# Patient Record
Sex: Female | Born: 1950 | Race: White | Hispanic: No | Smoking: Former smoker
Health system: Southern US, Community
[De-identification: ages and names within clinical notes are randomized; demographics above are authoritative.]

## PROBLEM LIST (undated history)

## (undated) DIAGNOSIS — J45909 Unspecified asthma, uncomplicated: Secondary | ICD-10-CM

## (undated) DIAGNOSIS — R42 Dizziness and giddiness: Secondary | ICD-10-CM

## (undated) DIAGNOSIS — E785 Hyperlipidemia, unspecified: Secondary | ICD-10-CM

## (undated) DIAGNOSIS — Z9289 Personal history of other medical treatment: Secondary | ICD-10-CM

## (undated) DIAGNOSIS — F329 Major depressive disorder, single episode, unspecified: Secondary | ICD-10-CM

## (undated) DIAGNOSIS — J392 Other diseases of pharynx: Secondary | ICD-10-CM

## (undated) DIAGNOSIS — I10 Essential (primary) hypertension: Secondary | ICD-10-CM

## (undated) DIAGNOSIS — M1990 Unspecified osteoarthritis, unspecified site: Secondary | ICD-10-CM

## (undated) DIAGNOSIS — Z8719 Personal history of other diseases of the digestive system: Secondary | ICD-10-CM

## (undated) DIAGNOSIS — F32A Depression, unspecified: Secondary | ICD-10-CM

## (undated) DIAGNOSIS — M25569 Pain in unspecified knee: Secondary | ICD-10-CM

## (undated) DIAGNOSIS — E66813 Obesity, class 3: Secondary | ICD-10-CM

## (undated) DIAGNOSIS — Z8619 Personal history of other infectious and parasitic diseases: Secondary | ICD-10-CM

## (undated) DIAGNOSIS — G4733 Obstructive sleep apnea (adult) (pediatric): Secondary | ICD-10-CM

## (undated) DIAGNOSIS — C541 Malignant neoplasm of endometrium: Secondary | ICD-10-CM

## (undated) DIAGNOSIS — Z8701 Personal history of pneumonia (recurrent): Secondary | ICD-10-CM

## (undated) DIAGNOSIS — M5430 Sciatica, unspecified side: Secondary | ICD-10-CM

## (undated) DIAGNOSIS — M25579 Pain in unspecified ankle and joints of unspecified foot: Secondary | ICD-10-CM

## (undated) DIAGNOSIS — K219 Gastro-esophageal reflux disease without esophagitis: Secondary | ICD-10-CM

## (undated) DIAGNOSIS — C50919 Malignant neoplasm of unspecified site of unspecified female breast: Secondary | ICD-10-CM

## (undated) DIAGNOSIS — J189 Pneumonia, unspecified organism: Secondary | ICD-10-CM

## (undated) DIAGNOSIS — F419 Anxiety disorder, unspecified: Secondary | ICD-10-CM

## (undated) DIAGNOSIS — Z8679 Personal history of other diseases of the circulatory system: Secondary | ICD-10-CM

## (undated) DIAGNOSIS — K759 Inflammatory liver disease, unspecified: Secondary | ICD-10-CM

## (undated) HISTORY — DX: Pain in unspecified knee: M25.569

## (undated) HISTORY — DX: Dizziness and giddiness: R42

## (undated) HISTORY — DX: Personal history of pneumonia (recurrent): Z87.01

## (undated) HISTORY — DX: Malignant neoplasm of endometrium: C54.1

## (undated) HISTORY — DX: Hyperlipidemia, unspecified: E78.5

## (undated) HISTORY — DX: Pain in unspecified ankle and joints of unspecified foot: M25.579

## (undated) HISTORY — DX: Morbid (severe) obesity due to excess calories: E66.01

## (undated) HISTORY — DX: Unspecified osteoarthritis, unspecified site: M19.90

## (undated) HISTORY — DX: Personal history of other infectious and parasitic diseases: Z86.19

## (undated) HISTORY — DX: Obstructive sleep apnea (adult) (pediatric): G47.33

## (undated) HISTORY — PX: COLONOSCOPY: SHX174

## (undated) HISTORY — PX: BREAST LUMPECTOMY: SHX2

## (undated) HISTORY — DX: Essential (primary) hypertension: I10

## (undated) HISTORY — PX: PORT-A-CATH REMOVAL: SHX5289

## (undated) HISTORY — PX: EYE SURGERY: SHX253

## (undated) HISTORY — DX: Malignant neoplasm of unspecified site of unspecified female breast: C50.919

## (undated) HISTORY — PX: PORTA CATH INSERTION: CATH118285

## (undated) HISTORY — DX: Personal history of other diseases of the circulatory system: Z86.79

## (undated) HISTORY — DX: Obesity, class 3: E66.813

---
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
## 1978-07-01 HISTORY — PX: TUBAL LIGATION: SHX77

## 2001-07-01 DIAGNOSIS — Z8719 Personal history of other diseases of the digestive system: Secondary | ICD-10-CM

## 2001-07-01 HISTORY — DX: Personal history of other diseases of the digestive system: Z87.19

## 2001-07-01 HISTORY — PX: HIATAL HERNIA REPAIR: SHX195

## 2004-07-01 HISTORY — PX: TOTAL KNEE ARTHROPLASTY: SHX125

## 2006-07-01 DIAGNOSIS — C50919 Malignant neoplasm of unspecified site of unspecified female breast: Secondary | ICD-10-CM

## 2006-07-01 HISTORY — PX: OTHER SURGICAL HISTORY: SHX169

## 2006-07-01 HISTORY — DX: Malignant neoplasm of unspecified site of unspecified female breast: C50.919

## 2009-07-01 DIAGNOSIS — Z8679 Personal history of other diseases of the circulatory system: Secondary | ICD-10-CM

## 2009-07-01 HISTORY — DX: Personal history of other diseases of the circulatory system: Z86.79

## 2011-08-01 ENCOUNTER — Encounter: Payer: Self-pay | Admitting: Orthopedic Surgery

## 2011-08-01 ENCOUNTER — Encounter (HOSPITAL_COMMUNITY): Payer: Self-pay | Admitting: Pharmacy Technician

## 2011-08-01 ENCOUNTER — Ambulatory Visit (INDEPENDENT_AMBULATORY_CARE_PROVIDER_SITE_OTHER): Admitting: Orthopedic Surgery

## 2011-08-01 ENCOUNTER — Other Ambulatory Visit: Payer: Self-pay | Admitting: *Deleted

## 2011-08-01 VITALS — Ht 66.0 in | Wt 235.0 lb

## 2011-08-01 DIAGNOSIS — M1710 Unilateral primary osteoarthritis, unspecified knee: Secondary | ICD-10-CM

## 2011-08-01 NOTE — Progress Notes (Signed)
Patient ID: Stephanie Jarvis, female   DOB: 24-Sep-1950, 61 y.o.   MRN: [id number] A separate x-ray report  3 views LEFT  Knee pain with arthritis  Alignment shows external rotation of the tibia severe varus deformity  Joint spaces the medial joint space is completely obliterated.  Other findings include subchondral sclerosis, cyst formation.  Osteophytes.  Impression severe osteoarthritis LEFT knee with varus and external rotation deformity and proximal tibia bone loss Subjective:    Stephanie Jarvis is a 61 y.o. female who presents with knee pain involving the left knee. Onset was > 2 years ago. Inciting event: none known. Current symptoms include: crepitus sensation, locking, pain located medial side, popping sensation, stiffness, swelling and catching. Pain is aggravated by any weight bearing, going up and down stairs, kneeling, rising after sitting, squatting, standing and walking. Patient has had prior knee problems. Evaluation to date: none. Treatment to date: Naprosyn, Celebrex and tramadol with decreasing effectiveness.  The following portions of the patient's history were reviewed and updated as appropriate: allergies, current medications, past family history, past medical history, past social history, past surgical history and problem list.   Review of Systems Cardiovascular: positive for palpitations Gastrointestinal: positive for dyspepsia Musculoskeletal:see hpi Behavioral/Psych: positive for anxiety and depression Allergic/Immunologic: seasonal allergies   Objective:    Ht 5\' 6"  (1.676 m)  Wt 235 lb (106.595 kg)  BMI 37.93 kg/m2  Physical Exam(12) GENERAL: normal development   CDV: pulses are normal   Skin: normal  Lymph: nodes were not palpable/normal  Psychiatric: awake, alert and oriented  Neuro: normal sensation  MSK ambulation with a cane Upper extremity exam  Inspection and palpation revealed no abnormalities in the upper extremities.  Range of motion  is full without contracture.  Motor exam is normal with grade 5 strength.  The joints are fully reduced without subluxation.  There is no atrophy or tremor and muscle tone is normal.  All joints are stable.     Right knee: knee flexion 102, alignment normal, no pain tenderness or swelling, strength is normal, knee is stable.  Left knee:  severe deformity, With external rotation and varus malalignment.  There is severe medial joint line tenderness.  Range of motion is 95.  Muscle tone and strength are normal.  Knee is stable.   X-ray LEFT knee shows severe varus deformity with proximal tibial bone loss and external rotation deformity of the tibia.  There is no evidence of any residual medial joint space.  There is osteophytes subchondral sclerosis and cyst formation.  Assessment:    Left knee osteoarthritis with severe medial and external rotation deformity associated with proximal bone loss    Plan:    Natural history and expected course discussed. Questions answered. she would like to proceed with knee replacement surgery.  We have discussed the risks and benefits of the procedure which is somewhat familiar with from her surgery 6 years ago but we reviewed again.  We also discussed the nature of her problem and deformity and difficulty with correction.

## 2011-08-01 NOTE — Patient Instructions (Signed)
You have been scheduled for surgery.  All surgeries carry some risk.  Remember you always have the option of continued nonsurgical treatment. However in this situation the risks vs. the benefits favor surgery as the best treatment option. The risks of the surgery includes the following but is not limited to bleeding, infection, pulmonary embolus, death from anesthesia, nerve injury vascular injury or need for further surgery, continued pain.  Specific to this procedure LEFT TOTAL KNEE REPLACEMENT  the following risks and complications are rare but possible Stiffness Pain  Infection which may require several subsequent surgeries including an amputation of the infection cannot be removed Instability

## 2011-08-02 ENCOUNTER — Encounter (HOSPITAL_COMMUNITY): Admission: RE | Admit: 2011-08-02 | Source: Ambulatory Visit

## 2011-08-06 ENCOUNTER — Other Ambulatory Visit: Payer: Self-pay

## 2011-08-06 ENCOUNTER — Encounter (HOSPITAL_COMMUNITY)
Admission: RE | Admit: 2011-08-06 | Discharge: 2011-08-06 | Disposition: A | Discharge status: Home / Self Care | Source: Ambulatory Visit | Attending: Orthopedic Surgery | Admitting: Orthopedic Surgery

## 2011-08-06 ENCOUNTER — Encounter (HOSPITAL_COMMUNITY): Payer: Self-pay

## 2011-08-06 HISTORY — DX: Gastro-esophageal reflux disease without esophagitis: K21.9

## 2011-08-06 HISTORY — DX: Personal history of other diseases of the digestive system: Z87.19

## 2011-08-06 LAB — CBC
Hemoglobin: 13.2 g/dL (ref 12.0–15.0)
MCHC: 34.2 g/dL (ref 30.0–36.0)
RBC: 4.19 MIL/uL (ref 3.87–5.11)

## 2011-08-06 LAB — BASIC METABOLIC PANEL
GFR calc Af Amer: >90 mL/min (ref >90)
GFR calc non Af Amer: >90 mL/min (ref >90)
Potassium: 4.1 mEq/L (ref 3.5–5.1)
Sodium: 138 mEq/L (ref 135–145)

## 2011-08-06 LAB — DIFFERENTIAL
Basophils Relative: 1 % (ref 0–1)
Eosinophils Absolute: 0.2 10*3/uL (ref 0.0–0.7)
Eosinophils Relative: 3 % (ref 0–5)
Lymphocytes Relative: 27 % (ref 12–46)
Neutrophils Relative %: 61 % (ref 43–77)

## 2011-08-06 LAB — ABO/RH: ABO/RH(D): O POS

## 2011-08-06 LAB — APTT: aPTT: 37 seconds (ref 24–37)

## 2011-08-06 LAB — PREPARE RBC (CROSSMATCH)

## 2011-08-06 LAB — SURGICAL PCR SCREEN: MRSA, PCR: NEGATIVE

## 2011-08-06 MED ORDER — CHLORHEXIDINE GLUCONATE 4 % EX LIQD
60.0000 mL | Freq: Once | CUTANEOUS | Status: DC
  Filled 2011-08-06: qty 60

## 2011-08-06 NOTE — Patient Instructions (Addendum)
20 JAYLEE FREEZE  08/06/2011   Your procedure is scheduled on:   08/12/2011  Report to [facility] at  855  AM.  Call this number if you have problems the morning of surgery: [phone]   Remember:   Do not eat food:After Midnight.  May have clear liquids:until Midnight .  Clear liquids include soda, tea, black coffee, apple or grape juice, broth.  Take these medicines the morning of surgery with A SIP OF WATER: bipropion,fexofenadine, metoprolol,omeprazole,tramdol   Do not wear jewelry, make-up or nail polish.  Do not wear lotions, powders, or perfumes. You may wear deodorant.  Do not shave 48 hours prior to surgery.  Do not bring valuables to the hospital.  Contacts, dentures or bridgework may not be worn into surgery.  Leave suitcase in the car. After surgery it may be brought to your room.  For patients admitted to the hospital, checkout time is 11:00 AM the day of discharge.   Patients discharged the day of surgery will not be allowed to drive home.  Name and phone number of your driver: family  Special Instructions: CHG Shower Use Special Wash: 1/2 bottle night before surgery and 1/2 bottle morning of surgery.   Please read over the following fact sheets that you were given: Pain Booklet, MRSA Information, Surgical Site Infection Prevention, Anesthesia Post-op Instructions and Care and Recovery After Surgery Total Knee Surgery Physical Therapy   Walking - Use a walker or crutches until your doctor says you no longer need them. Your foot should be flat on the floor with no weight/moderate weight/weight tolerated on it.  - Steps: Always go up with your stronger leg first, followed by operated leg and assistive device. To go down: assistive device first, operated leg, then stronger leg.   Positioning  - Do not wear the knee immobilizer splint during the day. To prevent stiffness, it is important to practice your bending exercises frequently (every one to two hours).  - Do not put  a pillow under your knee while in bed or sitting. This would eventually keep your knee from straightening.   Swelling  You can expect swelling in your knee and leg for a few months after surgery. To help reduce or prevent swelling, do the following:  - Elevate your ankle and knee above your heart. Do ankle pumps at least every hour.  - Put ice packs on operated knee. To make an ice pack, fill a plastic bag with ice, wrap a towel around your knee and put ice pack on top of the towel.  - Do not use creams such as Crista Elliot, [address], etc.   Exercise  - Follow exercise instructions given by your physical therapist. Continue working on exercise until you can straighten your knee completely and bend it to at least 90 degrees.  - Swimming may be started as soon as your incision heals. Be sure access into pool is a ramp or steps. Do not go up/down ladder or sit on side of pool.  - A stationary bike may be used 4 weeks after surgery, with no resistance on the pedals.   Activities -- Next Six to Eight Weeks - Do not drive until given permission by your doctor.  - You may shower once incision is healed.  - Avoid high-heeled shoes or slip-on slippers. Instead, wear good walking or tennis shoes.  - Avoid scatter rugs. Put them away while walking on crutches/walker.  - Avoid gaining excessive weight.   Additional  Activities (Six to Eight Weeks After Surgery) - Walking -- excellent exercise to help build strength and general conditioning.  - No tennis, jogging, or other sport that requires a lot of stop-start or jarring. These may loosen the prosthesis.  - Golf -- may begin after three months  - Swimming -- for general conditioning and endurance   PATIENT INSTRUCTIONS POST-ANESTHESIA  IMMEDIATELY FOLLOWING SURGERY:  Do not drive or operate machinery for the first twenty four hours after surgery.  Do not make any important decisions for twenty four hours after surgery or while taking narcotic pain  medications or sedatives.  If you develop intractable nausea and vomiting or a severe headache please notify your doctor immediately.  FOLLOW-UP:  Please make an appointment with your surgeon as instructed. You do not need to follow up with anesthesia unless specifically instructed to do so.  WOUND CARE INSTRUCTIONS (if applicable):  Keep a dry clean dressing on the anesthesia/puncture wound site if there is drainage.  Once the wound has quit draining you may leave it open to air.  Generally you should leave the bandage intact for twenty four hours unless there is drainage.  If the epidural site drains for more than 36-48 hours please call the anesthesia department.  QUESTIONS?:  Please feel free to call your physician or the hospital operator if you have any questions, and they will be happy to assist you.     [facility] Anesthesia Department [address] [place] [phone]

## 2011-08-08 ENCOUNTER — Telehealth: Payer: Self-pay | Admitting: Orthopedic Surgery

## 2011-08-08 NOTE — Telephone Encounter (Addendum)
RE: In-patient surgery/admit date 08/12/11 for CPT 27447 total knee arthroplasty, ICD9 715.96, 715.16 as per [employer]'s request - Faxed in-patient authorization form to [phone]. Also completed on-line authorization, assisted by Darl Pikes at ph# [phone].  Rec'd: Date: 08/08/2011 Tracking Number: [id number]

## 2011-08-09 NOTE — Telephone Encounter (Signed)
Per phone with [employer] case manager, Adela L, NO pre-authorization is required for this in-patient procedure (CPT [id number]), for patient's [employer] Standard plan.  We will receive an approval via fax, due to the entering of the online auth request.

## 2011-08-09 NOTE — Telephone Encounter (Signed)
Rec'd Faxed approval from [employer]  per previous note:  REF# [id number].

## 2011-08-11 NOTE — H&P (Addendum)
  Subjective:   Stephanie Jarvis is a 61 y.o. female who presents with knee pain involving the left knee. Onset was > 2 years ago. Inciting event: none known. Current symptoms include: crepitus sensation, locking, pain located medial side, popping sensation, stiffness, swelling and catching. Pain is aggravated by any weight bearing, going up and down stairs, kneeling, rising after sitting, squatting, standing and walking. Patient has had prior knee problems. Evaluation to date: none. Treatment to date: Naprosyn, Celebrex and tramadol with decreasing effectiveness.  The following portions of the patient's history were reviewed and updated as appropriate: allergies, current medications, past family history, past medical history, past social history, past surgical history and problem list.  Review of Systems  Cardiovascular: positive for palpitations  Gastrointestinal: positive for dyspepsia  Musculoskeletal:see hpi  Behavioral/Psych: positive for anxiety and depression  Allergic/Immunologic: seasonal allergies  Objective:   Ht 5\' 6"  (1.676 m)  Wt 235 lb (106.595 kg)  BMI 37.93 kg/m2  Physical Exam(12)  GENERAL: normal development  CDV: pulses are normal  Skin: normal  Lymph: nodes were not palpable/normal  Psychiatric: awake, alert and oriented  Neuro: normal sensation  MSK ambulation with a cane  Upper extremity exam  Inspection and palpation revealed no abnormalities in the upper extremities. Range of motion is full without contracture.  Motor exam is normal with grade 5 strength.  The joints are fully reduced without subluxation.  There is no atrophy or tremor and muscle tone is normal. All joints are stable.  Right knee:  knee flexion 102, alignment normal, no pain tenderness or swelling, strength is normal, knee is stable.   Left knee:  severe deformity, With external rotation and varus malalignment. There is severe medial joint line tenderness. Range of motion is 95. Muscle tone and  strength are normal. Knee is stable.   X-ray LEFT knee shows severe varus deformity with proximal tibial bone loss and external rotation deformity of the tibia. There is no evidence of any residual medial joint space. There is osteophytes subchondral sclerosis and cyst formation.  Assessment:   Left knee osteoarthritis with severe medial and external rotation deformity associated with proximal bone loss  Plan:   Natural history and expected course discussed. Questions answered.  she would like to proceed with LEFT knee replacement surgery. We have discussed the risks and benefits of the procedure which is somewhat familiar with from her surgery 6 years ago but we reviewed again. We also discussed the nature of her problem and deformity and difficulty with correction.

## 2011-08-12 ENCOUNTER — Encounter (HOSPITAL_COMMUNITY): Payer: Self-pay | Admitting: Anesthesiology

## 2011-08-12 ENCOUNTER — Inpatient Hospital Stay (HOSPITAL_COMMUNITY)
Admission: RE | Admit: 2011-08-12 | Discharge: 2011-08-15 | DRG: 470 | Disposition: A | Discharge status: Home Health | Source: Ambulatory Visit | Attending: Orthopedic Surgery | Admitting: Orthopedic Surgery

## 2011-08-12 ENCOUNTER — Inpatient Hospital Stay (HOSPITAL_COMMUNITY)

## 2011-08-12 ENCOUNTER — Encounter (HOSPITAL_COMMUNITY)
Admission: RE | Disposition: A | Discharge status: Home Health | Payer: Self-pay | Source: Ambulatory Visit | Attending: Orthopedic Surgery

## 2011-08-12 ENCOUNTER — Encounter (HOSPITAL_COMMUNITY): Payer: Self-pay | Admitting: *Deleted

## 2011-08-12 ENCOUNTER — Inpatient Hospital Stay (HOSPITAL_COMMUNITY): Admitting: Anesthesiology

## 2011-08-12 DIAGNOSIS — R1013 Epigastric pain: Secondary | ICD-10-CM | POA: Diagnosis present

## 2011-08-12 DIAGNOSIS — M1710 Unilateral primary osteoarthritis, unspecified knee: Secondary | ICD-10-CM

## 2011-08-12 DIAGNOSIS — K3189 Other diseases of stomach and duodenum: Secondary | ICD-10-CM | POA: Diagnosis present

## 2011-08-12 DIAGNOSIS — Z01812 Encounter for preprocedural laboratory examination: Secondary | ICD-10-CM

## 2011-08-12 DIAGNOSIS — F341 Dysthymic disorder: Secondary | ICD-10-CM | POA: Diagnosis present

## 2011-08-12 HISTORY — PX: TOTAL KNEE ARTHROPLASTY: SHX125

## 2011-08-12 SURGERY — ARTHROPLASTY, KNEE, TOTAL
Anesthesia: Spinal | Site: Knee | Laterality: Left | Wound class: Clean

## 2011-08-12 MED ORDER — METOCLOPRAMIDE HCL 10 MG PO TABS: 5.0000 mg | ORAL_TABLET | Freq: Three times a day (TID) | ORAL | Status: DC | PRN

## 2011-08-12 MED ORDER — MAGNESIUM CITRATE PO SOLN: 1.0000 | Freq: Once | ORAL | Status: AC | PRN

## 2011-08-12 MED ORDER — ONDANSETRON HCL 4 MG/2ML IJ SOLN: 4.0000 mg | Freq: Once | INTRAMUSCULAR | Status: DC | PRN

## 2011-08-12 MED ORDER — PROPOFOL 10 MG/ML IV EMUL
INTRAVENOUS | Status: AC
  Filled 2011-08-12: qty 20

## 2011-08-12 MED ORDER — CELECOXIB 100 MG PO CAPS
400.0000 mg | ORAL_CAPSULE | Freq: Once | ORAL | Status: AC
  Administered 2011-08-12: 400 mg via ORAL

## 2011-08-12 MED ORDER — CEFAZOLIN SODIUM-DEXTROSE 2-3 GM-% IV SOLR
2.0000 g | Freq: Four times a day (QID) | INTRAVENOUS | Status: AC
  Administered 2011-08-12 – 2011-08-13 (×3): 2 g via INTRAVENOUS
  Filled 2011-08-12 (×4): qty 50

## 2011-08-12 MED ORDER — PHENOL 1.4 % MT LIQD: 1.0000 | OROMUCOSAL | Status: DC | PRN

## 2011-08-12 MED ORDER — SODIUM CHLORIDE 0.9 % IV SOLN
INTRAVENOUS | Status: DC
  Administered 2011-08-12 – 2011-08-14 (×3): via INTRAVENOUS

## 2011-08-12 MED ORDER — LORATADINE 10 MG PO TABS
10.0000 mg | ORAL_TABLET | Freq: Every day | ORAL | Status: DC
  Administered 2011-08-13 – 2011-08-15 (×3): 10 mg via ORAL
  Filled 2011-08-12 (×3): qty 1

## 2011-08-12 MED ORDER — METOPROLOL TARTRATE 25 MG PO TABS
12.5000 mg | ORAL_TABLET | Freq: Two times a day (BID) | ORAL | Status: DC
  Administered 2011-08-12 – 2011-08-15 (×6): 12.5 mg via ORAL
  Filled 2011-08-12 (×2): qty 1
  Filled 2011-08-12: qty 2
  Filled 2011-08-12 (×3): qty 1

## 2011-08-12 MED ORDER — METHOCARBAMOL 100 MG/ML IJ SOLN
500.0000 mg | Freq: Once | INTRAVENOUS | Status: AC
  Administered 2011-08-12: 500 mg via INTRAVENOUS
  Filled 2011-08-12: qty 5

## 2011-08-12 MED ORDER — FENTANYL CITRATE 0.05 MG/ML IJ SOLN
INTRAMUSCULAR | Status: DC | PRN
  Administered 2011-08-12: 50 ug via INTRAVENOUS
  Administered 2011-08-12 (×2): 25 ug via INTRAVENOUS
  Administered 2011-08-12: 50 ug via INTRAVENOUS
  Administered 2011-08-12: 30 ug via INTRAVENOUS

## 2011-08-12 MED ORDER — BUPIVACAINE 0.25 % ON-Q PUMP SINGLE CATH 300ML
INJECTION | Status: DC | PRN
  Administered 2011-08-12: 270 mL

## 2011-08-12 MED ORDER — BUPIVACAINE-EPINEPHRINE (PF) 0.5% -1:200000 IJ SOLN
INTRAMUSCULAR | Status: DC | PRN
  Administered 2011-08-12: 60 mL

## 2011-08-12 MED ORDER — ONDANSETRON HCL 4 MG PO TABS
4.0000 mg | ORAL_TABLET | Freq: Four times a day (QID) | ORAL | Status: DC | PRN
  Filled 2011-08-12: qty 1

## 2011-08-12 MED ORDER — ACETAMINOPHEN 10 MG/ML IV SOLN
1000.0000 mg | Freq: Once | INTRAVENOUS | Status: AC
  Administered 2011-08-12: 1000 mg via INTRAVENOUS

## 2011-08-12 MED ORDER — SENNOSIDES-DOCUSATE SODIUM 8.6-50 MG PO TABS: 1.0000 | ORAL_TABLET | Freq: Every evening | ORAL | Status: DC | PRN

## 2011-08-12 MED ORDER — METHOCARBAMOL 500 MG PO TABS
500.0000 mg | ORAL_TABLET | Freq: Four times a day (QID) | ORAL | Status: DC | PRN
  Administered 2011-08-12 – 2011-08-14 (×4): 500 mg via ORAL
  Filled 2011-08-12 (×4): qty 1

## 2011-08-12 MED ORDER — FENTANYL CITRATE 0.05 MG/ML IJ SOLN: 25.0000 ug | INTRAMUSCULAR | Status: DC | PRN

## 2011-08-12 MED ORDER — CEFAZOLIN SODIUM 1-5 GM-% IV SOLN
INTRAVENOUS | Status: DC | PRN
  Administered 2011-08-12: 2 g via INTRAVENOUS

## 2011-08-12 MED ORDER — LACTATED RINGERS IV SOLN
INTRAVENOUS | Status: DC
  Administered 2011-08-12: 1000 mL via INTRAVENOUS

## 2011-08-12 MED ORDER — OXYCODONE HCL 5 MG PO TABS
5.0000 mg | ORAL_TABLET | Freq: Once | ORAL | Status: AC
  Administered 2011-08-12: 5 mg via ORAL

## 2011-08-12 MED ORDER — FENTANYL CITRATE 0.05 MG/ML IJ SOLN
INTRAMUSCULAR | Status: AC
  Filled 2011-08-12: qty 2

## 2011-08-12 MED ORDER — SENNA 8.6 MG PO TABS
1.0000 | ORAL_TABLET | Freq: Two times a day (BID) | ORAL | Status: DC
  Administered 2011-08-12 – 2011-08-15 (×7): 8.6 mg via ORAL
  Filled 2011-08-12 (×7): qty 1

## 2011-08-12 MED ORDER — CELECOXIB 100 MG PO CAPS
200.0000 mg | ORAL_CAPSULE | Freq: Every day | ORAL | Status: DC
  Administered 2011-08-13 – 2011-08-15 (×3): 200 mg via ORAL
  Filled 2011-08-12 (×4): qty 2

## 2011-08-12 MED ORDER — MIDAZOLAM HCL 2 MG/2ML IJ SOLN
INTRAMUSCULAR | Status: AC
  Administered 2011-08-12: 2 mg via INTRAVENOUS
  Filled 2011-08-12: qty 2

## 2011-08-12 MED ORDER — ACETAMINOPHEN 650 MG RE SUPP
650.0000 mg | Freq: Four times a day (QID) | RECTAL | Status: DC | PRN
  Filled 2011-08-12: qty 1

## 2011-08-12 MED ORDER — BUPIVACAINE IN DEXTROSE 0.75-8.25 % IT SOLN
INTRATHECAL | Status: AC
  Filled 2011-08-12: qty 2

## 2011-08-12 MED ORDER — LACTATED RINGERS IV SOLN
INTRAVENOUS | Status: DC | PRN
  Administered 2011-08-12 (×2): via INTRAVENOUS

## 2011-08-12 MED ORDER — PROPOFOL 10 MG/ML IV EMUL
INTRAVENOUS | Status: DC | PRN
  Administered 2011-08-12: 50 ug/kg/min via INTRAVENOUS

## 2011-08-12 MED ORDER — VITAMIN D3 25 MCG (1000 UNIT) PO TABS
1000.0000 [IU] | ORAL_TABLET | Freq: Every day | ORAL | Status: DC
  Administered 2011-08-12 – 2011-08-15 (×4): 1000 [IU] via ORAL
  Filled 2011-08-12 (×7): qty 1

## 2011-08-12 MED ORDER — MIDAZOLAM HCL 5 MG/5ML IJ SOLN
INTRAMUSCULAR | Status: DC | PRN
  Administered 2011-08-12: 1.5 mg via INTRAVENOUS
  Administered 2011-08-12: 0.5 mg via INTRAVENOUS

## 2011-08-12 MED ORDER — LIDOCAINE HCL (PF) 1 % IJ SOLN
INTRAMUSCULAR | Status: AC
  Filled 2011-08-12: qty 5

## 2011-08-12 MED ORDER — ACETAMINOPHEN 10 MG/ML IV SOLN
1000.0000 mg | Freq: Four times a day (QID) | INTRAVENOUS | Status: AC
  Administered 2011-08-12 – 2011-08-13 (×4): 1000 mg via INTRAVENOUS
  Filled 2011-08-12 (×4): qty 100

## 2011-08-12 MED ORDER — DIPHENHYDRAMINE HCL 12.5 MG/5ML PO ELIX: 12.5000 mg | ORAL_SOLUTION | ORAL | Status: DC | PRN

## 2011-08-12 MED ORDER — ACETAMINOPHEN 10 MG/ML IV SOLN
INTRAVENOUS | Status: AC
  Administered 2011-08-12: 1000 mg via INTRAVENOUS
  Filled 2011-08-12: qty 100

## 2011-08-12 MED ORDER — ONDANSETRON HCL 4 MG/2ML IJ SOLN: 4.0000 mg | Freq: Four times a day (QID) | INTRAMUSCULAR | Status: DC | PRN

## 2011-08-12 MED ORDER — SODIUM CHLORIDE 0.9 % IR SOLN
Status: DC | PRN
  Administered 2011-08-12: 1000 mL
  Administered 2011-08-12: 3000 mL

## 2011-08-12 MED ORDER — MIDAZOLAM HCL 2 MG/2ML IJ SOLN
INTRAMUSCULAR | Status: AC
  Filled 2011-08-12: qty 2

## 2011-08-12 MED ORDER — METOCLOPRAMIDE HCL 5 MG/ML IJ SOLN: 5.0000 mg | Freq: Three times a day (TID) | INTRAMUSCULAR | Status: DC | PRN

## 2011-08-12 MED ORDER — ONDANSETRON HCL 4 MG/2ML IJ SOLN
INTRAMUSCULAR | Status: AC
  Administered 2011-08-12: 4 mg via INTRAVENOUS
  Filled 2011-08-12: qty 2

## 2011-08-12 MED ORDER — BISACODYL 5 MG PO TBEC: 5.0000 mg | DELAYED_RELEASE_TABLET | Freq: Every day | ORAL | Status: DC | PRN

## 2011-08-12 MED ORDER — BUPIVACAINE HCL (PF) 0.25 % IJ SOLN
INTRAMUSCULAR | Status: AC
  Filled 2011-08-12: qty 90

## 2011-08-12 MED ORDER — CELECOXIB 100 MG PO CAPS
ORAL_CAPSULE | ORAL | Status: AC
  Administered 2011-08-12: 400 mg via ORAL
  Filled 2011-08-12: qty 4

## 2011-08-12 MED ORDER — MIDAZOLAM HCL 2 MG/2ML IJ SOLN
1.0000 mg | INTRAMUSCULAR | Status: DC | PRN
  Administered 2011-08-12: 2 mg via INTRAVENOUS

## 2011-08-12 MED ORDER — CEFAZOLIN SODIUM-DEXTROSE 2-3 GM-% IV SOLR: 2.0000 g | INTRAVENOUS | Status: DC

## 2011-08-12 MED ORDER — CEFAZOLIN SODIUM 1-5 GM-% IV SOLN
INTRAVENOUS | Status: AC
  Filled 2011-08-12: qty 100

## 2011-08-12 MED ORDER — BUPROPION HCL ER (XL) 300 MG PO TB24
300.0000 mg | ORAL_TABLET | Freq: Every day | ORAL | Status: DC
  Administered 2011-08-13 – 2011-08-15 (×3): 300 mg via ORAL
  Filled 2011-08-12 (×5): qty 1

## 2011-08-12 MED ORDER — FENTANYL CITRATE 0.05 MG/ML IJ SOLN
INTRAMUSCULAR | Status: DC | PRN
  Administered 2011-08-12: 20 ug via INTRATHECAL

## 2011-08-12 MED ORDER — FLUOCINONIDE 0.05 % EX CREA
1.0000 "application " | TOPICAL_CREAM | Freq: Every day | CUTANEOUS | Status: DC | PRN
  Filled 2011-08-12: qty 30

## 2011-08-12 MED ORDER — PANTOPRAZOLE SODIUM 40 MG PO TBEC
40.0000 mg | DELAYED_RELEASE_TABLET | Freq: Every day | ORAL | Status: DC
  Administered 2011-08-13 – 2011-08-15 (×3): 40 mg via ORAL
  Filled 2011-08-12 (×3): qty 1

## 2011-08-12 MED ORDER — BUPIVACAINE HCL (PF) 0.25 % IJ SOLN
INTRAMUSCULAR | Status: AC
  Filled 2011-08-12: qty 180

## 2011-08-12 MED ORDER — ACETAMINOPHEN 325 MG PO TABS: 650.0000 mg | ORAL_TABLET | Freq: Four times a day (QID) | ORAL | Status: DC | PRN

## 2011-08-12 MED ORDER — MENTHOL 3 MG MT LOZG: 1.0000 | LOZENGE | OROMUCOSAL | Status: DC | PRN

## 2011-08-12 MED ORDER — OXYCODONE HCL 5 MG PO TABS
5.0000 mg | ORAL_TABLET | ORAL | Status: DC
  Administered 2011-08-12 – 2011-08-14 (×12): 5 mg via ORAL
  Filled 2011-08-12 (×12): qty 1

## 2011-08-12 MED ORDER — BUPIVACAINE HCL 0.75 % IJ SOLN
INTRAMUSCULAR | Status: DC | PRN
  Administered 2011-08-12: 1.5 mL via INTRATHECAL

## 2011-08-12 MED ORDER — NAPROXEN 250 MG PO TABS
250.0000 mg | ORAL_TABLET | Freq: Two times a day (BID) | ORAL | Status: DC
  Administered 2011-08-12 – 2011-08-13 (×3): 250 mg via ORAL
  Filled 2011-08-12 (×3): qty 1

## 2011-08-12 MED ORDER — CEFAZOLIN SODIUM-DEXTROSE 2-3 GM-% IV SOLR
INTRAVENOUS | Status: AC
  Filled 2011-08-12: qty 50

## 2011-08-12 MED ORDER — DOCUSATE SODIUM 100 MG PO CAPS
100.0000 mg | ORAL_CAPSULE | Freq: Two times a day (BID) | ORAL | Status: DC
  Administered 2011-08-12 – 2011-08-15 (×7): 100 mg via ORAL
  Filled 2011-08-12 (×7): qty 1

## 2011-08-12 MED ORDER — ASPIRIN EC 325 MG PO TBEC
325.0000 mg | DELAYED_RELEASE_TABLET | Freq: Every day | ORAL | Status: DC
  Administered 2011-08-13 – 2011-08-15 (×3): 325 mg via ORAL
  Filled 2011-08-12 (×4): qty 1

## 2011-08-12 MED ORDER — METHOCARBAMOL 100 MG/ML IJ SOLN
500.0000 mg | Freq: Four times a day (QID) | INTRAVENOUS | Status: DC | PRN
  Filled 2011-08-12: qty 5

## 2011-08-12 MED ORDER — LIDOCAINE HCL (CARDIAC) 10 MG/ML IV SOLN
INTRAVENOUS | Status: DC | PRN
  Administered 2011-08-12: 25 mg via INTRAVENOUS

## 2011-08-12 MED ORDER — BUPIVACAINE-EPINEPHRINE PF 0.5-1:200000 % IJ SOLN
INTRAMUSCULAR | Status: AC
  Filled 2011-08-12: qty 20

## 2011-08-12 MED ORDER — EPHEDRINE SULFATE 50 MG/ML IJ SOLN
INTRAMUSCULAR | Status: DC | PRN
  Administered 2011-08-12 (×3): 10 mg via INTRAVENOUS

## 2011-08-12 MED ORDER — ROSUVASTATIN CALCIUM 5 MG PO TABS
10.0000 mg | ORAL_TABLET | Freq: Every day | ORAL | Status: DC
  Administered 2011-08-12 – 2011-08-15 (×4): 10 mg via ORAL
  Filled 2011-08-12: qty 1
  Filled 2011-08-12 (×2): qty 2
  Filled 2011-08-12: qty 1
  Filled 2011-08-12: qty 2

## 2011-08-12 MED ORDER — ONDANSETRON HCL 4 MG/2ML IJ SOLN
4.0000 mg | Freq: Once | INTRAMUSCULAR | Status: AC
  Administered 2011-08-12: 4 mg via INTRAVENOUS

## 2011-08-12 SURGICAL SUPPLY — 75 items
BAG HAMPER (MISCELLANEOUS) ×2 IMPLANT
BANDAGE ELASTIC 4 VELCRO NS (GAUZE/BANDAGES/DRESSINGS) ×2 IMPLANT
BANDAGE ELASTIC 6 VELCRO NS (GAUZE/BANDAGES/DRESSINGS) ×4 IMPLANT
BANDAGE ESMARK 6X9 LF (GAUZE/BANDAGES/DRESSINGS) ×1 IMPLANT
BIT DRILL 3.2X128 (BIT) IMPLANT
BLADE HEX COATED 2.75 (ELECTRODE) ×2 IMPLANT
BLADE SAG 18X100X1.27 (BLADE) ×2 IMPLANT
BLADE SAGITTAL 25.0X1.27X90 (BLADE) ×2 IMPLANT
BLADE SAW SAG 90X13X1.27 (BLADE) ×2 IMPLANT
BNDG ESMARK 6X9 LF (GAUZE/BANDAGES/DRESSINGS) ×2
BOWL SMART MIX CTS (DISPOSABLE) IMPLANT
CATH KIT ON Q 2.5IN SLV (PAIN MANAGEMENT) ×2 IMPLANT
CEMENT HV SMART SET (Cement) ×4 IMPLANT
CLOTH BEACON ORANGE TIMEOUT ST (SAFETY) ×2 IMPLANT
COOLER CRYO CUFF IC AND MOTOR (MISCELLANEOUS) ×2 IMPLANT
COVER LIGHT HANDLE STERIS (MISCELLANEOUS) ×4 IMPLANT
COVER PROBE W GEL 5X96 (DRAPES) ×2 IMPLANT
CUFF CRYO KNEE LG 20X31 COOLER (ORTHOPEDIC SUPPLIES) ×2 IMPLANT
CUFF CRYO KNEE18X23 MED (MISCELLANEOUS) IMPLANT
CUFF TOURNIQUET SINGLE 34IN LL (TOURNIQUET CUFF) IMPLANT
CUFF TOURNIQUET SINGLE 44IN (TOURNIQUET CUFF) ×2 IMPLANT
DECANTER SPIKE VIAL GLASS SM (MISCELLANEOUS) ×4 IMPLANT
DRAPE BACK TABLE (DRAPES) ×2 IMPLANT
DRAPE EXTREMITY T 121X128X90 (DRAPE) ×2 IMPLANT
DRAPE U-SHAPE 47X51 STRL (DRAPES) ×2 IMPLANT
DRSG MEPILEX BORDER 4X12 (GAUZE/BANDAGES/DRESSINGS) ×2 IMPLANT
DURAPREP 26ML APPLICATOR (WOUND CARE) ×2 IMPLANT
ELECT REM PT RETURN 9FT ADLT (ELECTROSURGICAL) ×2
ELECTRODE REM PT RTRN 9FT ADLT (ELECTROSURGICAL) ×1 IMPLANT
FACESHIELD LNG OPTICON STERILE (SAFETY) ×2 IMPLANT
GLOVE BIOGEL PI IND STRL 7.0 (GLOVE) ×2 IMPLANT
GLOVE BIOGEL PI IND STRL 7.5 (GLOVE) ×1 IMPLANT
GLOVE BIOGEL PI IND STRL 8 (GLOVE) ×1 IMPLANT
GLOVE BIOGEL PI INDICATOR 7.0 (GLOVE) ×2
GLOVE BIOGEL PI INDICATOR 7.5 (GLOVE) ×1
GLOVE BIOGEL PI INDICATOR 8 (GLOVE) ×1
GLOVE ECLIPSE 6.5 STRL STRAW (GLOVE) ×2 IMPLANT
GLOVE ECLIPSE 7.0 STRL STRAW (GLOVE) ×2 IMPLANT
GLOVE ECLIPSE 8.0 STRL XLNG CF (GLOVE) ×2 IMPLANT
GLOVE OPTIFIT SS 8.0 STRL (GLOVE) ×2 IMPLANT
GLOVE SKINSENSE NS SZ8.0 LF (GLOVE) ×3
GLOVE SKINSENSE STRL SZ8.0 LF (GLOVE) ×3 IMPLANT
GLOVE SS BIOGEL STRL SZ 8 (GLOVE) ×1 IMPLANT
GLOVE SS N UNI LF 8.5 STRL (GLOVE) ×2 IMPLANT
GLOVE SUPERSENSE BIOGEL SZ 8 (GLOVE) ×1
GOWN STRL REIN XL XLG (GOWN DISPOSABLE) ×8 IMPLANT
HANDPIECE INTERPULSE COAX TIP (DISPOSABLE) ×1
HOOD W/PEELAWAY (MISCELLANEOUS) ×10 IMPLANT
INST SET MAJOR BONE (KITS) ×2 IMPLANT
IV NS IRRIG 3000ML ARTHROMATIC (IV SOLUTION) ×2 IMPLANT
KIT BLADEGUARD II DBL (SET/KITS/TRAYS/PACK) ×2 IMPLANT
KIT ROOM TURNOVER APOR (KITS) ×2 IMPLANT
MANIFOLD NEPTUNE II (INSTRUMENTS) ×2 IMPLANT
MARKER SKIN DUAL TIP RULER LAB (MISCELLANEOUS) ×2 IMPLANT
NEEDLE HYPO 21X1.5 SAFETY (NEEDLE) ×2 IMPLANT
NS IRRIG 1000ML POUR BTL (IV SOLUTION) ×2 IMPLANT
PACK TOTAL JOINT (CUSTOM PROCEDURE TRAY) ×2 IMPLANT
PAD ARMBOARD 7.5X6 YLW CONV (MISCELLANEOUS) ×2 IMPLANT
PAD DANNIFLEX CPM (ORTHOPEDIC SUPPLIES) ×2 IMPLANT
PIN TROCAR 3 INCH (PIN) ×2 IMPLANT
PUMP ON Q 270MLX5ML/HR (PAIN MANAGEMENT) ×2 IMPLANT
SET BASIN LINEN APH (SET/KITS/TRAYS/PACK) ×2 IMPLANT
SET HNDPC FAN SPRY TIP SCT (DISPOSABLE) ×1 IMPLANT
SPONGE GAUZE 4X4 12PLY (GAUZE/BANDAGES/DRESSINGS) IMPLANT
STAPLER VISISTAT 35W (STAPLE) ×2 IMPLANT
SUT BRALON NAB BRD #1 30IN (SUTURE) ×4 IMPLANT
SUT MON AB 0 CT1 (SUTURE) ×2 IMPLANT
SUT MON AB 2-0 CT1 36 (SUTURE) ×2 IMPLANT
SYR 30ML LL (SYRINGE) ×2 IMPLANT
SYR BULB IRRIGATION 50ML (SYRINGE) ×2 IMPLANT
TOWEL OR 17X26 4PK STRL BLUE (TOWEL DISPOSABLE) ×2 IMPLANT
TOWER CARTRIDGE SMART MIX (DISPOSABLE) ×2 IMPLANT
TRAY FOLEY CATH 14FR (SET/KITS/TRAYS/PACK) ×2 IMPLANT
WATER STERILE IRR 1000ML POUR (IV SOLUTION) ×8 IMPLANT
YANKAUER SUCT 12FT TUBE ARGYLE (SUCTIONS) ×2 IMPLANT

## 2011-08-12 NOTE — Brief Op Note (Addendum)
08/12/2011  1:17 PM  PATIENT:  Stephanie Jarvis  61 y.o. female  PRE-OPERATIVE DIAGNOSIS:  Left knee osteoarthritis  POST-OPERATIVE DIAGNOSIS:  Left knee osteoarthritis  PROCEDURE:  Procedure(s): LEFT TOTAL KNEE ARTHROPLASTY  DEPUY SIGMA PS FB  65F  3T  35 P   10 POLY   SURGEON:  Surgeon(s): Fuller Canada, MD  PHYSICIAN ASSISTANT:   ASSISTANTS: W. MCFATTER AND RON HARRIS   ANESTHESIA:   spinal  EBL:  Total I/O In: 1300 [I.V.:1300] Out: 250 [Urine:250]  BLOOD ADMINISTERED:none  DRAINS: PAIN PUMP CATHETER   LOCAL MEDICATIONS USED:  MARCAINE   WITH EPI   SPECIMEN:  No Specimen  DISPOSITION OF SPECIMEN:  N/A  COUNTS:  YES  TOURNIQUET:   Total Tourniquet Time Documented: Thigh (Left) - 96 minutes  DICTATION: .[name] Dictation  PLAN OF CARE: Admit to inpatient   PATIENT DISPOSITION:  PACU - hemodynamically stable.   Delay start of Pharmacological VTE agent (>24hrs) due to surgical blood loss or risk of bleeding: yes

## 2011-08-12 NOTE — Anesthesia Postprocedure Evaluation (Signed)
  Anesthesia Post-op Note  Patient: Stephanie Jarvis  Procedure(s) Performed:  TOTAL KNEE ARTHROPLASTY - DePuy  Patient Location: PACU  Anesthesia Type: Spinal  Level of Consciousness: awake, alert , oriented and patient cooperative  Airway and Oxygen Therapy: Patient Spontanous Breathing  Post-op Pain: none  Post-op Assessment: Post-op Vital signs reviewed, Patient's Cardiovascular Status Stable, Respiratory Function Stable, Patent Airway and No signs of Nausea or vomiting  Post-op Vital Signs: Reviewed and stable  Complications: No apparent anesthesia complications

## 2011-08-12 NOTE — Transfer of Care (Signed)
Immediate Anesthesia Transfer of Care Note  Patient: Stephanie Jarvis  Procedure(s) Performed:  TOTAL KNEE ARTHROPLASTY - DePuy  Patient Location: PACU  Anesthesia Type: Spinal  Level of Consciousness: awake, alert , oriented and patient cooperative  Airway & Oxygen Therapy: Patient Spontanous Breathing  Post-op Assessment: Report given to PACU RN and Post -op Vital signs reviewed and stable  Post vital signs: Reviewed and stable  Complications: No apparent anesthesia complications

## 2011-08-12 NOTE — Preoperative (Signed)
Beta Blockers   Reason not to administer Beta Blockers:Not Applicable 

## 2011-08-12 NOTE — Anesthesia Preprocedure Evaluation (Addendum)
Anesthesia Evaluation  Patient identified by MRN, date of birth, ID band Patient awake    Reviewed: Allergy & Precautions, H&P , NPO status   History of Anesthesia Complications Negative for: history of anesthetic complications  Airway Mallampati: III      Dental  (+) Teeth Intact   Pulmonary neg pulmonary ROS,  clear to auscultation        Cardiovascular + dysrhythmias (cathed 04/2011 = neg) Atrial Fibrillation Regular Normal    Neuro/Psych    GI/Hepatic hiatal hernia (corrected by Nissen Fundoplication), GERD-  Medicated and Controlled,(+) Hepatitis - (1968), A  Endo/Other    Renal/GU      Musculoskeletal  (+) Arthritis - (L knee),   Abdominal   Peds  Hematology   Anesthesia Other Findings   Reproductive/Obstetrics                           Anesthesia Physical Anesthesia Plan  ASA: III  Anesthesia Plan: Spinal   Post-op Pain Management:    Induction:   Airway Management Planned: Nasal Cannula  Additional Equipment:   Intra-op Plan:   Post-operative Plan:   Informed Consent: I have reviewed the patients History and Physical, chart, labs and discussed the procedure including the risks, benefits and alternatives for the proposed anesthesia with the patient or authorized representative who has indicated his/her understanding and acceptance.     Plan Discussed with:   Anesthesia Plan Comments:         Anesthesia Quick Evaluation

## 2011-08-12 NOTE — Anesthesia Procedure Notes (Signed)
Spinal  Patient location during procedure: OR Start time: 08/12/2011 11:09 AM Staffing CRNA/Resident: ANDRAZA, AMY Preanesthetic Checklist Completed: patient identified, site marked, surgical consent, pre-op evaluation, timeout performed, IV checked, risks and benefits discussed and monitors and equipment checked Spinal Block Patient position: left lateral decubitus Prep: Betadine Patient monitoring: heart rate, cardiac monitor and continuous pulse ox Approach: left paramedian Location: L3-4 Injection technique: single-shot Needle Needle type: Spinocan  Needle gauge: 22 G Needle length: 9 cm Assessment Sensory level: T8 Additional Notes Bupivacaine .75% 1.5 cc with epi .1 and Fentanyl 20 mcg intrathecally  Lot [id number] Exp 05-2012

## 2011-08-12 NOTE — Interval H&P Note (Signed)
History and Physical Interval Note:  08/12/2011 10:45 AM  Stephanie Jarvis  has presented today for surgery, with the diagnosis of Left knee osteoarthritis  The various methods of treatment have been discussed with the patient and family. After consideration of risks, benefits and other options for treatment, the patient has consented to  Procedure(s): LEFT  TOTAL KNEE ARTHROPLASTY as a surgical intervention .  The patients' history has been reviewed, patient examined, no change in status, stable for surgery.  I have reviewed the patients' chart and labs.  Questions were answered to the patient's satisfaction.     Fuller Canada

## 2011-08-13 LAB — BASIC METABOLIC PANEL
CO2: 30 mEq/L (ref 19–32)
Chloride: 103 mEq/L (ref 96–112)
GFR calc non Af Amer: >90 mL/min (ref >90)
Glucose, Bld: 115 mg/dL — ABNORMAL HIGH (ref 70–99)
Potassium: 4 mEq/L (ref 3.5–5.1)
Sodium: 139 mEq/L (ref 135–145)

## 2011-08-13 LAB — CBC
Hemoglobin: 10.5 g/dL — ABNORMAL LOW (ref 12.0–15.0)
RBC: 3.33 MIL/uL — ABNORMAL LOW (ref 3.87–5.11)
WBC: 8.5 10*3/uL (ref 4.0–10.5)

## 2011-08-13 MED ORDER — HYDROMORPHONE HCL PF 1 MG/ML IJ SOLN
0.5000 mg | INTRAMUSCULAR | Status: DC | PRN
  Administered 2011-08-13 – 2011-08-14 (×2): 0.5 mg via INTRAVENOUS
  Filled 2011-08-13 (×3): qty 1

## 2011-08-13 NOTE — Progress Notes (Signed)
Occupational Therapy Treatment Patient Details Name: Stephanie Jarvis MRN: [id number] DOB: February 22, 1951 Today's Date: 08/13/2011  OT Assessment/Plan OT Assessment/Plan OT Frequency: Min 3X/week Follow Up Recommendations: Home health OT Equipment Recommended: 3 in 1 bedside comode OT Goals Acute Rehab OT Goals OT Goal Formulation: With patient Time For Goal Achievement: 3 days ADL Goals Pt Will Perform Grooming: with set-up;Sitting, edge of bed ADL Goal: Grooming - Progress: Goal set today Pt Will Perform Lower Body Dressing: with modified independence;Sitting, bed;Sitting, chair;with adaptive equipment ADL Goal: Lower Body Dressing - Progress: Goal set today Pt Will Transfer to Toilet: with modified independence;with DME;Raised toilet seat with arms;Maintaining weight bearing status;with supervision ADL Goal: Toilet Transfer - Progress: Goal set today Pt Will Perform Tub/Shower Transfer: Shower transfer;with modified independence;with supervision;Grab bars;Shower seat without back;with DME ADL Goal: Web designer - Progress: Goal set today  OT Treatment Precautions/Restrictions  Precautions Precautions: Knee Precaution Booklet Issued: No Required Braces or Orthoses: No Restrictions Weight Bearing Restrictions: No LLE Weight Bearing: Weight bearing as tolerated Sensation Light Touch: Appears Intact Stereognosis: Not tested Hot/Cold: Appears Intact Proprioception: Appears Intact Coordination Gross Motor Movements are Fluid and Coordinated: No Fine Motor Movements are Fluid and Coordinated: Yes Coordination and Movement Description: Pain inpeads xcoordination with bed mobility ADL ADL Eating/Feeding: Not assessed Grooming: Not assessed Upper Body Bathing: Not assessed Lower Body Bathing: Not assessed Upper Body Dressing: Not assessed Lower Body Dressing: Performed;Set up;Minimal assistance Lower Body Dressing Details (indicate cue type and reason): trained in use of  Reacher to doff socks and sock aid to donn socks and where to obtain adaptive equipment. Patient peerformed after demo with Min A Where Assessed - Lower Body Dressing: Sitting, chair Toilet Transfer: Not assessed Toileting - Clothing Manipulation: Not assessed Toileting - Hygiene: Not assessed Tub/Shower Transfer: Not assessed Mobility  Bed Mobility Bed Mobility: Yes Rolling Right: 6: Modified independent (Device/Increase time) Right Sidelying to Sit: 4: Min assist Right Sidelying to Sit Details (indicate cue type and reason): needed assist with LLE Supine to Sit: 6: Modified independent (Device/Increase time);5: Supervision Supine to Sit Details (indicate cue type and reason): bed rails Sitting - Scoot to Edge of Bed: 5: Supervision Sitting - Scoot to [place] of Bed Details (indicate cue type and reason): Assisted left foot to EOB Sit to Supine: 5: Supervision;4: Min assist Sit to Supine - Details (indicate cue type and reason): Assisted left foot Scooting to HOB: 6: Modified independent (Device/Increase time);5: Supervision Scooting to [facility] Details (indicate cue type and reason): verbal cues Transfers Transfers: No Sit to Stand: 4: Min assist Stand to Sit: 5: Supervision Exercises Total Joint Exercises Ankle Circles/Pumps: AROM;Both;10 reps;Supine Quad Sets: AROM;Both;10 reps;Supine Short Arc Quad: AAROM;Left;10 reps;Supine Heel Slides: AAROM;Left;10 reps;Supine  End of Session OT - End of Session Activity Tolerance: Patient tolerated treatment well Patient left: in chair;with call bell in reach;with family/visitor present General Behavior During Session: [facility] for tasks performed Cognition: [facility] for tasks performed  Lisa Roca OTR/L 08/13/2011, 11:29 AM

## 2011-08-13 NOTE — Evaluation (Signed)
Physical Therapy Evaluation Patient Details Name: Stephanie Jarvis MRN: [id number] DOB: [birth year] Today's Date: 08/13/2011  Problem List:  Patient Active Problem List  Diagnoses  . OA (osteoarthritis) of knee    Past Medical History:  Past Medical History  Diagnosis Date  . Arthritis   . Knee pain   . Ankle pain   . Irregular heartbeat   . High cholesterol   . Complication of anesthesia     Aspirates when having per patient  . Dysrhythmia 2011    Atrial Fibrillation  . Hepatitis     Hepatitis A when 61 years old  . GERD (gastroesophageal reflux disease)   . H/O hiatal hernia 2003   Past Surgical History:  Past Surgical History  Procedure Date  . Tubal ligation 1980  . Breast lumpectomy 1191,4782    x 2   . Hiatal hernia repair 2003  . Lymph node removal 2008  . Total knee arthroplasty 2006    PT Assessment/Plan/Recommendation PT Assessment Clinical Impression Statement: very cooperative and well motivated pt with good discharge plan to home.Marland KitchenMarland KitchenAA ROM L knee = -8 to 56 deg...min assist for transfers, gait with walker to chair...will follow per TKR protocol PT Recommendation/Assessment: Patient will need skilled PT in the acute care venue PT Problem List: Decreased strength;Decreased range of motion;Decreased activity tolerance;Decreased mobility;Decreased knowledge of use of DME;Decreased safety awareness;Decreased knowledge of precautions;Pain Barriers to Discharge: None PT Therapy Diagnosis : Difficulty walking;Abnormality of gait;Acute pain PT Plan PT Frequency: 7X/week PT Treatment/Interventions: DME instruction;Gait training;Stair training;Therapeutic activities;Functional mobility training;Therapeutic exercise;Patient/family education PT Recommendation Follow Up Recommendations: Home health PT Equipment Recommended: 3 in 1 bedside comode PT Goals  Acute Rehab PT Goals PT Goal Formulation: With patient Time For Goal Achievement: 2 weeks Pt will go  Supine/Side to Sit: with modified independence PT Goal: Supine/Side to Sit - Progress: Goal set today Pt will go Sit to Supine/Side: with modified independence PT Goal: Sit to Supine/Side - Progress: Goal set today Pt will go Sit to Stand: with modified independence PT Goal: Sit to Stand - Progress: Goal set today Pt will go Stand to Sit: with modified independence PT Goal: Stand to Sit - Progress: Goal set today Pt will Ambulate: 16 - 50 feet;with modified independence PT Goal: Ambulate - Progress: Goal set today Pt will Go Up / Down Stairs: 1-2 stairs;with supervision;with rail(s) PT Goal: Up/Down Stairs - Progress: Goal set today  PT Evaluation Precautions/Restrictions  Precautions Precautions: Knee Precaution Booklet Issued: No Required Braces or Orthoses: No Restrictions Weight Bearing Restrictions: No LLE Weight Bearing: Weight bearing as tolerated Prior Functioning  Home Living Lives With: Family Receives Help From: Family Type of Home: House Home Layout: One level Home Access: Stairs to enter Entrance Stairs-Rails: Dealer of Steps: 2 Bathroom Shower/Tub: Health visitor: Handicapped height Bathroom Accessibility: Yes How Accessible: Accessible via walker Home Adaptive Equipment: Crutches;Straight cane;Walker - rolling Prior Function Level of Independence: Independent with basic ADLs;Independent with homemaking with ambulation;Independent with gait;Independent with transfers Driving: Yes Vocation: Retired Comments: has a walker and cane at home and raised toilet seat and shower seat in bathroom. States she needs a 3 in one commode. Cognition Cognition Arousal/Alertness: Awake/alert Overall Cognitive Status: Appears within functional limits for tasks assessed Orientation Level: Oriented X4 Sensation/Coordination Sensation Light Touch: Appears Intact Stereognosis: Not tested Hot/Cold: Appears Intact Proprioception:  Appears Intact Coordination Gross Motor Movements are Fluid and Coordinated: No Fine Motor Movements are Fluid and Coordinated: Yes Coordination and Movement  Description: Pain inpeads xcoordination with bed mobility Extremity Assessment RUE Assessment RUE Assessment: Within Functional Limits LUE Assessment LUE Assessment: Within Functional Limits RLE Assessment RLE Assessment: Within Functional Limits LLE Assessment LLE Assessment: Exceptions to WFL LLE PROM (degrees) Left Knee Extension 0-130: -8 Left Knee Flexion 0-140: 56 Mobility (including Balance) Bed Mobility Bed Mobility: Yes Rolling Right: 6: Modified independent (Device/Increase time) Right Sidelying to Sit: 4: Min assist Right Sidelying to Sit Details (indicate cue type and reason): needed assist with LLE Supine to Sit: 6: Modified independent (Device/Increase time);5: Supervision Supine to Sit Details (indicate cue type and reason): bed rails Sitting - Scoot to Edge of Bed: 5: Supervision Sitting - Scoot to [employer] of Bed Details (indicate cue type and reason): Assisted left foot to EOB Sit to Supine: 5: Supervision;4: Min assist Sit to Supine - Details (indicate cue type and reason): Assisted left foot Scooting to HOB: 6: Modified independent (Device/Increase time);5: Supervision Scooting to [facility] Details (indicate cue type and reason): verbal cues Transfers Transfers: Yes Sit to Stand: 4: Min assist Stand to Sit: 5: Supervision Ambulation/Gait Ambulation/Gait: Yes Ambulation/Gait Assistance: 4: Min assist Ambulation Distance (Feet): 2 Feet Assistive device: Rolling walker Gait Pattern: Step-to pattern;Decreased step length - left;Decreased stance time - left;Antalgic;Left flexed knee in stance Stairs: No Wheelchair Mobility Wheelchair Mobility: No  Posture/Postural Control Posture/Postural Control: No significant limitations Balance Balance Assessed: No Exercise  Total Joint Exercises Ankle Circles/Pumps:  AROM;Both;10 reps;Supine Quad Sets: AROM;Both;10 reps;Supine Short Arc Quad: AAROM;Left;10 reps;Supine Heel Slides: AAROM;Left;10 reps;Supine End of Session PT - End of Session Equipment Utilized During Treatment: Gait belt Activity Tolerance: Patient tolerated treatment well Patient left: in chair;with call bell in reach;with family/visitor present Nurse Communication: Mobility status for transfers;Mobility status for ambulation General Behavior During Session: [facility] for tasks performed Cognition: [facility] for tasks performed  Konrad Penta 08/13/2011, 10:28 AM

## 2011-08-13 NOTE — Progress Notes (Signed)
Referred to this CSW today for ?SNF. Chart reviewed- per PT/OT patient plans to d/c home with [facility] and DME. CSW to sign off- please contact us if SW needs arise. Reece Levy, MSW, Theresia Majors [phone]

## 2011-08-13 NOTE — Progress Notes (Signed)
Subjective: 1 Day Post-Op Procedure(s) (LRB): TOTAL KNEE ARTHROPLASTY (Left) Patient reports pain as moderate.    Objective: Vital signs in last 24 hours: Temp:  [97.1 F (36.2 C)-98.8 F (37.1 C)] 98.8 F (37.1 C) (02/12 2132) Pulse Rate:  [75-95] 95  (02/12 2132) Resp:  [18] 18  (02/12 2132) BP: (131-169)/(79-92) 169/92 mmHg (02/12 2132) SpO2:  [96 %-97 %] 97 % (02/12 2132)  Intake/Output from previous day: 02/11 0701 - 02/12 0700 In: 2618.3 [P.O.:540; I.V.:1928.3; IV Piggyback:150] Out: 2575 [Urine:2550; Blood:25] Intake/Output this shift: Total I/O In: -  Out: 750 [Urine:750]   Basename 08/13/11 0545  HGB 10.5*    Basename 08/13/11 0545  WBC 8.5  RBC 3.33*  HCT 30.9*  PLT 217    Basename 08/13/11 0545  NA 139  K 4.0  CL 103  CO2 30  BUN 9  CREATININE 0.62  GLUCOSE 115*  CALCIUM 9.1   No results found for this basename: LABPT:2,INR:2 in the last 72 hours  Neurologically intact Neurovascular intact Sensation intact distally Intact pulses distally Dorsiflexion/Plantar flexion intact  Assessment/Plan: 1 Day Post-Op Procedure(s) (LRB): TOTAL KNEE ARTHROPLASTY (Left) Advance diet Up with therapy  Fuller Canada 08/13/2011, 9:57 PM

## 2011-08-13 NOTE — Addendum Note (Signed)
Addendum  created 08/13/11 1107 by Corena Pilgrim, CRNA   Modules edited:Notes Section

## 2011-08-13 NOTE — Progress Notes (Signed)
CARE MANAGEMENT NOTE 08/13/2011  Patient:  Stephanie Jarvis, Stephanie Jarvis   Account Number:  [id number]  Date Initiated:  08/13/2011  Documentation initiated by:  Anibal Henderson  Subjective/Objective Assessment:   Admitted  following  Lt total knee. Pt is from home with family, and plans to return home. MD office has set up [facility] with [employer] and pt agrees with using them. She states she has already had a visit from them.     Action/Plan:   States CPM has been set up also, with medical Modalities. She has a walker, and wasnt BSC from [facility]   Anticipated DC Date:  08/15/2011   Anticipated DC Plan:  HOME W HOME HEALTH SERVICES      DC Planning Services  CM consult      PAC Choice  DURABLE MEDICAL EQUIPMENT  HOME HEALTH   Choice offered to / List presented to:  C-1 Patient   DME arranged  CPAP  3-N-1      DME agency  [facility]IT[facility]  [facility]     HH arranged  HH-2 PT      HH agency  [facility]   Status of service:  In process, will continue to follow Medicare Important Message given?   (If response is "NO", the following Medicare IM given date fields will be blank) Date Medicare IM given:   Date Additional Medicare IM given:    Discharge Disposition:    Per UR Regulation:    Comments:  08/13/11 1100 Anibal Henderson RN

## 2011-08-13 NOTE — Anesthesia Postprocedure Evaluation (Signed)
  Anesthesia Post-op Note  Patient: Stephanie Jarvis  Procedure(s) Performed: Procedure(s) (LRB): TOTAL KNEE ARTHROPLASTY (Left)  Patient Location: Room 311  Anesthesia Type: Spinal  Level of Consciousness: awake, alert , oriented and patient cooperative  Airway and Oxygen Therapy: Patient Spontanous Breathing  Post-op Pain: mild  Post-op Assessment: Post-op Vital signs reviewed, Patient's Cardiovascular Status Stable, Respiratory Function Stable, Patent Airway and Pain level controlled  Post-op Vital Signs: Reviewed and stable  Complications: No apparent anesthesia complications

## 2011-08-13 NOTE — Progress Notes (Signed)
Physical Therapy Treatment Patient Details Name: Stephanie Jarvis MRN: [id number] DOB: 1951/05/20 Today's Date: 08/13/2011  PT Assessment/Plan  PT - Assessment/Plan Comments on Treatment Session: progressing well...not quite ready to begin gait with walker, but should be ready by tomorrow.Marland KitchenMarland KitchenROM L knee is 8-56 deg AA PT Plan: Discharge plan remains appropriate;Frequency remains appropriate Follow Up Recommendations: Home health PT PT Goals  Acute Rehab PT Goals PT Goal Formulation: With patient Time For Goal Achievement: 2 weeks Pt will go Supine/Side to Sit: with modified independence PT Goal: Supine/Side to Sit - Progress: Progressing toward goal Pt will go Sit to Supine/Side: with modified independence PT Goal: Sit to Supine/Side - Progress: Progressing toward goal Pt will go Sit to Stand: with modified independence PT Goal: Sit to Stand - Progress: Progressing toward goal Pt will go Stand to Sit: with modified independence PT Goal: Stand to Sit - Progress: Progressing toward goal Pt will Ambulate: 16 - 50 feet;with modified independence PT Goal: Ambulate - Progress: Goal set today Pt will Go Up / Down Stairs: 1-2 stairs;with supervision;with rail(s) PT Goal: Up/Down Stairs - Progress: Goal set today  PT Treatment Precautions/Restrictions  Precautions Precautions: Knee Precaution Booklet Issued: No Required Braces or Orthoses: No Restrictions Weight Bearing Restrictions: No LLE Weight Bearing: Weight bearing as tolerated Mobility (including Balance) Bed Mobility Sit to Supine: 4: Min assist Sit to Supine - Details (indicate cue type and reason): assist for LLE Transfers Sit to Stand: 5: Supervision Stand to Sit: 5: Supervision Ambulation/Gait Ambulation/Gait Assistance: 4: Min assist Ambulation Distance (Feet): 2 Feet Assistive device: Rolling walker Stairs: No Wheelchair Mobility Wheelchair Mobility: No    Exercise  Total Joint Exercises Ankle Circles/Pumps:  AROM;Both;10 reps;Supine Quad Sets: AROM;Both;10 reps;Supine Short Arc Quad: AAROM;Left;10 reps;Supine Heel Slides: AAROM;Left;10 reps;Supine End of Session PT - End of Session Equipment Utilized During Treatment: Gait belt Activity Tolerance: Patient tolerated treatment well;Patient limited by fatigue Patient left: in bed;with call bell in reach;with family/visitor present;in CPM General Behavior During Session: [facility] for tasks performed Cognition: [facility] for tasks performed  Konrad Penta 08/13/2011, 2:26 PM

## 2011-08-13 NOTE — Evaluation (Signed)
Occupational Therapy Evaluation Patient Details Name: Stephanie Jarvis MRN: [id number] DOB: January 29, 1951 Today's Date: 08/13/2011  Problem List:  Patient Active Problem List  Diagnoses  . OA (osteoarthritis) of knee    Past Medical History:  Past Medical History  Diagnosis Date  . Arthritis   . Knee pain   . Ankle pain   . Irregular heartbeat   . High cholesterol   . Complication of anesthesia     Aspirates when having per patient  . Dysrhythmia 2011    Atrial Fibrillation  . Hepatitis     Hepatitis A when 61 years old  . GERD (gastroesophageal reflux disease)   . H/O hiatal hernia 2003   Past Surgical History:  Past Surgical History  Procedure Date  . Tubal ligation 1980  . Breast lumpectomy 7829,5621    x 2   . Hiatal hernia repair 2003  . Lymph node removal 2008  . Total knee arthroplasty 2006    OT Assessment/Plan/Recommendation OT Assessment Clinical Impression Statement: A: Patient demonstrates ability to follow directions and has a little anxiety with movement and pain with movement only. She has normal upper body strength and AROM  but will benefit from OT training on use of AE/ DME for ADL's to calm anxiety and increase safety at home with transfers, dressing and bathing. OT Recommendation/Assessment: Patient will need skilled OT in the acute care venue OT Problem List: Decreased strength;Decreased knowledge of use of DME or AE;Decreased safety awareness;Decreased knowledge of precautions Barriers to Discharge: None OT Therapy Diagnosis : Acute pain;Generalized weakness OT Plan OT Frequency: Min 3X/week OT Treatment/Interventions: Self-care/ADL training;Therapeutic exercise;Therapeutic activities;Manual therapy;Modalities;Patient/family education;Balance training OT Recommendation Follow Up Recommendations: Home health OT Equipment Recommended: Defer to next venue Individuals Consulted Consulted and Agree with Results and Recommendations: Patient;Family  member/caregiver Family Member Consulted: Mother, and sister who live with patient. OT Goals Acute Rehab OT Goals OT Goal Formulation: With patient Time For Goal Achievement: 3 days ADL Goals Pt Will Perform Grooming: with set-up;Sitting, edge of bed ADL Goal: Grooming - Progress: Goal set today Pt Will Perform Lower Body Dressing: with modified independence;Sitting, bed;Sitting, chair;with adaptive equipment ADL Goal: Lower Body Dressing - Progress: Goal set today Pt Will Transfer to Toilet: with modified independence;with DME;Raised toilet seat with arms;Maintaining weight bearing status;with supervision ADL Goal: Toilet Transfer - Progress: Goal set today Pt Will Perform Tub/Shower Transfer: Shower transfer;with modified independence;with supervision;Grab bars;Shower seat without back;with DME ADL Goal: Web designer - Progress: Goal set today  OT Evaluation Precautions/Restrictions  Precautions Precautions: Knee Precaution Booklet Issued: No Required Braces or Orthoses: No Restrictions Weight Bearing Restrictions: Yes LLE Weight Bearing: Non weight bearing Prior Functioning Home Living Lives With: Family Receives Help From: Family Type of Home: House Home Layout: One level;Full bath on main level;Able to live on main level with bedroom/bathroom Home Access: Stairs to enter Entrance Stairs-Rails: Can reach both Entrance Stairs-Number of Steps: 4 at front entrance, 2 from garage and one into kitchen Bathroom Shower/Tub: Health visitor: Handicapped height Bathroom Accessibility: Yes How Accessible: Accessible via walker Home Adaptive Equipment: Bedside commode/3-in-1;Tub transfer bench Prior Function Level of Independence: Independent with basic ADLs;Independent with homemaking with ambulation Driving: Yes Vocation: Retired Comments: has a walker and cane at home and raised toilet seat and shower seat in bathroom. States she needs a 3 in one  commode. ADL ADL Eating/Feeding: Not assessed Grooming: Not assessed Upper Body Bathing: Not assessed Lower Body Bathing: Not assessed Upper Body Dressing: Not assessed  Lower Body Dressing: Not assessed Toilet Transfer: Not assessed Toileting - Clothing Manipulation: Not assessed Toileting - Hygiene: Not assessed Tub/Shower Transfer: Not assessed Vision/Perception  Vision - History Baseline Vision: Wears glasses all the time Patient Visual Report: No change from baseline Vision - Assessment Eye Alignment: Within Functional Limits Vision Assessment: Vision not tested Cognition Cognition Arousal/Alertness: Awake/alert Overall Cognitive Status: Appears within functional limits for tasks assessed Orientation Level: Oriented X4 Sensation/Coordination Sensation Light Touch: Appears Intact Stereognosis: Not tested Hot/Cold: Appears Intact Proprioception: Appears Intact Coordination Gross Motor Movements are Fluid and Coordinated: No Fine Motor Movements are Fluid and Coordinated: Yes Coordination and Movement Description: Pain inpeads xcoordination with bed mobility Extremity Assessment RUE Assessment RUE Assessment: Within Functional Limits LUE Assessment LUE Assessment: Within Functional Limits Mobility  Bed Mobility Bed Mobility: Yes Rolling Right: 6: Modified independent (Device/Increase time) Right Sidelying to Sit: 5: Supervision;6: Modified independent (Device/Increase time) Right Sidelying to Sit Details (indicate cue type and reason): use of bed rails Supine to Sit: 6: Modified independent (Device/Increase time);5: Supervision Supine to Sit Details (indicate cue type and reason): bed rails Sitting - Scoot to Edge of Bed: 4: Min assist;5: Supervision Sitting - Scoot to [employer] of Bed Details (indicate cue type and reason): Assisted left foot to EOB Sit to Supine: 5: Supervision;4: Min assist Sit to Supine - Details (indicate cue type and reason): Assisted left  foot Scooting to HOB: 6: Modified independent (Device/Increase time);5: Supervision Scooting to [facility] Details (indicate cue type and reason): verbal cues Transfers Transfers: No Exercises   End of Session OT - End of Session Activity Tolerance: Patient tolerated treatment well Patient left: in bed;with call bell in reach;with family/visitor present (Hand off to PT) General Behavior During Session: [facility] for tasks performed Cognition: [facility] for tasks performed   Lisa Roca OTR/L 08/13/2011, 9:56 AM

## 2011-08-13 NOTE — Addendum Note (Signed)
Addendum  created 08/13/11 1103 by Corena Pilgrim, CRNA   Modules edited:Notes Section

## 2011-08-13 NOTE — Op Note (Signed)
08/12/2011  1:17 PM  PATIENT:  Stephanie Jarvis  61 y.o. female  PRE-OPERATIVE DIAGNOSIS:  Left knee osteoarthritis  POST-OPERATIVE DIAGNOSIS:  Left knee osteoarthritis  PROCEDURE:  Procedure(s): LEFT TOTAL KNEE ARTHROPLASTY  DEPUY SIGMA PS FB  71F  3T  35 P   10 POLY   SURGEON:  Surgeon(s): Fuller Canada, MD  PHYSICIAN ASSISTANT:   ASSISTANTS: W. MCFATTER AND RON HARRIS   ANESTHESIA:   spinal  EBL:  Total I/O In: 1300 [I.V.:1300] Out: 250 [Urine:250]  BLOOD ADMINISTERED:none  DRAINS: PAIN PUMP CATHETER   LOCAL MEDICATIONS USED:  MARCAINE   WITH EPI   SPECIMEN:  No Specimen  DISPOSITION OF SPECIMEN:  N/A  COUNTS:  YES  TOURNIQUET:   Total Tourniquet Time Documented: Thigh (Left) - 96 minutes  DICTATION: .[name] Dictation  PLAN OF CARE: Admit to inpatient   PATIENT DISPOSITION:  PACU - hemodynamically stable.   Delay start of Pharmacological VTE agent (>24hrs) due to surgical blood loss or risk of bleeding: yes   Indications for procedure disabling knee pain, failure to control pain with nonoperative measures  Details of procedure:  In the preop area the patient's LEFT knee was marked and countersigned by the surgeon, the chart was updated, consent was signed  The patient was taken to the operating room for spinal anesthetic followed by administering 2 g of Ancef based on weight of > 80 kg  A Foley catheter was inserted sterilely, then the operative extremity , LEFT LOWER  was prepped and draped sterilely  The timeout was completed  The limb was then exsanguinated with a six-inch Esmarch with the knee in flexion and the tourniquet was elevated to 300 mm of mercury. A midline incision was made, the subcutaneous tissues were divided down to the extensor mechanism. A medial arthrotomy was performed, the patella was everted,  the fat pad was resected. The medial and  lateral menisci were resected. The medial soft tissue sleeve was elevated to the mid  coronal plane. The anterior cruciate ligament and PCL were resected. Osteophytes were removed. The distal femur anterior surface was skeletonized with sharp dissection.  A three-eighths inch drill bit was used to enter the femoral canal which was suctioned and irrigated until the fluid was clear;  an intramedullary rod was placed in the femur with a 5 LEFT setting, the block was pinned in place; then an 12 mm distal femoral resection was performed. The cut was checked for flatness.  The sizing guide was then placed on the femur, the femur measured a size 3; the block was pinned in external rotation 3 using the epicondyles as reference; a  4-in-1 cutting block was placed and the 4 cuts were made with retractors protecting the collateral ligaments. The posterior osteophytes were removed with a curved osteotome. Residual PCL tissue was resected; residual meniscal tissue was resected.  The external tibial alignment instrument was set for tibial resection. The guide was   placed referencing the medial side which was the worn side and the stylus was set at 2 mm resection. Anterior slope was built in to match the patients anatomy; a neutral varus valgus cut was set using the medial third of the tibial tubercle as reference along with the medial portion of the lateral tibial spine. Theguide was stabilized with pins.  A saw was used to resect the anterior tibia. The tibia was sized to a size 3 base plate.  We then placed spacer blocks using a 10, 12.5 AND 15 spacer  block;  the knee was balanced  in extension and was balanced in flexion with the 15 spacer block   The box cut was then done using the box cutting guide. We then turned our attention to the patella.  The patella measured 22 mm we set the guide to leave 14 mm of patella.  After resection of the patella,  It was remeasured, and measured 14 mm. The size was 35. We then drilled the 3 peg holes.  We then did a trial reduction. The trial reduction was  excellent with full extension, balanced in extension, balance in flexion. Passive flexion equalled 110, patella normal tracking.   We then punched the tibia per technique.   The bone was then irrigated and dried while cement was mixed on the back table. The implants were checked for accuracy and then cemented in place; excess cement was removed; the cement was allowed to cure. The wound was then irrigated with copious amounts of saline, the posterior capsule was injected with 30 cc of Marcaine with epinephrine followed by 30 cc in the soft tissue. The 15 insert was placed, AND CHECKED FOR SECURITY, Range of motion matched trial reduction  The capsule was closed with #1 Bralon in interrupted and running fashion and then the joint was injected with 30 cc of Marcaine with epinephrine.  The subcutaneous pain pump was placed.  The subcutaneous tissues were closed with  0 Monocryl and 2-0 Monocryl in running fashion  Staples were used to reapproximate the skin edges.  Sterile dressings were applied. A radiograph was obtained. Cryo/Cuff was placed and activated.  The patient was then taken to the recovery room in stable condition.  Routine postop plan for knee replacement.

## 2011-08-14 LAB — CBC
Hemoglobin: 10 g/dL — ABNORMAL LOW (ref 12.0–15.0)
MCH: 31.6 pg (ref 26.0–34.0)
MCV: 91.8 fL (ref 78.0–100.0)
RBC: 3.16 MIL/uL — ABNORMAL LOW (ref 3.87–5.11)

## 2011-08-14 MED ORDER — SODIUM CHLORIDE 0.9 % IJ SOLN
INTRAMUSCULAR | Status: AC
  Administered 2011-08-14: 11:00:00
  Filled 2011-08-14: qty 3

## 2011-08-14 MED ORDER — OXYCODONE-ACETAMINOPHEN 5-325 MG PO TABS
1.0000 | ORAL_TABLET | ORAL | Status: DC
  Administered 2011-08-14 – 2011-08-15 (×4): 1 via ORAL
  Filled 2011-08-14 (×5): qty 1

## 2011-08-14 MED ORDER — POLYETHYLENE GLYCOL 3350 17 G PO PACK
17.0000 g | PACK | Freq: Every day | ORAL | Status: DC
  Administered 2011-08-14 – 2011-08-15 (×2): 17 g via ORAL
  Filled 2011-08-14 (×2): qty 1

## 2011-08-14 NOTE — Progress Notes (Signed)
Physical Therapy Treatment Patient Details Name: Stephanie Jarvis MRN: [id number] DOB: [birth year] Today's Date: 08/14/2011 Time: 8:[birth year]-05-49 Charge: Therex 23 min Gait 20 min PT Assessment/Plan  PT - Assessment/Plan Comments on Treatment Session: Pt very cooperative with treatment.  Able to complete all therex with min assistance L LE.  Increased distance ambulating with RW with decreased strtide length with good gait mechanics and  posture..  PT Goals  Acute Rehab PT Goals PT Goal: Supine/Side to Sit - Progress: Progressing toward goal PT Goal: Sit to Supine/Side - Progress: Progressing toward goal PT Goal: Sit to Stand - Progress: Progressing toward goal PT Goal: Stand to Sit - Progress: Met PT Goal: Ambulate - Progress: Progressing toward goal PT Goal: Up/Down Stairs - Progress: Not met  PT Treatment Precautions/Restrictions  Precautions Precautions: Knee Precaution Booklet Issued: No Required Braces or Orthoses: No Restrictions Weight Bearing Restrictions: Yes LLE Weight Bearing: Weight bearing as tolerated Mobility (including Balance) Bed Mobility Left Sidelying to Sit: 6: Modified independent (Device/Increase time);Other (comment) (raised up HOB, cueing to use UE A to scoot) Sitting - Scoot to Edge of Bed: 5: Supervision Sitting - Scoot to Edge of Bed Details (indicate cue type and reason): Assisted left foot to EOB   vc-ing to use UE to assist with scooting Sit to Supine: 4: Min assist Sit to Supine - Details (indicate cue type and reason): assist for L LE  Transfers Transfers: Yes Sit to Stand: 5: Supervision Stand to Sit: 5: Supervision Ambulation/Gait Ambulation/Gait: Yes Ambulation/Gait Assistance: 4: Min assist Ambulation/Gait Assistance Details (indicate cue type and reason): vc-ing for heel to toe gait, equal stride length Ambulation Distance (Feet): 100 Feet Assistive device: Rolling walker Gait Pattern: Step-through pattern;Decreased stride length Stairs:  No    Exercise  Total Joint Exercises Ankle Circles/Pumps: AROM;Both;15 reps Quad Sets: AROM;Left;15 reps Short Arc QuadBarbaraann Boys;Left;10 reps Heel Slides: AAROM;Left;10 reps End of Session PT - End of Session Equipment Utilized During Treatment: Gait belt Activity Tolerance: Patient tolerated treatment well Patient left: in chair;with call bell in reach;with family/visitor present General Behavior During Session: [facility] for tasks performed Cognition: [facility] for tasks performed  Juel Burrow 08/14/2011, 9:25 AM

## 2011-08-14 NOTE — Plan of Care (Signed)
Problem: Phase III Progression Outcomes Goal: Pain controlled on oral analgesia Outcome: Progressing The scheduled po pain medication seems very effective, at this point, pt has only had one IV dose of pain medication, and has rested very well.

## 2011-08-14 NOTE — Progress Notes (Signed)
Physical Therapy Treatment Patient Details Name: Stephanie Jarvis MRN: [id number] DOB: June 30, 1951 Today's Date: 08/14/2011  PT Assessment/Plan  PT - Assessment/Plan Comments on Treatment Session: Progressing extremely well.Marland KitchenROM L knee is -5 to 58 deg AA, pain well controlled, almost independent with transfers and gait...will instruct in steps in AM PT Plan: Discharge plan remains appropriate;Frequency remains appropriate PT Goals  Acute Rehab PT Goals PT Goal: Supine/Side to Sit - Progress: Met PT Goal: Sit to Supine/Side - Progress: Progressing toward goal PT Goal: Sit to Stand - Progress: Met PT Goal: Stand to Sit - Progress: Met PT Goal: Ambulate - Progress: Progressing toward goal PT Goal: Up/Down Stairs - Progress: Not met  PT Treatment Precautions/Restrictions  Precautions Precautions: Knee Precaution Booklet Issued: No Required Braces or Orthoses: No Restrictions Weight Bearing Restrictions: Yes LLE Weight Bearing: Weight bearing as tolerated Mobility (including Balance) Bed Mobility Left Sidelying to Sit: 6: Modified independent (Device/Increase time) Sitting - Scoot to Edge of Bed: 5: Supervision Sit to Supine: 4: Min assist Sit to Supine - Details (indicate cue type and reason): instruction in walking hips into bed as far as possible in order to complete transfer Scooting to [facility]: 6: Modified independent (Device/Increase time) Transfers Sit to Stand: 6: Modified independent (Device/Increase time) Stand to Sit: 6: Modified independent (Device/Increase time) Ambulation/Gait Ambulation/Gait Assistance: 5: Supervision Ambulation Distance (Feet): 20 Feet Assistive device: Rolling walker Gait Pattern: Step-through pattern Stairs: No Wheelchair Mobility Wheelchair Mobility: No    Exercise  Total Joint Exercises Ankle Circles/Pumps: AROM;Both;10 reps;Supine Quad Sets: AROM;Both;10 reps;Supine Short Arc Quad: AAROM;Left;10 reps;Supine Heel Slides: AAROM;Left;10  reps;Supine End of Session PT - End of Session Equipment Utilized During Treatment: Gait belt Activity Tolerance: Patient tolerated treatment well;Patient limited by fatigue Patient left: in bed;with call bell in reach;in CPM Nurse Communication:  (CPM off at 8 PM) General Behavior During Session: [facility] for tasks performed Cognition: [facility] for tasks performed  Konrad Penta 08/14/2011, 1:20 PM

## 2011-08-15 LAB — CBC
HCT: 27.6 % — ABNORMAL LOW (ref 36.0–46.0)
Hemoglobin: 9.4 g/dL — ABNORMAL LOW (ref 12.0–15.0)
MCV: 92 fL (ref 78.0–100.0)
RBC: 3 MIL/uL — ABNORMAL LOW (ref 3.87–5.11)
WBC: 6.7 10*3/uL (ref 4.0–10.5)

## 2011-08-15 MED ORDER — ASPIRIN 325 MG PO TBEC: 325.0000 mg | DELAYED_RELEASE_TABLET | Freq: Two times a day (BID) | ORAL | Status: AC

## 2011-08-15 MED ORDER — BISACODYL 5 MG PO TBEC: 5.0000 mg | DELAYED_RELEASE_TABLET | Freq: Every day | ORAL | Status: AC | PRN

## 2011-08-15 MED ORDER — METHOCARBAMOL 500 MG PO TABS: 500.0000 mg | ORAL_TABLET | Freq: Four times a day (QID) | ORAL | Status: AC | PRN

## 2011-08-15 MED ORDER — OXYCODONE-ACETAMINOPHEN 5-325 MG PO TABS: 1.0000 | ORAL_TABLET | ORAL | Status: AC

## 2011-08-15 NOTE — Discharge Summary (Signed)
   Discharge summary  Admitted on February 11  Discharge February 14th  Diagnosis/arthritis left knee  Discharge diagnosis same  Procedure left total knee arthroplasty  Procedure findings severe osteoarthritis left knee with 30 flexion contracture.  The patient was admitted tolerated the procedure well under spinal anesthesia. Had no postoperative problems or complications. She ambulated well tolerated CPM and physical therapy and was discharged home with home health.  At discharge she was afebrile her vital signs were stable her incision was clean there were no signs of infection  She will followup with [place] 2 weeks from Monday for routine postop care  She is full weightbearing.  CBC    Component Value Date/Time   WBC 6.7 08/15/2011 0604   RBC 3.00* 08/15/2011 0604   HGB 9.4* 08/15/2011 0604   HCT 27.6* 08/15/2011 0604   PLT 192 08/15/2011 0604   MCV 92.0 08/15/2011 0604   MCH 31.3 08/15/2011 0604   MCHC 34.1 08/15/2011 0604   RDW 13.7 08/15/2011 0604   LYMPHSABS 2.1 08/06/2011 1326   MONOABS 0.6 08/06/2011 1326   EOSABS 0.2 08/06/2011 1326   BASOSABS 0.1 08/06/2011 1326    BMET    Component Value Date/Time   NA 139 08/13/2011 0545   K 4.0 08/13/2011 0545   CL 103 08/13/2011 0545   CO2 30 08/13/2011 0545   GLUCOSE 115* 08/13/2011 0545   BUN 9 08/13/2011 0545   CREATININE 0.62 08/13/2011 0545   CALCIUM 9.1 08/13/2011 0545   GFRNONAA >90 08/13/2011 0545   GFRAA >90 08/13/2011 0545

## 2011-08-15 NOTE — Progress Notes (Signed)
Physical Therapy Treatment Patient Details Name: Stephanie Jarvis MRN: [id number] DOB: 16-Jul-1950 Today's Date: 08/15/2011  PT Assessment/Plan  PT - Assessment/Plan Comments on Treatment Session: Knee ROM is -5 to 90 deg...all acute care PT goals met PT Plan: All goals met and education completed, patient dischaged from PT services Equipment Recommended: Rolling walker with 5" wheels;3 in 1 bedside comode PT Goals  Acute Rehab PT Goals PT Goal: Supine/Side to Sit - Progress: Met PT Goal: Sit to Supine/Side - Progress: Met PT Goal: Sit to Stand - Progress: Met PT Goal: Stand to Sit - Progress: Met PT Goal: Ambulate - Progress: Met PT Goal: Up/Down Stairs - Progress: Met  PT Treatment Precautions/Restrictions  Precautions Precautions: Knee Precaution Booklet Issued: Yes (comment) Precaution Comments: booklet and exercise program issued to pt Required Braces or Orthoses: No Restrictions Weight Bearing Restrictions: Yes LLE Weight Bearing: Weight bearing as tolerated Mobility (including Balance) Bed Mobility Rolling Right: 7: Independent Right Sidelying to Sit: 6: Modified independent (Device/Increase time) Left Sidelying to Sit: 6: Modified independent (Device/Increase time) Supine to Sit: 6: Modified independent (Device/Increase time) Sitting - Scoot to Edge of Bed: 6: Modified independent (Device/Increase time) Sit to Supine: 6: Modified independent (Device/Increase time) Scooting to [facility]: 6: Modified independent (Device/Increase time) Transfers Sit to Stand: 6: Modified independent (Device/Increase time) Stand to Sit: 6: Modified independent (Device/Increase time) Ambulation/Gait Ambulation/Gait Assistance: 6: Modified independent (Device/Increase time) Ambulation Distance (Feet): 60 Feet Assistive device: Rolling walker Stairs: Yes Stairs Assistance: 5: Supervision Stairs Assistance Details (indicate cue type and reason): alternate methods of steps instructed Stair  Management Technique: One rail Left;Sideways;Backwards;With walker Number of Stairs: 2  Wheelchair Mobility Wheelchair Mobility: No    Exercise  Total Joint Exercises Ankle Circles/Pumps: AROM;Both;10 reps;Supine Quad Sets: AROM;Both;10 reps;Supine Short Arc Quad: AAROM;Left;10 reps;Supine Heel Slides: AAROM;Left;10 reps;Supine End of Session PT - End of Session Equipment Utilized During Treatment: Gait belt Activity Tolerance: Patient tolerated treatment well Patient left: in bed;with call bell in reach;with family/visitor present General Behavior During Session: [facility] for tasks performed Cognition: [facility] for tasks performed  Konrad Penta 08/15/2011, 9:43 AM

## 2011-08-15 NOTE — Progress Notes (Signed)
Pt discharge instructions reviewed with pt. Pt verbalized understanding of all discharge instructions and follow up appts. Pt accompanied to awaiting vehicle via w/c. Pt stable on discharge.

## 2011-08-15 NOTE — Progress Notes (Deleted)
CARE MANAGEMENT NOTE 08/15/2011  Patient:  Stephanie Jarvis   Account Number:  [id number]  Date Initiated:  08/15/2011  Documentation initiated by:  Anibal Henderson  Subjective/Objective Assessment:   Admitted with Bronchitis, asthma exacerbation. Pt is from home, lives with her daughter, and she plans to return there.     Action/Plan:   Agrees HH would be a benefit to check on her for 1-2 weeks. has used [facility] in the past and would like them again   Anticipated DC Date:  08/16/2011   Anticipated DC Plan:  HOME W HOME HEALTH SERVICES      DC Planning Services  CM consult      Choice offered to / List presented to:  C-1 Patient        HH arranged  HH-1 RN  HH-10 DISEASE MANAGEMENT  HH-2 PT      Status of service:  In process, will continue to follow Medicare Important Message given?   (If response is "NO", the following Medicare IM given date fields will be blank) Date Medicare IM given:   Date Additional Medicare IM given:    Discharge Disposition:    Per UR Regulation:    Comments:  08/15/11 1000 Anibal Henderson RN

## 2011-08-16 ENCOUNTER — Encounter (HOSPITAL_COMMUNITY): Payer: Self-pay | Admitting: Orthopedic Surgery

## 2011-08-19 ENCOUNTER — Ambulatory Visit: Admitting: Orthopedic Surgery

## 2011-08-21 LAB — TYPE AND SCREEN: Unit division: 0

## 2011-08-22 ENCOUNTER — Telehealth: Payer: Self-pay | Admitting: *Deleted

## 2011-08-26 ENCOUNTER — Telehealth: Payer: Self-pay | Admitting: Orthopedic Surgery

## 2011-08-26 ENCOUNTER — Encounter: Payer: Self-pay | Admitting: Orthopedic Surgery

## 2011-08-26 ENCOUNTER — Ambulatory Visit (INDEPENDENT_AMBULATORY_CARE_PROVIDER_SITE_OTHER): Admitting: Orthopedic Surgery

## 2011-08-26 VITALS — Ht 66.0 in | Wt 235.0 lb

## 2011-08-26 DIAGNOSIS — Z96659 Presence of unspecified artificial knee joint: Secondary | ICD-10-CM

## 2011-08-26 MED ORDER — OXYCODONE-ACETAMINOPHEN 5-325 MG PO TABS: 1.0000 | ORAL_TABLET | ORAL | Status: AC | PRN

## 2011-08-26 NOTE — Telephone Encounter (Signed)
Stephanie Jarvis asked how long does she have to wear the [facility]?

## 2011-08-26 NOTE — Progress Notes (Signed)
Patient ID: Stephanie Jarvis, female   DOB: [birth year], 61 y.o.   MRN: [id number] Chief Complaint  Patient presents with  . Follow-up    Post op #1, left TKA.   Date of surgery February 11  This is a 2 week followup status post LEFT total knee arthroplasty  Implants De[place]w posterior stabilized fixed bearing implant  Patient is doing well progressing well in therapy pain is well-controlled with Percocet and I will be refilled  Her knee looks good her incision looks good her staples were removed  She will come back in 4 weeks continue therapy and start outpatient and appropriate

## 2011-08-29 ENCOUNTER — Telehealth: Payer: Self-pay | Admitting: Orthopedic Surgery

## 2011-08-29 NOTE — Telephone Encounter (Signed)
6 weeks

## 2011-08-29 NOTE — Telephone Encounter (Signed)
Patient advised.

## 2011-08-29 NOTE — Telephone Encounter (Signed)
Received call from [facility], occupational therapist for [facility] care; states she went to patient's home today to evaluate her, and patient relayed that she is doing fine with present therapy, and did not wish to have occupational therapy.  An order will follow in fax.  Callie asked for Dr's reply - direct ph# [phone].

## 2011-09-11 ENCOUNTER — Telehealth: Payer: Self-pay | Admitting: Orthopedic Surgery

## 2011-09-11 ENCOUNTER — Other Ambulatory Visit: Payer: Self-pay | Admitting: Orthopedic Surgery

## 2011-09-11 DIAGNOSIS — Z96659 Presence of unspecified artificial knee joint: Secondary | ICD-10-CM

## 2011-09-11 NOTE — Telephone Encounter (Signed)
Asher Muir, sending to you to call in to Pharmacy

## 2011-09-11 NOTE — Telephone Encounter (Signed)
Out patient pt order   norco 7.5 mg 60 2 refill pain med order

## 2011-09-11 NOTE — Telephone Encounter (Signed)
Stephanie Jarvis called with 2 questions: 1.  She will finish in home therapy this week and needs an order to start outpatient  Rehab at [facility] next week starting 09/16/11.  2.  She is almost out of the Percocet, needs another prescription for pain, said if you change to something else, she uses [facility] . Her # [phone]

## 2011-09-12 MED ORDER — HYDROCODONE-ACETAMINOPHEN 7.5-325 MG PO TABS: 1.0000 | ORAL_TABLET | ORAL | Status: DC | PRN

## 2011-09-12 NOTE — Telephone Encounter (Signed)
MEDICINE CALLED IN AND PT ORDERED

## 2011-09-13 NOTE — Telephone Encounter (Signed)
Medication has been sent.  

## 2011-09-18 ENCOUNTER — Ambulatory Visit (HOSPITAL_COMMUNITY)
Admission: RE | Admit: 2011-09-18 | Discharge: 2011-09-18 | Disposition: A | Discharge status: Home / Self Care | Source: Ambulatory Visit | Attending: Orthopedic Surgery | Admitting: Orthopedic Surgery

## 2011-09-18 DIAGNOSIS — R262 Difficulty in walking, not elsewhere classified: Secondary | ICD-10-CM | POA: Insufficient documentation

## 2011-09-18 DIAGNOSIS — IMO0001 Reserved for inherently not codable concepts without codable children: Secondary | ICD-10-CM | POA: Insufficient documentation

## 2011-09-18 DIAGNOSIS — M25469 Effusion, unspecified knee: Secondary | ICD-10-CM | POA: Insufficient documentation

## 2011-09-18 DIAGNOSIS — M25462 Effusion, left knee: Secondary | ICD-10-CM | POA: Insufficient documentation

## 2011-09-18 DIAGNOSIS — M25569 Pain in unspecified knee: Secondary | ICD-10-CM | POA: Insufficient documentation

## 2011-09-18 DIAGNOSIS — M25669 Stiffness of unspecified knee, not elsewhere classified: Secondary | ICD-10-CM | POA: Insufficient documentation

## 2011-09-18 NOTE — Patient Instructions (Addendum)
HEP given 

## 2011-09-18 NOTE — Evaluation (Signed)
Physical Therapy Evaluation  Patient Details  Name: Stephanie Jarvis MRN: [id number] Date of Birth: [birth year]  Today's Date: 09/18/2011 Time: 4098-1191 Time Calculation (min): 45 min  Visit#: 1  of 18   Re-eval: 09/23/11 (MD appointment 3/26) Assessment Diagnosis: L TKR Surgical Date: 08/12/11 Next MD Visit: 08/27/11 Prior Therapy: [facility]  Past Medical History:  Past Medical History  Diagnosis Date  . Arthritis   . Knee pain   . Ankle pain   . Irregular heartbeat   . High cholesterol   . Complication of anesthesia     Aspirates when having per patient  . Dysrhythmia 2011    Atrial Fibrillation  . Hepatitis     Hepatitis A when 61 years old  . GERD (gastroesophageal reflux disease)   . H/O hiatal hernia 2003   Past Surgical History:  Past Surgical History  Procedure Date  . Tubal ligation 1980  . Breast lumpectomy 4782,9562    x 2   . Hiatal hernia repair 2003  . Lymph node removal 2008  . Total knee arthroplasty 2006  . Total knee arthroplasty 08/12/2011    Procedure: TOTAL KNEE ARTHROPLASTY;  Surgeon: Fuller Canada, MD;  Location: [facility];  Service: Orthopedics;  Laterality: Left;    Subjective Symptoms/Limitations Symptoms: Stephanie Jarvis states that she had a total knee replacement on 08/12/11.  The paitients hospital couse was normal and she returned home and had home health therapy.  She has completed her home health theapy and is now being referred to outptient therapy to return the patient to her normal ADL.  The  patient  is currently walking with a cane.   How long can you sit comfortably?: The patient states that she is able to sit for over an hour without difficulty. How long can you stand comfortably?: The patient states that she is only able to stand for fifteen to twenty minutes but this is due to her back rather than her knee. How long can you walk comfortably?: The patient states she has only walked for twenty minutes at a time without discomfort. Special  Tests: The patient states that she is having some problems sleeping.  She feels that she is not doing enough to make her tired.  She takes meds at night about 75% of the time to get to sleep. Pain Assessment Currently in Pain?: Yes Pain Score:   1 (highest in the past week was has been an 8/10.) Pain Location: Knee Pain Orientation: Left;Lower Pain Type: Acute pain;Surgical pain Pain Onset: 1 to 4 weeks ago Pain Frequency: Intermittent Pain Relieving Factors: meds, ice and motion.    Prior Functioning  Home Living Lives With: Family Prior Function Able to Take Stairs?: Reciprically Vocation: Retired Leisure: Hobbies-yes (Comment) Comments: walking a mile; would like to volunteer.  Cognition/Observation Cognition Overall Cognitive Status: Appears within functional limits for tasks assessed  Sensation/Coordination/Flexibility/Functional Tests Functional Tests Functional Tests: LEFS 35/80  Assessment LLE AROM (degrees) Left Knee Extension 0-130: 5  Left Knee Flexion 0-140: 102  LLE PROM (degrees) Left Knee Extension 0-130: 3 Left Knee Flexion 0-140: 105 LLE Strength Left Hip Flexion: 5/5 Left Hip Extension: 2+/5 Left Hip ABduction: 3/5 Left Knee Flexion: 3/5 Left Knee Extension: 3+/5 Left Ankle Dorsiflexion: 3+/5  Exercise/Treatments   Supine Quad Sets: Strengthening;Left;10 reps (pull heel back, point toe and push back of knee down.) Heel Slides: Left;5 reps (do first effort hold 3 breaths then take a deep breath and pull back further.) Other Supine Knee Exercises:  PROM for ext Sidelying Hip ABduction: Strengthening;Left;5 reps;Limitations Hip ABduction Limitations: hold 10 sec. Hip ADduction: Strengthening;Left;5 reps;Limitations Hip ADduction Limitations: hold ten seconds. Prone  Hamstring Curl: 5 reps Hip Extension: Left;5 reps (hold ten seconds.)   Manual Therapy Manual Therapy: Massage Massage: retromassage to decrease swelling  Physical Therapy  Assessment and Plan PT Assessment and Plan Clinical Impression Statement: Pt with pain, swelling, decreased strength and difficulty walking due to a total knee replacement.  PT will benefit from skilled PT to return pt to previous level of care. PT Frequency: Min 3X/week PT Duration: 6 weeks PT Treatment/Interventions: Gait training;Stair training;Therapeutic activities;Therapeutic exercise;Patient/family education;Other (comment) (modalities to decrease swelling and pain) PT Plan: see three times a week; goal is to decrease swelling which will improve ROM then work on strength.  Next treatment add bike; Terminal standing and supine ex, PROM for flex while prone.    Goals Home Exercise Program Pt will Perform Home Exercise Program: Independently PT Short Term Goals Time to Complete Short Term Goals: 2 weeks PT Short Term Goal 1: Pt pain to be no greater than a 5 PT Short Term Goal 2: Pt ROM to be 0 to 110 to allow pt to have proper heel toe gt and squaat to pick up items off floor PT Long Term Goals Time to Complete Long Term Goals:  (6 weeks) PT Long Term Goal 1: Pain to be no greater than a 2 to allow pt to sleep without pain meds PT Long Term Goal 2: ROM to be to 120 degrees to allow pt to ride in a car for two hours straight to visit children Long Term Goal 3: Pt to be able to go down garage step reciprocally. Long Term Goal 4: strength to be at least 4/5 to allow pt to feel comfortable ambulating without tne cane outdoors. PT Long Term Goal 5: improve LEFS by 10 points  Problem List Patient Active Problem List  Diagnoses  . OA (osteoarthritis) of knee  . Pain in joint, lower leg  . Stiffness of joint, lower leg  . Difficulty in walking  . Swelling of joint of left knee    PT - End of Session Activity Tolerance: Patient tolerated treatment well General Behavior During Session: [facility] for tasks performed PT Plan of Care Consulted and Agree with Plan of Care:  Patient    Stephanie Jarvis,CINDY 09/18/2011, 2:55 PM  Physician Documentation Your signature is required to indicate approval of the treatment plan as stated above.  Please sign and either send electronically or make a copy of this report for your files and return this physician signed original.   Please mark one 1.__approve of plan  2. ___approve of plan with the following conditions.   ______________________________                                                          _____________________ Physician Signature  Date  

## 2011-09-20 ENCOUNTER — Ambulatory Visit (HOSPITAL_COMMUNITY)
Admission: RE | Admit: 2011-09-20 | Discharge: 2011-09-20 | Disposition: A | Discharge status: Home / Self Care | Source: Ambulatory Visit | Attending: Physical Therapy | Admitting: Physical Therapy

## 2011-09-20 ENCOUNTER — Telehealth (HOSPITAL_COMMUNITY): Payer: Self-pay | Admitting: Physical Therapy

## 2011-09-20 DIAGNOSIS — M25669 Stiffness of unspecified knee, not elsewhere classified: Secondary | ICD-10-CM

## 2011-09-20 DIAGNOSIS — R262 Difficulty in walking, not elsewhere classified: Secondary | ICD-10-CM

## 2011-09-20 DIAGNOSIS — M25462 Effusion, left knee: Secondary | ICD-10-CM

## 2011-09-20 DIAGNOSIS — M25569 Pain in unspecified knee: Secondary | ICD-10-CM

## 2011-09-20 NOTE — Patient Instructions (Signed)
Hep

## 2011-09-20 NOTE — Progress Notes (Signed)
Physical Therapy Treatment Patient Details  Name: ARACELY RICKETT MRN: [id number] Date of Birth: 1951/04/25  Today's Date: 09/20/2011 Time: 5284-1324 Time Calculation (min): 55 min Visit#: 2  of 18   Re-eval: 09/23/11  there ex 1, massage 2; IP  Subjective: Symptoms/Limitations Symptoms: Pt states she was trying to do the exercises that her home health therapist gave her twice a day and the new ones three times a day.  She had increased pain down the lateral aspect of her thigh Thursday night that continued this Morning though she is better than this morning.  Therapist counseled that she was over exercising too much and to decrease in her execise . Pain Assessment Currently in Pain?: No/denies Pain Score:   1 Pain Location: Knee Pain Orientation: Left Pain Type: Surgical pain Multiple Pain Sites: Yes; lateral aspect of thigh 5/10   Exercise/Treatments    Supine Quad Sets: Strengthening;Left;10 reps (pull heel back, point toe and push back of knee down.) Heel Slides: Left;10 reps (do first effort hold 3 breaths then take a deep breath and p) Hip Adduction Isometric: Left;10 reps;Limitations Hip Adduction Isometric Limitations: Ab and adduction Bridges: Strengthening;Left;10 reps Other Supine Knee Exercises: PROM for ext     Modalities Modalities: Cryotherapy Manual Therapy Manual Therapy: Massage Massage: retro massage to decrease swelling from ankle to hip Cryotherapy Cryotherapy Location: Knee Type of Cryotherapy: Ice pack Pt states she felt much better after therapy.  Physical Therapy Assessment and Plan PT Assessment and Plan Clinical Impression Statement: Pt with overuse syndrome.  Pt advised to exercise only twice a day over the weekend and ice 4-6 times a day. Rehab Potential: Good PT Frequency: Min 3X/week PT Duration: 6 weeks PT Treatment/Interventions: Gait training;Therapeutic exercise;Patient/family education PT Plan: begin bike, standing terminal ext,  rockerboard; supine terminal ext and PROM for both flex and ext.    Goals    Problem List Patient Active Problem List  Diagnoses  . OA (osteoarthritis) of knee  . Pain in joint, lower leg  . Stiffness of joint, lower leg  . Difficulty in walking  . Swelling of joint of left knee    PT - End of Session Activity Tolerance: Patient tolerated treatment well General Behavior During Session: [facility] for tasks performed Cognition: [facility] for tasks performed  GP No functional reporting required  Tavia Stave,CINDY 09/20/2011, 4:35 PM

## 2011-09-23 ENCOUNTER — Ambulatory Visit (HOSPITAL_COMMUNITY)
Admission: RE | Admit: 2011-09-23 | Discharge: 2011-09-23 | Disposition: A | Discharge status: Home / Self Care | Source: Ambulatory Visit | Attending: Internal Medicine | Admitting: Internal Medicine

## 2011-09-23 NOTE — Progress Notes (Signed)
Physical Therapy Treatment Patient Details  Name: Stephanie Jarvis MRN: [id number] Date of Birth: [birth year]  Today's Date: 09/23/2011 Time: 4098-1191 Time Calculation (min): 58 min Visit#: 3  of 18   Re-eval: 10/16/11 Charges:  therex 32', Manual 10', icepack 10'   Subjective: Symptoms/Limitations Symptoms: Pt. reports massage helped last visit; states she slept better the 2 nights following therapy.  Pt. reports pain 2/10 on arrival but increased to 5/10 while on bike working on ROM (unable to make full revolution)  Objective: AROM:  4 to 105 (was 5 to 102 degrees) PROM:  2 to 110 (was 3 to 105 degrees)  Exercise/Treatments Aerobic Stationary Bike: 5' rocking Standing Terminal Knee Extension: 10 reps;Theraband Theraband Level (Terminal Knee Extension): Level 4 (Blue) Rocker Board: 2 minutes (R/L) Supine Quad Sets: Strengthening;Left;10 reps Heel Slides: Left;10 reps Hip Adduction Isometric: Left;10 reps;Limitations Hip Adduction Isometric Limitations: Ab and adduction Bridges: Strengthening;Left;10 reps Knee Extension: PROM Knee Flexion: PROM  Modalities Modalities: Cryotherapy Manual Therapy Manual Therapy: Massage Massage: Retro massage with elevation to decrease edema. Cryotherapy Number Minutes Cryotherapy: 10 Minutes Cryotherapy Location: Knee Type of Cryotherapy: Ice pack  Physical Therapy Assessment and Plan PT Assessment and Plan Clinical Impression Statement: Added bike, however pt. unable to make a full revolution.  Also added rockerboard and TKE with blue theraband to increase strength/stability.  Pt. able to complete without discomfort.; using her [facility] for community ambulation/no AD indoors.   Plan:  Continue to progress towards goals.   Problem List Patient Active Problem List  Diagnoses  . OA (osteoarthritis) of knee  . Pain in joint, lower leg  . Stiffness of joint, lower leg  . Difficulty in walking  . Swelling of joint of left knee    PT  - End of Session Activity Tolerance: Patient tolerated treatment well General Behavior During Session: [facility] for tasks performed Cognition: [facility] for tasks performed PT Plan of Care PT Home Exercise Plan: Continue to progress per POC.    Elyssa Pendelton B. Bascom Levels, PTA 09/23/2011, 6:14 PM

## 2011-09-24 ENCOUNTER — Encounter: Payer: Self-pay | Admitting: Orthopedic Surgery

## 2011-09-24 ENCOUNTER — Ambulatory Visit (INDEPENDENT_AMBULATORY_CARE_PROVIDER_SITE_OTHER): Admitting: Orthopedic Surgery

## 2011-09-24 VITALS — BP 112/60 | Ht 66.0 in | Wt 235.0 lb

## 2011-09-24 DIAGNOSIS — M1710 Unilateral primary osteoarthritis, unspecified knee: Secondary | ICD-10-CM

## 2011-09-24 MED ORDER — HYDROCODONE-ACETAMINOPHEN 7.5-325 MG PO TABS: 1.0000 | ORAL_TABLET | ORAL | Status: AC | PRN

## 2011-09-24 NOTE — Patient Instructions (Signed)
Continue therapy

## 2011-09-24 NOTE — Progress Notes (Signed)
Patient ID: Stephanie Jarvis, female   DOB: 06/23/1951, 61 y.o.   MRN: [id number] Chief Complaint  Patient presents with  . Follow-up    6 post op visit week recheck left knee, TKA 08/12/11    Physical therapy notes are reviewed. Patien progressing well with 110 of passive range of motion and 105 of active range of motion.  The knee looks good.  Refill prescription continued therapy. Followup in 6 weeks

## 2011-09-25 ENCOUNTER — Ambulatory Visit (HOSPITAL_COMMUNITY)
Admission: RE | Admit: 2011-09-25 | Discharge: 2011-09-25 | Disposition: A | Discharge status: Home / Self Care | Source: Ambulatory Visit | Attending: Internal Medicine | Admitting: Internal Medicine

## 2011-09-25 DIAGNOSIS — M25669 Stiffness of unspecified knee, not elsewhere classified: Secondary | ICD-10-CM

## 2011-09-25 DIAGNOSIS — M25569 Pain in unspecified knee: Secondary | ICD-10-CM

## 2011-09-25 DIAGNOSIS — M25462 Effusion, left knee: Secondary | ICD-10-CM

## 2011-09-25 DIAGNOSIS — R262 Difficulty in walking, not elsewhere classified: Secondary | ICD-10-CM

## 2011-09-25 NOTE — Progress Notes (Addendum)
Physical Therapy Treatment Patient Details  Name: Stephanie Jarvis MRN: [id number] Date of Birth: 15-Jan-1951  Today's Date: 09/25/2011 Time: 4098-1191 (Tx started by Annett Fabian, PT [phone]) Time Calculation (min): 73 min Visit#: 4  of 18   Re-eval: 10/16/11 Assessment Next MD Visit: 11/05/11 Charges: Therex x 42' Manual x 8' Ice x 10'  Subjective: Symptoms/Limitations Symptoms: Pt reports she is stiff today.  She states that the massage helps for a few hours and helps her to sleep better at night.  Pain Assessment Currently in Pain?: Yes Pain Score:   2 Pain Location: Knee (Stiffnes)   Exercise/Treatments Aerobic Stationary Bike: 8' total.  4 minutes rocking (went backwards for 1 minute and then able to go forward for remaining time) Seat 11 Supine Short Arc Quad Sets: Left;5 reps (10 sec holds) Heel Slides: Left;10 reps (w/manual resistance for flexion and extension) Hip Adduction Isometric: Left;10 reps (10 sec holds for each) Hip Adduction Isometric Limitations: Ab and adduction Bridges: 10 reps Straight Leg Raises: 10 reps Patellar Mobs: Sup/Inf and medial/lateral 2 min each, grade I-II proximal tibfib joint mobs to decreae pian Knee Extension: PROM Knee Flexion: PROM Sidelying Hip ABduction: 10 reps Hip ABduction Limitations: hold 10 sec. Hip ADduction: 5 reps Hip ADduction Limitations: hold ten seconds. Prone  Hamstring Curl: 10 reps Hip Extension: 5 reps   Modalities Modalities: Cryotherapy Manual Therapy Manual Therapy: Massage Massage: Retro massage to L LE with elevation to decrease edema x 8' Cryotherapy Number Minutes Cryotherapy: 10 Minutes Cryotherapy Location: Knee (Left) Type of Cryotherapy: Ice pack  Physical Therapy Assessment and Plan PT Assessment and Plan Clinical Impression Statement: Pt completes therex well. Pt requires VC's with prone hip ext to avoid lumbar mm contraction. No standing exercises completed secondary to time.  Retromassage continued to decrease swelling. Ice applied at end of session to limt pain and swelling. Pt reprots pain decrease to 0/10 at end fo session. PT Plan: Continue to progress per PT POC. Return to standing exercsies next session.     Problem List Patient Active Problem List  Diagnoses  . OA (osteoarthritis) of knee  . Pain in joint, lower leg  . Stiffness of joint, lower leg  . Difficulty in walking  . Swelling of joint of left knee    PT - End of Session Activity Tolerance: Patient tolerated treatment well General Behavior During Session: [facility] for tasks performed Cognition: [facility] for tasks performed  Seth Bake, PTA 09/25/2011, 2:40 PM

## 2011-09-26 ENCOUNTER — Ambulatory Visit (HOSPITAL_COMMUNITY)
Admission: RE | Admit: 2011-09-26 | Discharge: 2011-09-26 | Disposition: A | Discharge status: Home / Self Care | Source: Ambulatory Visit | Attending: Internal Medicine | Admitting: Internal Medicine

## 2011-09-26 DIAGNOSIS — M25569 Pain in unspecified knee: Secondary | ICD-10-CM

## 2011-09-26 DIAGNOSIS — M25669 Stiffness of unspecified knee, not elsewhere classified: Secondary | ICD-10-CM

## 2011-09-26 DIAGNOSIS — M25462 Effusion, left knee: Secondary | ICD-10-CM

## 2011-09-26 DIAGNOSIS — R262 Difficulty in walking, not elsewhere classified: Secondary | ICD-10-CM

## 2011-09-26 NOTE — Progress Notes (Signed)
Physical Therapy Treatment Patient Details  Name: Stephanie Jarvis MRN: [id number] Date of Birth: Dec 31, 1950  Today's Date: 09/26/2011 Time: 1308-6578 Time Calculation (min): 68 min Visit#: 5  of 18   Re-eval: 10/16/11  Charge: therex 43 min Manual 15 min Ice 10 min  Subjective: Symptoms/Limitations Symptoms: Pt reports she is feeling better than yesterday, not as stiff today. Pain Assessment Currently in Pain?: No/denies  Objective:   Exercise/Treatments Aerobic Stationary Bike: 8' total starting with rocking, then backwards then able to make full revolution Standing Terminal Knee Extension: 10 reps;Theraband Theraband Level (Terminal Knee Extension): Level 4 (Blue) Functional Squat: Limitations Functional Squat Limitations: begin next session Rocker Board: 2 minutes Seated Long Arc Quad: 10 reps Supine Quad Sets: Left;10 reps;Limitations Quad Sets Limitations: 5" holds each direction Short Arc Quad Sets: Left;5 reps Heel Slides: Left;10 reps;Limitations Heel Slides Limitations: 5" holds Hip Adduction Isometric: 10 reps;Limitations Hip Adduction Isometric Limitations: squeeze ball 5" holds 10 reps Bridges: 10 reps Straight Leg Raises: 10 reps Patellar Mobs: superior/inferior patellar mobs, grade I-III tib/fib joint mobs to increase ROM and decrease pain Knee Extension: PROM;3 sets;Limitations Knee Extension Limitations: 3x 30" Knee Flexion: PROM;3 sets;Limitations Knee Flexion Limitations: 3x 30" Sidelying Hip ABduction: 10 reps Hip ABduction Limitations: hold 10 sec. Hip ADduction: Limitations Hip ADduction Limitations: time Prone  Hamstring Curl: 10 reps Hip Extension: 10 reps Contract/Relax to Increase Flexion: 3 reps 6" push, 10" holds   Modalities Modalities: Cryotherapy Manual Therapy Manual Therapy: Joint mobilization Edema Management: Retro massage to L LE with elevation to decrease edema x 10'. Joint Mobilization: Grade I-IV patellar mobs  superior/inferior, tib/fib A/P joint mobs to decrease adhesions to reduce pain and increase ROM. Cryotherapy Number Minutes Cryotherapy: 10 Minutes Cryotherapy Location: Knee (Left elevated) Type of Cryotherapy: Ice pack  Physical Therapy Assessment and Plan PT Assessment and Plan Clinical Impression Statement: Resumed standing exercises, pt tolerated well towards total treatment.  Added contract/relax in prone to increase flexion, pt tolerated well with new activity.  Able to increase ROM with manual joint mobs and decrease edema with retro massage at end of session.  Pt stated most difficulty at home is bending down to get low in cabinets, need to instruct functional squats next session to assist with impairment. PT Plan: Continue progressing per POC, begin functional squats next session.    Goals    Problem List Patient Active Problem List  Diagnoses  . OA (osteoarthritis) of knee  . Pain in joint, lower leg  . Stiffness of joint, lower leg  . Difficulty in walking  . Swelling of joint of left knee    PT - End of Session Activity Tolerance: Patient tolerated treatment well General Behavior During Session: [facility] for tasks performed Cognition: [facility] for tasks performed  GP No functional reporting required  Juel Burrow, PTA 09/26/2011, 3:55 PM

## 2011-09-30 ENCOUNTER — Ambulatory Visit (HOSPITAL_COMMUNITY)
Admission: RE | Admit: 2011-09-30 | Discharge: 2011-09-30 | Disposition: A | Discharge status: Home / Self Care | Source: Ambulatory Visit | Attending: Internal Medicine | Admitting: Internal Medicine

## 2011-09-30 DIAGNOSIS — M25569 Pain in unspecified knee: Secondary | ICD-10-CM | POA: Insufficient documentation

## 2011-09-30 DIAGNOSIS — R262 Difficulty in walking, not elsewhere classified: Secondary | ICD-10-CM | POA: Insufficient documentation

## 2011-09-30 DIAGNOSIS — IMO0001 Reserved for inherently not codable concepts without codable children: Secondary | ICD-10-CM | POA: Insufficient documentation

## 2011-09-30 DIAGNOSIS — M25469 Effusion, unspecified knee: Secondary | ICD-10-CM | POA: Insufficient documentation

## 2011-09-30 DIAGNOSIS — M25669 Stiffness of unspecified knee, not elsewhere classified: Secondary | ICD-10-CM | POA: Insufficient documentation

## 2011-09-30 NOTE — Progress Notes (Signed)
Physical Therapy Treatment Patient Details  Name: Stephanie Jarvis MRN: [id number] Date of Birth: 01/30/1951  Today's Date: 09/30/2011 Time: 1431-1530 Time Calculation (min): 59 min Visit#: 6  of 18   Re-eval: 10/16/11 Charges: Therex x 28' Manual x 12' Ice x 10'  Subjective: Symptoms/Limitations Symptoms: I drove to [facility] yesterday. I was a little stiff when I got out but it wasn't too bad. Pain Assessment Currently in Pain?: No/denies Pain Score: 0-No pain   Exercise/Treatments Aerobic Stationary Bike: 6' full revolution Standing Terminal Knee Extension: 10 reps;Theraband Theraband Level (Terminal Knee Extension): Level 4 (Blue) Lateral Step Up: 10 reps;Step Height: 4";Hand Hold: 2;Left Forward Step Up: 10 reps;Step Height: 4";Hand Hold: 1;Left Functional Squat: 15 reps Rocker Board: 2 minutes Seated Long Arc Quad: 15 reps;Weights Long Arc Quad Weight: 3 lbs.  Modalities Modalities: Cryotherapy Manual Therapy Manual Therapy: Joint mobilization Edema Management: Retro massage to L LE with elevation to decrease edema x 10' Joint Mobilization: Grade I-IV patellar mobs superior/inferior, tib/fib A/P joint mobs to decrease adhesions to reduce pain and increase ROM. (2') Cryotherapy Number Minutes Cryotherapy: 10 Minutes Cryotherapy Location: Knee (BLE elevated) Type of Cryotherapy: Ice pack  Physical Therapy Assessment and Plan PT Assessment and Plan Clinical Impression Statement: Unable to complete all mat exercises secondary to time. Pt presents with increased pitting edema. Edema greater around ankle and posterior leg than knee. Pt completes all standing exercises with minimal difficulty. Began forward and lateral step downs and functional squats to improve functional LE strength. Pt reports 0/10 pain at end of session. PT Plan: Continue to progress per PT POC.     Problem List Patient Active Problem List  Diagnoses  . OA (osteoarthritis) of knee  . Pain in  joint, lower leg  . Stiffness of joint, lower leg  . Difficulty in walking  . Swelling of joint of left knee    PT - End of Session Activity Tolerance: Patient tolerated treatment well General Behavior During Session: [facility] for tasks performed Cognition: [facility] for tasks performed    Seth Bake, PTA 09/30/2011, 3:40 PM

## 2011-10-02 ENCOUNTER — Ambulatory Visit (HOSPITAL_COMMUNITY)
Admission: RE | Admit: 2011-10-02 | Discharge: 2011-10-02 | Disposition: A | Discharge status: Home / Self Care | Source: Ambulatory Visit | Attending: Internal Medicine | Admitting: Internal Medicine

## 2011-10-02 NOTE — Progress Notes (Signed)
Physical Therapy Treatment Patient Details  Name: Stephanie Jarvis MRN: [id number] Date of Birth: September 12, 1950  Today's Date: 10/02/2011 Time: 1100-1145 Time Calculation (min): 45 min Visit#: 7  of 18   Re-eval: 10/16/11 Charges:  therex 25', manual 8', ice 10'    Subjective: Symptoms/Limitations Symptoms: Pt. states she is really hurting today, states she had to do alot of seated stuff yesterday and wasn't able to ice/elevate and got her stiff and swollen.  Currently 6/10 pain in L pain. Pain Assessment Currently in Pain?: Yes Pain Score:   6 Pain Location: Knee Pain Orientation: Left  Exercises Instructed by Trilby Leaver, SPTA under the direction of Brodrick Curran Bascom Levels, PTA/CI. Exercise/Treatments Aerobic Stationary Bike: 6' full revolution Standing Terminal Knee Extension: 10 reps;Theraband Theraband Level (Terminal Knee Extension): Level 4 (Blue) Lateral Step Up: 10 reps;Step Height: 4";Hand Hold: 2;Left Forward Step Up: 10 reps;Step Height: 4";Hand Hold: 1;Left Functional Squat: 15 reps Rocker Board: 2 minutes Seated Long Arc Quad: 15 reps;Weights Long Arc Quad Weight: 3 lbs. Supine Knee Extension: PROM;2 sets Knee Extension Limitations: 2X15" Knee Flexion: PROM;2 sets Knee Flexion Limitations: 2X15"    Modalities Modalities: Cryotherapy Manual Therapy Manual Therapy: Massage Edema Management: Retro massage to L LE with elevation; pt. only able to tolerate 6' due to pain today. Joint Mobilization: gentle PROM today for flexion/extension due to increased pain Massage: held mobs today due to too painful Cryotherapy Number Minutes Cryotherapy: 10 Minutes Cryotherapy Location: Knee Type of Cryotherapy: Ice pack  Physical Therapy Assessment and Plan PT Assessment and Plan Clinical Impression Statement: Pt. with increased pain today, tender to touch/manual techniques.  Unable to tolerate greater than 6' of massage.   Pt. was able to complete all standing exercises  without difficulty but no new activities were added.  Pt. Reported being painfree at end of session. PT Plan: Resume all mat activities next visit and progress as able.     PT - End of Session Activity Tolerance: Patient limited by pain General Behavior During Session: [facility] for tasks performed Cognition: [facility] for tasks performed   Trilby Leaver, SPTA/ Johnnie Goynes B. Bascom Levels, PTA 10/02/2011, 11:43 AM

## 2011-10-03 ENCOUNTER — Ambulatory Visit (HOSPITAL_COMMUNITY)
Admission: RE | Admit: 2011-10-03 | Discharge: 2011-10-03 | Disposition: A | Discharge status: Home / Self Care | Source: Ambulatory Visit | Attending: Internal Medicine | Admitting: Internal Medicine

## 2011-10-03 DIAGNOSIS — M25462 Effusion, left knee: Secondary | ICD-10-CM

## 2011-10-03 DIAGNOSIS — R262 Difficulty in walking, not elsewhere classified: Secondary | ICD-10-CM

## 2011-10-03 DIAGNOSIS — M25669 Stiffness of unspecified knee, not elsewhere classified: Secondary | ICD-10-CM

## 2011-10-03 DIAGNOSIS — M25569 Pain in unspecified knee: Secondary | ICD-10-CM

## 2011-10-03 NOTE — Progress Notes (Signed)
Physical Therapy Treatment Patient Details  Name: Stephanie Jarvis MRN: [id number] Date of Birth: 10/28/1950  Today's Date: 10/03/2011 Time:  -    Visit#:   of    Re-eval:      Subjective: Symptoms/Limitations Symptoms: Pt is still sore from the sitting on Tuesday.  She has been icing and elevating today. Pain Assessment Pain Score:   2 Pain Location: Knee Pain Orientation: Left Pain Type: Chronic pain   Exercise/Treatments   Aerobic Stationary Bike: 6' full revolution Standing Heel Raises: 10 reps Terminal Knee Extension: 10 reps;Theraband Theraband Level (Terminal Knee Extension): Level 4 (Blue) Lateral Step Up: 10 reps;Step Height: 4";Hand Hold: 2;Left Functional Squat: 10 reps Rocker Board: 2 minutes Seated Long Arc Quad: 10 reps Other Seated Knee Exercises: Chair scoot x 1 Supine Quad Sets: 10 reps Terminal Knee Extension: 10 reps Knee Extension: PROM;2 sets Knee Extension Limitations: 2X15" Sidelying Hip ABduction: 10 reps Prone  Hamstring Curl: 10 reps Hip Extension: 10 reps Other Prone Exercises: PROM x 3    Manual Therapy Manual Therapy: Massage Edema Management: retromassage Cryotherapy Number Minutes Cryotherapy: 10 Minutes Cryotherapy Location: Knee Type of Cryotherapy: Ice pack  Physical Therapy Assessment and Plan    Pt improving functionally.  Improved gait.  Goals    Problem List Patient Active Problem List  Diagnoses  . OA (osteoarthritis) of knee  . Pain in joint, lower leg  . Stiffness of joint, lower leg  . Difficulty in walking  . Swelling of joint of left knee     GP No functional reporting required  Ecko Beasley,CINDY 10/03/2011, 4:16 PM

## 2011-10-07 ENCOUNTER — Ambulatory Visit (HOSPITAL_COMMUNITY)
Admission: RE | Admit: 2011-10-07 | Discharge: 2011-10-07 | Disposition: A | Discharge status: Home / Self Care | Source: Ambulatory Visit | Attending: Internal Medicine | Admitting: Internal Medicine

## 2011-10-07 NOTE — Progress Notes (Signed)
Physical Therapy Treatment Patient Details  Name: IJANAE MACAPAGAL MRN: [id number] Date of Birth: 18-Nov-1950  Today's Date: 10/07/2011 Time: 4098-1191 Time Calculation (min): 44 min Visit#: 9  of 18   Re-eval: 10/16/11 Charges:  therex 28', ice 10'  Subjective: Symptoms/Limitations Symptoms: Pt. reports it's getting better; much better than last week. Pain Assessment Currently in Pain?: No/denies   Exercise/Treatments Aerobic Stationary Bike: 6' full revolution Standing Heel Raises: 15 reps Terminal Knee Extension: 15 reps Theraband Level (Terminal Knee Extension): Level 4 (Blue) Lateral Step Up: 15 reps Forward Step Up: 15 reps;Step Height: 4" Functional Squat: 15 reps Rocker Board: 2 minutes Seated Long Arc Quad: 15 reps Long Arc Quad Weight: 3 lbs. Other Seated Knee Exercises: Chair scoot x 1 Supine Quad Sets: 10 reps Terminal Knee Extension: 10 reps Straight Leg Raises: 15 reps Knee Extension: PROM;2 sets Knee Extension Limitations: 3X30" Knee Flexion: PROM;2 sets Knee Flexion Limitations: 3X30" Sidelying Hip ABduction: 15 reps Prone  Hamstring Curl: 15 reps Hip Extension: 15 reps  Modalities Modalities: Cryotherapy Cryotherapy Number Minutes Cryotherapy: 10 Minutes Cryotherapy Location: Knee Type of Cryotherapy: Ice pack  Physical Therapy Assessment and Plan PT Assessment and Plan Clinical Impression Statement: Pt. with overall increased ROM, decreased swelling and tenderness.  Able to increase reps of therex without difficulty. Held massage due to edema reduction. PT Plan: Continue per POC.      Trilby Leaver, SPTA/ Fifi Schindler B. Bascom Levels, PTA 10/07/2011, 3:13 PM

## 2011-10-09 ENCOUNTER — Ambulatory Visit (HOSPITAL_COMMUNITY): Admitting: *Deleted

## 2011-10-11 ENCOUNTER — Ambulatory Visit (HOSPITAL_COMMUNITY)
Admission: RE | Admit: 2011-10-11 | Discharge: 2011-10-11 | Disposition: A | Discharge status: Home / Self Care | Source: Ambulatory Visit | Attending: Physical Therapy | Admitting: Physical Therapy

## 2011-10-11 DIAGNOSIS — R262 Difficulty in walking, not elsewhere classified: Secondary | ICD-10-CM

## 2011-10-11 DIAGNOSIS — M25669 Stiffness of unspecified knee, not elsewhere classified: Secondary | ICD-10-CM

## 2011-10-11 DIAGNOSIS — M25569 Pain in unspecified knee: Secondary | ICD-10-CM

## 2011-10-11 DIAGNOSIS — M25462 Effusion, left knee: Secondary | ICD-10-CM

## 2011-10-11 NOTE — Progress Notes (Signed)
Physical Therapy Treatment Patient Details  Name: Stephanie Jarvis MRN: [id number] Date of Birth: December 28, 1950  Today's Date: 10/11/2011 Time: 8295-6213 Time Calculation (min): 51 min Visit#: 10  of 18   Re-eval: 10/16/11    Subjective: Symptoms/Limitations Symptoms: My main problem is bending my knee to ger into lower cabinets.   Exercise/Treatments      Stretches Lobbyist: 3 reps;30 seconds Aerobic Stationary Bike: 6' full revolution    Standing Knee Flexion: Right;10 reps Forward Lunges:  (on chair) Functional Squat Limitations:  (Picking up soccer ball off 6" step) Rocker Board: 2 minutes Seated Other Seated Knee Exercises: Chair scoot x 1 Supine Quad Sets: 10 reps Terminal Knee Extension: 10 reps Knee Extension: PROM;2 sets Knee Extension Limitations: 3X30" Knee Flexion: PROM;2 sets Knee Flexion Limitations: 3X30" Other Supine Knee Exercises: LE on therapist shoulder push out putll in x 10   Prone  Hamstring Curl: 15 reps Other Prone Exercises: contract relax x 10 Other Prone Exercises: PROM  Modalities Modalities: Cryotherapy Manual Therapy Manual Therapy: Edema management Edema Management: retromassage x 6 min while PROM for hamstretch Cryotherapy Number Minutes Cryotherapy: 10 Minutes Cryotherapy Location: Knee Type of Cryotherapy: Ice pack  Physical Therapy Assessment and Plan PT Assessment and Plan Clinical Impression Statement: Pt improving with functional mobility; increased edema so retro massage and ice were given today. PT Plan: Continue with functional activites to improve ROM and gaitl    Goals    Problem List Patient Active Problem List  Diagnoses  . OA (osteoarthritis) of knee  . Pain in joint, lower leg  . Stiffness of joint, lower leg  . Difficulty in walking  . Swelling of joint of left knee    PT - End of Session Activity Tolerance: Patient tolerated treatment well General Behavior During Session: [facility] for tasks  performed Cognition: [facility] for tasks performed  GP No functional reporting required  Sena Hoopingarner,CINDY 10/11/2011, 4:23 PM

## 2011-10-14 ENCOUNTER — Ambulatory Visit (HOSPITAL_COMMUNITY)
Admission: RE | Admit: 2011-10-14 | Discharge: 2011-10-14 | Disposition: A | Discharge status: Home / Self Care | Source: Ambulatory Visit | Attending: *Deleted | Admitting: *Deleted

## 2011-10-14 DIAGNOSIS — M25462 Effusion, left knee: Secondary | ICD-10-CM

## 2011-10-14 DIAGNOSIS — M25569 Pain in unspecified knee: Secondary | ICD-10-CM

## 2011-10-14 DIAGNOSIS — R262 Difficulty in walking, not elsewhere classified: Secondary | ICD-10-CM

## 2011-10-14 DIAGNOSIS — M25669 Stiffness of unspecified knee, not elsewhere classified: Secondary | ICD-10-CM

## 2011-10-14 NOTE — Progress Notes (Signed)
Physical Therapy Treatment Patient Details  Name: Stephanie Jarvis MRN: [id number] Date of Birth: Feb 15, 1951  Today's Date: 10/14/2011 Time: 7829-5621 Time Calculation (min): 66 min Visit#: 11  of 18   Re-eval: 10/16/11  Charge: therex 50 min Ice 10 min  Subjective: Pt ambulated into dept with no AD, pt stated no LOB episodes. No pain at entrance, pt c/o most pain at night     Objective:   Exercise/Treatments Stretches Quad Stretch: 3 reps;30 seconds Knee: Self-Stretch to increase Flexion: 3 reps;30 seconds Aerobic Stationary Bike: 8' full revolution @ 2.0 Standing Knee Flexion: Right;10 reps Lateral Step Up: Left;15 reps;Hand Hold: 2;Step Height: 4" Forward Step Up: 15 reps;Hand Hold: 1;Step Height: 4" Step Down: 10 reps;Hand Hold: 1;Step Height: 4" Functional Squat: Limitations Functional Squat Limitations: pick up soccer ball off 6 in step 12 reps Stairs: steps in dept reciprocally 2 RT Rocker Board: 2 minutes Supine Quad Sets: 10 reps Heel Slides: 10 reps Knee Extension: PROM;3 sets Knee Extension Limitations: 3X30" Knee Flexion: PROM;3 sets Knee Flexion Limitations: 3X30" Prone  Hamstring Curl: 15 reps;Limitations Hamstring Curl Limitations: 3# Hip Extension: 15 reps Other Prone Exercises: contract relax x 10 Other Prone Exercises: PROM   Modalities Modalities: Cryotherapy Cryotherapy Number Minutes Cryotherapy: 10 Minutes Cryotherapy Location: Knee Type of Cryotherapy: Ice pack  Physical Therapy Assessment and Plan PT Assessment and Plan Clinical Impression Statement: Pt overall functional strength and ROM improving with better gait mechanics noted. Pt able to complete the 4in step training with less HHA and vc-ing for form required. Began stairwell training, pt able to complete reciprocally without difficulty with noted weak eccentric control descending PT Plan: Continue with current POC to improve functional activities with strength, ROM and gait.   Progress to stairwell in hospital next session.    Goals    Problem List Patient Active Problem List  Diagnoses  . OA (osteoarthritis) of knee  . Pain in joint, lower leg  . Stiffness of joint, lower leg  . Difficulty in walking  . Swelling of joint of left knee    PT - End of Session Activity Tolerance: Patient tolerated treatment well General Behavior During Session: [facility] for tasks performed Cognition: [facility] for tasks performed  GP No functional reporting required  Juel Burrow, PTA 10/14/2011, 6:53 PM

## 2011-10-16 ENCOUNTER — Ambulatory Visit (HOSPITAL_COMMUNITY)
Admission: RE | Admit: 2011-10-16 | Discharge: 2011-10-16 | Disposition: A | Discharge status: Home / Self Care | Source: Ambulatory Visit | Attending: Internal Medicine | Admitting: Internal Medicine

## 2011-10-16 NOTE — Progress Notes (Signed)
Physical Therapy Treatment Patient Details  Name: Stephanie Jarvis MRN: [id number] Date of Birth: Nov 21, 1950  Today's Date: 10/16/2011 Time: 6433-2951 Time Calculation (min): 45 min Visit#: 12  of 18   Re-eval: 10/28/11 Diagnosis: L TKR Surgical Date: 08/12/11 Next MD Visit: 11/05/11 Charges:  therex 34', ice 10'  Subjective: Symptoms/Limitations Symptoms: Pt. reports her knee feels achey but no real pain today. Pain Assessment Currently in Pain?: No/denies  Exercises Instructed by Trilby Leaver, SPTA under the direction of Anise Harbin Bascom Levels, PTA/CI. Exercise/Treatments Standing Lateral Step Up: Left;15 reps;Hand Hold: 2;Step Height: 6" Forward Step Up: 15 reps;Hand Hold: 1;Step Height: 6" Step Down: 10 reps;Hand Hold: 1;Step Height: 4" Functional Squat: 10 reps;Limitations Functional Squat Limitations: pick up soccer ball off 6 in step 12 reps Stairs: 1 flight in hospital stairwell, 1 HHA, reciprocally up/down Rocker Board: 2 minutes;Limitations Rocker Board Limitations: finger tip hold Supine Quad Sets: 10 reps Heel Slides: 10 reps Knee Extension: PROM;3 sets Knee Extension Limitations: 3X30" Knee Flexion: PROM;3 sets Knee Flexion Limitations: 3X30" Prone  Hamstring Curl: 15 reps Hamstring Curl Limitations: 4# Hip Extension: 15 reps Other Prone Exercises: contract relax x 10 Other Prone Exercises: PROM     Physical Therapy Assessment and Plan PT Assessment and Plan Clinical Impression Statement: Able to increase to 6" steps today, however forward step downs remain difficult for patient.   Plan:  Continue X 4 more visits then re-eval.   PT - End of Session Activity Tolerance: Patient tolerated treatment well General Behavior During Session: [facility] for tasks performed Cognition: [facility] for tasks performed PT Plan of Care PT Home Exercise Plan: Continue X 4 more visit then re-eval for MD appt.    Trilby Leaver, SPTA/ Julis Haubner B. Bascom Levels, PTA 10/16/2011, 3:36  PM

## 2011-10-18 ENCOUNTER — Ambulatory Visit (HOSPITAL_COMMUNITY): Admitting: Physical Therapy

## 2011-10-21 ENCOUNTER — Ambulatory Visit (HOSPITAL_COMMUNITY)
Admission: RE | Admit: 2011-10-21 | Discharge: 2011-10-21 | Disposition: A | Discharge status: Home / Self Care | Source: Ambulatory Visit | Attending: Internal Medicine | Admitting: Internal Medicine

## 2011-10-21 DIAGNOSIS — M25669 Stiffness of unspecified knee, not elsewhere classified: Secondary | ICD-10-CM

## 2011-10-21 DIAGNOSIS — M25462 Effusion, left knee: Secondary | ICD-10-CM

## 2011-10-21 DIAGNOSIS — R262 Difficulty in walking, not elsewhere classified: Secondary | ICD-10-CM

## 2011-10-21 DIAGNOSIS — M25569 Pain in unspecified knee: Secondary | ICD-10-CM

## 2011-10-21 NOTE — Progress Notes (Signed)
Physical Therapy Treatment Patient Details  Name: Stephanie Jarvis MRN: [id number] Date of Birth: Feb 23, 1951  Today's Date: 10/21/2011 Time:  - Pt not seen due to having norovirus sx within 24 hours of treatment time.   Visit#: 12  of 18   Re-eval: 10/28/11    Subjective: Symptoms/Limitations Symptoms: Pt states that she was sick over the weekend.  States last sx were yesterday.  Therapist explained that pt needed to wait 72 hours after sx have stopped to return to therapy.   Exercise/Treatments-  No treatment given today.   Physical Therapy Assessment and Plan PT Assessment and Plan Clinical Impression Statement: no treatment given today secondary to pt having norovirus symptoms.    Goals    Problem List Patient Active Problem List  Diagnoses  . OA (osteoarthritis) of knee  . Pain in joint, lower leg  . Stiffness of joint, lower leg  . Difficulty in walking  . Swelling of joint of left knee     GP No functional reporting required  Emylee Decelle,CINDY 10/21/2011, 1:13 PM

## 2011-10-23 ENCOUNTER — Ambulatory Visit (HOSPITAL_COMMUNITY)
Admission: RE | Admit: 2011-10-23 | Discharge: 2011-10-23 | Disposition: A | Discharge status: Home / Self Care | Source: Ambulatory Visit | Attending: Internal Medicine | Admitting: Internal Medicine

## 2011-10-23 ENCOUNTER — Other Ambulatory Visit (HOSPITAL_COMMUNITY): Payer: Self-pay | Admitting: Internal Medicine

## 2011-10-23 DIAGNOSIS — Z139 Encounter for screening, unspecified: Secondary | ICD-10-CM

## 2011-10-23 DIAGNOSIS — C50919 Malignant neoplasm of unspecified site of unspecified female breast: Secondary | ICD-10-CM

## 2011-10-23 DIAGNOSIS — Z0000 Encounter for general adult medical examination without abnormal findings: Secondary | ICD-10-CM

## 2011-10-23 NOTE — Progress Notes (Addendum)
Physical Therapy Treatment Patient Details  Name: Stephanie Jarvis MRN: [id number] Date of Birth: Nov 01, 1950  Today's Date: 10/23/2011 Time: 4098-1191 Time Calculation (min): 61 min Visit#: 13  of 18   Re-eval: 10/28/11 Charges: Therex x 42' Ice x 10'   Subjective: Symptoms/Limitations Symptoms: Pt is ppain free. Pt states that she has not had any stomach sx since Sunday. Pain Assessment Currently in Pain?: No/denies Pain Score: 0-No pain   Exercise/Treatments Aerobic Stationary Bike: 6' full revolution @ 2.0 Standing Lateral Step Up: Left;15 reps;Hand Hold: 2;Step Height: 6" Forward Step Up: 15 reps;Hand Hold: 1;Step Height: 6" Step Down: Hand Hold: 1;Step Height: 4";15 reps Functional Squat Limitations: pick up soccer ball off 6 in step 15 reps Stairs: 1 flight in hospital stairwell, 1 HHA, reciprocally up/down Rocker Board: 2 minutes;Limitations Rocker Board Limitations: finger tip hold Supine Quad Sets: 15 reps Short Arc Quad Sets: 15 reps;Limitations Short Arc Quad Sets Limitations: 5" each working onend range of ext Heel Slides: 10 reps Knee Extension: PROM;3 sets Knee Extension Limitations: 3X30" Knee Flexion: PROM;3 sets Knee Flexion Limitations: 3X30" Prone  Other Prone Exercises: PROM w/ distraction   Physical Therapy Assessment and Plan PT Assessment and Plan Clinical Impression Statement: Pt completes all exercises well with minimal need for cueing. Large spasm noted on L medial hamstring. MFR completed for 1-2 minutes to spasm to decrease pain/tightness. Spasm resolved after manual and pain with ext decreased. Ice applied at end of session to limit pain and swelling.  PT Plan: Reassess next session.     Problem List Patient Active Problem List  Diagnoses  . OA (osteoarthritis) of knee  . Pain in joint, lower leg  . Stiffness of joint, lower leg  . Difficulty in walking  . Swelling of joint of left knee    PT - End of Session Activity  Tolerance: Patient tolerated treatment well General Behavior During Session: [facility] for tasks performed Cognition: [facility] for tasks performed   Seth Bake, PTA 10/23/2011, 2:20 PM

## 2011-10-25 ENCOUNTER — Ambulatory Visit (HOSPITAL_COMMUNITY)
Admission: RE | Admit: 2011-10-25 | Discharge: 2011-10-25 | Disposition: A | Discharge status: Home / Self Care | Source: Ambulatory Visit | Attending: Physical Therapy | Admitting: Physical Therapy

## 2011-10-25 DIAGNOSIS — R262 Difficulty in walking, not elsewhere classified: Secondary | ICD-10-CM

## 2011-10-25 DIAGNOSIS — M25669 Stiffness of unspecified knee, not elsewhere classified: Secondary | ICD-10-CM

## 2011-10-25 DIAGNOSIS — M25462 Effusion, left knee: Secondary | ICD-10-CM

## 2011-10-25 DIAGNOSIS — M25569 Pain in unspecified knee: Secondary | ICD-10-CM

## 2011-10-25 NOTE — Evaluation (Signed)
Physical Therapy Evaluation  Patient Details  Name: Stephanie Jarvis MRN: [id number] Date of Birth: 13-Jul-1950  Today's Date: 10/25/2011 Time: 4098-1191 Time Calculation (min): 39 min  Visit#: 14  of 14   Re-eval:   Assessment Diagnosis: L TKR Surgical Date: 08/12/11 Next MD Visit: 11/05/11  Past Medical History:  Past Medical History  Diagnosis Date  . Arthritis   . Knee pain   . Ankle pain   . Irregular heartbeat   . High cholesterol   . Complication of anesthesia     Aspirates when having per patient  . Dysrhythmia 2011    Atrial Fibrillation  . Hepatitis     Hepatitis A when 61 years old  . GERD (gastroesophageal reflux disease)   . H/O hiatal hernia 2003   Past Surgical History:  Past Surgical History  Procedure Date  . Tubal ligation 1980  . Breast lumpectomy 4782,9562    x 2   . Hiatal hernia repair 2003  . Lymph node removal 2008  . Total knee arthroplasty 2006  . Total knee arthroplasty 08/12/2011    Procedure: TOTAL KNEE ARTHROPLASTY;  Surgeon: Fuller Canada, MD;  Location: [facility];  Service: Orthopedics;  Laterality: Left;    Subjective Symptoms/Limitations Symptoms: The patient states she is not having difficulty doing anything except vaccum. How long can you sit comfortably?: The patient is able to sit for over two hours without difficulty she was able to sit for an hour. How long can you stand comfortably?: Pt is limited in standing due to her back problems.  She is able to stand for thirty minutes where she was only able to stand for fifteen minutes. How long can you walk comfortably?: The patient is able to walk for over an hour now where she was only able to walk for 20 minutes. Special Tests: The patient states that she is sleeping at night now.  She has decreased her pain meds by half. Pain Assessment Currently in Pain?: No/denies Pain Orientation: Left  Sensation/Coordination/Flexibility/Functional Tests Functional Tests Functional Tests:  LEFS 60/66 was 35  Assessment LLE PROM (degrees) Left Knee Extension 0-130: 2 Left Knee Flexion 0-140: 110 LLE Strength Left Hip Flexion: 5/5 Left Hip Extension:  (3+/5 was 2+/5; R is 3+/5) Left Hip ABduction: 5/5 (was 3/5) Left Knee Flexion:  (4+5 was 3/5) Left Knee Extension: 5/5 (was 3+5) Left Ankle Dorsiflexion: 5/5 (was 3+/5)  Exercise/Treatments   B hip extension x 15  Physical Therapy Assessment and Plan PT Assessment and Plan Clinical Impression Statement: Goals have been met.  Pt no longer is in need of skilled PT PT Plan: D/C    Goals Home Exercise Program Pt will Perform Home Exercise Program: Independently PT Short Term Goals PT Short Term Goal 1 - Progress: Met PT Short Term Goal 2: Knee more swollen today due to pt being up on it ROM 2-110; Pt is able to pick items off the floor. PT Short Term Goal 2 - Progress: Progressing toward goal PT Long Term Goals PT Long Term Goal 1 - Progress: Met PT Long Term Goal 2: Able to ride in a car for two hours but ROM is not at 120 yet. PT Long Term Goal 2 - Progress: Progressing toward goal Long Term Goal 3 Progress: Met Long Term Goal 4 Progress: Met Long Term Goal 5 Progress: Met  Problem List Patient Active Problem List  Diagnoses  . OA (osteoarthritis) of knee  . Pain in joint, lower leg  . Stiffness  of joint, lower leg  . Difficulty in walking  . Swelling of joint of left knee    PT - End of Session Activity Tolerance: Patient tolerated treatment well General Behavior During Session: [facility] for tasks performed Cognition: [facility] for tasks performed    Duilio Heritage,CINDY 10/25/2011, 4:51 PM  Physician Documentation Your signature is required to indicate approval of the treatment plan as stated above.  Please sign and either send electronically or make a copy of this report for your files and return this physician signed original.   Please mark one 1.__approve of plan  2. ___approve of plan with the following  conditions.   ______________________________                                                          _____________________ Physician Signature                                                                                                             Date

## 2011-10-28 ENCOUNTER — Ambulatory Visit (HOSPITAL_COMMUNITY)
Admission: RE | Admit: 2011-10-28 | Discharge: 2011-10-28 | Disposition: A | Discharge status: Home / Self Care | Source: Ambulatory Visit | Attending: Internal Medicine | Admitting: Internal Medicine

## 2011-10-28 DIAGNOSIS — Z0000 Encounter for general adult medical examination without abnormal findings: Secondary | ICD-10-CM

## 2011-10-28 DIAGNOSIS — Z139 Encounter for screening, unspecified: Secondary | ICD-10-CM

## 2011-10-31 ENCOUNTER — Ambulatory Visit (HOSPITAL_COMMUNITY): Admitting: *Deleted

## 2011-11-05 ENCOUNTER — Ambulatory Visit (INDEPENDENT_AMBULATORY_CARE_PROVIDER_SITE_OTHER): Admitting: Orthopedic Surgery

## 2011-11-05 ENCOUNTER — Encounter: Payer: Self-pay | Admitting: Orthopedic Surgery

## 2011-11-05 VITALS — BP 124/76 | Ht 66.0 in | Wt 232.0 lb

## 2011-11-05 DIAGNOSIS — Z96659 Presence of unspecified artificial knee joint: Secondary | ICD-10-CM

## 2011-11-05 DIAGNOSIS — M1710 Unilateral primary osteoarthritis, unspecified knee: Secondary | ICD-10-CM

## 2011-11-05 DIAGNOSIS — M179 Osteoarthritis of knee, unspecified: Secondary | ICD-10-CM

## 2011-11-05 NOTE — Patient Instructions (Signed)
Activity as tolerated

## 2011-11-05 NOTE — Progress Notes (Signed)
Subjective:     Patient ID: Stephanie Jarvis, female   DOB: [birth year], 61 y.o.   MRN: [id number] Chief Complaint  Patient presents with  . Follow-up    6 week follow up left TKA, DOS 08/12/11    HPI Patient had knee replacement surgery. She is doing well. She has no complaints regarding the knee.   Review of Systems Negative    Objective:   Physical Exam Ambulatory without assistive device at this point. There is no limp noted. Her knee looks good. Her range of motion is 115 her knee is stable. The strength is normal. The skin incision is intact had good pulse and good perfusion.       Assessment:     Status post LEFT total knee doing well    Plan:     X-ray in February of 2014

## 2011-11-13 ENCOUNTER — Ambulatory Visit (HOSPITAL_COMMUNITY)
Admission: RE | Admit: 2011-11-13 | Discharge: 2011-11-13 | Disposition: A | Discharge status: Home / Self Care | Source: Ambulatory Visit | Attending: Internal Medicine | Admitting: Internal Medicine

## 2011-11-13 DIAGNOSIS — Z853 Personal history of malignant neoplasm of breast: Secondary | ICD-10-CM | POA: Insufficient documentation

## 2011-11-13 DIAGNOSIS — C50919 Malignant neoplasm of unspecified site of unspecified female breast: Secondary | ICD-10-CM

## 2012-04-01 ENCOUNTER — Ambulatory Visit (INDEPENDENT_AMBULATORY_CARE_PROVIDER_SITE_OTHER)

## 2012-04-01 ENCOUNTER — Ambulatory Visit (INDEPENDENT_AMBULATORY_CARE_PROVIDER_SITE_OTHER): Admitting: Orthopedic Surgery

## 2012-04-01 ENCOUNTER — Encounter: Payer: Self-pay | Admitting: Orthopedic Surgery

## 2012-04-01 VITALS — BP 90/62 | Ht 66.0 in | Wt 238.0 lb

## 2012-04-01 DIAGNOSIS — M6789 Other specified disorders of synovium and tendon, multiple sites: Secondary | ICD-10-CM

## 2012-04-01 DIAGNOSIS — M25571 Pain in right ankle and joints of right foot: Secondary | ICD-10-CM

## 2012-04-01 DIAGNOSIS — M25579 Pain in unspecified ankle and joints of unspecified foot: Secondary | ICD-10-CM

## 2012-04-01 DIAGNOSIS — M76829 Posterior tibial tendinitis, unspecified leg: Secondary | ICD-10-CM

## 2012-04-01 NOTE — Progress Notes (Signed)
Patient ID: Stephanie Jarvis, female   DOB: 1950-11-18, 61 y.o.   MRN: [id number] Chief Complaint  Patient presents with  . Ankle Pain    right ankle pain, gradual onset x 2 years, no known injury      61 years old, status post knee replacement, presents with a RIGHT ankle and foot pain. The patient was treated for posterior tibial tendon dysfunction, approximately 2 years ago, wearing a custom-made AFO. After 6 weeks. She returned to her physician, who recommended surgical treatment, but at that time as things seem to be getting better.  She now presents with continued dull, throbbing, medial ankle pain, which is 7/10. It is constant and worse with ambulation. She has a significant amount of foot deformity with medial swelling and some lateral ankle pain. When she has stood for a long period of time.  Review of systems positive findings include occasional palpitations, heartburn, anxiety, seasonal allergies. She denies unexpected weight loss, blurred vision, shortness of breath, frequency, skin changes, numbness or tingling in the foot.  Past Medical History  Diagnosis Date  . Arthritis   . Knee pain   . Ankle pain   . Irregular heartbeat   . High cholesterol   . Complication of anesthesia     Aspirates when having per patient  . Dysrhythmia 2011    Atrial Fibrillation  . Hepatitis     Hepatitis A when 61 years old  . GERD (gastroesophageal reflux disease)   . H/O hiatal hernia 2003    Past Surgical History  Procedure Date  . Tubal ligation 1980  . Breast lumpectomy 4098,1191    x 2   . Hiatal hernia repair 2003  . Lymph node removal 2008  . Total knee arthroplasty 2006  . Total knee arthroplasty 08/12/2011    Procedure: TOTAL KNEE ARTHROPLASTY;  Surgeon: Fuller Canada, MD;  Location: [facility];  Service: Orthopedics;  Laterality: Left;    BP 90/62  Ht 5\' 6"  (1.676 m)  Wt 238 lb (107.956 kg)  BMI 38.41 kg/m2 Vital signs are stable as recorded  General appearance is  normal  The patient is alert and oriented x3  The patient's mood and affect are normal  Gait assessment: She does not have a significant amount of limping, but there is a [employer]. Gait disturbance, favoring the RIGHT lower extremity and there is significant foot deformity on the RIGHT and mild changes on the LEFT The cardiovascular exam reveals normal pulses and temperature without edema swelling.  The lymphatic system is negative for palpable lymph nodes  The sensory exam is normal.  There are no pathologic reflexes.  Balance is normal.   Exam of the RIGHT foot and ankle Significant pes planus with valgus foot deformity, tenderness in the inferior to the lateral malleolus, as well as on the medial side of the ankle joint, which is swollen at the posterior tibial tendon, which is tender and weak. Ankle joint motion remains intact. Subtalar joint motion remains painful with valgus. Skin is intact and normal.  The patient has a positive too many toes sign and a positive single-leg heel raise sign with inability to perform even one repetition  The LEFT ankle and foot deformity is much less though there is still flexible pes planus, normal range of motion, ankle joint. Stability is normal. Strength is normal. Skin is normal.  Ankle x-rays: The ankle joint seems to be intact without significant degenerative changes. There are some mild degenerative changes in the midfoot  subtalar joint may have some subtle degenerative changes, but the joint is by no means obliterated.  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  Recommend bracing, and referral to a foot and ankle specialist.

## 2012-04-01 NOTE — Patient Instructions (Addendum)
Wear cam walker   Referral to Dr Victorino Dike for PTTD [posterior tibial tendon dysfunction]  Posterior Tibial Tendon Tendinitis with Rehab Tendonitis is a condition that is characterized by inflammation of a tendon or the lining (sheath) that surrounds it. The inflammation is usually caused by damage to the tendon, such as a tendon tear (strain). Sprains are classified into three categories. Grade 1 sprains cause pain, but the tendon is not lengthened. Grade 2 sprains include a lengthened ligament due to the ligament being stretched or partially ruptured. With grade 2 sprains there is still function, although the function may be diminished. Grade 3 sprains are characterized by a complete tear of the tendon or muscle, and function is usually impaired. Posterior tibialis tendonitis is tendonitis of the posterior tibial tendon, which attaches muscles of the lower leg to the foot. The posterior tibial tendon is located in the back of the ankle and helps the body straighten (plantarflex) and rotate inward (medially rotate) the ankle. SYMPTOMS    Pain, tenderness, swelling, warmth, and/or redness over the back of the inner ankle at the posterior tibial tendon or the inner part of the mid-foot.   Pain that worsens with plantarflexion or medial rotation of the ankle.   A crackling sound (crepitation) when the tendon is moved or touched.  CAUSES   Posterior tibial tendonitis occurs when damage to the posterior tibial tendon starts an inflammatory response. Common mechanisms of injury include:  Degenerative (occurs with aging) processes that weaken the tendon and make it more susceptible to injury.   Stress placed on the tendon from an increase in the intensity, frequency, or duration of training.   Direct trauma to the ankle.   Returning to activity before a previous ankle injury is allowed to heal.  RISK INCREASES WITH:  Activities that involve repetitive and/or stressful plantarflexion (jumping,  kicking, or running up/down hills).   Poor strength and flexibility.   Flat feet.   Previous injury to the foot, ankle, or leg.  PREVENTION    Warm up and stretch properly before activity.   Allow for adequate recovery between workouts.   Maintain physical fitness:   Strength, flexibility, and endurance.   Cardiovascular fitness.   Learn and use proper technique. When possible, have a coach correct improper technique.   Complete rehabilitation from a previous foot, ankle, or leg injury.   If you have flat feet, wear arch supports (orthotics).  PROGNOSIS   If treated properly, then the symptoms of tendonitis usually resolve within 6 weeks. This period may be shorter for injuries caused by direct trauma. RELATED COMPLICATIONS    Prolonged healing time, if improperly treated or re-injured.   Recurrent symptoms that result in a chronic problem.   Partial or complete tendon tear (rupture) requiring surgery.  TREATMENT   Treatment initially involves the use of ice and medication to help reduce pain and inflammation. The use of strengthening and stretching exercises may help reduce pain with activity. These exercises may be performed at home or with referral to a therapist. Often times, your caregiver will recommend immobilizing the ankle to allow the tendon to heal. If you have flat feet, the you may be advised to wear orthotic arch supports. If symptoms persist for greater than 6 months despite non-surgical (conservative) treatment, then surgery may be recommended. MEDICATION    If pain medication is necessary, then nonsteroidal anti-inflammatory medications, such as aspirin and ibuprofen, or other minor pain relievers, such as acetaminophen, are often recommended.   Do  not take pain medication for 7 days before surgery.   Prescription pain relievers may be given if deemed necessary by your caregiver. Use only as directed and only as much as you need.   Corticosteroid injections  may be given by your caregiver. These injections should be reserved for the most serious cases, because they may only be given a certain number of times.  HEAT AND COLD  Cold treatment (icing) relieves pain and reduces inflammation. Cold treatment should be applied for 10 to 15 minutes every 2 to 3 hours for inflammation and pain and immediately after any activity that aggravates your symptoms. Use ice packs or massage the area with a piece of ice (ice massage).   Heat treatment may be used prior to performing the stretching and strengthening activities prescribed by your caregiver, physical therapist, or athletic trainer. Use a heat pack or soak the injury in warm water.  SEEK MEDICAL CARE IF:  Treatment seems to offer no benefit, or the condition worsens.   Any medications produce adverse side effects.     Posterior Tibial Tendon Rupture with Rehab Tendons are soft tissues that connect muscle to bone. Tendons allow muscles to move the skeletal system. A complete tear of the posterior tibial tendon is known as a posterior tendon rupture. The posterior tibial tendon attaches the posterior muscles on the inner portion of the back of the lower leg (tibialis muscles) to foot. The posterior tibialis muscles help straighten (plantarflex) and rotate the foot inward (medially rotate) the foot. A posterior tibial rupture will result in a decreased ability to perform these tasks. SYMPTOMS    A "pop" or tear felt and/ or heard in the area at the time of injury.   Pain, tenderness, inflammation, and/or bruising (contusion) around the medial inner side of the ankle.   Pain that worsens with dorsiflexion (opposite of plantarflexion) of the foot.   Decreased ankle strength.   Decrease in the prominence of the arch in the sole of the foot.  CAUSES   Tendon ruptures occur when a force is placed on the tendon that is greater than it can withstand. Common mechanisms of injury include:  An event that  places great stress on the tendon (jumping or starting a sprint)   Direct trauma to the ankle.  RISK INCREASES WITH:  Sports that involve forceful and explosive plantar flexion (jumping or quick starts).   Poor strength and flexibility.   Previous injury to the posterior tibial tendon.   Corticosteroid injection into the posterior tibial tendon.   Obesity.   Poor vascular circulation.  PREVENTION    Warm up and stretch properly before activity.   Allow for adequate recovery between workouts.   Maintain physical fitness:   Strength, flexibility, and endurance.   Cardiovascular fitness.   Maintain a healthy body weight.   Arch supports (orthotics), if you have flat feet.   Limit ankle movement by taping, wearing a brace, or using compression bandages.  PROGNOSIS   In order to have the highest likelihood of returning to your pre-injury activity level, surgery is usually recommended with 4 to 9 months of rehabilitation afterwards.   RELATED COMPLICATIONS    Decreased ability to plantarflex.   Re-rupture of the posterior tibial tendon.   Prolonged disability.   Flat feet.   Arthritis of the foot.   Risks of surgery: infection, bleeding, nerve damage, or damage to surrounding tissues.  TREATMENT   Treatment initially involves resting from any activities that aggravate one's  symptoms. Ice, medication, and elevation can be used to help reduce pain and inflammation. In order to have the best results, surgery is recommended. Surgery involves sewing (suturing) the ends of the torn tendon back together. If your surgeon cannot repair the tendon, then a replacement (reconstruction) surgery may be performed to use another tendon to replace the function of the ruptured tendon After surgery the ankle is immobilized, in order to allow the tendon to heal. After immobilization it is important to perform strengthening and stretching exercises to help regain strength and a full range of  motion. These exercises may be completed at home or with a therapist. MEDICATION    Nonsteroidal anti-inflammatory medications, such as aspirin and ibuprofen (do not take within 7 days before surgery), or other minor pain relievers, such as acetaminophen, are often recommended. Take these as directed by your caregiver. Contact your caregiver immediately if any bleeding, stomach upset, or signs of an allergic reaction occur.   Pain relievers may be prescribed as necessary by your caregiver. Use only as directed and only as much as you need.  SEEK MEDICAL CARE IF:  Treatment seems to offer no benefit, or the condition worsens.   Any medications produce adverse side effects.   Any complications from surgery occur:   Pain, numbness, or coldness in the extremity operated upon.   Discoloration of the nail beds (they become blue or gray) of the extremity operated upon.   Signs of infections (fever, pain, inflammation, redness, or persistent bleeding).

## 2012-04-03 ENCOUNTER — Telehealth: Payer: Self-pay | Admitting: *Deleted

## 2012-04-03 NOTE — Telephone Encounter (Signed)
Referral sent to [facility] 

## 2012-08-11 ENCOUNTER — Ambulatory Visit: Admitting: Orthopedic Surgery

## 2012-08-19 ENCOUNTER — Ambulatory Visit: Admitting: Orthopedic Surgery

## 2012-09-02 ENCOUNTER — Ambulatory Visit (INDEPENDENT_AMBULATORY_CARE_PROVIDER_SITE_OTHER)

## 2012-09-02 ENCOUNTER — Ambulatory Visit (INDEPENDENT_AMBULATORY_CARE_PROVIDER_SITE_OTHER): Admitting: Orthopedic Surgery

## 2012-09-02 VITALS — BP 126/90 | Ht 66.0 in | Wt 238.0 lb

## 2012-09-02 DIAGNOSIS — Z96659 Presence of unspecified artificial knee joint: Secondary | ICD-10-CM

## 2012-09-02 DIAGNOSIS — Z96652 Presence of left artificial knee joint: Secondary | ICD-10-CM

## 2012-09-02 NOTE — Patient Instructions (Signed)
activities as tolerated 

## 2012-09-02 NOTE — Progress Notes (Signed)
Patient ID: Stephanie Jarvis, female   DOB: 1950-10-25, 62 y.o.   MRN: [id number] Chief complaint total knee follow-up.  History this is a follow-up visit. Status post left total knee replacement.  DOS: 08-12-11  Implant DePuy  Review of systems patient has no complaints.  Exam Physical Exam(6) GENERAL: normal development   CDV: pulses are normal   Skin: normal  Psychiatric: awake, alert and oriented  Neuro: normal sensation  1 ambulation normal no devices 2 ROM = 110 3 Motor normal  4 Stability normal   Separate x-ray report.  Reason for x-ray, and we'll x-ray follow-up knee replacement.  3 views left knee.  The implant is aligned normally. There is no loosening.  Impression normal appearing knee replacement.    Assessment: Knee replacement functioning well    Plan: One year follow

## 2012-09-03 ENCOUNTER — Other Ambulatory Visit: Payer: Self-pay | Admitting: *Deleted

## 2012-09-03 ENCOUNTER — Telehealth: Payer: Self-pay | Admitting: *Deleted

## 2012-09-03 DIAGNOSIS — M76829 Posterior tibial tendinitis, unspecified leg: Secondary | ICD-10-CM

## 2012-09-03 NOTE — Telephone Encounter (Signed)
Faxed referral and office notes to Dr. Mckinney. Awaiting appointment. 

## 2012-09-07 NOTE — Telephone Encounter (Signed)
Appointment with Dr. Nolen Mu at [facility] 09/29/2012 @10 :45am. Patient aware.

## 2012-10-19 ENCOUNTER — Other Ambulatory Visit (HOSPITAL_COMMUNITY): Payer: Self-pay | Admitting: Internal Medicine

## 2012-10-19 DIAGNOSIS — Z139 Encounter for screening, unspecified: Secondary | ICD-10-CM

## 2012-10-19 DIAGNOSIS — C50912 Malignant neoplasm of unspecified site of left female breast: Secondary | ICD-10-CM

## 2012-11-18 ENCOUNTER — Ambulatory Visit (HOSPITAL_COMMUNITY)
Admission: RE | Admit: 2012-11-18 | Discharge: 2012-11-18 | Disposition: A | Discharge status: Home / Self Care | Source: Ambulatory Visit | Attending: Internal Medicine | Admitting: Internal Medicine

## 2012-11-18 DIAGNOSIS — Z853 Personal history of malignant neoplasm of breast: Secondary | ICD-10-CM | POA: Insufficient documentation

## 2012-11-18 DIAGNOSIS — C50912 Malignant neoplasm of unspecified site of left female breast: Secondary | ICD-10-CM

## 2013-09-02 ENCOUNTER — Ambulatory Visit: Admitting: Orthopedic Surgery

## 2013-10-14 ENCOUNTER — Ambulatory Visit: Admitting: Orthopedic Surgery

## 2013-10-18 ENCOUNTER — Ambulatory Visit (HOSPITAL_COMMUNITY)
Admission: RE | Admit: 2013-10-18 | Discharge: 2013-10-18 | Disposition: A | Discharge status: Home / Self Care | Source: Ambulatory Visit | Attending: Family Medicine | Admitting: Family Medicine

## 2013-10-18 ENCOUNTER — Other Ambulatory Visit (HOSPITAL_COMMUNITY): Payer: Self-pay | Admitting: Family Medicine

## 2013-10-18 DIAGNOSIS — M79673 Pain in unspecified foot: Secondary | ICD-10-CM

## 2013-10-18 DIAGNOSIS — M25579 Pain in unspecified ankle and joints of unspecified foot: Secondary | ICD-10-CM

## 2013-10-18 DIAGNOSIS — M79609 Pain in unspecified limb: Secondary | ICD-10-CM | POA: Insufficient documentation

## 2013-10-18 DIAGNOSIS — S92309A Fracture of unspecified metatarsal bone(s), unspecified foot, initial encounter for closed fracture: Secondary | ICD-10-CM | POA: Insufficient documentation

## 2013-10-18 DIAGNOSIS — X500XXA Overexertion from strenuous movement or load, initial encounter: Secondary | ICD-10-CM | POA: Insufficient documentation

## 2013-10-19 ENCOUNTER — Ambulatory Visit (INDEPENDENT_AMBULATORY_CARE_PROVIDER_SITE_OTHER): Admitting: Orthopedic Surgery

## 2013-10-19 VITALS — BP 118/67 | Ht 66.0 in | Wt 243.0 lb

## 2013-10-19 DIAGNOSIS — S92309A Fracture of unspecified metatarsal bone(s), unspecified foot, initial encounter for closed fracture: Secondary | ICD-10-CM

## 2013-10-19 NOTE — Patient Instructions (Signed)
Wear boot 

## 2013-10-19 NOTE — Progress Notes (Signed)
Patient ID: Stephanie Jarvis, female   DOB: 11-14-1950, 63 y.o.   MRN: [id number]  Chief Complaint  Patient presents with  . Foot Pain    Left foot fracture d/t injury 10/18/13. Referred by Dr. Ethlyn Gallery with [facility]    HISTORY: 62 years old fell yesterday injuring her left foot x-ray show oblique closed fifth metatarsal fracture left foot  Established patient new problem  She does complain of 9/10 pain which is worse when she's walking on that and improved when she is able to rest. She does use a cane for this. She has swelling and dull pain over the distal portion of the foot.  Exam shows a well-developed well-nourished female grooming and hygiene are intact vital signs are stable BP 118/67  Ht 5\' 6"  (1.676 m)  Wt 243 lb (110.224 kg)  BMI 39.24 kg/m2 Mood is normal. She is ambulatory with the cane and she's wearing flip-flops Tenderness minimal swelling minimal skin ecchymosis good pulse skin intact tenderness over the fracture ankle stable muscle tone in the foot normal. Balance normal  X-rays were done at an outside facility available for review by computer  Closed nondisplaced slightly rotated oblique fracture left fifth metatarsal distal portion  Recommend closed treatment in Cam Walker weightbearing as tolerated x-ray in 8 weeks

## 2013-10-25 ENCOUNTER — Other Ambulatory Visit (HOSPITAL_COMMUNITY): Payer: Self-pay | Admitting: Family Medicine

## 2013-10-25 DIAGNOSIS — C50919 Malignant neoplasm of unspecified site of unspecified female breast: Secondary | ICD-10-CM

## 2013-10-27 ENCOUNTER — Telehealth: Payer: Self-pay | Admitting: Orthopedic Surgery

## 2013-10-27 NOTE — Telephone Encounter (Signed)
Patient called to request medication for pain - states that Dr Aline Brochure had asked her if she had, or if she needed, prescription for pain at her initial office visit 10/19/13 for problem, left foot fracture.   She states she has finished the pain medicine (Hydrocodone) from her primary care physician, [facility], Dr. Ethlyn Gallery.  Please advise.  Patient ph# is [phone].

## 2013-10-28 ENCOUNTER — Other Ambulatory Visit: Payer: Self-pay | Admitting: Orthopedic Surgery

## 2013-10-28 MED ORDER — HYDROCODONE-ACETAMINOPHEN 5-325 MG PO TABS: 1.0000 | ORAL_TABLET | Freq: Four times a day (QID) | ORAL | Status: DC | PRN

## 2013-10-28 NOTE — Telephone Encounter (Signed)
Refilled and patient notified prescription was ready for pick up. 

## 2013-10-28 NOTE — Telephone Encounter (Signed)
Routing to Dr Harrison 

## 2013-10-29 NOTE — Telephone Encounter (Signed)
Patient picked up prescription 10/29/13.

## 2013-11-08 ENCOUNTER — Telehealth: Payer: Self-pay | Admitting: Orthopedic Surgery

## 2013-11-08 ENCOUNTER — Other Ambulatory Visit: Payer: Self-pay | Admitting: Orthopedic Surgery

## 2013-11-08 MED ORDER — HYDROCODONE-ACETAMINOPHEN 5-325 MG PO TABS: 1.0000 | ORAL_TABLET | Freq: Four times a day (QID) | ORAL | Status: DC | PRN

## 2013-11-08 NOTE — Telephone Encounter (Signed)
Routing to Dr Harrison 

## 2013-11-08 NOTE — Telephone Encounter (Signed)
Patient aware prescription is ready for pick up

## 2013-11-08 NOTE — Telephone Encounter (Signed)
Patient called to request medication refill, Hydrocodone 5-325.  Her next scheduled fracture care appointment is 12/14/13.  Her phone# is [phone].

## 2013-11-10 ENCOUNTER — Other Ambulatory Visit: Payer: Self-pay | Admitting: Orthopedic Surgery

## 2013-11-10 MED ORDER — HYDROCODONE-ACETAMINOPHEN 5-325 MG PO TABS: 1.0000 | ORAL_TABLET | Freq: Four times a day (QID) | ORAL | Status: DC | PRN

## 2013-11-10 NOTE — Telephone Encounter (Signed)
A prescription was generated on 11/10/13 for hydrocodone 5/325 mg,  however patient already received a prescription for this and picked it up on 11/09/13. So the prescription that was generated on 11/10/13 was shredded by me and witnessed by Laverle Patter, Rad Tech.

## 2013-11-24 ENCOUNTER — Ambulatory Visit (HOSPITAL_COMMUNITY)
Admission: RE | Admit: 2013-11-24 | Discharge: 2013-11-24 | Disposition: A | Discharge status: Home / Self Care | Source: Ambulatory Visit | Attending: Family Medicine | Admitting: Family Medicine

## 2013-11-24 ENCOUNTER — Other Ambulatory Visit (HOSPITAL_COMMUNITY): Payer: Self-pay | Admitting: Diagnostic Radiology

## 2013-11-24 ENCOUNTER — Encounter (HOSPITAL_COMMUNITY)

## 2013-11-24 DIAGNOSIS — C50919 Malignant neoplasm of unspecified site of unspecified female breast: Secondary | ICD-10-CM

## 2013-11-24 DIAGNOSIS — R921 Mammographic calcification found on diagnostic imaging of breast: Secondary | ICD-10-CM

## 2013-11-24 DIAGNOSIS — Z1231 Encounter for screening mammogram for malignant neoplasm of breast: Secondary | ICD-10-CM | POA: Insufficient documentation

## 2013-11-24 DIAGNOSIS — R928 Other abnormal and inconclusive findings on diagnostic imaging of breast: Secondary | ICD-10-CM | POA: Insufficient documentation

## 2013-11-25 ENCOUNTER — Other Ambulatory Visit: Payer: Self-pay | Admitting: Family Medicine

## 2013-11-25 DIAGNOSIS — R921 Mammographic calcification found on diagnostic imaging of breast: Secondary | ICD-10-CM

## 2013-12-03 ENCOUNTER — Inpatient Hospital Stay: Admission: RE | Admit: 2013-12-03 | Source: Ambulatory Visit

## 2013-12-08 ENCOUNTER — Ambulatory Visit
Admission: RE | Admit: 2013-12-08 | Discharge: 2013-12-08 | Disposition: A | Discharge status: Home / Self Care | Source: Ambulatory Visit | Attending: Family Medicine | Admitting: Family Medicine

## 2013-12-08 ENCOUNTER — Encounter (INDEPENDENT_AMBULATORY_CARE_PROVIDER_SITE_OTHER): Payer: Self-pay

## 2013-12-08 DIAGNOSIS — R921 Mammographic calcification found on diagnostic imaging of breast: Secondary | ICD-10-CM

## 2013-12-09 ENCOUNTER — Other Ambulatory Visit: Payer: Self-pay | Admitting: Family Medicine

## 2013-12-09 DIAGNOSIS — C50919 Malignant neoplasm of unspecified site of unspecified female breast: Secondary | ICD-10-CM

## 2013-12-14 ENCOUNTER — Encounter: Payer: Self-pay | Admitting: Orthopedic Surgery

## 2013-12-14 ENCOUNTER — Ambulatory Visit (INDEPENDENT_AMBULATORY_CARE_PROVIDER_SITE_OTHER): Payer: Self-pay | Admitting: Orthopedic Surgery

## 2013-12-14 ENCOUNTER — Ambulatory Visit (INDEPENDENT_AMBULATORY_CARE_PROVIDER_SITE_OTHER)

## 2013-12-14 VITALS — BP 119/69 | Ht 66.0 in | Wt 243.0 lb

## 2013-12-14 DIAGNOSIS — S92909A Unspecified fracture of unspecified foot, initial encounter for closed fracture: Secondary | ICD-10-CM

## 2013-12-14 DIAGNOSIS — S92309A Fracture of unspecified metatarsal bone(s), unspecified foot, initial encounter for closed fracture: Secondary | ICD-10-CM

## 2013-12-14 NOTE — Progress Notes (Signed)
Patient ID: Stephanie Jarvis, female   DOB: 09-14-1950, 63 y.o.   MRN: [id number]  Chief Complaint  Patient presents with  . Follow-up    8 week recheck left foot fracture DOI 10/18/13    BP 119/69  Ht 5\' 6"  (1.676 m)  Wt 243 lb (110.224 kg)  BMI 39.24 kg/m2   Fracture care followup left foot metatarsal fracture treated with short Cam Walker. Patient complains of no pain  X-rays today show fracture healed  Clinical exam shows no tenderness  Patient released

## 2013-12-14 NOTE — Patient Instructions (Signed)
Foot fracture: released

## 2013-12-21 ENCOUNTER — Ambulatory Visit
Admission: RE | Admit: 2013-12-21 | Discharge: 2013-12-21 | Disposition: A | Discharge status: Home / Self Care | Source: Ambulatory Visit | Attending: Family Medicine | Admitting: Family Medicine

## 2013-12-21 DIAGNOSIS — C50919 Malignant neoplasm of unspecified site of unspecified female breast: Secondary | ICD-10-CM

## 2013-12-28 ENCOUNTER — Ambulatory Visit
Admission: RE | Admit: 2013-12-28 | Discharge: 2013-12-28 | Disposition: A | Discharge status: Home / Self Care | Source: Ambulatory Visit | Attending: Family Medicine | Admitting: Family Medicine

## 2013-12-28 MED ORDER — GADOBENATE DIMEGLUMINE 529 MG/ML IV SOLN
20.0000 mL | Freq: Once | INTRAVENOUS | Status: AC | PRN
  Administered 2013-12-28: 20 mL via INTRAVENOUS

## 2014-01-05 ENCOUNTER — Other Ambulatory Visit

## 2014-01-14 ENCOUNTER — Encounter (HOSPITAL_COMMUNITY): Payer: Self-pay | Admitting: Pharmacy Technician

## 2014-01-19 NOTE — H&P (Signed)
  NTS SOAP Note  Vital Signs:  Vitals as of: 01/31/5052: Systolic 976: Diastolic 81: Heart Rate 78: Temp 97.66F: Height 38ft 6in: Weight 242Lbs 0 Ounces: BMI 39.06  BMI : 39.06 kg/m2  Subjective: This 63 Years 26 Months old Female presents for of left breast cancer.  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  Is s/p left partial mastectomy, sentinel lymph node biopsy, XRT in [place] 8 years ago.  No lump noted by patient.  Review of Symptoms:  Constitutional:unremarkable   Head:unremarkable    Eyes:unremarkable   Nose/Mouth/Throat:unremarkable Cardiovascular:  unremarkable   Respiratory:unremarkable   Gastrointestin    heartburn Genitourinary:unremarkable     Musculoskeletal:unremarkable   dry   as above Hematolgic/Lymphatic:unremarkable       seasonal   Past Medical History:    Reviewed  Past Medical History  Surgical History: BTL, bilateral knees, hiatal hernia repiar, left partial mastectomy with sentinel lymph node biopsy Medical Problems: depression, high chlesterol, HTN Allergies: nkda Medications: zolft, wellburtin, prilosec, lasix, crestor, metoprolol, allegra   Social History:Reviewed  Social History  Preferred Language: English Race:  White Ethnicity: Not Hispanic / Latino Age: 63 Years 8 Months Marital Status:  M Alcohol: yes   Smoking Status: Never smoker reviewed on 12/30/2013 Functional Status reviewed on 12/30/2013 ------------------------------------------------ Bathing: Normal Cooking: Normal Dressing: Normal Driving: Normal Eating: Normal Managing Meds: Normal Oral Care: Normal Shopping: Normal Toileting: Normal Transferring: Normal Walking: Normal Cognitive Status reviewed on 12/30/2013 ------------------------------------------------ Attention: Normal Decision Making: Normal Language: Normal Memory: Normal Motor: Normal Perception:  Normal Problem Solving: Normal Visual and Spatial: Normal   Family History:  Reviewed  Family Health History Mother, Deceased; Healthy;  Father, Deceased; Healthy;     Objective Information: General:  Well appearing, well nourished in no distress. Neck:  Supple without lymphadenopathy.  Heart:  RRR, no murmur or gallop.  Normal S1, S2.  No S3, S4.  Lungs:    CTA bilaterally, no wheezes, rhonchi, rales.  Breathing unlabored.     No dominant mass, nipple discharge, dimpling in either breast.  Superior surgical scar noted in left breast.  Assessment:Left breast cancer  Diagnoses: 174.9 Primary malignant neoplasm of female breast (Malignant neoplasm of unspecified site of left female breast)  Procedures: 73419 - OFFICE OUTPATIENT NEW 30 MINUTES    Plan:  As patient has already had left breast cancer treated with tamoxifen and XRT, she has a recurrence.  I recommend left modified radical mastectomy.  She understands and agrees.  All questions answered.  Will call to schedule surgery.   Patient Education:Alternative treatments to surgery were discussed with patient (and family).  Risks and benefits  of procedure including bleeding, infection, arm swelling, and the possibility of a blood transfusionwere fully explained to the patient (and family) who gave informed consent. Patient/family questions were addressed.  Follow-up:Pending Surgery

## 2014-01-19 NOTE — Patient Instructions (Addendum)
GLEE LASHOMB  01/19/2014   Your procedure is scheduled on:   01/26/2014  Report to [facility] at  51  AM.  Call this number if you have problems the morning of surgery: [phone]   Remember:   Do not eat food or drink liquids after midnight.   Take these medicines the morning of surgery with A SIP OF WATER: metoprolol,wellbutrin, robaxcin, prilosec, zoloft   Do not wear jewelry, make-up or nail polish.  Do not wear lotions, powders, or perfumes.   Do not shave 48 hours prior to surgery. Men may shave face and neck.  Do not bring valuables to the hospital.  [facility] is not responsible for any belongings or valuables.               Contacts, dentures or bridgework may not be worn into surgery.  Leave suitcase in the car. After surgery it may be brought to your room.  For patients admitted to the hospital, discharge time is determined by your treatment team.               Patients discharged the day of surgery will not be allowed to drive home.  Name and phone number of your driver: family  Special Instructions: Shower using CHG 2 nights before surgery and the night before surgery.  If you shower the day of surgery use CHG.  Use special wash - you have one bottle of CHG for all showers.  You should use approximately 1/3 of the bottle for each shower.   Please read over the following fact sheets that you were given: Pain Booklet, Coughing and Deep Breathing, Blood Transfusion Information, Surgical Site Infection Prevention, Anesthesia Post-op Instructions and Care and Recovery After Surgery Mastectomy, With or Without Reconstruction Mastectomy (removal of the breast) is a procedure most commonly used to treat cancer (tumor) of the breast. Different procedures are available for treatment. This depends on the stage of the tumor (abnormal growths). Discuss this with your caregiver, surgeon (a specialist for performing operations such as this), or oncologist (someone  specialized in the treatment of cancer). With proper information, you can decide which treatment is best for you. Although the sound of the word cancer is frightening to all of [place], the new treatments and medications can be a source of reassurance and comfort. If there are things you are worried about, discuss them with your caregiver. He or she can help comfort you and your family. Some of the different procedures for treating breast cancer are:  Radical (extensive) mastectomy. This is an operation used to remove the entire breast, the muscles under the breast, and all of the glands (lymph nodes) under the arm. With all of the new treatments available for cancer of the breast, this procedure has become less common.  Modified radical mastectomy. This is a similar operation to the radical mastectomy described above. In the modified radical mastectomy, the muscles of the chest wall are not removed unless one of the lessor muscles is removed. One of the lessor muscles may be removed to allow better removal of the lymph nodes. The axillary lymph nodes are also removed. Rarely, during an axillary node dissection nerves to this area are damaged. Radiation therapy is then often used to the area following this surgery.  A total mastectomy also known as a complete or simple mastectomy. It involves removal of only the breast. The lymph nodes and the muscles are left in place.  In a lumpectomy, the lump is removed from the breast. This is the simplest form of surgical treatment. A sentinel lymph node biopsy may also be done. Additional treatment may be required. RISKS AND COMPLICATIONS The main problems that follow removal of the breast include:  Infection (germs start growing in the wound). This can usually be treated with antibiotics (medications that kill germs).  Lymphedema. This means the arm on the side of the breast that was operated on swells because the lymph (tissue fluid) cannot follow the main channels  back into the body. This only occurs when the lymph nodes have had to be removed under the arm.  There may be some areas of numbness to the upper arm and around the incision (cut by the surgeon) in the breast. This happens because of the cutting of or damage to some of the nerves in the area. This is most often unavoidable.  There may be difficulty moving the arm in a full range of motion (moving in all directions) following surgery. This usually improves with time following use and exercise.  Recurrence of breast cancer may happen with the very best of surgery and follow up treatment. Sometimes small cancer cells that cannot be seen with the naked eye have already spread at the time of surgery. When this happens other treatment is available. This treatment may be radiation, medications or a combination of both. RECONSTRUCTION Reconstruction of the breast may be done immediately if there is not going to be post-operative radiation. This surgery is done for cosmetic (improve appearance) purposes to improve the physical appearance after the operation. This may be done in two ways:  It can be done using a saline filled prosthetic (an artificial breast which is filled with salt water). Silicone breast implants are now re-approved by the FDA and are being commonly used.  Reconstruction can be done using the body's own muscle/fat/skin. Your caregiver will discuss your options with you. Depending upon your needs or choice, together you will be able to determine which procedure is best for you. Document Released: 03/12/2001 Document Revised: 03/11/2012 Document Reviewed: 11/03/2007 [facility] Patient Information 2015 [place], [place]. This information is not intended to replace advice given to you by your health care provider. Make sure you discuss any questions you have with your health care provider. PATIENT INSTRUCTIONS POST-ANESTHESIA  IMMEDIATELY FOLLOWING SURGERY:  Do not drive or operate machinery for  the first twenty four hours after surgery.  Do not make any important decisions for twenty four hours after surgery or while taking narcotic pain medications or sedatives.  If you develop intractable nausea and vomiting or a severe headache please notify your doctor immediately.  FOLLOW-UP:  Please make an appointment with your surgeon as instructed. You do not need to follow up with anesthesia unless specifically instructed to do so.  WOUND CARE INSTRUCTIONS (if applicable):  Keep a dry clean dressing on the anesthesia/puncture wound site if there is drainage.  Once the wound has quit draining you may leave it open to air.  Generally you should leave the bandage intact for twenty four hours unless there is drainage.  If the epidural site drains for more than 36-48 hours please call the anesthesia department.  QUESTIONS?:  Please feel free to call your physician or the hospital operator if you have any questions, and they will be happy to assist you.

## 2014-01-20 ENCOUNTER — Ambulatory Visit (HOSPITAL_COMMUNITY)
Admission: RE | Admit: 2014-01-20 | Discharge: 2014-01-20 | Disposition: A | Discharge status: Home / Self Care | Source: Ambulatory Visit | Attending: General Surgery | Admitting: General Surgery

## 2014-01-20 ENCOUNTER — Encounter (HOSPITAL_COMMUNITY)
Admission: RE | Admit: 2014-01-20 | Discharge: 2014-01-20 | Disposition: A | Discharge status: Home / Self Care | Source: Ambulatory Visit | Attending: General Surgery | Admitting: General Surgery

## 2014-01-20 ENCOUNTER — Encounter (HOSPITAL_COMMUNITY): Payer: Self-pay

## 2014-01-20 ENCOUNTER — Other Ambulatory Visit: Payer: Self-pay

## 2014-01-20 DIAGNOSIS — I517 Cardiomegaly: Secondary | ICD-10-CM | POA: Insufficient documentation

## 2014-01-20 DIAGNOSIS — C50919 Malignant neoplasm of unspecified site of unspecified female breast: Secondary | ICD-10-CM | POA: Insufficient documentation

## 2014-01-20 HISTORY — DX: Sciatica, unspecified side: M54.30

## 2014-01-20 HISTORY — DX: Anxiety disorder, unspecified: F41.9

## 2014-01-20 LAB — COMPREHENSIVE METABOLIC PANEL
ALK PHOS: 64 U/L (ref 39–117)
ALT: 20 U/L (ref 0–35)
AST: 17 U/L (ref 0–37)
Albumin: 3.6 g/dL (ref 3.5–5.2)
Anion gap: 11 (ref 5–15)
BUN: 12 mg/dL (ref 6–23)
CALCIUM: 9.2 mg/dL (ref 8.4–10.5)
CO2: 27 mEq/L (ref 19–32)
Chloride: 101 mEq/L (ref 96–112)
Creatinine, Ser: 0.72 mg/dL (ref 0.50–1.10)
GFR calc Af Amer: >90 mL/min (ref >90)
GFR, EST NON AFRICAN AMERICAN: 89 mL/min — AB (ref >90)
Glucose, Bld: 102 mg/dL — ABNORMAL HIGH (ref 70–99)
Potassium: 4.2 mEq/L (ref 3.7–5.3)
SODIUM: 139 meq/L (ref 137–147)
Total Bilirubin: 0.4 mg/dL (ref 0.3–1.2)
Total Protein: 6.5 g/dL (ref 6.0–8.3)

## 2014-01-20 LAB — CBC WITH DIFFERENTIAL/PLATELET
BASOS ABS: 0.1 10*3/uL (ref 0.0–0.1)
Basophils Relative: 1 % (ref 0–1)
EOS PCT: 2 % (ref 0–5)
Eosinophils Absolute: 0.2 10*3/uL (ref 0.0–0.7)
HCT: 36.1 % (ref 36.0–46.0)
Hemoglobin: 12.7 g/dL (ref 12.0–15.0)
Lymphocytes Relative: 27 % (ref 12–46)
Lymphs Abs: 1.7 10*3/uL (ref 0.7–4.0)
MCH: 32.6 pg (ref 26.0–34.0)
MCHC: 35.2 g/dL (ref 30.0–36.0)
MCV: 92.8 fL (ref 78.0–100.0)
Monocytes Absolute: 0.7 10*3/uL (ref 0.1–1.0)
Monocytes Relative: 10 % (ref 3–12)
NEUTROS ABS: 3.9 10*3/uL (ref 1.7–7.7)
Neutrophils Relative %: 60 % (ref 43–77)
Platelets: 269 10*3/uL (ref 150–400)
RBC: 3.89 MIL/uL (ref 3.87–5.11)
RDW: 13.8 % (ref 11.5–15.5)
WBC: 6.5 10*3/uL (ref 4.0–10.5)

## 2014-01-20 LAB — TYPE AND SCREEN
ABO/RH(D): O POS
ANTIBODY SCREEN: NEGATIVE

## 2014-01-20 NOTE — Pre-Procedure Instructions (Signed)
Patient given information to sign up for my chart at home. 

## 2014-01-26 ENCOUNTER — Encounter (HOSPITAL_COMMUNITY)
Admission: RE | Disposition: A | Discharge status: Home / Self Care | Payer: Self-pay | Source: Ambulatory Visit | Attending: General Surgery

## 2014-01-26 ENCOUNTER — Encounter (HOSPITAL_COMMUNITY): Payer: Self-pay | Admitting: *Deleted

## 2014-01-26 ENCOUNTER — Observation Stay (HOSPITAL_COMMUNITY)
Admission: RE | Admit: 2014-01-26 | Discharge: 2014-01-27 | Disposition: A | Discharge status: Home / Self Care | Source: Ambulatory Visit | Attending: General Surgery | Admitting: General Surgery

## 2014-01-26 ENCOUNTER — Ambulatory Visit (HOSPITAL_COMMUNITY): Admitting: Anesthesiology

## 2014-01-26 ENCOUNTER — Encounter (HOSPITAL_COMMUNITY): Admitting: Anesthesiology

## 2014-01-26 DIAGNOSIS — F329 Major depressive disorder, single episode, unspecified: Secondary | ICD-10-CM | POA: Diagnosis not present

## 2014-01-26 DIAGNOSIS — Z79899 Other long term (current) drug therapy: Secondary | ICD-10-CM | POA: Diagnosis not present

## 2014-01-26 DIAGNOSIS — I10 Essential (primary) hypertension: Secondary | ICD-10-CM | POA: Insufficient documentation

## 2014-01-26 DIAGNOSIS — C50919 Malignant neoplasm of unspecified site of unspecified female breast: Principal | ICD-10-CM | POA: Diagnosis present

## 2014-01-26 DIAGNOSIS — F3289 Other specified depressive episodes: Secondary | ICD-10-CM | POA: Diagnosis not present

## 2014-01-26 DIAGNOSIS — Z9010 Acquired absence of unspecified breast and nipple: Secondary | ICD-10-CM | POA: Diagnosis not present

## 2014-01-26 HISTORY — PX: MASTECTOMY MODIFIED RADICAL: SHX5962

## 2014-01-26 SURGERY — MASTECTOMY, MODIFIED RADICAL
Anesthesia: General | Laterality: Left

## 2014-01-26 MED ORDER — LACTATED RINGERS IV SOLN
INTRAVENOUS | Status: DC
  Administered 2014-01-26 (×2): via INTRAVENOUS

## 2014-01-26 MED ORDER — DEXAMETHASONE SODIUM PHOSPHATE 4 MG/ML IJ SOLN
4.0000 mg | Freq: Once | INTRAMUSCULAR | Status: AC
  Administered 2014-01-26: 4 mg via INTRAVENOUS

## 2014-01-26 MED ORDER — CEFAZOLIN SODIUM-DEXTROSE 2-3 GM-% IV SOLR
INTRAVENOUS | Status: AC
  Filled 2014-01-26: qty 50

## 2014-01-26 MED ORDER — ONDANSETRON HCL 4 MG/2ML IJ SOLN
4.0000 mg | Freq: Once | INTRAMUSCULAR | Status: AC
  Administered 2014-01-26: 4 mg via INTRAVENOUS

## 2014-01-26 MED ORDER — ROCURONIUM BROMIDE 100 MG/10ML IV SOLN
INTRAVENOUS | Status: DC | PRN
  Administered 2014-01-26: 5 mg via INTRAVENOUS
  Administered 2014-01-26: 10 mg via INTRAVENOUS

## 2014-01-26 MED ORDER — SUCCINYLCHOLINE CHLORIDE 20 MG/ML IJ SOLN
INTRAMUSCULAR | Status: AC
  Filled 2014-01-26: qty 1

## 2014-01-26 MED ORDER — LIDOCAINE HCL (CARDIAC) 10 MG/ML IV SOLN
INTRAVENOUS | Status: DC | PRN
  Administered 2014-01-26: 10 mg via INTRAVENOUS

## 2014-01-26 MED ORDER — ONDANSETRON HCL 4 MG/2ML IJ SOLN
INTRAMUSCULAR | Status: AC
  Filled 2014-01-26: qty 2

## 2014-01-26 MED ORDER — SODIUM CHLORIDE 0.9 % IJ SOLN
INTRAMUSCULAR | Status: AC
  Filled 2014-01-26: qty 10

## 2014-01-26 MED ORDER — ONDANSETRON HCL 4 MG/2ML IJ SOLN: 4.0000 mg | Freq: Four times a day (QID) | INTRAMUSCULAR | Status: DC | PRN

## 2014-01-26 MED ORDER — EPHEDRINE SULFATE 50 MG/ML IJ SOLN
INTRAMUSCULAR | Status: DC | PRN
  Administered 2014-01-26 (×2): 5 mg via INTRAVENOUS

## 2014-01-26 MED ORDER — CEFAZOLIN SODIUM-DEXTROSE 2-3 GM-% IV SOLR
2.0000 g | INTRAVENOUS | Status: AC
  Administered 2014-01-26: 2 g via INTRAVENOUS

## 2014-01-26 MED ORDER — BUPROPION HCL ER (XL) 300 MG PO TB24
300.0000 mg | ORAL_TABLET | Freq: Every day | ORAL | Status: DC
  Filled 2014-01-26 (×2): qty 1

## 2014-01-26 MED ORDER — MIDAZOLAM HCL 2 MG/2ML IJ SOLN
INTRAMUSCULAR | Status: AC
  Filled 2014-01-26: qty 2

## 2014-01-26 MED ORDER — MIDAZOLAM HCL 2 MG/2ML IJ SOLN
INTRAMUSCULAR | Status: DC | PRN
  Administered 2014-01-26: 2 mg via INTRAVENOUS

## 2014-01-26 MED ORDER — HYDROMORPHONE HCL PF 1 MG/ML IJ SOLN
1.0000 mg | INTRAMUSCULAR | Status: DC | PRN
  Administered 2014-01-26 (×2): 1 mg via INTRAVENOUS
  Filled 2014-01-26 (×2): qty 1

## 2014-01-26 MED ORDER — METOPROLOL TARTRATE 25 MG PO TABS
12.5000 mg | ORAL_TABLET | Freq: Two times a day (BID) | ORAL | Status: DC
  Administered 2014-01-26 – 2014-01-27 (×2): 12.5 mg via ORAL
  Filled 2014-01-26 (×2): qty 1

## 2014-01-26 MED ORDER — CHLORHEXIDINE GLUCONATE 4 % EX LIQD: 1.0000 "application " | Freq: Once | CUTANEOUS | Status: DC

## 2014-01-26 MED ORDER — PROPOFOL 10 MG/ML IV BOLUS
INTRAVENOUS | Status: DC | PRN
  Administered 2014-01-26: 30 mg via INTRAVENOUS
  Administered 2014-01-26: 20 mg via INTRAVENOUS
  Administered 2014-01-26: 150 mg via INTRAVENOUS

## 2014-01-26 MED ORDER — MIDAZOLAM HCL 2 MG/2ML IJ SOLN
1.0000 mg | INTRAMUSCULAR | Status: DC | PRN
Start: 2014-01-26 — End: 2014-01-26
  Administered 2014-01-26: 2 mg via INTRAVENOUS

## 2014-01-26 MED ORDER — ONDANSETRON HCL 4 MG/2ML IJ SOLN
4.0000 mg | Freq: Once | INTRAMUSCULAR | Status: AC | PRN
  Administered 2014-01-26: 4 mg via INTRAVENOUS

## 2014-01-26 MED ORDER — LIDOCAINE HCL (PF) 1 % IJ SOLN
INTRAMUSCULAR | Status: AC
  Filled 2014-01-26: qty 5

## 2014-01-26 MED ORDER — ACETAMINOPHEN 500 MG PO TABS
1000.0000 mg | ORAL_TABLET | Freq: Four times a day (QID) | ORAL | Status: AC
  Administered 2014-01-26 – 2014-01-27 (×4): 1000 mg via ORAL
  Filled 2014-01-26 (×4): qty 2

## 2014-01-26 MED ORDER — ENOXAPARIN SODIUM 40 MG/0.4ML ~~LOC~~ SOLN: 40.0000 mg | SUBCUTANEOUS | Status: DC

## 2014-01-26 MED ORDER — FENTANYL CITRATE 0.05 MG/ML IJ SOLN
INTRAMUSCULAR | Status: DC | PRN
  Administered 2014-01-26: 50 ug via INTRAVENOUS
  Administered 2014-01-26 (×3): 25 ug via INTRAVENOUS
  Administered 2014-01-26: 50 ug via INTRAVENOUS
  Administered 2014-01-26: 25 ug via INTRAVENOUS
  Administered 2014-01-26: 50 ug via INTRAVENOUS

## 2014-01-26 MED ORDER — FENTANYL CITRATE 0.05 MG/ML IJ SOLN
INTRAMUSCULAR | Status: AC
  Filled 2014-01-26: qty 2

## 2014-01-26 MED ORDER — EPHEDRINE SULFATE 50 MG/ML IJ SOLN
INTRAMUSCULAR | Status: AC
  Filled 2014-01-26: qty 1

## 2014-01-26 MED ORDER — NEOSTIGMINE METHYLSULFATE 10 MG/10ML IV SOLN
INTRAVENOUS | Status: DC | PRN
  Administered 2014-01-26: 2 mg via INTRAVENOUS

## 2014-01-26 MED ORDER — SUCCINYLCHOLINE CHLORIDE 20 MG/ML IJ SOLN
INTRAMUSCULAR | Status: DC | PRN
  Administered 2014-01-26: 140 mg via INTRAVENOUS

## 2014-01-26 MED ORDER — KETOROLAC TROMETHAMINE 30 MG/ML IJ SOLN
30.0000 mg | Freq: Once | INTRAMUSCULAR | Status: AC
  Administered 2014-01-26: 30 mg via INTRAVENOUS

## 2014-01-26 MED ORDER — POVIDONE-IODINE 10 % EX OINT
TOPICAL_OINTMENT | CUTANEOUS | Status: AC
  Filled 2014-01-26: qty 2

## 2014-01-26 MED ORDER — DEXAMETHASONE SODIUM PHOSPHATE 4 MG/ML IJ SOLN
INTRAMUSCULAR | Status: AC
  Filled 2014-01-26: qty 1

## 2014-01-26 MED ORDER — FENTANYL CITRATE 0.05 MG/ML IJ SOLN
INTRAMUSCULAR | Status: AC
  Filled 2014-01-26: qty 5

## 2014-01-26 MED ORDER — SERTRALINE HCL 50 MG PO TABS
100.0000 mg | ORAL_TABLET | Freq: Every day | ORAL | Status: DC
Start: 2014-01-26 — End: 2014-01-27
  Administered 2014-01-26: 100 mg via ORAL
  Filled 2014-01-26 (×2): qty 1
  Filled 2014-01-26: qty 2

## 2014-01-26 MED ORDER — GLYCOPYRROLATE 0.2 MG/ML IJ SOLN
INTRAMUSCULAR | Status: DC | PRN
  Administered 2014-01-26: 0.4 mg via INTRAVENOUS

## 2014-01-26 MED ORDER — ONDANSETRON HCL 4 MG PO TABS: 4.0000 mg | ORAL_TABLET | Freq: Four times a day (QID) | ORAL | Status: DC | PRN

## 2014-01-26 MED ORDER — FUROSEMIDE 40 MG PO TABS
40.0000 mg | ORAL_TABLET | Freq: Every day | ORAL | Status: DC
  Administered 2014-01-27: 40 mg via ORAL
  Filled 2014-01-26: qty 1

## 2014-01-26 MED ORDER — PROPOFOL 10 MG/ML IV EMUL
INTRAVENOUS | Status: AC
  Filled 2014-01-26: qty 20

## 2014-01-26 MED ORDER — GLYCOPYRROLATE 0.2 MG/ML IJ SOLN
INTRAMUSCULAR | Status: AC
  Filled 2014-01-26: qty 2

## 2014-01-26 MED ORDER — KETOROLAC TROMETHAMINE 30 MG/ML IJ SOLN
INTRAMUSCULAR | Status: AC
  Filled 2014-01-26: qty 1

## 2014-01-26 MED ORDER — FENTANYL CITRATE 0.05 MG/ML IJ SOLN
25.0000 ug | INTRAMUSCULAR | Status: DC | PRN
  Administered 2014-01-26 (×2): 50 ug via INTRAVENOUS

## 2014-01-26 MED ORDER — LACTATED RINGERS IV SOLN
INTRAVENOUS | Status: DC
Start: 2014-01-26 — End: 2014-01-27
  Administered 2014-01-26: 20:00:00 via INTRAVENOUS

## 2014-01-26 MED ORDER — ROCURONIUM BROMIDE 50 MG/5ML IV SOLN
INTRAVENOUS | Status: AC
  Filled 2014-01-26: qty 1

## 2014-01-26 MED ORDER — 0.9 % SODIUM CHLORIDE (POUR BTL) OPTIME
TOPICAL | Status: DC | PRN
  Administered 2014-01-26: 1000 mL

## 2014-01-26 MED ORDER — NEOSTIGMINE METHYLSULFATE 10 MG/10ML IV SOLN
INTRAVENOUS | Status: AC
  Filled 2014-01-26: qty 1

## 2014-01-26 MED ORDER — POVIDONE-IODINE 10 % OINT PACKET
TOPICAL_OINTMENT | CUTANEOUS | Status: DC | PRN
  Administered 2014-01-26: 1 via TOPICAL

## 2014-01-26 SURGICAL SUPPLY — 40 items
APPLIER CLIP 9.375 SM OPEN (CLIP) ×3
BAG HAMPER (MISCELLANEOUS) ×3 IMPLANT
CLIP APPLIE 9.375 SM OPEN (CLIP) ×1 IMPLANT
CLOTH BEACON ORANGE TIMEOUT ST (SAFETY) ×3 IMPLANT
COVER LIGHT HANDLE STERIS (MISCELLANEOUS) ×6 IMPLANT
DRAPE PROXIMA HALF (DRAPES) ×3 IMPLANT
DRAPE UTILITY W/TAPE 26X15 (DRAPES) ×3 IMPLANT
DURAPREP 26ML APPLICATOR (WOUND CARE) ×3 IMPLANT
ELECT REM PT RETURN 9FT ADLT (ELECTROSURGICAL) ×3
ELECTRODE REM PT RTRN 9FT ADLT (ELECTROSURGICAL) ×1 IMPLANT
EVACUATOR DRAINAGE 10X20 100CC (DRAIN) ×1 IMPLANT
EVACUATOR SILICONE 100CC (DRAIN) ×2
GAUZE SPONGE 4X4 12PLY STRL (GAUZE/BANDAGES/DRESSINGS) ×3 IMPLANT
GLOVE BIOGEL PI IND STRL 7.0 (GLOVE) ×3 IMPLANT
GLOVE BIOGEL PI INDICATOR 7.0 (GLOVE) ×6
GLOVE ECLIPSE 6.5 STRL STRAW (GLOVE) ×3 IMPLANT
GLOVE SS BIOGEL STRL SZ 6.5 (GLOVE) ×2 IMPLANT
GLOVE SUPERSENSE BIOGEL SZ 6.5 (GLOVE) ×4
GLOVE SURG SS PI 7.5 STRL IVOR (GLOVE) ×3 IMPLANT
GOWN STRL REUS W/TWL LRG LVL3 (GOWN DISPOSABLE) ×9 IMPLANT
INST SET MINOR GENERAL (KITS) ×3 IMPLANT
KIT ROOM TURNOVER APOR (KITS) ×3 IMPLANT
MANIFOLD NEPTUNE II (INSTRUMENTS) ×3 IMPLANT
NS IRRIG 1000ML POUR BTL (IV SOLUTION) ×3 IMPLANT
PACK MINOR (CUSTOM PROCEDURE TRAY) ×3 IMPLANT
PAD ARMBOARD 7.5X6 YLW CONV (MISCELLANEOUS) ×3 IMPLANT
SET BASIN LINEN APH (SET/KITS/TRAYS/PACK) ×3 IMPLANT
SPONGE DRAIN TRACH 4X4 STRL 2S (GAUZE/BANDAGES/DRESSINGS) ×3 IMPLANT
SPONGE GAUZE 4X4 12PLY (GAUZE/BANDAGES/DRESSINGS) ×2 IMPLANT
SPONGE LAP 18X18 X RAY DECT (DISPOSABLE) ×9 IMPLANT
STAPLER VISISTAT (STAPLE) ×6 IMPLANT
SUT ETHILON 3 0 FSL (SUTURE) ×3 IMPLANT
SUT SILK 2 0 (SUTURE) ×2
SUT SILK 2 0 SH (SUTURE) ×3 IMPLANT
SUT SILK 2-0 18XBRD TIE 12 (SUTURE) ×1 IMPLANT
SUT VIC AB 2-0 CT1 27 (SUTURE) ×12
SUT VIC AB 2-0 CT1 TAPERPNT 27 (SUTURE) ×6 IMPLANT
SUT VIC AB 3-0 SH 27 (SUTURE) ×2
SUT VIC AB 3-0 SH 27X BRD (SUTURE) ×1 IMPLANT
TAPE CLOTH SURG 4X10 WHT LF (GAUZE/BANDAGES/DRESSINGS) ×3 IMPLANT

## 2014-01-26 NOTE — Op Note (Signed)
Patient:  Stephanie Jarvis  DOB:  March 09, 1951  MRN:  [id number]   Preop Diagnosis:  Recurrent left breast carcinoma  Postop Diagnosis:  Same  Procedure:  Left modified radical mastectomy  Surgeon:  Aviva Signs, M.D.  Anes:  General endotracheal  Indications:  Patient is a 63 year old white female status post left partial mastectomy and sentinel lymph node biopsy 8 years ago in [place] who now presents with a recurrence in the same breast. The risks and benefits of the procedure including bleeding, infection, nerve injury, the possibility of a blood transfusion were fully explained to the patient, who gave informed consent.  Procedure note:  The patient is placed the supine position. After induction of general endotracheal anesthesia, the left breast and axilla were prepped and draped using usual sterile technique with DuraPrep. Surgical site confirmation was performed.  An elliptical incision was made medial to lateral around the left nipple. These did include the previous surgical scar. A superior flap was then formed to the clavicle and an inferior flap formed to the chest wall. The breast was then removed from the pectoralis major muscle medial to lateral using Bovie electrocautery. A short suture was placed superiorly and a long suture placed laterally for orientation purposes. Specimen sent to pathology further examination. A level II axillary dissection was then performed. Mostly fatty tissue was found. Care was taken to avoid the long thoracic nerve and thoracodorsal artery vein and nerves. The axillary contents were then sent to pathology further examination. The wound is crusted gave normal saline. Any bleeding in the left axilla was controlled using small clips. A #10 flat Jackson-Pratt drain was placed under the flap and brought through separate stab wound inferior to the incision line. It was secured at the skin the using 3-0 nylon interrupted suture. The subcutaneous layer was  reapproximated using 2-0 Vicryl interrupted sutures. The skin was closed using staples. Betadine ointment and dressed a dressings were applied.  All tape and needle counts were correct at the end of the procedure. Patient was extubated in the operating room and transferred to PACU in stable condition.  Complications:  None  EBL:  50 cc  Specimen:  Left breast, short suture superior and long suture lateral, left axillary contents  Drains: JP drain to left mastectomy flap

## 2014-01-26 NOTE — Anesthesia Preprocedure Evaluation (Signed)
Anesthesia Evaluation  Patient identified by MRN, date of birth, ID band Patient awake    Reviewed: Allergy & Precautions, H&P , NPO status , reviewed documented beta blocker date and time   History of Anesthesia Complications Negative for: history of anesthetic complications (aspiration after  emergence from total kness surgery)  Airway Mallampati: III TM Distance: <3 FB   Mouth opening: Limited Mouth Opening  Dental  (+) Teeth Intact, Dental Advisory Given   Pulmonary neg pulmonary ROS, former smoker,  breath sounds clear to auscultation        Cardiovascular + dysrhythmias (cathed 04/2011 = negcontrolled with beta blocker) Atrial Fibrillation Rhythm:Regular Rate:Normal     Neuro/Psych PSYCHIATRIC DISORDERS Anxiety  Neuromuscular disease    GI/Hepatic hiatal hernia (corrected by Nissen Fundoplication), GERD-  Medicated and Controlled,(+) Hepatitis - (1968), A  Endo/Other    Renal/GU      Musculoskeletal  (+) Arthritis - (L knee),   Abdominal   Peds  Hematology   Anesthesia Other Findings   Reproductive/Obstetrics                           Anesthesia Physical Anesthesia Plan  ASA: III  Anesthesia Plan: General   Post-op Pain Management:    Induction: Intravenous, Rapid sequence and Cricoid pressure planned  Airway Management Planned: Oral ETT and Video Laryngoscope Planned  Additional Equipment:   Intra-op Plan:   Post-operative Plan: Extubation in OR  Informed Consent: I have reviewed the patients History and Physical, chart, labs and discussed the procedure including the risks, benefits and alternatives for the proposed anesthesia with the patient or authorized representative who has indicated his/her understanding and acceptance.     Plan Discussed with:   Anesthesia Plan Comments:         Anesthesia Quick Evaluation

## 2014-01-26 NOTE — Anesthesia Procedure Notes (Signed)
Procedure Name: Intubation Date/Time: 01/26/2014 7:55 AM Performed by: Vista Deck Pre-anesthesia Checklist: Patient identified, Patient being monitored, Timeout performed, Emergency Drugs available and Suction available Patient Re-evaluated:Patient Re-evaluated prior to inductionOxygen Delivery Method: Circle System Utilized Preoxygenation: Pre-oxygenation with 100% oxygen Intubation Type: IV induction, Rapid sequence and Cricoid Pressure applied Ventilation: Mask ventilation without difficulty and Oral airway inserted - appropriate to patient size Grade View: Grade I Tube type: Oral Tube size: 7.0 mm Number of attempts: 1 Airway Equipment and Method: stylet,  Video-laryngoscopy and Stylet Placement Confirmation: ETT inserted through vocal cords under direct vision,  positive ETCO2 and breath sounds checked- equal and bilateral Secured at: 22 cm Tube secured with: Tape Dental Injury: Teeth and Oropharynx as per pre-operative assessment  Comments: 3 Glidescope cover

## 2014-01-26 NOTE — Anesthesia Postprocedure Evaluation (Signed)
  Anesthesia Post-op Note  Patient: Stephanie Jarvis  Procedure(s) Performed: Procedure(s): LEFT MODIFIED RADICAL MASTECTOMY (Left)  Patient Location: PACU  Anesthesia Type:General  Level of Consciousness: awake and patient cooperative  Airway and Oxygen Therapy: Patient Spontanous Breathing  Post-op Pain: mild  Post-op Assessment: Post-op Vital signs reviewed, Patient's Cardiovascular Status Stable, Respiratory Function Stable and Patent Airway  Post-op Vital Signs: Reviewed and stable  Last Vitals:  Filed Vitals:   01/26/14 1503  BP: 113/55  Pulse: 85  Temp: 36.6 C  Resp: 17    Complications: No apparent anesthesia complications. Late entry T.Kristol Almanzar CRNA 01/26/2014 1610

## 2014-01-26 NOTE — Progress Notes (Signed)
Talked with family per pt request. Pt update given. Voiced understanding. Sprite given to drink. Tolerated well.

## 2014-01-26 NOTE — Interval H&P Note (Signed)
History and Physical Interval Note:  01/26/2014 7:24 AM  Stephanie Jarvis  has presented today for surgery, with the diagnosis of left breast cancer  The various methods of treatment have been discussed with the patient and family. After consideration of risks, benefits and other options for treatment, the patient has consented to  Procedure(s): MASTECTOMY MODIFIED RADICAL (Left) as a surgical intervention .  The patient's history has been reviewed, patient examined, no change in status, stable for surgery.  I have reviewed the patient's chart and labs.  Questions were answered to the patient's satisfaction.     Aviva Signs A

## 2014-01-26 NOTE — Transfer of Care (Signed)
Immediate Anesthesia Transfer of Care Note  Patient: Stephanie Jarvis  Procedure(s) Performed: Procedure(s): LEFT MODIFIED RADICAL MASTECTOMY (Left)  Patient Location: PACU  Anesthesia Type:General  Level of Consciousness: sedated and patient cooperative  Airway & Oxygen Therapy: Patient Spontanous Breathing and Patient connected to nasal cannula oxygen  Post-op Assessment: Report given to PACU RN, Post -op Vital signs reviewed and stable and Patient moving all extremities  Post vital signs: Reviewed and stable  Complications: No apparent anesthesia complications

## 2014-01-27 DIAGNOSIS — C50919 Malignant neoplasm of unspecified site of unspecified female breast: Secondary | ICD-10-CM | POA: Diagnosis not present

## 2014-01-27 LAB — CBC
HEMATOCRIT: 34 % — AB (ref 36.0–46.0)
Hemoglobin: 11.3 g/dL — ABNORMAL LOW (ref 12.0–15.0)
MCH: 31.7 pg (ref 26.0–34.0)
MCHC: 33.2 g/dL (ref 30.0–36.0)
MCV: 95.2 fL (ref 78.0–100.0)
Platelets: 249 10*3/uL (ref 150–400)
RBC: 3.57 MIL/uL — ABNORMAL LOW (ref 3.87–5.11)
RDW: 14 % (ref 11.5–15.5)
WBC: 9 10*3/uL (ref 4.0–10.5)

## 2014-01-27 LAB — BASIC METABOLIC PANEL
Anion gap: 9 (ref 5–15)
BUN: 13 mg/dL (ref 6–23)
CALCIUM: 9.1 mg/dL (ref 8.4–10.5)
CHLORIDE: 99 meq/L (ref 96–112)
CO2: 29 mEq/L (ref 19–32)
CREATININE: 0.73 mg/dL (ref 0.50–1.10)
GFR calc non Af Amer: 88 mL/min — ABNORMAL LOW (ref >90)
Glucose, Bld: 101 mg/dL — ABNORMAL HIGH (ref 70–99)
Potassium: 4.1 mEq/L (ref 3.7–5.3)
Sodium: 137 mEq/L (ref 137–147)

## 2014-01-27 MED ORDER — PANTOPRAZOLE SODIUM 40 MG PO TBEC
40.0000 mg | DELAYED_RELEASE_TABLET | Freq: Every day | ORAL | Status: DC
  Administered 2014-01-27: 40 mg via ORAL
  Filled 2014-01-27 (×2): qty 1

## 2014-01-27 MED ORDER — HYDROCODONE-ACETAMINOPHEN 5-325 MG PO TABS: 1.0000 | ORAL_TABLET | Freq: Four times a day (QID) | ORAL | Status: DC | PRN

## 2014-01-27 NOTE — Progress Notes (Signed)
Upon assessment, patient stated she had not voided since 6:30am yesterday (01/26/2014). Patient was assisted to bathroom to try to void using toilet, but was unable to go. Bladder scan was done and revealed over 515mL residual in bladder. Dr. Arnoldo Morale made aware. He ordered in-and-out catheterization, every six hours as needed. I&O cath was performed and 759mL of urine removed from bladder. Patient tolerated procedure well. Will continue to monitor for retention.

## 2014-01-27 NOTE — Care Management Note (Signed)
    Page 1 of 1   01/27/2014     10:32:23 AM CARE MANAGEMENT NOTE 01/27/2014  Patient:  Stephanie Jarvis, Stephanie Jarvis   Account Number:  [id number]  Date Initiated:  01/27/2014  Documentation initiated by:  Jolene Provost  Subjective/Objective Assessment:   Patient admitted OBS post radical mastectomy. Patient from Home and will return home at discharge. Patient to recieve [facility] RN through [facility] (per patients request) at D/C. Romualdo Bolk of [facility] is aware and will collect pt info from chart.     Action/Plan:   HH services to start within 48 hrs of D/C. No DME needs noted. Patient and Pt's nurse aware of arrangments.   Anticipated DC Date:  01/27/2014   Anticipated DC Plan:  [place]  CM consult      [facility] Choice  HOME HEALTH   Choice offered to / List presented to:  C-1 Patient        [place] arranged  HH-1 RN      [place].   Status of service:  Completed, signed off Medicare Important Message given?   (If response is "NO", the following Medicare IM given date fields will be blank) Date Medicare IM given:   Medicare IM given by:   Date Additional Medicare IM given:   Additional Medicare IM given by:    Discharge Disposition:  [place]  Per UR Regulation:    If discussed at Long Length of Stay Meetings, dates discussed:    Comments:  01/27/2014 [place], RN, BSN, [facility]

## 2014-01-27 NOTE — Discharge Summary (Signed)
Physician Discharge Summary  Patient ID: Stephanie Jarvis MRN: [id number] DOB/AGE: 1950-08-14 63 y.o.  Admit date: 01/26/2014 Discharge date: 01/27/2014  Admission Diagnoses: Recurrent left breast cancer  Discharge Diagnoses: Same Active Problems:   Breast cancer   Discharged Condition: good  Hospital Course: Patient is a 63 year old white female with a history of left breast cancer who presents with recurrent left breast cancer. General a left modified radical mastectomy on 01/26/2014 patient tolerated procedure well. Her postoperative course has been unremarkable for mild urinary retention. Her hemoglobin is within normal limits.  Final pathology is pending. She is being discharged home with home health consultation in good improving condition. She will need to drain her bulb suction 2- 3 times a day.  Treatments: surgery: Left modified radical mastectomy on 01/26/2014  Discharge Exam: Blood pressure 148/58, pulse 71, temperature 97.8 F (36.6 C), temperature source Oral, resp. rate 18, height 5\' 6"  (1.676 m), weight 108.863 kg (240 lb), SpO2 99.00%. General appearance: alert, cooperative and no distress Resp: clear to auscultation bilaterally Breasts: Left mastectomy dressing dry and intact. JP drainage serosanguineous in nature. Cardio: regular rate and rhythm, S1, S2 normal, no murmur, click, rub or gallop  Disposition: 06-Home-Health Care Svc     Medication List         buPROPion 300 MG 24 hr tablet  Commonly known as:  WELLBUTRIN XL  Take 300 mg by mouth daily.     fexofenadine 180 MG tablet  Commonly known as:  ALLEGRA  Take 180 mg by mouth daily.     furosemide 40 MG tablet  Commonly known as:  LASIX  Take 40 mg by mouth daily.     HYDROcodone-acetaminophen 5-325 MG per tablet  Commonly known as:  NORCO/VICODIN  Take 1-2 tablets by mouth every 6 (six) hours as needed for moderate pain.     LORazepam 0.5 MG tablet  Commonly known as:  ATIVAN  Take 0.5 mg by  mouth at bedtime.     methocarbamol 500 MG tablet  Commonly known as:  ROBAXIN  Take 500 mg by mouth at bedtime as needed for muscle spasms.     metoprolol tartrate 25 MG tablet  Commonly known as:  LOPRESSOR  Take 12.5 mg by mouth 2 (two) times daily. Half a tablet twice daily     MULTIVITAMIN PO  Take 1 tablet by mouth daily.     omeprazole 20 MG capsule  Commonly known as:  PRILOSEC  Take 20 mg by mouth 2 (two) times daily.     rosuvastatin 10 MG tablet  Commonly known as:  CRESTOR  Take 10 mg by mouth daily.     sertraline 100 MG tablet  Commonly known as:  ZOLOFT  Take 100 mg by mouth at bedtime.           Follow-up Information   Follow up with Jamesetta So, MD. Schedule an appointment as soon as possible for a visit on 02/08/2014.   Specialty:  General Surgery   Contact information:   [address] [address] [phone]       Signed: Aviva Signs A 01/27/2014, 7:58 AM

## 2014-01-27 NOTE — Discharge Instructions (Signed)
Total or Modified Radical Mastectomy, Care After °Refer to this sheet in the next few weeks. These instructions provide you with information on caring for yourself after your procedure. Your caregiver may also give you more specific instructions. Your treatment has been planned according to current medical practices, but problems sometimes occur. Call your caregiver if you have any problems or questions after your procedure. °ACTIVITY °· Your caregiver will advise you when you may resume strenuous activities, driving, and sports. °· After the drain(s) are removed, you may do light housework. Avoid heavy lifting, carrying, or pushing. You should not be lifting anything heavier than 5 lbs. °· Take frequent rest periods. You may tire more easily than usual. °· Always rest and elevate the arm affected by your surgery for a period of time equal to your activity time. °· Continue doing the exercises given to you by the physical therapist/occupational therapist even after full range of motion has returned. The amount of time this takes will vary from person to person. °· After normal range of motion has returned, some stiffness and soreness may persist for 2-3 months. This is normal and will subside. °· Begin sports or strenuous activities in moderation. This will give you a chance to rebuild your endurance. Continue to be cautious of heavy lifting or carrying (no more than 10 lbs.) with your affected arm. °· You may return to work as recommended by your caregiver. °NUTRITION °· You may resume your normal diet. °· Make sure you drink plenty of fluids (6-8 glasses a day). °· Eat a well-balanced diet. Including daily portions of food from government recommended food groups: °¨ Grains. °¨ Vegetables. °¨ Fruits. °¨ Milk. °¨ Meat & beans. °¨ Oils. °Visit http://mypyramid.gov/ for more information °HYGIENE °· You may wash your hair. °· If your incision (cut from surgery) is closed, you may shower or tub bathe, unless instructed  otherwise by your doctor. °FEVER °· If you feel feverish or have shaking chills, take your temperature. If your temperature is 102° F (38.9° C) or above, call your caregiver. The fever may mean there is an infection. °· If you call early, infection can be treated with antibiotics and hospitalization may be avoided. °PAIN CONTROL °· Mild discomfort may occur. °· You may need to take an over-the-counter pain medication or a medication prescribed by your caregiver. °· Call your caregiver if you experience increased pain. °INCISION CARE °· Check your incision daily for increased redness, drainage, swelling, or separation of skin. °· Call your caregiver if any of the above are noted. °ARM AND HAND CARE °· If the lymph nodes under your arm were removed with a modified radical mastectomy, there may be a greater tendency for the arm to swell. °· Try to avoid having blood pressures taken, blood drawn, or injections given in the affected arm. This is the arm on the same side as the surgery. °· Use hand lotion to soften cuticles instead of cutting them to avoid cutting yourself. °· Be careful when shaving your under arms. Use an electric shaver if possible. You may use a deodorant after the incision has completely healed. Until then, clean under your arms with hydrogen peroxide. °· Use reasonable precaution when cooking, sewing, and gardening to avoid burning or needle or thorn pricks. °· Do not weigh your arm straight down with a package or your purse. °· Follow the exercises and instructions given to you by the physical therapist/occupational therapist and your caregiver. °FOLLOW-UP APPOINTMENT °Call your caregiver for a follow-up   appointment as directed. °PROSTHESIS INFORMATION °Wear your temporary prosthesis (artificial breast) until your caregiver gives you permission to purchase a permanent one. This will depend upon your rate of healing. We suggest you also wait until you are physically and emotionally ready to shop for  one. The suitability depends on several individual factors. We do not endorse any particular prosthesis, but suggest you try several until you are satisfied with appearance and fit. A list of stores may be obtained from your local [employer] at www.cancer.org or 1-800-ACS-2345 (1-800-227-2345). °A permanent prosthesis is medically necessary to restore balance. It is also income tax deductible. Be sure all receipts are marked "surgical". It is not essential to purchase a bra. You may sew a pocket into your regular bra. °Note: Remember to take all of your medical insurance information with you when shopping for your prosthesis. °SELECTING A PROSTHESIS FITTER °You may want to ask the following questions when selecting a fitter: °· What styles and brands of forms are carried in stock? °· How long have the forms been on the market and have there been any problems with them? °· Why would one form be better than another? °· How long should a particular form last? °· May I wear the form for a trial period without obligation? °· Do the forms require a prosthetic bra? If so, what is the price range? Must I always wear that style? °· If alterations to the bra are necessary, can they be done at this location or be sent out? °· Will I be charged for alterations? °· Will I receive suggestions on how to alter my own wardrobe, if necessary? °· Will you special order forms or bras if necessary? °· Are fitters always available to meet my needs? °· What kinds of garments should be worn for the fitting? °· Are lounge wear, swim wear, and accessories available? °· If I have insurance coverage or Medicare, will you suggest ways for processing the paper work? °· Do you keep complete records so that mail reordering is possible? °· How are warranty claims handled if I have a problem with the form? °Document Released: 02/08/2004 Document Revised: 06/22/2013 Document Reviewed: 10/13/2007 °ExitCare® Patient Information ©2015  [employer]. This information is not intended to replace advice given to you by your health care provider. Make sure you discuss any questions you have with your health care provider. °Bulb Drain Home Care °A bulb drain consists of a thin rubber tube and a soft, round bulb that creates a gentle suction. The rubber tube is placed in the area where you had surgery. A bulb is attached to the end of the tube that is outside the body. The bulb drain removes excess fluid that normally builds up in a surgical wound after surgery. The color and amount of fluid will vary. Immediately after surgery, the fluid is bright red and is a little thicker than water. It may gradually change to a yellow or pink color and become more thin and water-like. When the amount decreases to about 1 or 2 tbsp in 24 hours, your health care provider will usually remove it. °DAILY CARE °· Keep the bulb flat (compressed) at all times, except while emptying it. The flatness creates suction. You can flatten the bulb by squeezing it firmly in the middle and then closing the cap. °· Keep sites where the tube enters the skin dry and covered with a bandage (dressing). °· Secure the tube 1-2 in (2.5-5.1 cm) below the insertion sites   to keep it from pulling on your stitches. The tube is stitched in place and will not slip out. °· Secure the bulb as directed by your health care provider. °· For the first 3 days after surgery, there usually is more fluid in the bulb. Empty the bulb whenever it becomes half full because the bulb does not create enough suction if it is too full. The bulb could also overflow. Write down how much fluid you remove each time you empty your drain. Add up the amount removed in 24 hours. °· Empty the bulb at the same time every day once the amount of fluid decreases and you only need to empty it once a day. Write down the amounts and the 24-hour totals to give to your health care provider. This helps your health care provider know  when the tubes can be removed. °EMPTYING THE BULB DRAIN °Before emptying the bulb, get a measuring cup, a piece of paper and a pen, and wash your hands. °· Gently run your fingers down the tube (stripping) to empty any drainage from the tubing into the bulb. This may need to be done several times a day to clear the tubing of clots and tissue. °· Open the bulb cap to release suction, which causes it to inflate. Do not touch the inside of the cap. °· Gently run your fingers down the tube (stripping) to empty any drainage from the tubing into the bulb. °· Hold the cap out of the way, and pour fluid into the measuring cup.   °· Squeeze the bulb to provide suction.  °· Replace the cap.   °· Check the tape that holds the tube to your skin. If it is becoming loose, you can remove the loose piece of tape and apply a new one. Then, pin the bulb to your shirt.   °· Write down the amount of fluid you emptied out. Write down the date and each time you emptied your bulb drain. (If there are 2 bulbs, note the amount of drainage from each bulb and keep the totals separate. Your health care provider will want to know the total amounts for each drain and which tube is draining more.)   °· Flush the fluid down the toilet and wash your hands.   °· Call your health care provider once you have less than 2 tbsp of fluid collecting in the bulb drain every 24 hours. °If there is drainage around the tube site, change dressings and keep the area dry. Cleanse around tube with sterile saline and place dry gauze around site. This gauze should be changed when it is soiled. If it stays clean and unsoiled, it should still be changed daily.  °SEEK MEDICAL CARE IF: °· Your drainage has a bad smell or is cloudy.   °· You have a fever.   °· Your drainage is increasing instead of decreasing.   °· Your tube fell out.   °· You have redness or swelling around the tube site.   °· You have drainage from a surgical wound.   °· Your bulb drain will not stay  flat after you empty it.   °MAKE SURE YOU:  °· Understand these instructions. °· Will watch your condition. °· Will get help right away if you are not doing well or get worse. °Document Released: 06/14/2000 Document Revised: 11/01/2013 Document Reviewed: 11/20/2011 °ExitCare® Patient Information ©2015 [employer]. This information is not intended to replace advice given to you by your health care provider. Make sure you discuss any questions you have with your health care provider. ° °

## 2014-01-28 ENCOUNTER — Encounter (HOSPITAL_COMMUNITY): Payer: Self-pay | Admitting: General Surgery

## 2014-02-23 ENCOUNTER — Encounter (HOSPITAL_COMMUNITY): Payer: Self-pay

## 2014-02-23 ENCOUNTER — Encounter (HOSPITAL_COMMUNITY): Attending: Hematology and Oncology

## 2014-02-23 VITALS — BP 127/72 | HR 79 | Temp 98.0°F | Resp 18 | Ht 66.0 in | Wt 240.0 lb

## 2014-02-23 DIAGNOSIS — D0590 Unspecified type of carcinoma in situ of unspecified breast: Secondary | ICD-10-CM | POA: Diagnosis not present

## 2014-02-23 DIAGNOSIS — Z853 Personal history of malignant neoplasm of breast: Secondary | ICD-10-CM

## 2014-02-23 DIAGNOSIS — E7800 Pure hypercholesterolemia, unspecified: Secondary | ICD-10-CM | POA: Insufficient documentation

## 2014-02-23 DIAGNOSIS — E559 Vitamin D deficiency, unspecified: Secondary | ICD-10-CM | POA: Diagnosis not present

## 2014-02-23 DIAGNOSIS — Z9010 Acquired absence of unspecified breast and nipple: Secondary | ICD-10-CM | POA: Diagnosis not present

## 2014-02-23 DIAGNOSIS — D0512 Intraductal carcinoma in situ of left breast: Secondary | ICD-10-CM

## 2014-02-23 DIAGNOSIS — C50912 Malignant neoplasm of unspecified site of left female breast: Secondary | ICD-10-CM

## 2014-02-23 DIAGNOSIS — C50919 Malignant neoplasm of unspecified site of unspecified female breast: Secondary | ICD-10-CM

## 2014-02-23 LAB — CBC WITH DIFFERENTIAL/PLATELET
BASOS PCT: 1 % (ref 0–1)
Basophils Absolute: 0.1 10*3/uL (ref 0.0–0.1)
EOS ABS: 0.3 10*3/uL (ref 0.0–0.7)
Eosinophils Relative: 5 % (ref 0–5)
HCT: 34.4 % — ABNORMAL LOW (ref 36.0–46.0)
HEMOGLOBIN: 11.5 g/dL — AB (ref 12.0–15.0)
Lymphocytes Relative: 28 % (ref 12–46)
Lymphs Abs: 1.7 10*3/uL (ref 0.7–4.0)
MCH: 31.2 pg (ref 26.0–34.0)
MCHC: 33.4 g/dL (ref 30.0–36.0)
MCV: 93.2 fL (ref 78.0–100.0)
Monocytes Absolute: 0.7 10*3/uL (ref 0.1–1.0)
Monocytes Relative: 12 % (ref 3–12)
NEUTROS PCT: 56 % (ref 43–77)
Neutro Abs: 3.4 10*3/uL (ref 1.7–7.7)
Platelets: 363 10*3/uL (ref 150–400)
RBC: 3.69 MIL/uL — ABNORMAL LOW (ref 3.87–5.11)
RDW: 13.3 % (ref 11.5–15.5)
WBC: 6.1 10*3/uL (ref 4.0–10.5)

## 2014-02-23 LAB — COMPREHENSIVE METABOLIC PANEL
ALBUMIN: 3.6 g/dL (ref 3.5–5.2)
ALK PHOS: 86 U/L (ref 39–117)
ALT: 25 U/L (ref 0–35)
AST: 30 U/L (ref 0–37)
Anion gap: 12 (ref 5–15)
BUN: 9 mg/dL (ref 6–23)
CO2: 26 mEq/L (ref 19–32)
CREATININE: 0.75 mg/dL (ref 0.50–1.10)
Calcium: 9.3 mg/dL (ref 8.4–10.5)
Chloride: 102 mEq/L (ref 96–112)
GFR calc Af Amer: >90 mL/min (ref >90)
GFR calc non Af Amer: 88 mL/min — ABNORMAL LOW (ref >90)
Glucose, Bld: 99 mg/dL (ref 70–99)
POTASSIUM: 3.9 meq/L (ref 3.7–5.3)
Sodium: 140 mEq/L (ref 137–147)
TOTAL PROTEIN: 7.3 g/dL (ref 6.0–8.3)
Total Bilirubin: 0.3 mg/dL (ref 0.3–1.2)

## 2014-02-23 MED ORDER — ANASTROZOLE 1 MG PO TABS: 1.0000 mg | ORAL_TABLET | Freq: Every day | ORAL | Status: DC

## 2014-02-23 NOTE — Progress Notes (Signed)
1420:  Stephanie Jarvis presented for labwork. Labs per MD order drawn via Peripheral Line 23 gauge needle inserted in left hand. Good blood return present. Procedure without incident. Needle removed intact. Patient tolerated procedure well.

## 2014-02-23 NOTE — Patient Instructions (Signed)
[facility] Discharge Instructions  RECOMMENDATIONS MADE BY THE CONSULTANT AND ANY TEST RESULTS WILL BE SENT TO YOUR REFERRING PHYSICIAN.  Return in one month for office visit.  Thank you for choosing [facility] to provide your oncology and hematology care.  To afford each patient quality time with our providers, please arrive at least 15 minutes before your scheduled appointment time.  With your help, our goal is to use those 15 minutes to complete the necessary work-up to ensure our physicians have the information they need to help with your evaluation and healthcare recommendations.    Effective January 1st, 2014, we ask that you re-schedule your appointment with our physicians should you arrive 10 or more minutes late for your appointment.  We strive to give you quality time with our providers, and arriving late affects you and other patients whose appointments are after yours.    Again, thank you for choosing [facility].  Our hope is that these requests will decrease the amount of time that you wait before being seen by our physicians.       _____________________________________________________________  Should you have questions after your visit to [facility], please contact our office at [phone] between the hours of 8:30 a.m. and 4:30 p.m.  Voicemails left after 4:30 p.m. will not be returned until the following business day.  For prescription refill requests, have your pharmacy contact our office with your prescription refill request.    _______________________________________________________________  We hope that we have given you very good care.  You may receive a patient satisfaction survey in the mail, please complete it and return it as soon as possible.  We value your feedback!  _______________________________________________________________  Have you asked about our STAR program?  STAR stands for Survivorship  Training and Rehabilitation, and this is a nationally recognized cancer care program that focuses on survivorship and rehabilitation.  Cancer and cancer treatments may cause problems, such as, pain, making you feel tired and keeping you from doing the things that you need or want to do. Cancer rehabilitation can help. Our goal is to reduce these troubling effects and help you have the best quality of life possible.  You may receive a survey from a nurse that asks questions about your current state of health.  Based on the survey results, all eligible patients will be referred to the [facility] program for an evaluation so we can better serve you!  A frequently asked questions sheet is available upon request.

## 2014-02-23 NOTE — Progress Notes (Signed)
[place] A. Barnet Glasgow, M.D.  NEW PATIENT EVALUATION   Name: Stephanie Jarvis Date: 02/23/2014 MRN: [id number] DOB: 11/21/1950  PCP: [name], MD   REFERRING PHYSICIAN: Caren Macadam, MD  REASON FOR REFERRAL: Newly diagnosed left breast cancer.     HISTORY OF PRESENT ILLNESS:Stephanie Jarvis is a 63 y.o. female who is referred by her family physician and surgeon for evaluation of newly diagnosed left breast cancer in the setting of previous left breast cancer diagnosed in 2008. She denies any chest pain, left upper extremity lymphedema, cough, wheezing, bone pain, abdominal pain, nausea, vomiting, diarrhea, constipation, dysuria, hematuria, lower extremity swelling or redness, vaginal discharge or itching, bone pain, skin rash, headache, or seizures. She moved here from [place] 3 years ago to live with her mom. She is originally from [place].    PAST MEDICAL HISTORY:  has a past medical history of Arthritis; Knee pain; Ankle pain; Irregular heartbeat; High cholesterol; Complication of anesthesia; Dysrhythmia (2011); Hepatitis; GERD (gastroesophageal reflux disease); H/O hiatal hernia (2003); Family history of anesthesia complication; Anxiety; and Sciatica.   Past oncologic history: 11/03/2006: Left lumpectomy and sentinel node biopsy, pT1c,N0,M0 ductal carcinoma, ER/PR positive, HER-2/neu negative, Oncotype DX score 35 treated postoperatively with Taxotere/Cytoxan for 4 cycles ending on 02/12/2007 followed by radiotherapy to the left breast in conjunction with anastrozole which was started in August of 2008 and which was completed in August of 2013.  PAST SURGICAL HISTORY: Past Surgical History  Procedure Laterality Date  . Tubal ligation  1980  . Hiatal hernia repair  2003  . Lymph node removal  2008  . Total knee arthroplasty  2006  . Total knee arthroplasty  08/12/2011    Procedure: TOTAL KNEE ARTHROPLASTY;   Surgeon: Arther Abbott, MD;  Location: [facility];  Service: Orthopedics;  Laterality: Left;  . Breast lumpectomy Left 1982,2008    x 2   . Mastectomy modified radical Left 01/26/2014    Procedure: LEFT MODIFIED RADICAL MASTECTOMY;  Surgeon: Jamesetta So, MD;  Location: [facility];  Service: General;  Laterality: Left;     CURRENT MEDICATIONS: has a current medication list which includes the following prescription(s): bupropion, fexofenadine, furosemide, hydrocodone-acetaminophen, lorazepam, metoprolol tartrate, multiple vitamins-minerals, omeprazole, rosuvastatin, sertraline, anastrozole, and methocarbamol.   ALLERGIES: Review of patient's allergies indicates no known allergies.   SOCIAL HISTORY:  reports that she quit smoking about 15 years ago. She does not have any smokeless tobacco history on file. She reports that she does not drink alcohol or use illicit drugs.   FAMILY HISTORY: family history includes Arthritis in an other family member; Cancer in an other family member; Heart disease in an other family member; Kidney disease in an other family member; Lung disease in an other family member; Stroke in her father.    REVIEW OF SYSTEMS:  Other than that discussed above is noncontributory.    PHYSICAL EXAM:  height is 5' 6"  (1.676 m) and weight is 240 lb (108.863 kg). Her oral temperature is 98 F (36.7 C). Her blood pressure is 127/72 and her pulse is 79. Her respiration is 18.    GENERAL:alert, no distress and comfortable. Moderately obese. SKIN: skin color, texture, turgor are normal, no rashes or significant lesions EYES: normal, Conjunctiva are pink and non-injected, sclera clear OROPHARYNX:no exudate, no erythema and lips, buccal mucosa, and tongue normal  NECK: supple, thyroid normal size, non-tender, without nodularity CHEST: Status post left  mastectomy with no subcutaneous nodules. Right breast without mass. LYMPH:  no palpable lymphadenopathy in the cervical, axillary or  inguinal LUNGS: clear to auscultation and percussion with normal breathing effort HEART: regular rate & rhythm and no murmurs ABDOMEN:abdomen soft, non-tender and normal bowel sounds MUSCULOSKELETALl:no cyanosis of digits, no clubbing or edema  NEURO: alert & oriented x 3 with fluent speech, no focal motor/sensory deficits    LABORATORY DATA:  Office Visit on 02/23/2014  Component Date Value Ref Range Status  . WBC 02/23/2014 6.1  4.0 - 10.5 K/uL Final  . RBC 02/23/2014 3.69* 3.87 - 5.11 MIL/uL Final  . Hemoglobin 02/23/2014 11.5* 12.0 - 15.0 g/dL Final  . HCT 02/23/2014 34.4* 36.0 - 46.0 % Final  . MCV 02/23/2014 93.2  78.0 - 100.0 fL Final  . MCH 02/23/2014 31.2  26.0 - 34.0 pg Final  . MCHC 02/23/2014 33.4  30.0 - 36.0 g/dL Final  . RDW 02/23/2014 13.3  11.5 - 15.5 % Final  . Platelets 02/23/2014 363  150 - 400 K/uL Final  . Neutrophils Relative % 02/23/2014 56  43 - 77 % Final  . Neutro Abs 02/23/2014 3.4  1.7 - 7.7 K/uL Final  . Lymphocytes Relative 02/23/2014 28  12 - 46 % Final  . Lymphs Abs 02/23/2014 1.7  0.7 - 4.0 K/uL Final  . Monocytes Relative 02/23/2014 12  3 - 12 % Final  . Monocytes Absolute 02/23/2014 0.7  0.1 - 1.0 K/uL Final  . Eosinophils Relative 02/23/2014 5  0 - 5 % Final  . Eosinophils Absolute 02/23/2014 0.3  0.0 - 0.7 K/uL Final  . Basophils Relative 02/23/2014 1  0 - 1 % Final  . Basophils Absolute 02/23/2014 0.1  0.0 - 0.1 K/uL Final  . Sodium 02/23/2014 140  137 - 147 mEq/L Final  . Potassium 02/23/2014 3.9  3.7 - 5.3 mEq/L Final  . Chloride 02/23/2014 102  96 - 112 mEq/L Final  . CO2 02/23/2014 26  19 - 32 mEq/L Final  . Glucose, Bld 02/23/2014 99  70 - 99 mg/dL Final  . BUN 02/23/2014 9  6 - 23 mg/dL Final  . Creatinine, Ser 02/23/2014 0.75  0.50 - 1.10 mg/dL Final  . Calcium 02/23/2014 9.3  8.4 - 10.5 mg/dL Final  . Total Protein 02/23/2014 7.3  6.0 - 8.3 g/dL Final  . Albumin 02/23/2014 3.6  3.5 - 5.2 g/dL Final  . AST 02/23/2014 30  0 - 37 U/L  Final  . ALT 02/23/2014 25  0 - 35 U/L Final  . Alkaline Phosphatase 02/23/2014 86  39 - 117 U/L Final  . Total Bilirubin 02/23/2014 0.3  0.3 - 1.2 mg/dL Final  . GFR calc non Af Amer 02/23/2014 88* >90 mL/min Final  . GFR calc Af Amer 02/23/2014 >90  >90 mL/min Final   Comment: (NOTE)                          The eGFR has been calculated using the CKD EPI equation.                          This calculation has not been validated in all clinical situations.                          eGFR's persistently <90 mL/min signify possible Chronic Kidney  Disease.  . Anion gap 02/23/2014 12  5 - 15 Final  Admission on 01/26/2014, Discharged on 01/27/2014  Component Date Value Ref Range Status  . WBC 01/27/2014 9.0  4.0 - 10.5 K/uL Final  . RBC 01/27/2014 3.57* 3.87 - 5.11 MIL/uL Final  . Hemoglobin 01/27/2014 11.3* 12.0 - 15.0 g/dL Final  . HCT 01/27/2014 34.0* 36.0 - 46.0 % Final  . MCV 01/27/2014 95.2  78.0 - 100.0 fL Final  . MCH 01/27/2014 31.7  26.0 - 34.0 pg Final  . MCHC 01/27/2014 33.2  30.0 - 36.0 g/dL Final  . RDW 01/27/2014 14.0  11.5 - 15.5 % Final  . Platelets 01/27/2014 249  150 - 400 K/uL Final  . Sodium 01/27/2014 137  137 - 147 mEq/L Final  . Potassium 01/27/2014 4.1  3.7 - 5.3 mEq/L Final  . Chloride 01/27/2014 99  96 - 112 mEq/L Final  . CO2 01/27/2014 29  19 - 32 mEq/L Final  . Glucose, Bld 01/27/2014 101* 70 - 99 mg/dL Final  . BUN 01/27/2014 13  6 - 23 mg/dL Final  . Creatinine, Ser 01/27/2014 0.73  0.50 - 1.10 mg/dL Final  . Calcium 01/27/2014 9.1  8.4 - 10.5 mg/dL Final  . GFR calc non Af Amer 01/27/2014 88* >90 mL/min Final  . GFR calc Af Amer 01/27/2014 >90  >90 mL/min Final   Comment: (NOTE)                          The eGFR has been calculated using the CKD EPI equation.                          This calculation has not been validated in all clinical situations.                          eGFR's persistently <90 mL/min signify possible  Chronic Kidney                          Disease.  . Anion gap 01/27/2014 9  5 - 15 Final    Urinalysis No results found for this basename: colorurine,  appearanceur,  labspec,  phurine,  glucoseu,  hgbur,  bilirubinur,  ketonesur,  proteinur,  urobilinogen,  nitrite,  leukocytesur      @RADIOGRAPHY :  MM Digital Diagnostic Bilat Status: Final result            Study Result    CLINICAL DATA: Annual examination. History of left breast cancer,  status post lumpectomy in 2008. Patient is asymptomatic.  EXAM:  DIGITAL DIAGNOSTIC BILATERAL MAMMOGRAM WITH CAD  COMPARISON: With priors  ACR Breast Density Category c: The breast tissue is heterogeneously  dense, which may obscure small masses.  FINDINGS:  There are stable lumpectomy changes in the deep central left breast.  In the deep upper outer left breast is a new 44m group of  calcifications that were evaluated with magnification views. Some of  these calcifications are linear, and the calcifications are linearly  oriented. These calcifications vary in size, shape, and density.  There is no associated mass.  No mass, distortion, or suspicious microcalcification is identified  in the right breast to suggest malignancy.  Mammographic images were processed with CAD.  IMPRESSION:  New group of pleomorphic calcifications in the upper outer left  breast are suspicious for ductal carcinoma  in situ.  RECOMMENDATION:  Stereotactic biopsy is recommended and has been scheduled at the  [facility] on Friday December 03, 2013 at 9 o'clock a.m. The procedure  of stereotactic biopsy was discussed with the patient today.  I have discussed the findings and recommendations with the patient.  Results were also provided in writing at the conclusion of the  visit. If applicable, a reminder letter will be sent to the patient  regarding the next appointment.  BI-RADS CATEGORY 4: Suspicious abnormality - biopsy should be  considered.    Electronically Signed  By: Curlene Dolphin M.D.  On: 11/24/2013 14    MR Breast Bilateral W Wo Contrast Status: Final result         PACS Images    Show images for MR Breast Bilateral W Wo Contrast         Study Result    CLINICAL DATA: Patient has a history of malignant lumpectomy of the  left breast 2008. 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 Pre-surgical  evaluation.  LABS: BUN and creatinine were obtained on site at [facility] at  [address]  Results: BUN 10 mg/dL, Creatinine 0.7 mg/dL.  EXAM:  BILATERAL BREAST MRI WITH AND WITHOUT CONTRAST  TECHNIQUE:  Multiplanar, multisequence MR images of both breasts were obtained  prior to and following the intravenous administration of 20 ml of  MultiHance  THREE-DIMENSIONAL MR IMAGE RENDERING ON INDEPENDENT WORKSTATION:  Three-dimensional MR images were rendered by post-processing of the  original MR data on an independent workstation. The  three-dimensional MR images were interpreted, and findings are  reported in the following complete MRI report for this study. Three  dimensional images were evaluated at the independent DynaCad  workstation  COMPARISON: Mammograms dated 12/08/2013, 11/24/2013, 11/18/2012,  11/13/2011.  FINDINGS:  Breast composition: c: Heterogeneous fibroglandular tissue  Background parenchymal enhancement: Moderate  Right breast: There is no abnormal enhancement within the right  breast. There is a stable normal appearing intramammary lymph node  noted laterally within the right breast.  Left breast: Post biopsy changes are seen within the upper outer  quadrant of the left breast (posterior 1/3) in the area of the  patient's previous stereotactic core biopsy. There are no areas of  worrisome enhancement within the left breast. There are scarring   changes present related to patient's previous lumpectomy.  Lymph nodes: No abnormal appearing lymph nodes.  Ancillary findings: None.  IMPRESSION:  Post biopsy changes located within the left breast laterally  (upper-outer quadrant) related to the patient's recent stereotactic  biopsy. Also, postsurgical scarring changes within the left breast.  No areas of worrisome enhancement within the right breast and no  evidence for adenopathy.  RECOMMENDATION:  Treatment plan  BI-RADS CATEGORY 6: Known biopsy-proven malignancy.  Electronically Signed  By: Luberta Robertson M.D.  On: 12/28/2013 15:24       PATHOLOGY:  FINAL for KEYLY, BALDONADO ([id number]) Patient: VIENNE, CORCORAN Collected: 12/08/2013 Client: [facility] Accession: [id number] Received: 12/08/2013 Trudee Kuster, MD DOB: 1951/05/02 Age: 68 Gender: F Reported: 12/09/2013 [address] [place] [address] Patient Ph: [phone] MRN #: [id number] [place], [place] [address] Client Acc#: Chart #: [id number] Phone: Fax: CC: [facility] INTERNAL CC cc: Micheline Rough, MD REPORT OF SURGICAL PATHOLOGY ADDITIONAL INFORMATION: PROGNOSTIC INDICATORS - ACIS Results: IMMUNOHISTOCHEMICAL AND MORPHOMETRIC ANALYSIS BY THE AUTOMATED  CELLULAR IMAGING SYSTEM (ACIS) Estrogen Receptor: 100%, POSITIVE, STRONG STAINING INTENSITY Progesterone Receptor: 31%, POSITIVE, MODERATE STAINING INTENSITY Proliferation Marker Ki67: 4% REFERENCE RANGE ESTROGEN RECEPTOR NEGATIVE <1% POSITIVE =>1% PROGESTERONE RECEPTOR NEGATIVE <1% POSITIVE =>1% All controls stained appropriately Enid Cutter MD Pathologist, Electronic Signature ( Signed 12/15/2013) CHROMOGENIC IN-SITU HYBRIDIZATION Results: THERE IS INSUFFICIENT INVASIVE TUMOR PRESENT FOR HER2NEU ANALYSIS. Enid Cutter MD Pathologist, Electronic Signature ( Signed 12/15/2013) 1 of 2 FINAL for CAROLYNNE, SCHUCHARD ([id number]) FINAL DIAGNOSIS Diagnosis Breast, left, needle core  biopsy, upper outer - INVASIVE DUCTAL CARCINOMA. - DUCTAL CARCINOMA IN SITU. - SEE COMMENT. Microscopic Comment The carcinoma is grade I. A breast prognostic profile will be performed and the results reported separately. The results were called to the [facility] on 12/09/2013. (JBK:kh 12/09/13) Enid Cutter MD Pathologist, Electronic Signature (Case signed 12/09/2013) Specimen Gross and Clinical Information Specimen Comment Suspicious left breast calcifications near lumpectomy site Prior left breast CA / lumpectomy In formalin 1:40 Specimen(s) Obtained: Breast, left, needle core biopsy, upper outer Specimen Clinical Information R/O DCIS Gross Received in formalin (or a dish) are 11 cores of tan yellow tissue measuring 0.7 x 0.5 x 0.3 to 5.5 x 0.5 x 0.2 cm Time in Formalin 1:40 pm Block Summary: A = 4 cores circled on dish. B = 3 cores C = 4 cores Three blocks submitted. Disclaimer Her2Neu by CISH (chromogenic in-situ hybridization) is performed at [facility], using the [employer]on pharmDx Kit (code number [id number]). This test is used to detect the amplification of the Her2Neu gene in interphase nuclei from formalin fixed, paraffin embedded tissue and is reported using ASCO/CAP scoring criteria published in 2013. PR progesterone receptor (PgR 636), immunohistochemical stains are performed on formalin fixed, paraffin embedded tissue using a 3,3"-diaminobenzidine (DAB) chromogen and DAKO Autostainer System. The staining intensity of the nucleus is scored morphometrically using the Automated Cellular Imaging System (ACIS) and is reported as the percentage of tumor cell nuclei demonstrating specific nuclear staining. Estrogen receptor (SP1), immunohistochemical stains are performed on formalin fixed, paraffin embedded tissue using a 3,3"-diaminobenzidine (DAB) chromogen and DAKO Autostainer System. The staining intensity of the nucleus is scored morphometrically using the  Automated Cellular Imaging System (ACIS) and is reported as the percentage of tumor cell nuclei demonstrating specific nuclear staining. Ki-67 (Mib-1), immunohistochemical stains are performed on formalin fixed, paraffin embedded tissue using a 3,3"-diaminobenzidine (DAB) chromogen and [place]. The staining intensity of the nucleus is scored morphometrically using the Automated Cellular Imaging System (ACIS) and is reported as the percentage of tumor cell nuclei demonstrating specific nuclear staining. Report signed out from the following location(s) Technical Component performed at [facility]. [address],[place] [address].FMBW:[id number]., Interpretation performed at [facility] [place], [place], [place] [address].   FINAL for ALESSA, MAZUR ([id number]) Patient: EVALIE, HARGRAVES Collected: 01/26/2014 Client: [facility] Accession: [id number] Received: 01/26/2014 [facility] DOB: [birth year] Age: 37 Gender: F Reported: 02/01/2014 [address] Patient Ph: [phone] MRN #: [id number] Linna Hoff [place] [address] Visit #: [id number] Chart #: Phone: [phone] Fax: CC: REPORT OF SURGICAL PATHOLOGY ADDITIONAL INFORMATION: Section of the skin papule shows a melanocytic nevus with no evidence of atypia or malignancy. Aldona Bar MD Pathologist, Electronic Signature ( Signed 02/01/2014) FINAL DIAGNOSIS Diagnosis Breast, modified radical mastectomy , left INTERMEDIATE GRADE DUCTAL CARCINOMA IN SITU, 0.5 CM. PREVIOUS BIOPSY SITE CHANGES. SIX LYMPH NODES, NEGATIVE FOR MALIGNANCY (0/6) RESECTION MARGINS, NEGATIVE FOR ATYPIA OR MALIGNANCY. Microscopic Comment  BREAST, IN SITU CARCINOMA Specimen, including laterality: Left breast Procedure (include lymph node sampling): Left modified radical mastectomy Grade of carcinoma: Intermediate grade Necrosis: No Estimated tumor size: ( glass slide  measurement): 0.5 cm Treatment effect: No Distance to closest margin: Deep margin: >1.6 cm Breast prognostic profile: Please correlate with previous case. Estrogen receptor:Positive Progesterone receptor: Positive Lymph nodes: Examined: 0 Sentinel 6 Non-sentinel 6Total Lymph nodes with metastasis:0 1 of 3 FINAL for KIMBLE, DELAURENTIS ([id number]) Microscopic Comment(continued) Isolated tumor cells (< 0.2 mm): 0 Micrometastasis ( > 0.2 mm and < 2.0 mm): 0 Macrometastasis (> 2.0 mm): 0 Extranodal extension:0 TNM: pTis, pN0 Comments: The previous biopsy site with clips was extensively sampled and multiple additional blocks were submitted for microscopic examination. Confirmatory immostains for myoepithelial markers SMA,calponin and myosin, CK 5/6 and CK were performed and sections show no evidence of invasive tumor. Dr. Gari Crown agrees. Aldona Bar MD Pathologist, Electronic Signature (Case signed 02/01/2014) Specimen Gross and Clinical Information Specimen(s) Obtained: Breast, modified radical mastectomy , left Specimen Clinical Information left breast cancer Gross Specimen: Left mastectomy, short suture at superior and long suture lateral, including left axillary contents. Specimen integrity (intact/disrupted): Axillary contents are received separate in the same container Weight: 682 grams Size: The breast is 21 x 15 cm and is up to 6 cm thick. Skin: There is an 18 x 5 cm tan white skin ellipse with a 4.5 cm in diameter flattened nipple and surrounding areola. 4 cm lateral to the nipple is a 1 cm in diameter slightly raised tan skin papule. Tumor/cavity: Within the superior to mid lateral specimen is a metallic clip embedded within an area of gray white indurated and vaguely nodular fibrous tissue, 2.1 x 1.8 x 1.5 cm. This area of indurated nodular fibrous tissue is 1.6 cm from the deep margin. No other discrete lesions or masses are identified. Uninvolved parenchyma: The  remaining specimen consists predominantly of soft fatty tissue with a moderate amount of tan white soft to rubbery fibrous tissue, which is located predominantly within the central to inferior lateral specimen. Found within areas of this fibrous tissue are embedded metallic staples. Prognostic indicators: Not taken at time of gross. Lymph nodes: The portion of axillary contents separate in the container is 6.2 x 4.6 x 2 cm in aggregate. Within this portion of tissue are found nine possible soft to rubbery fatty lymph nodes ranging from 0.7 to 2.8 cm. Block summary: A-D = area of indurated nodular fibrous tissue containing clip E, F = deep margin nearest tissue with clip G = inferior lateral H = central I = superior medial J = inferior medial K = section of skin papule L = three possible nodes, whole M = two possible nodes, whole N = two possible nodes, whole O = one node, bisected P-R = largest node S-U= additional sections near clip Total = twenty-one blocks. (SSW:kh 01-26-14) 2 of 3 FINAL for ANGELYSE, HESLIN ([id number]) Stain(s) used in Diagnosis: The following stain(s) were used in diagnosing the case: Smooth Muscle Myosin - 1 Heavy Chain, Calponin, CK 5/6, CK AE1AE3, P63. The control(s) stained appropriately. Disclaimer Some of these immunohistochemical stains may have been developed and the performance characteristics determined by [facility]. Some may not have been cleared or approved by the U.S. Food and Drug Administration. The FDA has determined that such clearance or approval is not necessary. This test is used for clinical purposes. It should not be regarded as investigational or for research. This laboratory  is certified under the [place] (CLIA-88) as qualified to perform high complexity clinical laboratory testing. Report signed out from the following location(s) Technical Component performed at [facility] [address], [place], [place] [address] [place] [id number]??, Technical Component performed at [facility], [place], [place] [address]. CLIA #: [id number], Interpretation performed at [facility] [address], [place], [place] [address] CLIA [id number]??,  IMPRESSION:  #1. Stage I (Ta N0M0) ER/PR positive, HER-2/neu unknown, Ki-67 of 4%, status post left simple mastectomy, no evidence of disease. #2. 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 #3. Hypertension, controlled. #4. Gastroesophageal reflux disease, on treatment. #5. Chronic back pain, taking appropriate analgesics and muscle relaxants. #6. Allergic rhinitis, taking Allegra. #7. Status post left ankle fracture in April 2015, resolved.   PLAN:  #1. Adjuvant endocrine treatment with anastrozole 1 mg daily for 5 years is recommended. #2. Baseline DEXA scan. Osteopenia is found, appropriate intervention will be instituted, probably PROLIA every 6 months. #3. Baseline tumor markers and vitamin D level. #4. Followup in one month for tolerability. Patient did tolerate 5 years of the drug in the past and is no reason to suspect that she will not tolerate it now.   Doroteo Bradford, MD 02/23/2014 4:47 PM   DISCLAIMER:  This note was dictated with voice recognition softwre.  Similar sounding words can inadvertently be transcribed inaccurately and may not be corrected upon review.

## 2014-02-24 ENCOUNTER — Other Ambulatory Visit (HOSPITAL_COMMUNITY): Payer: Self-pay | Admitting: Oncology

## 2014-02-24 DIAGNOSIS — C50919 Malignant neoplasm of unspecified site of unspecified female breast: Secondary | ICD-10-CM

## 2014-02-24 LAB — VITAMIN D 25 HYDROXY (VIT D DEFICIENCY, FRACTURES): VIT D 25 HYDROXY: 42 ng/mL (ref 30–89)

## 2014-02-24 LAB — CEA: CEA: 0.7 ng/mL (ref 0.0–5.0)

## 2014-02-24 LAB — CANCER ANTIGEN 27.29: CA 27.29: 10 U/mL (ref 0–39)

## 2014-03-09 ENCOUNTER — Ambulatory Visit (HOSPITAL_COMMUNITY)
Admission: RE | Admit: 2014-03-09 | Discharge: 2014-03-09 | Disposition: A | Discharge status: Home / Self Care | Source: Ambulatory Visit | Attending: Oncology | Admitting: Oncology

## 2014-03-09 DIAGNOSIS — Z780 Asymptomatic menopausal state: Secondary | ICD-10-CM | POA: Diagnosis present

## 2014-03-09 DIAGNOSIS — E559 Vitamin D deficiency, unspecified: Secondary | ICD-10-CM | POA: Diagnosis not present

## 2014-03-09 DIAGNOSIS — C50919 Malignant neoplasm of unspecified site of unspecified female breast: Secondary | ICD-10-CM | POA: Diagnosis not present

## 2014-03-16 ENCOUNTER — Other Ambulatory Visit (HOSPITAL_COMMUNITY): Payer: Self-pay | Admitting: Oncology

## 2014-03-16 DIAGNOSIS — C50919 Malignant neoplasm of unspecified site of unspecified female breast: Secondary | ICD-10-CM

## 2014-03-16 MED ORDER — ANASTROZOLE 1 MG PO TABS: 1.0000 mg | ORAL_TABLET | Freq: Every day | ORAL | Status: DC

## 2014-03-17 ENCOUNTER — Other Ambulatory Visit (HOSPITAL_COMMUNITY): Payer: Self-pay

## 2014-03-24 ENCOUNTER — Encounter (HOSPITAL_COMMUNITY): Payer: Self-pay

## 2014-03-24 ENCOUNTER — Other Ambulatory Visit (HOSPITAL_COMMUNITY)

## 2014-03-24 ENCOUNTER — Encounter (HOSPITAL_COMMUNITY): Attending: Hematology and Oncology

## 2014-03-24 VITALS — BP 130/69 | HR 80 | Temp 97.9°F | Resp 18 | Wt 241.3 lb

## 2014-03-24 DIAGNOSIS — D0512 Intraductal carcinoma in situ of left breast: Secondary | ICD-10-CM

## 2014-03-24 DIAGNOSIS — M25569 Pain in unspecified knee: Secondary | ICD-10-CM | POA: Insufficient documentation

## 2014-03-24 DIAGNOSIS — I499 Cardiac arrhythmia, unspecified: Secondary | ICD-10-CM | POA: Insufficient documentation

## 2014-03-24 DIAGNOSIS — Z809 Family history of malignant neoplasm, unspecified: Secondary | ICD-10-CM | POA: Insufficient documentation

## 2014-03-24 DIAGNOSIS — Z9010 Acquired absence of unspecified breast and nipple: Secondary | ICD-10-CM | POA: Insufficient documentation

## 2014-03-24 DIAGNOSIS — C50919 Malignant neoplasm of unspecified site of unspecified female breast: Secondary | ICD-10-CM

## 2014-03-24 DIAGNOSIS — Z170 Estrogen receptor positive status [ER+]: Secondary | ICD-10-CM | POA: Insufficient documentation

## 2014-03-24 DIAGNOSIS — C50912 Malignant neoplasm of unspecified site of left female breast: Secondary | ICD-10-CM

## 2014-03-24 DIAGNOSIS — J309 Allergic rhinitis, unspecified: Secondary | ICD-10-CM

## 2014-03-24 DIAGNOSIS — Z96659 Presence of unspecified artificial knee joint: Secondary | ICD-10-CM | POA: Insufficient documentation

## 2014-03-24 DIAGNOSIS — E559 Vitamin D deficiency, unspecified: Secondary | ICD-10-CM | POA: Insufficient documentation

## 2014-03-24 DIAGNOSIS — Z9889 Other specified postprocedural states: Secondary | ICD-10-CM | POA: Insufficient documentation

## 2014-03-24 DIAGNOSIS — IMO0001 Reserved for inherently not codable concepts without codable children: Secondary | ICD-10-CM | POA: Insufficient documentation

## 2014-03-24 DIAGNOSIS — Z87891 Personal history of nicotine dependence: Secondary | ICD-10-CM | POA: Insufficient documentation

## 2014-03-24 DIAGNOSIS — M129 Arthropathy, unspecified: Secondary | ICD-10-CM | POA: Insufficient documentation

## 2014-03-24 DIAGNOSIS — B159 Hepatitis A without hepatic coma: Secondary | ICD-10-CM | POA: Insufficient documentation

## 2014-03-24 DIAGNOSIS — Z79899 Other long term (current) drug therapy: Secondary | ICD-10-CM | POA: Insufficient documentation

## 2014-03-24 DIAGNOSIS — M25579 Pain in unspecified ankle and joints of unspecified foot: Secondary | ICD-10-CM | POA: Insufficient documentation

## 2014-03-24 DIAGNOSIS — D0590 Unspecified type of carcinoma in situ of unspecified breast: Secondary | ICD-10-CM | POA: Insufficient documentation

## 2014-03-24 DIAGNOSIS — Z853 Personal history of malignant neoplasm of breast: Secondary | ICD-10-CM

## 2014-03-24 DIAGNOSIS — E7800 Pure hypercholesterolemia, unspecified: Secondary | ICD-10-CM | POA: Insufficient documentation

## 2014-03-24 DIAGNOSIS — Z823 Family history of stroke: Secondary | ICD-10-CM | POA: Insufficient documentation

## 2014-03-24 DIAGNOSIS — I10 Essential (primary) hypertension: Secondary | ICD-10-CM

## 2014-03-24 DIAGNOSIS — G8929 Other chronic pain: Secondary | ICD-10-CM

## 2014-03-24 DIAGNOSIS — K219 Gastro-esophageal reflux disease without esophagitis: Secondary | ICD-10-CM

## 2014-03-24 DIAGNOSIS — M549 Dorsalgia, unspecified: Secondary | ICD-10-CM

## 2014-03-24 DIAGNOSIS — Z8249 Family history of ischemic heart disease and other diseases of the circulatory system: Secondary | ICD-10-CM | POA: Insufficient documentation

## 2014-03-24 DIAGNOSIS — F411 Generalized anxiety disorder: Secondary | ICD-10-CM | POA: Insufficient documentation

## 2014-03-24 MED ORDER — METHOCARBAMOL 500 MG PO TABS: 500.0000 mg | ORAL_TABLET | Freq: Every evening | ORAL | Status: DC | PRN

## 2014-03-24 MED ORDER — HYDROCODONE-ACETAMINOPHEN 5-325 MG PO TABS: 1.0000 | ORAL_TABLET | Freq: Four times a day (QID) | ORAL | Status: DC | PRN

## 2014-03-24 NOTE — Progress Notes (Signed)
[place]  OFFICE PROGRESS NOTE  [name], MD [address] [place] [address]  DIAGNOSIS: Invasive ductal carcinoma of left breast, stage 1 - Plan: CBC with Differential, CEA, Cancer antigen 27.29, Comprehensive metabolic panel  DCIS (ductal carcinoma in situ) of breast, left  History of left breast cancer  Chief Complaint  Patient presents with  . Left breast cancer    CURRENT THERAPY: Anastrozole 1 mg daily  INTERVAL HISTORY: Stephanie Jarvis 63 y.o. female returns for followup of newly diagnosed breast cancer in the left breast previously affected with breast cancer in 2008. She has develop more hot flashes since starting the anastrozole. No increase in joint discomfort or muscle aches. Appetite is good with no nausea, vomiting, but with looser bowel movements without melena, hematochezia, or incontinence. Skin is dry her. She denies any worsening vaginal dryness. No cough, wheezing, or worsening swelling of the left upper extremity.  MEDICAL HISTORY: Past Medical History  Diagnosis Date  . Arthritis   . Knee pain   . Ankle pain   . Irregular heartbeat   . High cholesterol   . Complication of anesthesia     Aspirates when having per patient  . Dysrhythmia 2011    Atrial Fibrillation  . Hepatitis     Hepatitis A when 63 years old  . GERD (gastroesophageal reflux disease)   . H/O hiatal hernia 2003  . Family history of anesthesia complication   . Anxiety   . Sciatica     INTERIM HISTORY: has OA (osteoarthritis) of knee; Pain in joint, lower leg; Stiffness of joint, lower leg; Difficulty in walking; Swelling of joint of left knee; PTTD (posterior tibial tendon dysfunction); Metatarsal fracture; and Breast cancer on her problem list.   Past oncologic history:  11/03/2006: Left lumpectomy and sentinel node biopsy, pT1c,N0,M0 ductal carcinoma, ER/PR positive, HER-2/neu negative, Oncotype DX score 35  treated postoperatively with Taxotere/Cytoxan for 4 cycles ending on 02/12/2007 followed by radiotherapy to the left breast in conjunction with anastrozole which was started in August of 2008 and which was completed in August of 2013. 12/08/2013: Left needle core biopsy upper-outer quadrant breast, ER positive, PR positive, HER-2/neu insufficient tissue 01/26/2014: Left modified radical mastectomy with no residual invasive cancer but with evidence of noninvasive neoplasia (0.5cm) with 6 negative lymph nodes. 02/23/2014: anastrozole was restarted at 1 mg daily.  ALLERGIES:  has No Known Allergies.  MEDICATIONS: has a current medication list which includes the following prescription(s): anastrozole, bupropion, fexofenadine, furosemide, hydrocodone-acetaminophen, lorazepam, methocarbamol, metoprolol tartrate, multiple vitamins-minerals, omeprazole, rosuvastatin, and sertraline.  SURGICAL HISTORY:  Past Surgical History  Procedure Laterality Date  . Tubal ligation  1980  . Hiatal hernia repair  2003  . Lymph node removal  2008  . Total knee arthroplasty  2006  . Total knee arthroplasty  08/12/2011    Procedure: TOTAL KNEE ARTHROPLASTY;  Surgeon: Arther Abbott, MD;  Location: [facility];  Service: Orthopedics;  Laterality: Left;  . Breast lumpectomy Left 1982,2008    x 2   . Mastectomy modified radical Left 01/26/2014    Procedure: LEFT MODIFIED RADICAL MASTECTOMY;  Surgeon: Jamesetta So, MD;  Location: [facility];  Service: General;  Laterality: Left;    FAMILY HISTORY: family history includes Arthritis in an other family member; Cancer in an other family member; Heart disease in an other family member; Kidney disease in an other family member; Lung disease in an other family  member; Stroke in her father.  SOCIAL HISTORY:  reports that she quit smoking about 15 years ago. She does not have any smokeless tobacco history on file. She reports that she does not drink alcohol or use illicit drugs.  REVIEW  OF SYSTEMS:  Other than that discussed above is noncontributory.  PHYSICAL EXAMINATION: ECOG PERFORMANCE STATUS: 1 - Symptomatic but completely ambulatory  Blood pressure 130/69, pulse 80, temperature 97.9 F (36.6 C), temperature source Oral, resp. rate 18, weight 241 lb 4.8 oz (109.453 kg), SpO2 95.00%.  GENERAL:alert, no distress and comfortable. Moderately obese. SKIN: skin color, texture, turgor are normal, no rashes or significant lesions EYES: PERLA; Conjunctiva are pink and non-injected, sclera clear SINUSES: No redness or tenderness over maxillary or ethmoid sinuses OROPHARYNX:no exudate, no erythema on lips, buccal mucosa, or tongue. NECK: supple, thyroid normal size, non-tender, without nodularity. No masses CHEST: Status post left mastectomy with prominent scar formation in the axilla as well as the chest wall. No subcutaneous nodules. LYMPH:  no palpable lymphadenopathy in the cervical, axillary or inguinal LUNGS: clear to auscultation and percussion with normal breathing effort HEART: regular rate & rhythm and no murmurs. ABDOMEN:abdomen soft, non-tender and normal bowel sounds. Obese with no organomegaly, ascites, or CVA tenderness. MUSCULOSKELETAL:no cyanosis of digits and no clubbing. Range of motion normal.  NEURO: alert & oriented x 3 with fluent speech, no focal motor/sensory deficits   LABORATORY DATA: Office Visit on 02/23/2014  Component Date Value Ref Range Status  . WBC 02/23/2014 6.1  4.0 - 10.5 K/uL Final  . RBC 02/23/2014 3.69* 3.87 - 5.11 MIL/uL Final  . Hemoglobin 02/23/2014 11.5* 12.0 - 15.0 g/dL Final  . HCT 02/23/2014 34.4* 36.0 - 46.0 % Final  . MCV 02/23/2014 93.2  78.0 - 100.0 fL Final  . MCH 02/23/2014 31.2  26.0 - 34.0 pg Final  . MCHC 02/23/2014 33.4  30.0 - 36.0 g/dL Final  . RDW 02/23/2014 13.3  11.5 - 15.5 % Final  . Platelets 02/23/2014 363  150 - 400 K/uL Final  . Neutrophils Relative % 02/23/2014 56  43 - 77 % Final  . Neutro Abs  02/23/2014 3.4  1.7 - 7.7 K/uL Final  . Lymphocytes Relative 02/23/2014 28  12 - 46 % Final  . Lymphs Abs 02/23/2014 1.7  0.7 - 4.0 K/uL Final  . Monocytes Relative 02/23/2014 12  3 - 12 % Final  . Monocytes Absolute 02/23/2014 0.7  0.1 - 1.0 K/uL Final  . Eosinophils Relative 02/23/2014 5  0 - 5 % Final  . Eosinophils Absolute 02/23/2014 0.3  0.0 - 0.7 K/uL Final  . Basophils Relative 02/23/2014 1  0 - 1 % Final  . Basophils Absolute 02/23/2014 0.1  0.0 - 0.1 K/uL Final  . Sodium 02/23/2014 140  137 - 147 mEq/L Final  . Potassium 02/23/2014 3.9  3.7 - 5.3 mEq/L Final  . Chloride 02/23/2014 102  96 - 112 mEq/L Final  . CO2 02/23/2014 26  19 - 32 mEq/L Final  . Glucose, Bld 02/23/2014 99  70 - 99 mg/dL Final  . BUN 02/23/2014 9  6 - 23 mg/dL Final  . Creatinine, Ser 02/23/2014 0.75  0.50 - 1.10 mg/dL Final  . Calcium 02/23/2014 9.3  8.4 - 10.5 mg/dL Final  . Total Protein 02/23/2014 7.3  6.0 - 8.3 g/dL Final  . Albumin 02/23/2014 3.6  3.5 - 5.2 g/dL Final  . AST 02/23/2014 30  0 - 37 U/L Final  . ALT  02/23/2014 25  0 - 35 U/L Final  . Alkaline Phosphatase 02/23/2014 86  39 - 117 U/L Final  . Total Bilirubin 02/23/2014 0.3  0.3 - 1.2 mg/dL Final  . GFR calc non Af Amer 02/23/2014 88* >90 mL/min Final  . GFR calc Af Amer 02/23/2014 >90  >90 mL/min Final   Comment: (NOTE)                          The eGFR has been calculated using the CKD EPI equation.                          This calculation has not been validated in all clinical situations.                          eGFR's persistently <90 mL/min signify possible Chronic Kidney                          Disease.  . Anion gap 02/23/2014 12  5 - 15 Final  . CA 27.29 02/23/2014 10  0 - 39 U/mL Final   Performed at [facility]  . CEA 02/23/2014 0.7  0.0 - 5.0 ng/mL Final   Performed at [facility]  . Vit D, 25-Hydroxy 02/23/2014 42  30 - 89 ng/mL Final   Comment: (NOTE)                          This assay accurately  quantifies Vitamin D, which is the sum of the                          25-Hydroxy forms of Vitamin D2 and D3.  Studies have shown that the                          optimum concentration of 25-Hydroxy Vitamin D is 30 ng/mL or higher.                           Concentrations of Vitamin D between 20 and 29 ng/mL are considered to                          be insufficient and concentrations less than 20 ng/mL are considered                          to be deficient for Vitamin D.                          Performed at [facility]: ER/PR positive, HER-2/neu not done because of inadequate tissue.   Urinalysis No results found for this basename: colorurine,  appearanceur,  labspec,  phurine,  glucoseu,  hgbur,  bilirubinur,  ketonesur,  proteinur,  urobilinogen,  nitrite,  leukocytesur    RADIOGRAPHIC STUDIES: Dg Bone Density  Normal 03/16/14  16-Mar-2014   EXAM: DG DXA BONE DENSITY STUDY  The Bone Mineral Densitometry hard-copy report (which includes all data, graphical display, and FRAX results when applicable) has been sent directly to the ordering physician.  This report can also be obtained electronically  by viewing images for this exam through the performing facility's EMR, or by logging directly into [employer].   Electronically Signed   By: Skipper Cliche M.D.   On: 03/09/2014 11:15    ASSESSMENT:  #1. Stage I (Ta N0M0) ER/PR positive, HER-2/neu unknown, Ki-67 of 4%, status post left simple mastectomy, no evidence of disease.  #2. 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  #3. Hypertension, controlled.  #4. Gastroesophageal reflux disease, on treatment.  #5. Chronic back  pain, taking appropriate analgesics and muscle relaxants.  #6. Allergic rhinitis, taking Allegra.  #7. Status post left ankle fracture in April 2015, resolved   PLAN:  #1. Continue anastrozole 1 mg daily. #2. Followup in 6 months with CBC, chem profile, CEA, CA 27-29.   All questions were answered. The patient knows to call the clinic with any problems, questions or concerns. We can certainly see the patient much sooner if necessary.   I spent 25 minutes counseling the patient face to face. The total time spent in the appointment was 30 minutes.    Doroteo Bradford, MD 03/24/2014 1:33 PM  DISCLAIMER:  This note was dictated with voice recognition software.  Similar sounding words can inadvertently be transcribed inaccurately and may not be corrected upon review.

## 2014-03-24 NOTE — Patient Instructions (Signed)
[place] Discharge Instructions  RECOMMENDATIONS MADE BY THE CONSULTANT AND ANY TEST RESULTS WILL BE SENT TO YOUR REFERRING PHYSICIAN.  EXAM FINDINGS BY THE PHYSICIAN TODAY AND SIGNS OR SYMPTOMS TO REPORT TO CLINIC OR PRIMARY PHYSICIAN: You saw Dr Barnet Glasgow today  Follow up in 6 months with the doctor and for lab work.  Please call the clinic if you have any questions or concerns   Thank you for choosing [facility] to provide your oncology and hematology care.  To afford each patient quality time with our providers, please arrive at least 15 minutes before your scheduled appointment time.  With your help, our goal is to use those 15 minutes to complete the necessary work-up to ensure our physicians have the information they need to help with your evaluation and healthcare recommendations.    Effective January 1st, 2014, we ask that you re-schedule your appointment with our physicians should you arrive 10 or more minutes late for your appointment.  We strive to give you quality time with our providers, and arriving late affects you and other patients whose appointments are after yours.    Again, thank you for choosing [facility].  Our hope is that these requests will decrease the amount of time that you wait before being seen by our physicians.       _____________________________________________________________  Should you have questions after your visit to [facility], please contact our office at [phone] between the hours of 8:30 a.m. and 5:00 p.m.  Voicemails left after 4:30 p.m. will not be returned until the following business day.  For prescription refill requests, have your pharmacy contact our office with your prescription refill request.

## 2014-04-21 DIAGNOSIS — Z23 Encounter for immunization: Secondary | ICD-10-CM | POA: Diagnosis not present

## 2014-05-30 ENCOUNTER — Other Ambulatory Visit (HOSPITAL_COMMUNITY): Payer: Self-pay | Admitting: Family Medicine

## 2014-05-30 ENCOUNTER — Ambulatory Visit (HOSPITAL_COMMUNITY)
Admission: RE | Admit: 2014-05-30 | Discharge: 2014-05-30 | Disposition: A | Discharge status: Home / Self Care | Payer: Medicare Other | Source: Ambulatory Visit | Attending: Family Medicine | Admitting: Family Medicine

## 2014-05-30 DIAGNOSIS — M542 Cervicalgia: Secondary | ICD-10-CM | POA: Insufficient documentation

## 2014-05-30 DIAGNOSIS — Z6841 Body Mass Index (BMI) 40.0 and over, adult: Secondary | ICD-10-CM | POA: Diagnosis not present

## 2014-05-30 DIAGNOSIS — M5030 Other cervical disc degeneration, unspecified cervical region: Secondary | ICD-10-CM | POA: Diagnosis not present

## 2014-06-21 ENCOUNTER — Other Ambulatory Visit (HOSPITAL_COMMUNITY): Payer: Self-pay | Admitting: Hematology and Oncology

## 2014-07-06 DIAGNOSIS — H2513 Age-related nuclear cataract, bilateral: Secondary | ICD-10-CM | POA: Diagnosis not present

## 2014-09-16 ENCOUNTER — Other Ambulatory Visit (HOSPITAL_COMMUNITY): Payer: Self-pay

## 2014-09-16 DIAGNOSIS — C50919 Malignant neoplasm of unspecified site of unspecified female breast: Secondary | ICD-10-CM

## 2014-09-21 ENCOUNTER — Encounter (HOSPITAL_COMMUNITY): Payer: Medicare Other | Attending: Hematology & Oncology

## 2014-09-21 DIAGNOSIS — C50912 Malignant neoplasm of unspecified site of left female breast: Secondary | ICD-10-CM | POA: Insufficient documentation

## 2014-09-21 DIAGNOSIS — Z9012 Acquired absence of left breast and nipple: Secondary | ICD-10-CM | POA: Insufficient documentation

## 2014-09-21 DIAGNOSIS — C50919 Malignant neoplasm of unspecified site of unspecified female breast: Secondary | ICD-10-CM

## 2014-09-21 LAB — COMPREHENSIVE METABOLIC PANEL
ALT: 21 U/L (ref 0–35)
ANION GAP: 8 (ref 5–15)
AST: 20 U/L (ref 0–37)
Albumin: 3.7 g/dL (ref 3.5–5.2)
Alkaline Phosphatase: 59 U/L (ref 39–117)
BILIRUBIN TOTAL: 0.7 mg/dL (ref 0.3–1.2)
BUN: 11 mg/dL (ref 6–23)
CO2: 29 mmol/L (ref 19–32)
CREATININE: 0.63 mg/dL (ref 0.50–1.10)
Calcium: 9 mg/dL (ref 8.4–10.5)
Chloride: 100 mmol/L (ref 96–112)
Glucose, Bld: 111 mg/dL — ABNORMAL HIGH (ref 70–99)
POTASSIUM: 3.2 mmol/L — AB (ref 3.5–5.1)
SODIUM: 137 mmol/L (ref 135–145)
Total Protein: 6.8 g/dL (ref 6.0–8.3)

## 2014-09-21 LAB — CBC WITH DIFFERENTIAL/PLATELET
Basophils Absolute: 0.1 10*3/uL (ref 0.0–0.1)
Basophils Relative: 1 % (ref 0–1)
EOS ABS: 0.2 10*3/uL (ref 0.0–0.7)
EOS PCT: 3 % (ref 0–5)
HCT: 34.6 % — ABNORMAL LOW (ref 36.0–46.0)
HEMOGLOBIN: 11.7 g/dL — AB (ref 12.0–15.0)
Lymphocytes Relative: 26 % (ref 12–46)
Lymphs Abs: 1.5 10*3/uL (ref 0.7–4.0)
MCH: 31.2 pg (ref 26.0–34.0)
MCHC: 33.8 g/dL (ref 30.0–36.0)
MCV: 92.3 fL (ref 78.0–100.0)
MONO ABS: 0.6 10*3/uL (ref 0.1–1.0)
MONOS PCT: 10 % (ref 3–12)
NEUTROS ABS: 3.4 10*3/uL (ref 1.7–7.7)
Neutrophils Relative %: 60 % (ref 43–77)
Platelets: 271 10*3/uL (ref 150–400)
RBC: 3.75 MIL/uL — ABNORMAL LOW (ref 3.87–5.11)
RDW: 13.2 % (ref 11.5–15.5)
WBC: 5.6 10*3/uL (ref 4.0–10.5)

## 2014-09-21 NOTE — Progress Notes (Signed)
LABS DRAWN

## 2014-09-22 ENCOUNTER — Other Ambulatory Visit: Payer: Self-pay

## 2014-09-22 ENCOUNTER — Ambulatory Visit (HOSPITAL_COMMUNITY)
Admission: RE | Admit: 2014-09-22 | Discharge: 2014-09-22 | Disposition: A | Discharge status: Home / Self Care | Payer: Medicare Other | Source: Ambulatory Visit | Attending: Hematology & Oncology | Admitting: Hematology & Oncology

## 2014-09-22 ENCOUNTER — Encounter (HOSPITAL_COMMUNITY): Payer: Self-pay | Admitting: Hematology & Oncology

## 2014-09-22 ENCOUNTER — Other Ambulatory Visit (HOSPITAL_COMMUNITY): Payer: Self-pay | Admitting: Hematology & Oncology

## 2014-09-22 ENCOUNTER — Encounter (HOSPITAL_BASED_OUTPATIENT_CLINIC_OR_DEPARTMENT_OTHER): Payer: Medicare Other | Admitting: Hematology & Oncology

## 2014-09-22 ENCOUNTER — Other Ambulatory Visit (HOSPITAL_COMMUNITY)

## 2014-09-22 VITALS — BP 132/91 | HR 94 | Temp 98.1°F | Resp 20 | Wt 244.6 lb

## 2014-09-22 DIAGNOSIS — W1830XA Fall on same level, unspecified, initial encounter: Secondary | ICD-10-CM | POA: Insufficient documentation

## 2014-09-22 DIAGNOSIS — M79641 Pain in right hand: Secondary | ICD-10-CM

## 2014-09-22 DIAGNOSIS — Z1231 Encounter for screening mammogram for malignant neoplasm of breast: Secondary | ICD-10-CM

## 2014-09-22 DIAGNOSIS — Z170 Estrogen receptor positive status [ER+]: Secondary | ICD-10-CM | POA: Diagnosis not present

## 2014-09-22 DIAGNOSIS — C50912 Malignant neoplasm of unspecified site of left female breast: Secondary | ICD-10-CM

## 2014-09-22 DIAGNOSIS — R0602 Shortness of breath: Secondary | ICD-10-CM | POA: Diagnosis not present

## 2014-09-22 DIAGNOSIS — C50412 Malignant neoplasm of upper-outer quadrant of left female breast: Secondary | ICD-10-CM | POA: Diagnosis not present

## 2014-09-22 DIAGNOSIS — S6991XA Unspecified injury of right wrist, hand and finger(s), initial encounter: Secondary | ICD-10-CM | POA: Diagnosis not present

## 2014-09-22 DIAGNOSIS — E876 Hypokalemia: Secondary | ICD-10-CM

## 2014-09-22 DIAGNOSIS — Z139 Encounter for screening, unspecified: Secondary | ICD-10-CM

## 2014-09-22 MED ORDER — POTASSIUM CHLORIDE CRYS ER 20 MEQ PO TBCR: 20.0000 meq | EXTENDED_RELEASE_TABLET | Freq: Two times a day (BID) | ORAL | Status: DC

## 2014-09-22 NOTE — Progress Notes (Signed)
Stephanie Morel, MD [address] [place] [address]    DIAGNOSIS: Stage I (Ta N0M0) ER/PR positive, HER-2/neu unknown, (insufficiency tumor for Her-2 status) Ki-67 of 4%, status post left simple mastectomy, no evidence of disease. Intermediate Grade DCIS   Remote history of stage I (T1c N0 M0) invasive lobular carcinoma of the left breast, status post lumpectomy, sentinel node biopsy, Oncotype DX score 35 with ER PR positive, HER-2/neu negative, treated with lumpectomy, 4 cycles of Taxotere/Cytoxan ending on 02/12/2007, radiotherapy to the left breast with anastrozole 1 mg daily from 01/2007 until 01/2012. (Treated in [place])  [place] 03/09/2014 with normal bone density  CURRENT THERAPY: Arimidex 1 mg daily  INTERVAL HISTORY: Stephanie Jarvis 64 y.o. female returns for follow-up of a stage I ER positive, PR positive, HER-2 unknown breast cancer of the left breast. She was originally diagnosed with breast cancer in the left breast in 2008. She was treated with surgery, chemotherapy and radiation at that time. She states she took Arimidex for approximately 5 years.   She was then diagnosed with a second breast cancer in the left breast in 2015. He has undergone a left mastectomy. She was started back on Arimidex last year.  Her major concerns today revolve around the recent death of her mother. 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 She reports she also fell this morning and took the brunt of the fall on her right hand. Her hand is quite bruised and very sore.  She is due for a mammogram of the right breast in May. She has had a colonoscopy, the last one 5 years ago in [place]. She states there were no problems reported. She does not take calcium but does take a multivitamin daily. She is up-to-date on DEXA screening. She notes shortness of breath over the last month and is not sure why she feels so winded at times.  MEDICAL HISTORY: Past Medical History  Diagnosis Date  .  Arthritis   . Knee pain   . Ankle pain   . Irregular heartbeat   . High cholesterol   . Complication of anesthesia     Aspirates when having per patient  . Dysrhythmia 2011    Atrial Fibrillation  . Hepatitis     Hepatitis A when 64 years old  . GERD (gastroesophageal reflux disease)   . H/O hiatal hernia 2003  . Family history of anesthesia complication   . Anxiety   . Sciatica     has OA (osteoarthritis) of knee; Pain in joint, lower leg; Stiffness of joint, lower leg; Difficulty in walking; Swelling of joint of left knee; PTTD (posterior tibial tendon dysfunction); Metatarsal fracture; and Breast cancer on her problem list.     has No Known Allergies.  Ms. Mangal does not currently have medications on file.  SURGICAL HISTORY: Past Surgical History  Procedure Laterality Date  . Tubal ligation  1980  . Hiatal hernia repair  2003  . Lymph node removal  2008  . Total knee arthroplasty  2006  . Total knee arthroplasty  08/12/2011    Procedure: TOTAL KNEE ARTHROPLASTY;  Surgeon: Arther Abbott, MD;  Location: [facility];  Service: Orthopedics;  Laterality: Left;  . Breast lumpectomy Left 1982,2008    x 2   . Mastectomy modified radical Left 01/26/2014    Procedure: LEFT MODIFIED RADICAL MASTECTOMY;  Surgeon: Jamesetta So, MD;  Location: [facility];  Service: General;  Laterality: Left;    SOCIAL HISTORY: History  Social History  . Marital Status: Widowed    Spouse Name: N/A  . Number of Children: N/A  . Years of Education: 12   Occupational History  . retired    Social History Main Topics  . Smoking status: Former Smoker -- 25 years    Quit date: 06/30/1998  . Smokeless tobacco: Not on file  . Alcohol Use: No  . Drug Use: No  . Sexual Activity: Yes    Birth Control/ Protection: Surgical   Other Topics Concern  . Not on file   Social History Narrative  . No narrative on file    FAMILY HISTORY: Family History  Problem Relation Age of Onset  . Heart disease     . Arthritis    . Lung disease    . Cancer    . Stroke Father   . Kidney disease      Review of Systems  Constitutional: Positive for malaise/fatigue. Negative for fever, chills and weight loss.  HENT: Negative for congestion, hearing loss, nosebleeds, sore throat and tinnitus.   Eyes: Negative for blurred vision, double vision, pain and discharge.  Respiratory: Positive for shortness of breath. Negative for cough, hemoptysis, sputum production and wheezing.   Cardiovascular: Negative for chest pain, palpitations, claudication, leg swelling and PND.  Gastrointestinal: Negative for heartburn, nausea, vomiting, abdominal pain, diarrhea, constipation, blood in stool and melena.  Genitourinary: Negative for dysuria, urgency, frequency and hematuria.  Musculoskeletal: Positive for joint pain. Negative for myalgias and falls.  Skin: Negative for itching and rash.  Neurological: Negative for dizziness, tingling, tremors, sensory change, speech change, focal weakness, seizures, loss of consciousness, weakness and headaches.  Endo/Heme/Allergies: Does not bruise/bleed easily.  Psychiatric/Behavioral: Negative for depression, suicidal ideas, memory loss and substance abuse. The patient is not nervous/anxious and does not have insomnia.     PHYSICAL EXAMINATION  ECOG PERFORMANCE STATUS: 0 - Asymptomatic  There were no vitals filed for this visit.  Physical Exam  Constitutional: She is oriented to person, place, and time and well-developed, well-nourished, and in no distress.  HENT:  Head: Normocephalic and atraumatic.  Nose: Nose normal.  Mouth/Throat: Oropharynx is clear and moist. No oropharyngeal exudate.  Eyes: Conjunctivae and EOM are normal. Pupils are equal, round, and reactive to light. Right eye exhibits no discharge. Left eye exhibits no discharge. No scleral icterus.  Neck: Normal range of motion. Neck supple. No tracheal deviation present. No thyromegaly present.    Cardiovascular: Normal rate, regular rhythm and normal heart sounds.  Exam reveals no gallop and no friction rub.   No murmur heard. Pulmonary/Chest: Effort normal and breath sounds normal. She has no wheezes. She has no rales.    Abdominal: Soft. Bowel sounds are normal. She exhibits no distension and no mass. There is no tenderness. There is no rebound and no guarding.  Musculoskeletal: Normal range of motion. She exhibits no edema.  R hand with significant bruising along the 3rd finger extending onto the surface of the hand. Point tenderness at the base of the thumb  Lymphadenopathy:    She has no cervical adenopathy.  Neurological: She is alert and oriented to person, place, and time. She has normal reflexes. No cranial nerve deficit. Gait normal. Coordination normal.  Skin: Skin is warm and dry. No rash noted.  Psychiatric: Mood, memory, affect and judgment normal.  Nursing note and vitals reviewed.   LABORATORY DATA:  CBC    Component Value Date/Time   WBC 5.6 09/21/2014 0855   RBC  3.75* 09/21/2014 0855   HGB 11.7* 09/21/2014 0855   HCT 34.6* 09/21/2014 0855   PLT 271 09/21/2014 0855   MCV 92.3 09/21/2014 0855   MCH 31.2 09/21/2014 0855   MCHC 33.8 09/21/2014 0855   RDW 13.2 09/21/2014 0855   LYMPHSABS 1.5 09/21/2014 0855   MONOABS 0.6 09/21/2014 0855   EOSABS 0.2 09/21/2014 0855   BASOSABS 0.1 09/21/2014 0855   CMP     Component Value Date/Time   NA 137 09/21/2014 0855   K 3.2* 09/21/2014 0855   CL 100 09/21/2014 0855   CO2 29 09/21/2014 0855   GLUCOSE 111* 09/21/2014 0855   BUN 11 09/21/2014 0855   CREATININE 0.63 09/21/2014 0855   CALCIUM 9.0 09/21/2014 0855   PROT 6.8 09/21/2014 0855   ALBUMIN 3.7 09/21/2014 0855   AST 20 09/21/2014 0855   ALT 21 09/21/2014 0855   ALKPHOS 59 09/21/2014 0855   BILITOT 0.7 09/21/2014 0855   GFRNONAA >90 09/21/2014 0855   GFRAA >90 09/21/2014 0855     ASSESSMENT and THERAPY PLAN:   Stage I ER+, PR+ , Her-2 neu L  breast cancer History of lobular carcinoma of the L breast in 2008, ER+  Pleasant 64 year old female with a history of stage I ER positive, PR positive carcinoma left breast. There was not enough tumor to determine HER-2 status. She is currently on Arimidex. Last DEXA showed normal bone density. Last vitamin D level was normal. She does not take calcium and vitamin D daily. She only takes a multi-vitamin. I have encouraged her to at least supplement with 600 mg of calcium plus D daily. She will continue on Arimidex and we will see her back again in 6 months.  She is due for another mammogram in May and we will arrange this for her. In regards to her shortness of breath she is going to follow-up with her primary care physician next week. She is up-to-date on screening colonoscopy. We will try to obtain records. I am going to get plain films of her hand today given her point tenderness; we will advise her of the results of those x-rays when they become available.  All questions were answered. The patient knows to call the clinic with any problems, questions or concerns. We can certainly see the patient much sooner if necessary. This note was electronically signed. Molli Hazard 09/22/2014

## 2014-09-22 NOTE — Patient Instructions (Signed)
[place] at [facility] Discharge Instructions  RECOMMENDATIONS MADE BY THE CONSULTANT AND ANY TEST RESULTS WILL BE SENT TO YOUR REFERRING PHYSICIAN.  Exam and discussion by Dr. Whitney Muse. Will get an xray of your hand today. Potassium e-scribed  - take as directed Report any new lumps, bone pain, shortness of breath or other symptoms.  Follow-up: Lab work next week to recheck your potassium Mammogram in May Labs and office visit in 4 months.  Thank you for choosing [place] at [facility] to provide your oncology and hematology care.  To afford each patient quality time with our provider, please arrive at least 15 minutes before your scheduled appointment time.    You need to re-schedule your appointment should you arrive 10 or more minutes late.  We strive to give you quality time with our providers, and arriving late affects you and other patients whose appointments are after yours.  Also, if you no show three or more times for appointments you may be dismissed from the clinic at the providers discretion.     Again, thank you for choosing [facility].  Our hope is that these requests will decrease the amount of time that you wait before being seen by our physicians.       _____________________________________________________________  Should you have questions after your visit to [facility], please contact our office at [phone] between the hours of 8:30 a.m. and 4:30 p.m.  Voicemails left after 4:30 p.m. will not be returned until the following business day.  For prescription refill requests, have your pharmacy contact our office.

## 2014-09-23 ENCOUNTER — Encounter (HOSPITAL_COMMUNITY): Payer: Self-pay | Admitting: Emergency Medicine

## 2014-09-23 ENCOUNTER — Other Ambulatory Visit: Payer: Self-pay

## 2014-09-23 ENCOUNTER — Emergency Department (HOSPITAL_COMMUNITY): Payer: Medicare Other

## 2014-09-23 ENCOUNTER — Emergency Department (HOSPITAL_COMMUNITY)
Admission: EM | Admit: 2014-09-23 | Discharge: 2014-09-23 | Disposition: A | Discharge status: Home / Self Care | Payer: Medicare Other | Attending: Emergency Medicine | Admitting: Emergency Medicine

## 2014-09-23 DIAGNOSIS — Z87891 Personal history of nicotine dependence: Secondary | ICD-10-CM | POA: Insufficient documentation

## 2014-09-23 DIAGNOSIS — M1990 Unspecified osteoarthritis, unspecified site: Secondary | ICD-10-CM | POA: Diagnosis not present

## 2014-09-23 DIAGNOSIS — Z8719 Personal history of other diseases of the digestive system: Secondary | ICD-10-CM | POA: Insufficient documentation

## 2014-09-23 DIAGNOSIS — J301 Allergic rhinitis due to pollen: Secondary | ICD-10-CM | POA: Insufficient documentation

## 2014-09-23 DIAGNOSIS — R531 Weakness: Secondary | ICD-10-CM | POA: Diagnosis not present

## 2014-09-23 DIAGNOSIS — Z8619 Personal history of other infectious and parasitic diseases: Secondary | ICD-10-CM | POA: Diagnosis not present

## 2014-09-23 DIAGNOSIS — Z79899 Other long term (current) drug therapy: Secondary | ICD-10-CM | POA: Insufficient documentation

## 2014-09-23 DIAGNOSIS — Z8659 Personal history of other mental and behavioral disorders: Secondary | ICD-10-CM | POA: Insufficient documentation

## 2014-09-23 DIAGNOSIS — J302 Other seasonal allergic rhinitis: Secondary | ICD-10-CM | POA: Diagnosis not present

## 2014-09-23 DIAGNOSIS — E780 Pure hypercholesterolemia: Secondary | ICD-10-CM | POA: Insufficient documentation

## 2014-09-23 DIAGNOSIS — Z8679 Personal history of other diseases of the circulatory system: Secondary | ICD-10-CM | POA: Diagnosis not present

## 2014-09-23 DIAGNOSIS — Z6841 Body Mass Index (BMI) 40.0 and over, adult: Secondary | ICD-10-CM | POA: Diagnosis not present

## 2014-09-23 DIAGNOSIS — R0602 Shortness of breath: Secondary | ICD-10-CM | POA: Diagnosis present

## 2014-09-23 LAB — CBC
HCT: 34.7 % — ABNORMAL LOW (ref 36.0–46.0)
HEMOGLOBIN: 11.7 g/dL — AB (ref 12.0–15.0)
MCH: 31.3 pg (ref 26.0–34.0)
MCHC: 33.7 g/dL (ref 30.0–36.0)
MCV: 92.8 fL (ref 78.0–100.0)
Platelets: 301 10*3/uL (ref 150–400)
RBC: 3.74 MIL/uL — ABNORMAL LOW (ref 3.87–5.11)
RDW: 13.3 % (ref 11.5–15.5)
WBC: 8.1 10*3/uL (ref 4.0–10.5)

## 2014-09-23 LAB — COMPREHENSIVE METABOLIC PANEL
ALBUMIN: 3.6 g/dL (ref 3.5–5.2)
ALT: 19 U/L (ref 0–35)
AST: 20 U/L (ref 0–37)
Alkaline Phosphatase: 64 U/L (ref 39–117)
Anion gap: 8 (ref 5–15)
BUN: 13 mg/dL (ref 6–23)
CHLORIDE: 102 mmol/L (ref 96–112)
CO2: 28 mmol/L (ref 19–32)
CREATININE: 0.73 mg/dL (ref 0.50–1.10)
Calcium: 8.9 mg/dL (ref 8.4–10.5)
GFR calc Af Amer: >90 mL/min (ref >90)
GFR, EST NON AFRICAN AMERICAN: 88 mL/min — AB (ref >90)
Glucose, Bld: 142 mg/dL — ABNORMAL HIGH (ref 70–99)
Potassium: 3.3 mmol/L — ABNORMAL LOW (ref 3.5–5.1)
SODIUM: 138 mmol/L (ref 135–145)
Total Bilirubin: 0.6 mg/dL (ref 0.3–1.2)
Total Protein: 6.9 g/dL (ref 6.0–8.3)

## 2014-09-23 LAB — D-DIMER, QUANTITATIVE: D-Dimer, Quant: 0.57 ug/mL-FEU — ABNORMAL HIGH (ref 0.00–0.48)

## 2014-09-23 LAB — TROPONIN I: Troponin I: <0.03 ng/mL (ref <0.031)

## 2014-09-23 MED ORDER — FLUTICASONE PROPIONATE 50 MCG/ACT NA SUSP: 2.0000 | Freq: Every day | NASAL | Status: DC

## 2014-09-23 NOTE — ED Notes (Signed)
PT ambulated in hall and maintained O2 sats >94% and denies any dizziness or SOB.

## 2014-09-23 NOTE — ED Notes (Signed)
MD at bedside. 

## 2014-09-23 NOTE — Discharge Instructions (Signed)
Use Afrin nasal spray twice a day for 3 or 4 days to help, as a decongestant. Get plenty of rest and drink a lot of fluids.   Allergic Rhinitis Allergic rhinitis is when the mucous membranes in the nose respond to allergens. Allergens are particles in the air that cause your body to have an allergic reaction. This causes you to release allergic antibodies. Through a chain of events, these eventually cause you to release histamine into the blood stream. Although meant to protect the body, it is this release of histamine that causes your discomfort, such as frequent sneezing, congestion, and an itchy, runny nose.  CAUSES  Seasonal allergic rhinitis (hay fever) is caused by pollen allergens that may come from grasses, trees, and weeds. Year-round allergic rhinitis (perennial allergic rhinitis) is caused by allergens such as house dust mites, pet dander, and mold spores.  SYMPTOMS   Nasal stuffiness (congestion).  Itchy, runny nose with sneezing and tearing of the eyes. DIAGNOSIS  Your health care provider can help you determine the allergen or allergens that trigger your symptoms. If you and your health care provider are unable to determine the allergen, skin or blood testing may be used. TREATMENT  Allergic rhinitis does not have a cure, but it can be controlled by:  Medicines and allergy shots (immunotherapy).  Avoiding the allergen. Hay fever may often be treated with antihistamines in pill or nasal spray forms. Antihistamines block the effects of histamine. There are over-the-counter medicines that may help with nasal congestion and swelling around the eyes. Check with your health care provider before taking or giving this medicine.  If avoiding the allergen or the medicine prescribed do not work, there are many new medicines your health care provider can prescribe. Stronger medicine may be used if initial measures are ineffective. Desensitizing injections can be used if medicine and avoidance  does not work. Desensitization is when a patient is given ongoing shots until the body becomes less sensitive to the allergen. Make sure you follow up with your health care provider if problems continue. HOME CARE INSTRUCTIONS It is not possible to completely avoid allergens, but you can reduce your symptoms by taking steps to limit your exposure to them. It helps to know exactly what you are allergic to so that you can avoid your specific triggers. SEEK MEDICAL CARE IF:   You have a fever.  You develop a cough that does not stop easily (persistent).  You have shortness of breath.  You start wheezing.  Symptoms interfere with normal daily activities. Document Released: 03/12/2001 Document Revised: 06/22/2013 Document Reviewed: 02/22/2013 [facility] Patient Information 2015 [place], [place]. This information is not intended to replace advice given to you by your health care provider. Make sure you discuss any questions you have with your health care provider.

## 2014-09-23 NOTE — ED Provider Notes (Signed)
CSN: [id number]     Arrival date & time 09/23/14  1023 History  This chart was scribed for Daleen Bo, MD by Stephania Fragmin, ED Scribe. This patient was seen in room APA05/APA05 and the patient's care was started at 11:17 AM.    Chief Complaint  Patient presents with  . Shortness of Breath   The history is provided by the patient. No language interpreter was used.     HPI Comments: Stephanie Jarvis is a 64 y.o. female with a history of cancer, Afib, and allergies who presents to the Emergency Department complaining of lightheadedness and weakness this morning. She states she noticed it when sitting and stood up, feeling acute unsteadiness. She endorses a little wheezing. She had seen her PCP Dr. Hilma Favors this morning, who sent her here, per nursing note. She denies any lightheadedness at this time, as it is alleviated by lying down. Patient has a history of Atrial Aibrillation, for which she takes Lopressor. She also has a history of breast cancer and had a mastectomy 01/26/2014, with oncological care provided by Dr. Whitney Muse. Patient last saw Dr. Whitney Muse yesterday, and was also evaluated her for right hand pain S/P a fall yesterday. Patient states she has allergies, for which she takes Allegra daily. She denies a history of asthma or bronchitis. She denies cough, fever, diarrhea, nausea, abdominal pain, chest pain, back pain, leg pain, leg swelling, rhinorrhea, sore throat, ear congestion, ear pain. She denies blood thinner use.    Past Medical History  Diagnosis Date  . Arthritis   . Knee pain   . Ankle pain   . Irregular heartbeat   . High cholesterol   . Complication of anesthesia     Aspirates when having per patient  . Dysrhythmia 2011    Atrial Fibrillation  . Hepatitis     Hepatitis A when 64 years old  . GERD (gastroesophageal reflux disease)   . H/O hiatal hernia 2003  . Family history of anesthesia complication   . Anxiety   . Sciatica    Past Surgical History  Procedure  Laterality Date  . Tubal ligation  1980  . Hiatal hernia repair  2003  . Lymph node removal  2008  . Total knee arthroplasty  2006  . Total knee arthroplasty  08/12/2011    Procedure: TOTAL KNEE ARTHROPLASTY;  Surgeon: Arther Abbott, MD;  Location: [facility];  Service: Orthopedics;  Laterality: Left;  . Breast lumpectomy Left 1982,2008    x 2   . Mastectomy modified radical Left 01/26/2014    Procedure: LEFT MODIFIED RADICAL MASTECTOMY;  Surgeon: Jamesetta So, MD;  Location: [facility];  Service: General;  Laterality: Left;   Family History  Problem Relation Age of Onset  . Heart disease    . Arthritis    . Lung disease    . Cancer    . Stroke Father   . Kidney disease     History  Substance Use Topics  . Smoking status: Former Smoker -- 25 years    Quit date: 06/30/1998  . Smokeless tobacco: Never Used  . Alcohol Use: No   OB History    No data available     Review of Systems  All other systems reviewed and are negative.     Allergies  Review of patient's allergies indicates no known allergies.  Home Medications   Prior to Admission medications   Medication Sig Start Date End Date Taking? Authorizing Provider  anastrozole (ARIMIDEX) 1 MG  tablet Take 1 tablet (1 mg total) by mouth daily. 03/16/14  Yes Manon Hilding Kefalas, PA-C  buPROPion (WELLBUTRIN XL) 300 MG 24 hr tablet Take 300 mg by mouth daily.   Yes Historical Provider, MD  calcium-vitamin D (OSCAL WITH D) 500-200 MG-UNIT per tablet Take 1 tablet by mouth daily with breakfast.   Yes Historical Provider, MD  cholecalciferol (VITAMIN D) 1000 UNITS tablet Take 1,000 Units by mouth daily.   Yes Historical Provider, MD  fexofenadine (ALLEGRA) 180 MG tablet Take 180 mg by mouth daily.   Yes Historical Provider, MD  furosemide (LASIX) 40 MG tablet Take 40 mg by mouth daily.   Yes Historical Provider, MD  ibuprofen (ADVIL,MOTRIN) 200 MG tablet Take 400 mg by mouth every 6 (six) hours as needed for moderate pain.   Yes  Historical Provider, MD  metoprolol tartrate (LOPRESSOR) 25 MG tablet Take 12.5 mg by mouth 2 (two) times daily. Half a tablet twice daily   Yes Historical Provider, MD  Multiple Vitamins-Minerals (MULTIVITAMIN PO) Take 1 tablet by mouth daily.    Yes Historical Provider, MD  omeprazole (PRILOSEC) 20 MG capsule Take 20 mg by mouth 2 (two) times daily.    Yes Historical Provider, MD  potassium chloride SA (K-DUR,KLOR-CON) 20 MEQ tablet Take 1 tablet (20 mEq total) by mouth 2 (two) times daily. 09/22/14  Yes Patrici Ranks, MD  rosuvastatin (CRESTOR) 10 MG tablet Take 10 mg by mouth daily.   Yes Historical Provider, MD  sertraline (ZOLOFT) 100 MG tablet Take 100 mg by mouth at bedtime.   Yes Historical Provider, MD  HYDROcodone-acetaminophen (NORCO/VICODIN) 5-325 MG per tablet Take 1-2 tablets by mouth every 6 (six) hours as needed for moderate pain. Patient not taking: Reported on 09/22/2014 03/24/14 03/24/15  Farrel Gobble, MD  methocarbamol (ROBAXIN) 500 MG tablet Take 1 tablet (500 mg total) by mouth at bedtime as needed for muscle spasms. Patient not taking: Reported on 09/22/2014 03/24/14   Farrel Gobble, MD   BP 132/75 mmHg  Pulse 75  Temp(Src) 97.7 F (36.5 C) (Oral)  Resp 18  Ht 5\' 6"  (1.676 m)  Wt 242 lb (109.77 kg)  BMI 39.08 kg/m2  SpO2 95% Physical Exam  Constitutional: She is oriented to person, place, and time. She appears well-developed and well-nourished.  HENT:  Head: Normocephalic and atraumatic.  Eyes: Conjunctivae and EOM are normal. Pupils are equal, round, and reactive to light.  Neck: Normal range of motion and phonation normal. Neck supple.  Cardiovascular: Normal rate, regular rhythm and normal heart sounds.   Pulmonary/Chest: Effort normal and breath sounds normal. She exhibits no tenderness.  Lungs are clear to auscultation.   Abdominal: Soft. She exhibits no distension. There is no tenderness. There is no guarding.  Musculoskeletal: Normal range of motion.   Neurological: She is alert and oriented to person, place, and time. She exhibits normal muscle tone.  Skin: Skin is warm and dry.  Psychiatric: She has a normal mood and affect. Her behavior is normal. Judgment and thought content normal.  Nursing note and vitals reviewed.   ED Course  Procedures (including critical care time)  Medications - No data to display   Patient Vitals for the past 24 hrs:  BP Temp Temp src Pulse Resp SpO2 Height Weight  09/23/14 1130 119/74 mmHg - - 72 22 95 % - -  09/23/14 1115 - - - 70 16 94 % - -  09/23/14 1100 - - - 95 26 95 % - -  09/23/14 1035 132/75 mmHg 97.7 F (36.5 C) Oral 75 18 95 % 5\' 6"  (1.676 m) 242 lb (109.77 kg)  09/23/14 1030 132/75 mmHg - - 80 - 96 % - -    DIAGNOSTIC STUDIES: Oxygen Saturation is 95% on room air, normal by my interpretation.    COORDINATION OF CARE: 11:23 PM - Discussed treatment plan with pt at bedside, and pt agreed to plan.    1:45 PM Reevaluation with update and discussion. After initial assessment and treatment, an updated evaluation reveals she is able to ambulate with a normal oxygen saturation and no dyspnea. Findings discussed with patient and family members, all questions answered.Daleen Bo L     Labs Review Labs Reviewed  CBC - Abnormal; Notable for the following:    RBC 3.74 (*)    Hemoglobin 11.7 (*)    HCT 34.7 (*)    All other components within normal limits  COMPREHENSIVE METABOLIC PANEL - Abnormal; Notable for the following:    Potassium 3.3 (*)    Glucose, Bld 142 (*)    GFR calc non Af Amer 88 (*)    All other components within normal limits  TROPONIN I  D-DIMER, QUANTITATIVE    Imaging Review Dg Chest 2 View  09/23/2014   CLINICAL DATA:  Shortness of breath.  Weakness.  EXAM: CHEST  2 VIEW  COMPARISON:  01/20/2014  FINDINGS: There is chronic cardiomegaly. Pulmonary vascularity is normal. Lungs are clear. No acute osseous abnormality. No effusions.  IMPRESSION: No acute  abnormality.  Chronic cardiomegaly.   Electronically Signed   By: Lorriane Shire M.D.   On: 09/23/2014 11:08   Dg Hand Complete Right  09/22/2014   CLINICAL DATA:  Fall in bathroom.  Pain.  Initial evaluation .  EXAM: RIGHT HAND - COMPLETE 3+ VIEW  COMPARISON:  None.  FINDINGS: Diffuse degenerative change. No acute bony or joint abnormality. No evidence of fracture or dislocation. No radiopaque foreign body.  IMPRESSION: Diffuse degenerative change.  No acute abnormality.   Electronically Signed   By: Marcello Moores  Register   On: 09/22/2014 16:21     EKG Interpretation None        Date: 09/23/14  Rate: 74  Rhythm: normal sinus rhythm  QRS Axis: normal  PR and QT Intervals: normal  ST/T Wave abnormalities: normal  PR and QRS Conduction Disutrbances:none  Narrative Interpretation:   Old EKG Reviewed: unchanged   MDM   Final diagnoses:  Seasonal allergies   Nonspecific dyspnea with history of seasonal allergies. She also has some dizziness and likely has allergic rhinitis/sinusitis as cause of her symptoms. She has a normal age adjusted d-dimer. I doubt PE, pneumonia, ACS, metabolic instability or serious bacterial infection.  Nursing Notes Reviewed/ Care Coordinated Applicable Imaging Reviewed Interpretation of Laboratory Data incorporated into ED treatment  The patient appears reasonably screened and/or stabilized for discharge and I doubt any other medical condition or other [facility] requiring further screening, evaluation, or treatment in the ED at this time prior to discharge.  Plan: Home Medications- Flonase, ASA; Home Treatments- rest, fluids; return here if the recommended treatment, does not improve the symptoms; Recommended follow up- PCP 1 week   I personally performed the services described in this documentation, which was scribed in my presence. The recorded information has been reviewed and is accurate.       Daleen Bo, MD 09/23/14 [phone]

## 2014-09-23 NOTE — ED Notes (Signed)
Pt reports sob,weakness x1 month. Pt denies any cp. Pt reports intermittent wheezing and chest tightness. Pt sent by Dr. Hilma Favors. nad noted.

## 2014-09-29 ENCOUNTER — Encounter (HOSPITAL_BASED_OUTPATIENT_CLINIC_OR_DEPARTMENT_OTHER): Payer: Medicare Other

## 2014-09-29 DIAGNOSIS — C50412 Malignant neoplasm of upper-outer quadrant of left female breast: Secondary | ICD-10-CM

## 2014-09-29 DIAGNOSIS — Z9012 Acquired absence of left breast and nipple: Secondary | ICD-10-CM | POA: Diagnosis not present

## 2014-09-29 DIAGNOSIS — C50912 Malignant neoplasm of unspecified site of left female breast: Secondary | ICD-10-CM | POA: Diagnosis not present

## 2014-09-29 DIAGNOSIS — E876 Hypokalemia: Secondary | ICD-10-CM

## 2014-09-29 LAB — BASIC METABOLIC PANEL
Anion gap: 6 (ref 5–15)
BUN: 12 mg/dL (ref 6–23)
CO2: 28 mmol/L (ref 19–32)
Calcium: 9.4 mg/dL (ref 8.4–10.5)
Chloride: 102 mmol/L (ref 96–112)
Creatinine, Ser: 0.7 mg/dL (ref 0.50–1.10)
GFR calc non Af Amer: 89 mL/min — ABNORMAL LOW (ref >90)
GLUCOSE: 97 mg/dL (ref 70–99)
POTASSIUM: 4.3 mmol/L (ref 3.5–5.1)
SODIUM: 136 mmol/L (ref 135–145)

## 2014-09-29 NOTE — Progress Notes (Signed)
LABS DRAWN

## 2014-11-29 ENCOUNTER — Inpatient Hospital Stay (HOSPITAL_COMMUNITY): Admission: RE | Admit: 2014-11-29 | Payer: Medicare Other | Source: Ambulatory Visit

## 2014-12-01 ENCOUNTER — Ambulatory Visit (HOSPITAL_COMMUNITY)
Admission: RE | Admit: 2014-12-01 | Discharge: 2014-12-01 | Disposition: A | Discharge status: Home / Self Care | Payer: Medicare Other | Source: Ambulatory Visit | Attending: Hematology & Oncology | Admitting: Hematology & Oncology

## 2014-12-01 DIAGNOSIS — Z1231 Encounter for screening mammogram for malignant neoplasm of breast: Secondary | ICD-10-CM | POA: Diagnosis not present

## 2014-12-21 DIAGNOSIS — R5383 Other fatigue: Secondary | ICD-10-CM | POA: Diagnosis not present

## 2014-12-21 DIAGNOSIS — E782 Mixed hyperlipidemia: Secondary | ICD-10-CM | POA: Diagnosis not present

## 2014-12-21 DIAGNOSIS — E538 Deficiency of other specified B group vitamins: Secondary | ICD-10-CM | POA: Diagnosis not present

## 2014-12-21 DIAGNOSIS — Z6841 Body Mass Index (BMI) 40.0 and over, adult: Secondary | ICD-10-CM | POA: Diagnosis not present

## 2014-12-21 DIAGNOSIS — R42 Dizziness and giddiness: Secondary | ICD-10-CM | POA: Diagnosis not present

## 2014-12-21 DIAGNOSIS — Z1389 Encounter for screening for other disorder: Secondary | ICD-10-CM | POA: Diagnosis not present

## 2014-12-21 DIAGNOSIS — R0683 Snoring: Secondary | ICD-10-CM | POA: Diagnosis not present

## 2014-12-21 DIAGNOSIS — M79671 Pain in right foot: Secondary | ICD-10-CM | POA: Diagnosis not present

## 2015-01-24 ENCOUNTER — Ambulatory Visit (HOSPITAL_COMMUNITY): Payer: Medicare Other | Admitting: Hematology & Oncology

## 2015-01-24 ENCOUNTER — Encounter (HOSPITAL_COMMUNITY): Payer: Self-pay | Admitting: Hematology & Oncology

## 2015-01-24 ENCOUNTER — Encounter (HOSPITAL_COMMUNITY): Payer: Medicare Other | Attending: Hematology & Oncology | Admitting: Hematology & Oncology

## 2015-01-24 ENCOUNTER — Encounter (HOSPITAL_BASED_OUTPATIENT_CLINIC_OR_DEPARTMENT_OTHER): Payer: Medicare Other

## 2015-01-24 ENCOUNTER — Other Ambulatory Visit (HOSPITAL_COMMUNITY): Payer: Medicare Other

## 2015-01-24 VITALS — BP 139/65 | HR 91 | Temp 98.2°F | Resp 20 | Wt 240.9 lb

## 2015-01-24 DIAGNOSIS — N899 Noninflammatory disorder of vagina, unspecified: Secondary | ICD-10-CM | POA: Diagnosis not present

## 2015-01-24 DIAGNOSIS — C50912 Malignant neoplasm of unspecified site of left female breast: Secondary | ICD-10-CM | POA: Diagnosis not present

## 2015-01-24 DIAGNOSIS — C50919 Malignant neoplasm of unspecified site of unspecified female breast: Secondary | ICD-10-CM

## 2015-01-24 DIAGNOSIS — E876 Hypokalemia: Secondary | ICD-10-CM | POA: Diagnosis not present

## 2015-01-24 DIAGNOSIS — Z853 Personal history of malignant neoplasm of breast: Secondary | ICD-10-CM | POA: Diagnosis not present

## 2015-01-24 DIAGNOSIS — D0512 Intraductal carcinoma in situ of left breast: Secondary | ICD-10-CM

## 2015-01-24 DIAGNOSIS — M216X9 Other acquired deformities of unspecified foot: Secondary | ICD-10-CM | POA: Diagnosis not present

## 2015-01-24 DIAGNOSIS — M79671 Pain in right foot: Secondary | ICD-10-CM | POA: Diagnosis not present

## 2015-01-24 LAB — COMPREHENSIVE METABOLIC PANEL
ALK PHOS: 57 U/L (ref 38–126)
ALT: 28 U/L (ref 14–54)
ANION GAP: 8 (ref 5–15)
AST: 29 U/L (ref 15–41)
Albumin: 4.2 g/dL (ref 3.5–5.0)
BILIRUBIN TOTAL: 0.7 mg/dL (ref 0.3–1.2)
BUN: 13 mg/dL (ref 6–20)
CO2: 31 mmol/L (ref 22–32)
Calcium: 9.7 mg/dL (ref 8.9–10.3)
Chloride: 100 mmol/L — ABNORMAL LOW (ref 101–111)
Creatinine, Ser: 0.93 mg/dL (ref 0.44–1.00)
GFR calc Af Amer: >60 mL/min (ref >60)
GFR calc non Af Amer: >60 mL/min (ref >60)
Glucose, Bld: 138 mg/dL — ABNORMAL HIGH (ref 65–99)
POTASSIUM: 4 mmol/L (ref 3.5–5.1)
SODIUM: 139 mmol/L (ref 135–145)
TOTAL PROTEIN: 7.3 g/dL (ref 6.5–8.1)

## 2015-01-24 LAB — CBC WITH DIFFERENTIAL/PLATELET
BASOS ABS: 0 10*3/uL (ref 0.0–0.1)
BASOS PCT: 1 % (ref 0–1)
Eosinophils Absolute: 0.2 10*3/uL (ref 0.0–0.7)
Eosinophils Relative: 2 % (ref 0–5)
HCT: 37.4 % (ref 36.0–46.0)
Hemoglobin: 12.3 g/dL (ref 12.0–15.0)
LYMPHS ABS: 1.9 10*3/uL (ref 0.7–4.0)
Lymphocytes Relative: 27 % (ref 12–46)
MCH: 31.1 pg (ref 26.0–34.0)
MCHC: 32.9 g/dL (ref 30.0–36.0)
MCV: 94.7 fL (ref 78.0–100.0)
Monocytes Absolute: 0.5 10*3/uL (ref 0.1–1.0)
Monocytes Relative: 7 % (ref 3–12)
Neutro Abs: 4.7 10*3/uL (ref 1.7–7.7)
Neutrophils Relative %: 63 % (ref 43–77)
Platelets: 318 10*3/uL (ref 150–400)
RBC: 3.95 MIL/uL (ref 3.87–5.11)
RDW: 13.6 % (ref 11.5–15.5)
WBC: 7.3 10*3/uL (ref 4.0–10.5)

## 2015-01-24 MED ORDER — POTASSIUM CHLORIDE CRYS ER 20 MEQ PO TBCR: 20.0000 meq | EXTENDED_RELEASE_TABLET | Freq: Two times a day (BID) | ORAL | Status: DC

## 2015-01-24 MED ORDER — ESTROGENS, CONJUGATED 0.625 MG/GM VA CREA: TOPICAL_CREAM | VAGINAL | Status: DC

## 2015-01-24 NOTE — Patient Instructions (Signed)
[place] [facility] Discharge Instructions  RECOMMENDATIONS MADE BY THE CONSULTANT AND ANY TEST RESULTS WILL BE SENT TO YOUR REFERRING PHYSICIAN.  Exam and discussion by Dr. Whitney Muse Will not make any medication changes at this time.  Will check your hormone levels when you come back in 3 months. Prescription for vaginal cream - use as directed Use Silicone based lubricant Information regarding MonaLisa given. Report any new lumps, bone pain, shortness of breath or other symptoms.  Follow-up in 3 months with labs and office visit.  Thank you for choosing [place] at [facility] to provide your oncology and hematology care.  To afford each patient quality time with our provider, please arrive at least 15 minutes before your scheduled appointment time.    You need to re-schedule your appointment should you arrive 10 or more minutes late.  We strive to give you quality time with our providers, and arriving late affects you and other patients whose appointments are after yours.  Also, if you no show three or more times for appointments you may be dismissed from the clinic at the providers discretion.     Again, thank you for choosing [facility].  Our hope is that these requests will decrease the amount of time that you wait before being seen by our physicians.       _____________________________________________________________  Should you have questions after your visit to [facility], please contact our office at [phone] between the hours of 8:30 a.m. and 4:30 p.m.  Voicemails left after 4:30 p.m. will not be returned until the following business day.  For prescription refill requests, have your pharmacy contact our office.

## 2015-01-24 NOTE — Progress Notes (Signed)
Stephanie Morel, MD [address] [place] [address]   DIAGNOSIS: Stage I (Ta N0M0) ER/PR positive, HER-2/neu unknown, (insufficiency tumor for Her-2 status) Ki-67 of 4%, status post left simple mastectomy, no evidence of disease. Intermediate Grade DCIS  Remote history of stage I (T1c N0 M0) invasive lobular carcinoma of the left breast, status post lumpectomy, sentinel node biopsy, Oncotype DX score 35 with ER PR positive, HER-2/neu negative, treated with lumpectomy, 4 cycles of Taxotere/Cytoxan ending on 02/12/2007, radiotherapy to the left breast with anastrozole 1 mg daily from 01/2007 until 01/2012. (Treated in [place])  [place] 03/09/2014 with normal bone density  CURRENT THERAPY: Arimidex 1 mg daily  INTERVAL HISTORY: Stephanie Jarvis 64 y.o. female returns for follow-up of a stage I ER positive, PR positive, HER-2 unknown breast cancer of the left breast. She was originally diagnosed with breast cancer in the left breast in 2008. She was treated with surgery, chemotherapy and radiation at that time. She states she took Arimidex for approximately 5 years.   She was then diagnosed with a second breast cancer in the left breast in 2015. She has undergone a left mastectomy. She was started back on Arimidex last year.  The patient is present alone today and says that she is overall doing well.  During her last appointment, her mother had passed.  She says that she is emotionally doing better with this. She has not had genetic testing done and says that she has no family history of breast cancer.  She notes that her sexual relationship with her spouse has become very difficult secondary to dryness and pain with intercourse.  She has tried lubricants including replens and states that nothing seems to help.  She says that her husband has been very understanding of this problem but she would like to restore the intimacy part of her life.   She is up to date on all of her  yearly testing. She denies any problems with her appetite, bowels, or chest.  The patient has no other complaints at this time.    MEDICAL HISTORY: Past Medical History  Diagnosis Date  . Arthritis   . Knee pain   . Ankle pain   . Irregular heartbeat   . High cholesterol   . Complication of anesthesia     Aspirates when having per patient  . Dysrhythmia 2011    Atrial Fibrillation  . Hepatitis     Hepatitis A when 64 years old  . GERD (gastroesophageal reflux disease)   . H/O hiatal hernia 2003  . Family history of anesthesia complication   . Anxiety   . Sciatica     has OA (osteoarthritis) of knee; Pain in joint, lower leg; Stiffness of joint, lower leg; Difficulty in walking(719.7); Swelling of joint of left knee; PTTD (posterior tibial tendon dysfunction); Metatarsal fracture; and Breast cancer on her problem list.     has No Known Allergies.  Stephanie Jarvis had no medications administered during this visit.  SURGICAL HISTORY: Past Surgical History  Procedure Laterality Date  . Tubal ligation  1980  . Hiatal hernia repair  2003  . Lymph node removal  2008  . Total knee arthroplasty  2006  . Total knee arthroplasty  08/12/2011    Procedure: TOTAL KNEE ARTHROPLASTY;  Surgeon: Arther Abbott, MD;  Location: [facility];  Service: Orthopedics;  Laterality: Left;  . Breast lumpectomy Left 1982,2008    x 2   . Mastectomy modified radical Left 01/26/2014  Procedure: LEFT MODIFIED RADICAL MASTECTOMY;  Surgeon: Jamesetta So, MD;  Location: [facility];  Service: General;  Laterality: Left;    SOCIAL HISTORY: History   Social History  . Marital Status: Widowed    Spouse Name: N/A  . Number of Children: N/A  . Years of Education: 12   Occupational History  . retired    Social History Main Topics  . Smoking status: Former Smoker -- 25 years    Quit date: 06/30/1998  . Smokeless tobacco: Never Used  . Alcohol Use: No  . Drug Use: No  . Sexual Activity: Yes    Birth  Control/ Protection: Surgical   Other Topics Concern  . Not on file   Social History Narrative    FAMILY HISTORY: Family History  Problem Relation Age of Onset  . Heart disease    . Arthritis    . Lung disease    . Cancer    . Stroke Father   . Kidney disease      Review of Systems  Constitutional: Positive for malaise/fatigue. Negative for fever, chills and weight loss.  HENT: Negative for congestion, hearing loss, nosebleeds, sore throat and tinnitus.   Eyes: Negative for blurred vision, double vision, pain and discharge.  Respiratory: Positive for shortness of breath. Negative for cough, hemoptysis, sputum production and wheezing.   Cardiovascular: Negative for chest pain, palpitations, claudication, leg swelling and PND.  Gastrointestinal: Negative for heartburn, nausea, vomiting, abdominal pain, diarrhea, constipation, blood in stool and melena.  Genitourinary: Negative for dysuria, urgency, frequency and hematuria. Positive for dyspareunia, vaginal dryness  Musculoskeletal: Positive for joint pain. Negative for myalgias and falls.  Skin: Negative for itching and rash.  Neurological: Negative for dizziness, tingling, tremors, sensory change, speech change, focal weakness, seizures, loss of consciousness, weakness and headaches.  Endo/Heme/Allergies: Does not bruise/bleed easily.  Psychiatric/Behavioral: Negative for depression, suicidal ideas, memory loss and substance abuse. The patient is not nervous/anxious and does not have insomnia.     PHYSICAL EXAMINATION  ECOG PERFORMANCE STATUS: 0 - Asymptomatic  Filed Vitals:   01/24/15 1320  BP: 139/65  Pulse: 91  Temp: 98.2 F (36.8 C)  Resp: 20    Physical Exam  Constitutional: She is oriented to person, place, and time and well-developed, well-nourished, and in no distress.  HENT:  Head: Normocephalic and atraumatic.  Nose: Nose normal.  Mouth/Throat: Oropharynx is clear and moist. No oropharyngeal exudate.    Eyes: Conjunctivae and EOM are normal. Pupils are equal, round, and reactive to light. Right eye exhibits no discharge. Left eye exhibits no discharge. No scleral icterus.  Neck: Normal range of motion. Neck supple. No tracheal deviation present. No thyromegaly present.  Cardiovascular: Normal rate, regular rhythm and normal heart sounds.  Exam reveals no gallop and no friction rub.  No murmur heard. Pulmonary/Chest: Effort normal and breath sounds normal. She has no wheezes. She has no rales.    Abdominal: Soft. Bowel sounds are normal. She exhibits no distension and no mass. There is no tenderness. There is no rebound and no guarding.  Musculoskeletal: Normal range of motion. She exhibits no edema.  Lymphadenopathy:    She has no cervical adenopathy.  Neurological: She is alert and oriented to person, place, and time. She has normal reflexes. No cranial nerve deficit. Gait normal. Coordination normal.  Skin: Skin is warm and dry. No rash noted.  Psychiatric: Mood, memory, affect and judgment normal.  Nursing note and vitals reviewed.   LABORATORY DATA:  CBC    Component Value Date/Time   WBC 7.3 01/24/2015 1311   RBC 3.95 01/24/2015 1311   HGB 12.3 01/24/2015 1311   HCT 37.4 01/24/2015 1311   PLT 318 01/24/2015 1311   MCV 94.7 01/24/2015 1311   MCH 31.1 01/24/2015 1311   MCHC 32.9 01/24/2015 1311   RDW 13.6 01/24/2015 1311   LYMPHSABS 1.9 01/24/2015 1311   MONOABS 0.5 01/24/2015 1311   EOSABS 0.2 01/24/2015 1311   BASOSABS 0.0 01/24/2015 1311   CMP     Component Value Date/Time   NA 139 01/24/2015 1311   K 4.0 01/24/2015 1311   CL 100* 01/24/2015 1311   CO2 31 01/24/2015 1311   GLUCOSE 138* 01/24/2015 1311   BUN 13 01/24/2015 1311   CREATININE 0.93 01/24/2015 1311   CALCIUM 9.7 01/24/2015 1311   PROT 7.3 01/24/2015 1311   ALBUMIN 4.2 01/24/2015 1311   AST 29 01/24/2015 1311   ALT 28 01/24/2015 1311   ALKPHOS 57 01/24/2015 1311   BILITOT 0.7 01/24/2015 1311    GFRNONAA >60 01/24/2015 1311   GFRAA >60 01/24/2015 1311     ASSESSMENT and THERAPY PLAN:   Stage I ER+, PR+ , Her-2 neu L breast cancer History of lobular carcinoma of the L breast in 2008, ER+  Pleasant 64 year old female with a history of stage I ER positive, PR positive below her carcinoma left breast. She then developed ER positive, PR positive DCIS in the left breast and underwent mastectomy. She was started back on Arimidex.  Last DEXA showed normal bone density. Last vitamin D level was normal. She is on calcium plus D. She wishes to stay on Arimidex for now.  We discussed options for her vaginal complaints. I advised her that we can use low-dose vaginal Stephanie Jarvis and discussed some of the controversy surrounding them. We could switch her to tamoxifen. She could certainly continue with Arimidex as well however. I also briefly addressed a study presented at [facility] in 2014 using DHEA. My recommendation would be to try vaginal Stephanie Jarvis daily for 2 weeks then 2-3 days weekly. We will check an estradiol level at follow-up. I gave her information on the [facility] laser, unfortunately this can be a significant cost  She is up-to-date with her mammogram.  Given her personal history of malignancy I have recommended she consider genetic testing and she is agreeable. [facility].  All questions were answered. The patient knows to call the clinic with any problems, questions or concerns. We can certainly see the patient much sooner if necessary.   This document serves as a record of services personally performed by Ancil Linsey, MD. It was created on her behalf by Janace Hoard, a trained medical scribe. The creation of this record is based on the scribe's personal observations and the provider's statements to them. This document has been checked and approved by the attending provider.  I have reviewed the above documentation for accuracy and completeness,  and I agree with the above.  This note was electronically signed.  Kelby Fam. Whitney Muse, MD

## 2015-01-24 NOTE — Progress Notes (Signed)
LABS DRAWN

## 2015-01-25 ENCOUNTER — Encounter (HOSPITAL_COMMUNITY): Payer: Self-pay | Admitting: Hematology & Oncology

## 2015-01-30 ENCOUNTER — Encounter (HOSPITAL_COMMUNITY): Payer: Medicare Other | Attending: Genetic Counselor | Admitting: Genetic Counselor

## 2015-01-30 ENCOUNTER — Encounter (HOSPITAL_COMMUNITY): Payer: Self-pay | Admitting: Genetic Counselor

## 2015-01-30 ENCOUNTER — Encounter (HOSPITAL_COMMUNITY): Payer: Medicare Other | Admitting: Genetic Counselor

## 2015-01-30 DIAGNOSIS — Z315 Encounter for genetic counseling: Secondary | ICD-10-CM | POA: Diagnosis not present

## 2015-01-30 DIAGNOSIS — Z809 Family history of malignant neoplasm, unspecified: Secondary | ICD-10-CM

## 2015-01-30 DIAGNOSIS — C50912 Malignant neoplasm of unspecified site of left female breast: Secondary | ICD-10-CM

## 2015-01-30 DIAGNOSIS — Z801 Family history of malignant neoplasm of trachea, bronchus and lung: Secondary | ICD-10-CM | POA: Diagnosis not present

## 2015-01-30 DIAGNOSIS — D0512 Intraductal carcinoma in situ of left breast: Secondary | ICD-10-CM | POA: Diagnosis not present

## 2015-01-30 DIAGNOSIS — Z808 Family history of malignant neoplasm of other organs or systems: Secondary | ICD-10-CM

## 2015-01-30 DIAGNOSIS — Z853 Personal history of malignant neoplasm of breast: Secondary | ICD-10-CM

## 2015-01-30 NOTE — Progress Notes (Signed)
REFERRING PROVIDER: Ancil Linsey, MD  PRIMARY PROVIDER:  [name], MD  PRIMARY REASON FOR VISIT:  1. Breast cancer, left   2. History of breast cancer in female   3. Family history of cancer      HISTORY OF PRESENT ILLNESS:   Stephanie Jarvis, a 64 y.o. female, was seen for a  cancer genetics consultation at the request of Dr. Whitney Muse due to a personal history of multiple primary cancers, the first of which was diagnosed at the age of 9.  Stephanie Jarvis presents to clinic today to discuss the possibility of a hereditary predisposition to cancer, genetic testing, and to further clarify her future cancer risks, as well as potential cancer risks for family members.   In 2008, at the age of 67, Stephanie Jarvis was diagnosed with invasive lobular carcinoma of the left breast.  This was reportedly ER+. This was treated with left lumpectomy.  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  Hormone receptor status was ER/PR+, Her2-, with a Ki67 of 4%.  This was treated with left mastectomy, chemotherapy, and radiation.     CANCER HISTORY:   No history exists.  1982 - lumpectomy of left breast; benign 2008 - ILC of Left breast; ER+; s/p left lumpectomy 2015 - IDC+DCIS of Left breast; ER/PR+, Her2-, Ki67 = 4%; s/p left mastectomy, chemotherapy, radiation  HORMONAL RISK FACTORS:  Menarche was at age 46.  First live birth at age - not asked.    OCP use for approximately 5 years.  Ovaries intact: yes.  Hysterectomy: no.  Menopausal status: postmenopausal.  HRT use: currently on very low dose of premarin Colonoscopy: yes; normal. Mammogram within the last year: yes. Number of breast biopsies: 3. Up to date with pelvic exams:  n/a. Any excessive radiation exposure in the past:  no  Past Medical History  Diagnosis Date  . Arthritis   . Knee pain   . Ankle pain   . Irregular heartbeat   . High cholesterol   . Complication  of anesthesia     Aspirates when having per patient  . Dysrhythmia 2011    Atrial Fibrillation  . Hepatitis     Hepatitis A when 64 years old  . GERD (gastroesophageal reflux disease)   . H/O hiatal hernia 2003  . Family history of anesthesia complication   . Anxiety   . Sciatica   . Breast cancer 2008    ILC of Left breast; ER+  . Breast cancer 2015    IDC+DCIS of Left breast; ER/PR+, Her2-, Ki67 = 4%    Past Surgical History  Procedure Laterality Date  . Tubal ligation  1980  . Hiatal hernia repair  2003  . Lymph node removal  2008  . Total knee arthroplasty  2006  . Total knee arthroplasty  08/12/2011    Procedure: TOTAL KNEE ARTHROPLASTY;  Surgeon: Arther Abbott, MD;  Location: [facility];  Service: Orthopedics;  Laterality: Left;  . Breast lumpectomy Left 1982,2008    x 2   . Mastectomy modified radical Left 01/26/2014    Procedure: LEFT MODIFIED RADICAL MASTECTOMY;  Surgeon: Jamesetta So, MD;  Location: [facility];  Service: General;  Laterality: Left;    History   Social History  . Marital Status: Widowed    Spouse Name: N/A  . Number of Children: N/A  . Years of Education: 12   Occupational History  . retired  Social History Main Topics  . Smoking status: Former Smoker -- 3.00 packs/day for 25 years    Types: Cigarettes    Quit date: 06/30/1998  . Smokeless tobacco: Never Used  . Alcohol Use: Yes     Comment: 2 glasses wine per month  . Drug Use: No  . Sexual Activity: Yes    Birth Control/ Protection: Surgical   Other Topics Concern  . None   Social History Narrative     FAMILY HISTORY:  We obtained a detailed, 4-generation family history.  Significant diagnoses are listed below: Family History  Problem Relation Age of Onset  . Heart disease    . Arthritis    . Lung disease    . Cancer    . Stroke Father   . Heart disease Father   . Kidney disease    . Skin cancer Sister     dx. 90-58; unknown type  . Throat cancer Maternal Uncle 75     smoker; voicebox removed  . Alzheimer's disease Paternal Grandmother   . Heart attack Paternal Grandfather   . Alzheimer's disease Maternal Aunt   . Heart disease Maternal Uncle   . Lung cancer Cousin     smoker  . Lung cancer Paternal Aunt     dx. mid-70s; worked at [employer]  . Heart attack Daughter     caused by thyroid issues    Stephanie Jarvis has two daughters.  One daughter passed away at the age of 38 from a heart attack induced by thyroid complications.  Her other daughter is 19 and cancer-free.  Stephanie Jarvis has five grandchildren.  She has one full sister, age 32 who has a history of skin cancer of unknown type.  This sister has two sons, ages 44 and 19, who are cancer-free.  Stephanie Jarvis's mother died at 24 from suicide and her father died at 62 from heart disease.    There is a paternal family history of lung cancer in a paternal aunt.  Stephanie Jarvis's father had two full sisters, one died at 37 (Stephanie Jarvis was unsure of the cause of death).  The second sister was diagnosed with lung cancer in her mid-25s and died at the age of 47.  This aunt was not a smoker, but did work for a tobacco company.  Stephanie Jarvis's paternal grandmother died at 72 from Alzheimer's, and her grandfather died at 35 from a heart attack.    There is a maternal family history of throat and lung cancer in an uncle and first cousin, respectively.  Stephanie Jarvis's mother had three full sisters, all of whom passed away in their mid-70s-late 53s but whom never had cancer.  She also had five full brothers.  One brother was diagnosed with throat cancer at 50, but is still living at 71.  This brother was a smoker.  One brother died at 56 of tuberculosis.  The remaining brothers died in their 23s-70s, and did not have cancer.  One maternal first cousin was a smoker and was diagnosed with lung cancer at 31.  Stephanie Jarvis's maternal grandmother died at 81.  Her maternal grandfather died at 18, and Stephanie Jarvis is unsure of the  cause of his death.  She is unaware of cancer in any additional family members.    Patient's maternal and paternal ancestors are of [place] descent. There is no reported Ashkenazi Jewish ancestry. Ms. Zambrana reports that her mother and father are related because her grandfathers on both sides  were first cousins.  Thus, her parents would be second cousins to each other.  GENETIC COUNSELING ASSESSMENT: Stephanie Jarvis is a 64 y.o. female with a personal and family history of cancer.  We, therefore, discussed and recommended the following at today's visit.   DISCUSSION: We reviewed the characteristics, features and inheritance patterns of hereditary cancer syndromes, particularly those caused by changes within the BRCA1/2 genes.  We discussed that there are certain "red flags" we may see in a family history that can raise our suspicions that the members of that family may be at an increased risk for cancer due to a genetic predisposition.  Some of these red flags included an early age of diagnosis, multiple cases of breast and other related cancers, and cancer in multiple generations of a family.  We discussed that the only red flag we see in Stephanie Jarvis's family is her multiple primary diagnoses.  Breast cancer still tends to be a common cancer in women.  Without any further family history of cancer, we can likely be reassured that Stephanie Jarvis's history of cancer is less likely due to a genetic cause.    Thus, we discussed with Stephanie Jarvis that the family history is not highly consistent with a familial hereditary cancer syndrome, and we feel she is at low risk to harbor a gene mutation associated with such a condition. We discussed that she does not meet Medicare/Tricare criteria for genetic testing based on this history, however, if she continues to be concerned with her history of breast cancer, there are other affordable testing options available which do not rely upon insurance criteria for testing, as  they are self-pay options.  Stephanie Jarvis stated that reviewing the family history made her feel better about her low chance of having a genetic predisposition for cancer.  At this time, she would not be interested in self-pay options for genetic testing.  Thus, we did not recommend any genetic testing, at this time, and recommended Ms. Bojanowski continue to follow the cancer screening guidelines given by her primary healthcare provider.  PLAN: We encouraged Ms. Frisinger to remain in contact with cancer genetics annually so that we can continuously update the family history and inform her of any changes in cancer genetics and testing that may be of benefit for her family. Ms. Gelinas's questions were answered to her satisfaction today. Our contact information was provided should additional questions or concerns arise.   Thank you for the referral and allowing [place] to share in the care of your patient.   Jeanine Luz, MS Genetic Counselor [email or web address] Phone: [phone]  The patient was seen for a total of 40 minutes in face-to-face genetic counseling.  This patient was discussed with Drs. Magrinat, Lindi Adie and/or Burr Medico who agrees with the above.    _______________________________________________________________________ For Office Staff:  Number of people involved in session: 1 Was an Intern/ student involved with case: no

## 2015-02-07 ENCOUNTER — Encounter (HOSPITAL_COMMUNITY): Payer: Self-pay

## 2015-02-10 ENCOUNTER — Encounter: Payer: Self-pay | Admitting: Neurology

## 2015-02-10 ENCOUNTER — Ambulatory Visit (INDEPENDENT_AMBULATORY_CARE_PROVIDER_SITE_OTHER): Payer: Medicare Other | Admitting: Neurology

## 2015-02-10 VITALS — BP 132/82 | HR 72 | Resp 16 | Ht 64.5 in | Wt 238.0 lb

## 2015-02-10 DIAGNOSIS — R351 Nocturia: Secondary | ICD-10-CM | POA: Diagnosis not present

## 2015-02-10 DIAGNOSIS — G4733 Obstructive sleep apnea (adult) (pediatric): Secondary | ICD-10-CM

## 2015-02-10 DIAGNOSIS — E669 Obesity, unspecified: Secondary | ICD-10-CM

## 2015-02-10 DIAGNOSIS — G2581 Restless legs syndrome: Secondary | ICD-10-CM | POA: Diagnosis not present

## 2015-02-10 DIAGNOSIS — G4719 Other hypersomnia: Secondary | ICD-10-CM | POA: Diagnosis not present

## 2015-02-10 NOTE — Patient Instructions (Signed)

## 2015-02-10 NOTE — Progress Notes (Signed)
Subjective:    Patient ID: Stephanie Jarvis is a 64 y.o. female.  HPI     Star Age, MD, PhD [facility] [address] [address] [place], [place] [address]  Dear Dr. Ethlyn Gallery,   I saw your patient, Stephanie Jarvis, upon your kind request in my neurologic clinic today for initial consultation of her sleep disorder, in particular, concern for underlying obstructive sleep apnea. The patient is unaccompanied today. As you know, Stephanie Jarvis is a 64 year old right-handed woman with an underlying medical history of hyperlipidemia, vertigo, right foot pain, severe obesity, allergies, hypertension, depression, hyperlipidemia, and reflux disease, who reports snoring and excessive daytime somnolence. Symptoms have been ongoing for 2+ plus years. She moved in with Stephanie Jarvis in 4/16. She quit smoking in 1999 and drinks alcohol occasionally and drinks caffeine in the form of coffee, 2 cups in the morning and 2 in the afternoon. She has a TV in her bedroom but does not typically watch it. She is a restless sleeper. She has woken herself up with a sense of gasping and Stephanie Jarvis has noted pauses in her breathing. She denies morning headaches but endorses nocturia, typically 2-3 times each night. She goes to bed around 10 PM and falling asleep is not a problem. She has several nighttime awakenings but is able to go back to sleep. Her rise time is usually between 7 and 8 AM. She does not set an alarm. She is retired. 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 Her mother had obstructive sleep apnea. She endorses some restless leg symptoms and is known to move a lot in her sleep. Snoring can be allowed per Stephanie Jarvis. She does not wake up well rested. Her Epworth sleepiness score is 14 out of 24 today, fatigue score is 51 out of 63 today. She has right ankle pain. She takes oxycodone as needed, not daily.  Her Past Medical History Is Significant For: Past Medical History   Diagnosis Date  . Arthritis   . Knee pain   . Ankle pain   . Irregular heartbeat   . High cholesterol   . Complication of anesthesia     Aspirates when having per patient  . Dysrhythmia 2011    Atrial Fibrillation  . Hepatitis     Hepatitis A when 64 years old  . GERD (gastroesophageal reflux disease)   . H/O hiatal hernia 2003  . Family history of anesthesia complication   . Anxiety   . Sciatica   . Breast cancer 2008    ILC of Left breast; ER+  . 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 Snoring   . Vertigo     Her Past Surgical History Is Significant For: Past Surgical History  Procedure Laterality Date  . Tubal ligation  1980  . Hiatal hernia repair  2003  . Lymph node removal  2008  . Total knee arthroplasty  2006  . Total knee arthroplasty  08/12/2011    Procedure: TOTAL KNEE ARTHROPLASTY;  Surgeon: Arther Abbott, MD;  Location: [facility];  Service: Orthopedics;  Laterality: Left;  . Breast lumpectomy Left 1982,2008    x 2   . Mastectomy modified radical Left 01/26/2014    Procedure: LEFT MODIFIED RADICAL MASTECTOMY;  Surgeon: Jamesetta So, MD;  Location: [facility];  Service: General;  Laterality: Left;    Her Family History Is Significant For: Family History  Problem Relation Age  of Onset  . Heart disease    . Arthritis    . Lung disease    . Cancer    . Stroke Father   . Heart disease Father   . Kidney disease    . Skin cancer Sister     dx. 86-58; unknown type  . Throat cancer Maternal Uncle 75    smoker; voicebox removed  . Alzheimer's disease Paternal Grandmother   . Heart attack Paternal Grandfather   . Alzheimer's disease Maternal Aunt   . Heart disease Maternal Uncle   . Lung cancer Cousin     smoker  . Lung cancer Paternal Aunt     dx. mid-70s; worked at [employer]  . Heart attack Daughter     caused by thyroid issues    Her Social History Is Significant For: Social History   Social History  .  Marital Status: Widowed    Spouse Name: N/A  . Number of Children: 2  . Years of Education: 12   Occupational History  . retired    Social History Main Topics  . Smoking status: Former Smoker -- 3.00 packs/day for 25 years    Types: Cigarettes    Quit date: 06/30/1998  . Smokeless tobacco: Never Used  . Alcohol Use: 0.0 oz/week    0 Standard drinks or equivalent per week     Comment: 2 glasses wine per month  . Drug Use: No  . Sexual Activity: Yes    Birth Control/ Protection: Surgical   Other Topics Concern  . None   Social History Narrative   Drinks 2-3 cups of coffee a day     Her Allergies Are:  No Known Allergies:   Her Current Medications Are:  Outpatient Encounter Prescriptions as of 02/10/2015  Medication Sig  . anastrozole (ARIMIDEX) 1 MG tablet Take 1 tablet (1 mg total) by mouth daily.  Marland Kitchen buPROPion (WELLBUTRIN XL) 300 MG 24 hr tablet Take 300 mg by mouth daily.  . calcium-vitamin D (OSCAL WITH D) 500-200 MG-UNIT per tablet Take 1 tablet by mouth daily with breakfast.  . conjugated estrogens (PREMARIN) vaginal cream Apply vaginally once daily for 2 weeks, then change to 3 times weekly as needed  . fexofenadine (ALLEGRA) 180 MG tablet Take 180 mg by mouth daily.  . fluticasone (FLONASE) 50 MCG/ACT nasal spray Place 2 sprays into both nostrils daily.  . furosemide (LASIX) 40 MG tablet Take 40 mg by mouth daily.  Marland Kitchen ibuprofen (ADVIL,MOTRIN) 200 MG tablet Take 400 mg by mouth every 6 (six) hours as needed for moderate pain.  . meloxicam (MOBIC) 7.5 MG tablet Take 7.5 mg by mouth daily.  . metoprolol tartrate (LOPRESSOR) 25 MG tablet Take 12.5 mg by mouth 2 (two) times daily. Half a tablet twice daily  . Multiple Vitamins-Minerals (MULTIVITAMIN PO) Take 1 tablet by mouth daily.   Marland Kitchen omeprazole (PRILOSEC) 20 MG capsule Take 20 mg by mouth 2 (two) times daily.   Marland Kitchen oxyCODONE-acetaminophen (PERCOCET/ROXICET) 5-325 MG per tablet Take 1 tablet by mouth 2 (two) times daily as  needed.  . potassium chloride SA (K-DUR,KLOR-CON) 20 MEQ tablet Take 1 tablet (20 mEq total) by mouth 2 (two) times daily.  . rosuvastatin (CRESTOR) 10 MG tablet Take 10 mg by mouth daily.  . sertraline (ZOLOFT) 100 MG tablet Take 100 mg by mouth at bedtime.   No facility-administered encounter medications on file as of 02/10/2015.  :  Review of Systems:  Out of a complete 14 point  review of systems, all are reviewed and negative with the exception of these symptoms as listed below:   Review of Systems  Constitutional: Positive for fatigue.  Neurological:       Sometimes has trouble falling asleep, no trouble falling asleep, snoring, witnessed apnea, talks in sleep, toss and turns during the night, wakes up tired in the morning, daytime tiredness, takes naps during the day.   Psychiatric/Behavioral:       Anxiety, Decreased energy    Objective:  Neurologic Exam  Physical Exam Physical Examination:   Filed Vitals:   02/10/15 0817  BP: 132/82  Pulse: 72  Resp: 16    General Examination: The patient is a very pleasant 64 y.o. female in no acute distress. She appears well-developed and well-nourished and well groomed. She is morbidly obese.   HEENT: Normocephalic, atraumatic, pupils are equal, round and reactive to light and accommodation. Funduscopic exam is normal with sharp disc margins noted. Extraocular tracking is good without limitation to gaze excursion or nystagmus noted. Normal smooth pursuit is noted. Hearing is grossly intact. Tympanic membranes are clear bilaterally. Face is symmetric with normal facial animation and normal facial sensation. Speech is clear with no dysarthria noted. There is no hypophonia. There is no lip, neck/head, jaw or voice tremor. Neck is supple with full range of passive and active motion. There are no carotid bruits on auscultation. Oropharynx exam reveals: mild mouth dryness, adequate dental hygiene and moderate airway crowding, due to narrow  airway entry and redundant soft palate, tonsils are small bilaterally. Mallampati is class II. Tongue protrudes centrally and palate elevates symmetrically. Neck size is 16.5 inches. Nasal inspection reveals no significant nasal mucosal bogginess or redness and no septal deviation.   Chest: Clear to auscultation without wheezing, rhonchi or crackles noted.  Heart: S1+S2+0, regular and normal without murmurs, rubs or gallops noted.   Abdomen: Soft, non-tender and non-distended with normal bowel sounds appreciated on auscultation.  Extremities: There is trace pitting edema in the distal lower extremities bilaterally. Pedal pulses are intact.  Skin: Warm and dry without trophic changes noted. There are no varicose veins.  Musculoskeletal: exam reveals no obvious joint deformities, tenderness or joint swelling or erythema. she reports right ankle pain. She has a elastic sleeve over the right ankle.   Neurologically:  Mental status: The patient is awake, alert and oriented in all 4 spheres. Her immediate and remote memory, attention, language skills and fund of knowledge are appropriate. There is no evidence of aphasia, agnosia, apraxia or anomia. Speech is clear with normal prosody and enunciation. Thought process is linear. Mood is normal and affect is normal.  Cranial nerves II - XII are as described above under HEENT exam. In addition: shoulder shrug is normal with equal shoulder height noted. Motor exam: Normal bulk, strength and tone is noted. There is no drift, tremor or rebound. Romberg is negative. Reflexes are 2+ throughout. Babinski: Toes are flexor bilaterally. Fine motor skills and coordination: intact with normal finger taps, normal hand movements, normal rapid alternating patting, normal foot taps and normal foot agility.  Cerebellar testing: No dysmetria or intention tremor on finger to nose testing. Heel to shin is unremarkable bilaterally. There is no truncal or gait ataxia.  Sensory  exam: intact to light touch, pinprick, vibration, temperature sense in the upper and lower extremities.  Gait, station and balance: She stands easily. No veering to one side is noted. No leaning to one side is noted. Posture is age-appropriate and stance  is narrow based. Gait shows normal stride length and normal pace. No problems turning are noted. She turns en bloc. Tandem walk is difficult for her because she has trouble putting weight on just her right foot.                Assessment and Plan:  In summary, Stephanie Jarvis is a very pleasant 19 y.o.-year old female with an underlying medical history of hyperlipidemia, vertigo, right foot pain, severe obesity, allergies, hypertension, depression, hyperlipidemia, and reflux disease, who reports snoring and excessive daytime somnolence, as well as nocturia. In addition, she has been witnessed to have pauses in her breathing and reports gasping sensations while asleep, and reports a family history of obstructive sleep apnea in her mother. Her history and physical exam are in keeping with obstructive sleep apnea (OSA).In addition, she endorses restless leg symptoms. I had a long chat with the patient and her BF about my findings and the diagnosis of OSA, its prognosis and treatment options. We talked about medical treatments, surgical interventions and non-pharmacological approaches. I explained in particular the risks and ramifications of untreated moderate to severe OSA, especially with respect to developing cardiovascular disease down the Road, including congestive heart failure, difficult to treat hypertension, cardiac arrhythmias, or stroke. Even type 2 diabetes has, in part, been linked to untreated OSA. Symptoms of untreated OSA include daytime sleepiness, memory problems, mood irritability and mood disorder such as depression and anxiety, lack of energy, as well as recurrent headaches, especially morning headaches. We talked about trying to maintain a  healthy lifestyle in general, as well as the importance of weight control. I encouraged the patient to eat healthy, exercise daily and keep well hydrated, to keep a scheduled bedtime and wake time routine, to not skip any meals and eat healthy snacks in between meals. I advised the patient not to drive when feeling sleepy. I recommended the following at this time: sleep study with potential positive airway pressure titration. (We will score hypopneas at 4% and split the sleep study into diagnostic and treatment portion, if the estimated. 2 hour AHI is >15/h). We will also be on the look out for PLMD.   I explained the sleep test procedure to the patient and also outlined possible surgical and non-surgical treatment options of OSA, including the use of a custom-made dental device (which would require a referral to a specialist dentist or oral surgeon), upper airway surgical options, such as pillar implants, radiofrequency surgery, tongue base surgery, and UPPP (which would involve a referral to an ENT surgeon). Rarely, jaw surgery such as mandibular advancement may be considered.  I also explained the CPAP treatment option to the patient, who indicated that she would be willing to try CPAP if the need arises. I explained the importance of being compliant with PAP treatment, not only for insurance purposes but primarily to improve Her symptoms, and for the patient's long term health benefit, including to reduce Her cardiovascular risks. I answered all their questions today and the patient and her BF were in agreement. I would like to see her back after the sleep study is completed and encouraged her to call with any interim questions, concerns, problems or updates.   Thank you very much for allowing me to participate in the care of this nice patient. If I can be of any further assistance to you please do not hesitate to call me at [phone].  Sincerely,   Star Age, MD, PhD

## 2015-03-09 ENCOUNTER — Ambulatory Visit (INDEPENDENT_AMBULATORY_CARE_PROVIDER_SITE_OTHER): Payer: Medicare Other | Admitting: Neurology

## 2015-03-09 DIAGNOSIS — G4730 Sleep apnea, unspecified: Secondary | ICD-10-CM

## 2015-03-09 DIAGNOSIS — G4761 Periodic limb movement disorder: Secondary | ICD-10-CM

## 2015-03-09 DIAGNOSIS — G4733 Obstructive sleep apnea (adult) (pediatric): Secondary | ICD-10-CM | POA: Diagnosis not present

## 2015-03-09 DIAGNOSIS — G4710 Hypersomnia, unspecified: Secondary | ICD-10-CM

## 2015-03-09 DIAGNOSIS — G479 Sleep disorder, unspecified: Secondary | ICD-10-CM

## 2015-03-10 NOTE — Sleep Study (Signed)
Please see the scanned sleep study interpretation located in the procedure tab in the chart view section.  

## 2015-03-12 ENCOUNTER — Other Ambulatory Visit (HOSPITAL_COMMUNITY): Payer: Self-pay | Admitting: Oncology

## 2015-03-13 ENCOUNTER — Telehealth: Payer: Self-pay | Admitting: Neurology

## 2015-03-13 DIAGNOSIS — G4761 Periodic limb movement disorder: Secondary | ICD-10-CM

## 2015-03-13 DIAGNOSIS — G4733 Obstructive sleep apnea (adult) (pediatric): Secondary | ICD-10-CM

## 2015-03-13 NOTE — Telephone Encounter (Signed)
I spoke to patient and she is aware of results and would like to proceed with second study. She is aware that the sleep lab will call to schedule. I will fax report to PCP.

## 2015-03-13 NOTE — Telephone Encounter (Addendum)
Patient referred by Dr. Ethlyn Gallery, seen by me on 02/10/15, Ins: Medicare/Tricare:  Please call and notify the patient that the recent sleep study did confirm the diagnosis of obstructive sleep apnea and that I recommend treatment for this in the form of CPAP. This will require a repeat sleep study for proper titration and mask fitting. Please explain to patient and arrange for a CPAP titration study. I have placed an order in the chart. Thanks, and please then route to [facility] for scheduling.   Star Age, MD, PhD [facility])

## 2015-03-14 ENCOUNTER — Other Ambulatory Visit (HOSPITAL_COMMUNITY): Payer: Self-pay | Admitting: Oncology

## 2015-03-14 DIAGNOSIS — C50919 Malignant neoplasm of unspecified site of unspecified female breast: Secondary | ICD-10-CM

## 2015-03-14 MED ORDER — ESTROGENS, CONJUGATED 0.625 MG/GM VA CREA: TOPICAL_CREAM | VAGINAL | Status: DC

## 2015-04-09 ENCOUNTER — Ambulatory Visit (INDEPENDENT_AMBULATORY_CARE_PROVIDER_SITE_OTHER): Payer: Medicare Other | Admitting: Neurology

## 2015-04-09 DIAGNOSIS — G4761 Periodic limb movement disorder: Secondary | ICD-10-CM

## 2015-04-09 DIAGNOSIS — G479 Sleep disorder, unspecified: Secondary | ICD-10-CM

## 2015-04-09 DIAGNOSIS — G4733 Obstructive sleep apnea (adult) (pediatric): Secondary | ICD-10-CM

## 2015-04-10 NOTE — Sleep Study (Signed)
Please see the scanned sleep study interpretation located in the Procedure tab within the Chart Review section. 

## 2015-04-13 ENCOUNTER — Telehealth: Payer: Self-pay | Admitting: Neurology

## 2015-04-13 DIAGNOSIS — G4733 Obstructive sleep apnea (adult) (pediatric): Secondary | ICD-10-CM

## 2015-04-13 DIAGNOSIS — Z6841 Body Mass Index (BMI) 40.0 and over, adult: Secondary | ICD-10-CM | POA: Diagnosis not present

## 2015-04-13 DIAGNOSIS — M1990 Unspecified osteoarthritis, unspecified site: Secondary | ICD-10-CM | POA: Diagnosis not present

## 2015-04-13 DIAGNOSIS — Z23 Encounter for immunization: Secondary | ICD-10-CM | POA: Diagnosis not present

## 2015-04-13 DIAGNOSIS — Z1389 Encounter for screening for other disorder: Secondary | ICD-10-CM | POA: Diagnosis not present

## 2015-04-13 NOTE — Telephone Encounter (Signed)
Patient referred by Dr. Ethlyn Gallery, seen by me on 02/10/15, PSG on 03/09/15, CPAP study on 04/09/15, Ins: Medicare/Tricare:  Please call and inform patient that I have entered an order for treatment with positive airway pressure (PAP) treatment of obstructive sleep apnea (OSA). She did well during the latest sleep study with CPAP. We will, therefore, arrange for a machine for home use through a DME (durable medical equipment) company of Her choice; and I will see the patient back in follow-up in about 8-10 weeks. Please also explain to the patient that I will be looking out for compliance data, which can be downloaded from the machine (stored on an SD card, that is inserted in the machine) or via remote access through a modem, that is built into the machine. At the time of the followup appointment we will discuss sleep study results and how it is going with PAP treatment at home. Please advise patient to bring Her machine at the time of the first FU visit, even though this is cumbersome. Bringing the machine for every visit after that will likely not be needed, but often helps for the first visit to troubleshoot if needed. Please re-enforce the importance of compliance with treatment and the need for [place] to monitor compliance data - often an insurance requirement and actually good feedback for the patient as far as how they are doing.  Also remind patient, that any interim PAP machine or mask issues should be first addressed with the DME company, as they can often help better with technical and mask fit issues. Please ask if patient has a preference regarding DME company.  Please also make sure, the patient has a follow-up appointment with me in about 8-10 weeks from the setup date, thanks.  Once you have spoken to the patient - and faxed/routed report to PCP and referring MD (if other than PCP), you can close this encounter, thanks,   Star Age, MD, PhD [facility] ([place])

## 2015-04-13 NOTE — Telephone Encounter (Signed)
I spoke to patient and gave results and recommendation. She is willing to proceed with treatment and would like to use [facility] in [place]. I will fax study to PCP. Patient made f/u appt. I will send letter to patient reminding her to keep appt and stress the importance of compliance.

## 2015-05-01 ENCOUNTER — Other Ambulatory Visit (HOSPITAL_COMMUNITY): Payer: Medicare Other

## 2015-05-01 ENCOUNTER — Ambulatory Visit (HOSPITAL_COMMUNITY): Payer: Medicare Other | Admitting: Hematology & Oncology

## 2015-05-05 ENCOUNTER — Encounter (HOSPITAL_BASED_OUTPATIENT_CLINIC_OR_DEPARTMENT_OTHER): Payer: Medicare Other

## 2015-05-05 ENCOUNTER — Encounter (HOSPITAL_COMMUNITY): Payer: Self-pay | Admitting: Hematology & Oncology

## 2015-05-05 ENCOUNTER — Encounter (HOSPITAL_COMMUNITY): Payer: Medicare Other | Attending: Genetic Counselor | Admitting: Hematology & Oncology

## 2015-05-05 VITALS — BP 123/66 | HR 78 | Temp 98.1°F | Resp 21 | Wt 252.2 lb

## 2015-05-05 DIAGNOSIS — C50912 Malignant neoplasm of unspecified site of left female breast: Secondary | ICD-10-CM | POA: Diagnosis not present

## 2015-05-05 DIAGNOSIS — C50919 Malignant neoplasm of unspecified site of unspecified female breast: Secondary | ICD-10-CM | POA: Diagnosis present

## 2015-05-05 DIAGNOSIS — D0512 Intraductal carcinoma in situ of left breast: Secondary | ICD-10-CM | POA: Diagnosis present

## 2015-05-05 LAB — CBC
HCT: 36.4 % (ref 36.0–46.0)
HEMOGLOBIN: 12 g/dL (ref 12.0–15.0)
MCH: 31.3 pg (ref 26.0–34.0)
MCHC: 33 g/dL (ref 30.0–36.0)
MCV: 95 fL (ref 78.0–100.0)
Platelets: 273 10*3/uL (ref 150–400)
RBC: 3.83 MIL/uL — AB (ref 3.87–5.11)
RDW: 13.7 % (ref 11.5–15.5)
WBC: 7 10*3/uL (ref 4.0–10.5)

## 2015-05-05 LAB — COMPREHENSIVE METABOLIC PANEL
ALK PHOS: 67 U/L (ref 38–126)
ALT: 27 U/L (ref 14–54)
AST: 29 U/L (ref 15–41)
Albumin: 3.8 g/dL (ref 3.5–5.0)
Anion gap: 7 (ref 5–15)
BUN: 13 mg/dL (ref 6–20)
CALCIUM: 9.3 mg/dL (ref 8.9–10.3)
CHLORIDE: 104 mmol/L (ref 101–111)
CO2: 27 mmol/L (ref 22–32)
Creatinine, Ser: 0.75 mg/dL (ref 0.44–1.00)
GFR calc Af Amer: >60 mL/min (ref >60)
GFR calc non Af Amer: >60 mL/min (ref >60)
Glucose, Bld: 118 mg/dL — ABNORMAL HIGH (ref 65–99)
Potassium: 3.9 mmol/L (ref 3.5–5.1)
SODIUM: 138 mmol/L (ref 135–145)
Total Bilirubin: 0.4 mg/dL (ref 0.3–1.2)
Total Protein: 6.8 g/dL (ref 6.5–8.1)

## 2015-05-05 NOTE — Patient Instructions (Signed)
[place] at [facility] Discharge Instructions  RECOMMENDATIONS MADE BY THE CONSULTANT AND ANY TEST RESULTS WILL BE SENT TO YOUR REFERRING PHYSICIAN.  Exam and discussion by Dr. Whitney Muse. Will get ultrasound of your mastectomy site If any concerns with your labs we will contact you. Report any new lumps, bone pain, shortness of breath or other symptoms.  Follow-up in 3 months.  Thank you for choosing [place] at [facility] to provide your oncology and hematology care.  To afford each patient quality time with our provider, please arrive at least 15 minutes before your scheduled appointment time.    You need to re-schedule your appointment should you arrive 10 or more minutes late.  We strive to give you quality time with our providers, and arriving late affects you and other patients whose appointments are after yours.  Also, if you no show three or more times for appointments you may be dismissed from the clinic at the providers discretion.     Again, thank you for choosing [facility].  Our hope is that these requests will decrease the amount of time that you wait before being seen by our physicians.       _____________________________________________________________  Should you have questions after your visit to [facility], please contact our office at [phone] between the hours of 8:30 a.m. and 4:30 p.m.  Voicemails left after 4:30 p.m. will not be returned until the following business day.  For prescription refill requests, have your pharmacy contact our office.

## 2015-05-05 NOTE — Progress Notes (Signed)
Stephanie Morel, MD [address] [place] [address]   DIAGNOSIS: Stage I (Ta N0M0) ER/PR positive, HER-2/neu unknown, (insufficiency tumor for Her-2 status) Ki-67 of 4%, status post left simple mastectomy, no evidence of disease. Intermediate Grade DCIS  Remote history of stage I (T1c N0 M0) invasive lobular carcinoma of the left breast, status post lumpectomy, sentinel node biopsy, Oncotype DX score 35 with ER PR positive, HER-2/neu negative, treated with lumpectomy, 4 cycles of Taxotere/Cytoxan ending on 02/12/2007, radiotherapy to the left breast with anastrozole 1 mg daily from 01/2007 until 01/2012. (Treated in [place])  DEXA bone density 03/09/2014; normal bone density  CURRENT THERAPY: Arimidex 1 mg daily  INTERVAL HISTORY: Stephanie Jarvis 64 y.o. female returns for follow-up of a stage I ER positive, PR positive, HER-2 unknown breast cancer of the left breast. She was originally diagnosed with breast cancer in the left breast in 2008. She was treated with surgery, chemotherapy and radiation at that time. She states she took Arimidex for approximately 5 years.   She was then diagnosed with a second breast cancer in the left breast in 2015. She has undergone a left mastectomy. She was started back on Arimidex last year.  She states that things are much better in general.  She is having no problems to report with her breast, no problems with her other breast, no problems with her chest wall, and nothing new or alarming to report.  She remarks that she is losing her primary care doctor and she is sad about this.  She denies any swelling in her legs.  She has already had her flu shot. She has also had a pneumonia shot, and shingles vaccine.  She denies nausea and vomiting, constipation, diarrhea, change in appetite or energy level.  MEDICAL HISTORY: Past Medical History  Diagnosis Date  . Arthritis   . Knee pain   . Ankle pain   . Irregular heartbeat   .  High cholesterol   . Complication of anesthesia     Aspirates when having per patient  . Dysrhythmia 2011    Atrial Fibrillation  . Hepatitis     Hepatitis A when 64 years old  . GERD (gastroesophageal reflux disease)   . H/O hiatal hernia 2003  . Family history of anesthesia complication   . Anxiety   . Sciatica   . Breast cancer ([place]) 2008    ILC of Left breast; ER+  . Breast cancer ([place]) 2015    IDC+DCIS of Left breast; ER/PR+, Her2-, Ki67 = 4%  . Snoring   . Vertigo     has OA (osteoarthritis) of knee; Pain in joint, lower leg; Stiffness of joint, lower leg; Difficulty in walking(719.7); Swelling of joint of left knee; PTTD (posterior tibial tendon dysfunction); Metatarsal fracture; and Breast cancer ([place]) on her problem list.     has No Known Allergies.  Stephanie Jarvis had no medications administered during this visit.  SURGICAL HISTORY: Past Surgical History  Procedure Laterality Date  . Tubal ligation  1980  . Hiatal hernia repair  2003  . Lymph node removal  2008  . Total knee arthroplasty  2006  . Total knee arthroplasty  08/12/2011    Procedure: TOTAL KNEE ARTHROPLASTY;  Surgeon: Arther Abbott, MD;  Location: [facility];  Service: Orthopedics;  Laterality: Left;  . Breast lumpectomy Left 1982,2008    x 2   . Mastectomy modified radical Left 01/26/2014    Procedure: LEFT MODIFIED RADICAL MASTECTOMY;  Surgeon: Jamesetta So, MD;  Location: [facility];  Service: General;  Laterality: Left;    SOCIAL HISTORY: Social History   Social History  . Marital Status: Widowed    Spouse Name: N/A  . Number of Children: 2  . Years of Education: 12   Occupational History  . retired    Social History Main Topics  . Smoking status: Former Smoker -- 3.00 packs/day for 25 years    Types: Cigarettes    Quit date: 06/30/1998  . Smokeless tobacco: Never Used  . Alcohol Use: 0.0 oz/week    0 Standard drinks or equivalent per week     Comment: 2 glasses wine per month  . Drug  Use: No  . Sexual Activity: Yes    Birth Control/ Protection: Surgical   Other Topics Concern  . Not on file   Social History Narrative   Drinks 2-3 cups of coffee a day     FAMILY HISTORY: Family History  Problem Relation Age of Onset  . Heart disease    . Arthritis    . Lung disease    . Cancer    . Stroke Father   . Heart disease Father   . Kidney disease    . Skin cancer Sister     dx. 51-58; unknown type  . Throat cancer Maternal Uncle 75    smoker; voicebox removed  . Alzheimer's disease Paternal Grandmother   . Heart attack Paternal Grandfather   . Alzheimer's disease Maternal Aunt   . Heart disease Maternal Uncle   . Lung cancer Cousin     smoker  . Lung cancer Paternal Aunt     dx. mid-70s; worked at [employer]  . Heart attack Daughter     caused by thyroid issues    Review of Systems  Constitutional: Positive for malaise/fatigue. Negative for fever, chills and weight loss.  HENT: Negative for congestion, hearing loss, nosebleeds, sore throat and tinnitus.   Eyes: Negative for blurred vision, double vision, pain and discharge.  Respiratory: Positive for shortness of breath. Negative for cough, hemoptysis, sputum production and wheezing.   Cardiovascular: Negative for chest pain, palpitations, claudication, leg swelling and PND.  Gastrointestinal: Negative for heartburn, nausea, vomiting, abdominal pain, diarrhea, constipation, blood in stool and melena.  Genitourinary: Negative for dysuria, urgency, frequency and hematuria. Positive for dyspareunia, vaginal dryness  Musculoskeletal: Positive for joint pain. Negative for myalgias and falls.  Skin: Negative for itching and rash.  Neurological: Negative for dizziness, tingling, tremors, sensory change, speech change, focal weakness, seizures, loss of consciousness, weakness and headaches.  Endo/Heme/Allergies: Does not bruise/bleed easily.  Psychiatric/Behavioral: Negative for depression, suicidal ideas,  memory loss and substance abuse. The patient is not nervous/anxious and does not have insomnia.    14 point review of systems was performed and is negative except as detailed under history of present illness and above   PHYSICAL EXAMINATION  ECOG PERFORMANCE STATUS: 0 - Asymptomatic  Filed Vitals:   05/05/15 1331  BP: 123/66  Pulse: 78  Temp: 98.1 F (36.7 C)  Resp: 21    Physical Exam  Constitutional: She is oriented to person, place, and time and well-developed, well-nourished, and in no distress.  HENT:  Head: Normocephalic and atraumatic.  Nose: Nose normal.  Mouth/Throat: Oropharynx is clear and moist. No oropharyngeal exudate.  Eyes: Conjunctivae and EOM are normal. Pupils are equal, round, and reactive to light. Right eye exhibits no discharge. Left eye exhibits no discharge. No scleral icterus.  Neck: Normal range of motion. Neck supple. No tracheal deviation present. No thyromegaly present.  Cardiovascular: Normal rate, regular rhythm and normal heart sounds.  Exam reveals no gallop and no friction rub.  No murmur heard. Pulmonary/Chest: Effort normal and breath sounds normal. She has no wheezes. She has no rales.    R nipple slightly inverted anteriorly L mastectomy site is good; no suspicious skin abnormalities  Abdominal: Soft. Bowel sounds are normal. She exhibits no distension and no mass. There is no tenderness. There is no rebound and no guarding.  Musculoskeletal: Normal range of motion. She exhibits no edema.  Lymphadenopathy:    She has no cervical adenopathy.  Neurological: She is alert and oriented to person, place, and time. She has normal reflexes. No cranial nerve deficit. Gait normal. Coordination normal.  Skin: Skin is warm and dry. No rash noted.  Psychiatric: Mood, memory, affect and judgment normal.  Nursing note and vitals reviewed.   LABORATORY DATA: I have reviewed the data as listed  CBC    Component Value Date/Time   WBC 7.0  05/05/2015 1316   RBC 3.83* 05/05/2015 1316   HGB 12.0 05/05/2015 1316   HCT 36.4 05/05/2015 1316   PLT 273 05/05/2015 1316   MCV 95.0 05/05/2015 1316   MCH 31.3 05/05/2015 1316   MCHC 33.0 05/05/2015 1316   RDW 13.7 05/05/2015 1316   LYMPHSABS 1.9 01/24/2015 1311   MONOABS 0.5 01/24/2015 1311   EOSABS 0.2 01/24/2015 1311   BASOSABS 0.0 01/24/2015 1311   CMP     Component Value Date/Time   NA 138 05/05/2015 1316   K 3.9 05/05/2015 1316   CL 104 05/05/2015 1316   CO2 27 05/05/2015 1316   GLUCOSE 118* 05/05/2015 1316   BUN 13 05/05/2015 1316   CREATININE 0.75 05/05/2015 1316   CALCIUM 9.3 05/05/2015 1316   PROT 6.8 05/05/2015 1316   ALBUMIN 3.8 05/05/2015 1316   AST 29 05/05/2015 1316   ALT 27 05/05/2015 1316   ALKPHOS 67 05/05/2015 1316   BILITOT 0.4 05/05/2015 1316   GFRNONAA >60 05/05/2015 1316   GFRAA >60 05/05/2015 1316     ASSESSMENT and THERAPY PLAN:   Stage I ER+, PR+ , Her-2 neu L breast cancer History of lobular carcinoma of the L breast in 2008, ER+  Pleasant 64 year old female with a history of stage I ER positive, PR positive below her carcinoma left breast. She then developed ER positive, PR positive DCIS in the left breast and underwent mastectomy. She was started back on Arimidex.  Last DEXA showed normal bone density. Last vitamin D level was normal. She is on calcium plus D. She wishes to stay on Arimidex for now.  Previously, we discussed options for her vaginal complaints. She was started on low-dose vaginal estrogen.  She is tolerating very well without any complaints.  She is using it 2-3 times per week.  As a result of this medication, I will check estrogen level today in the clinic, in addition to other labs.  Her bloodwork is good today. The only thing that is slightly up is her blood sugar, but it's not alarming.  Her estrogen level is also within an appropriate range.  With regards to her painful area in the left breast, I will order an  ultrasound.  Mrs. Hauschild needs a prescription for a bra prosthesis.  This is completed today. She is up to date on her DEXA; she will need another next year. She also needs a  mammogram in June.    All questions were answered. The patient knows to call the clinic with any problems, questions or concerns. We can certainly see the patient much sooner if necessary.   This document serves as a record of services personally performed by Ancil Linsey, MD. It was created on her behalf by Toni Amend, a trained medical scribe. The creation of this record is based on the scribe's personal observations and the provider's statements to them. This document has been checked and approved by the attending provider.  I have reviewed the above documentation for accuracy and completeness, and I agree with the above.  This note was electronically signed.  Kelby Fam. Whitney Muse, MD

## 2015-05-08 NOTE — Progress Notes (Signed)
LABS DRAWN

## 2015-05-09 LAB — ESTROGENS, TOTAL: Estrogen: 82.6 pg/mL

## 2015-05-11 ENCOUNTER — Ambulatory Visit (HOSPITAL_COMMUNITY): Payer: Medicare Other

## 2015-05-16 ENCOUNTER — Ambulatory Visit (HOSPITAL_COMMUNITY)
Admission: RE | Admit: 2015-05-16 | Discharge: 2015-05-16 | Disposition: A | Discharge status: Home / Self Care | Payer: Medicare Other | Source: Ambulatory Visit | Attending: Hematology & Oncology | Admitting: Hematology & Oncology

## 2015-05-16 DIAGNOSIS — C50912 Malignant neoplasm of unspecified site of left female breast: Secondary | ICD-10-CM | POA: Diagnosis not present

## 2015-05-16 DIAGNOSIS — N63 Unspecified lump in breast: Secondary | ICD-10-CM | POA: Insufficient documentation

## 2015-05-16 DIAGNOSIS — N644 Mastodynia: Secondary | ICD-10-CM | POA: Diagnosis not present

## 2015-05-16 DIAGNOSIS — Z9012 Acquired absence of left breast and nipple: Secondary | ICD-10-CM | POA: Diagnosis not present

## 2015-05-23 ENCOUNTER — Encounter (HOSPITAL_COMMUNITY): Payer: Medicare Other

## 2015-06-12 ENCOUNTER — Encounter: Payer: Self-pay | Admitting: Neurology

## 2015-06-12 ENCOUNTER — Ambulatory Visit (INDEPENDENT_AMBULATORY_CARE_PROVIDER_SITE_OTHER): Payer: Medicare Other | Admitting: Neurology

## 2015-06-12 VITALS — BP 106/70 | HR 72 | Resp 18 | Ht 64.5 in | Wt 250.0 lb

## 2015-06-12 DIAGNOSIS — G4733 Obstructive sleep apnea (adult) (pediatric): Secondary | ICD-10-CM | POA: Diagnosis not present

## 2015-06-12 DIAGNOSIS — E669 Obesity, unspecified: Secondary | ICD-10-CM

## 2015-06-12 DIAGNOSIS — G4761 Periodic limb movement disorder: Secondary | ICD-10-CM | POA: Diagnosis not present

## 2015-06-12 DIAGNOSIS — Z9989 Dependence on other enabling machines and devices: Principal | ICD-10-CM

## 2015-06-12 NOTE — Progress Notes (Signed)
Subjective:    Patient ID: Stephanie Jarvis is a 64 y.o. female.  HPI     Interim history:   Stephanie Jarvis is a 80 year old right-handed woman with an underlying medical history of hyperlipidemia, vertigo, right foot pain, severe obesity, allergies, hypertension, depression, hyperlipidemia, and reflux disease, who presents for follow-up consultation of her obstructive sleep apnea, after her recent sleep studies. The patient is accompanied by her boy friend today. I first met her on 02/10/2015 at the request of her primary care physician, at which time the patient reported snoring and excessive daytime somnolence for over 2 years. She was invited back for sleep study. She had a baseline sleep study, followed by a CPAP titration study, and I went over her test results with her in detail today. Her baseline sleep study from 03/09/2015 showed a sleep efficiency of 79.3% with a prolonged sleep latency of 56 minutes and wake after sleep onset of 43.5 minutes with mild to moderate sleep fragmentation noted. She had an elevated arousal index, primarily secondary to respiratory events. She had an increased percentage of stage II sleep, slow-wave sleep at 14.8%, and a decreased percentage of REM sleep at 7.7% with a mildly prolonged REM latency of 131 minutes. She had moderate PLMS with an index of 26.1 per hour, resulting in 8.2 arousals per hour. She had no significant EKG or EEG changes. She had mild to moderate snoring. Total AHI was 21.8 per hour, rising to 30.5 per hour during REM sleep and 35.5 per hour in the supine position, average oxygen saturation was 94%, nadir was 74% during REM sleep. Based on her test results I invited her back for a CPAP titration study. She had this on 04/09/2015. Sleep efficiency was 78.2%, latency to sleep prolonged at 55.5 minutes and wake after sleep onset was 41.5 minutes with mild to moderate sleep fragmentation noted. She had a mildly elevated arousal index, an increased  percentage of stage II sleep, absence of slow-wave sleep and a decreased percentage of REM sleep at 50% with a mildly prolonged REM latency of 142 minutes. She had severe PLMS with an index of 73.3 per hour, resulting in 7.2 arousals per hour. She had no significant EKG or EEG changes. CPAP was titrated from 5 cm to 9 cm. AHI was 1.8 per hour at the final pressure. Average oxygen saturation was 92%, nadir was 85%. Based on her test results I prescribed CPAP therapy for home use.  Today, 06/12/2015: I reviewed her CPAP compliance data from 05/09/2015 through 06/07/2015 which is a total of 30 days during which time she used her machine every night with percent used days greater than 4 hours at 100%, indicating superb compliance with an average usage of 8 hours and 50 minutes, residual AHI borderline at 4.8 per hour, leak low with the 95th percentile at 2.9 L/m and a pressure of 9 cm with EPR of 3.  Today, 06/12/2015: She reports feeling better after her CPAP therapy was started. She feels that she is better rested and has less daytime somnolence. She is still adjusting to treatment and sometimes has trouble maintaining the mask at night. Nevertheless, she is overall quite pleased with how she is doing. She has no new complaints today. She has some residual snoring, per BF, she has some leg twitches, and does not endorse significant RLS symptoms at this time. She usually sleeps on her right side. Some mornings she wakes up with nasal congestion. She had some weight gain since August,  but is trying to lose weight. She lost a couple pounds in the last 1 month. Her nocturia is much improved.  Previously:   02/10/2015: She reports snoring and excessive daytime somnolence. Symptoms have been ongoing for 2+ plus years. She moved in with [name] in 4/16. She quit smoking in 1999 and drinks alcohol occasionally and drinks caffeine in the form of coffee, 2 cups in the morning and 2 in the afternoon. She has a TV in her  bedroom but does not typically watch it. She is a restless sleeper. She has woken herself up with a sense of gasping and [name] has noted pauses in her breathing. She denies morning headaches but endorses nocturia, typically 2-3 times each night. She goes to bed around 10 PM and falling asleep is not a problem. She has several nighttime awakenings but is able to go back to sleep. Her rise time is usually between 7 and 8 AM. She does not set an alarm. She is retired. 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 Her mother had obstructive sleep apnea. She endorses some restless leg symptoms and is known to move a lot in her sleep. Snoring can be allowed per [name]. She does not wake up well rested. Her Epworth sleepiness score is 14 out of 24 today, fatigue score is 51 out of 63 today. She has right ankle pain. She takes oxycodone as needed, not daily.  Her Past Medical History Is Significant For: Past Medical History  Diagnosis Date  . Arthritis   . Knee pain   . Ankle pain   . Irregular heartbeat   . High cholesterol   . Complication of anesthesia     Aspirates when having per patient  . Dysrhythmia 2011    Atrial Fibrillation  . Hepatitis     Hepatitis A when 64 years old  . GERD (gastroesophageal reflux disease)   . H/O hiatal hernia 2003  . Family history of anesthesia complication   . Anxiety   . Sciatica   . Breast cancer ([place]) 2008    ILC of Left breast; ER+  . 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 Snoring   . Vertigo     Her Past Surgical History Is Significant For: Past Surgical History  Procedure Laterality Date  . Tubal ligation  1980  . Hiatal hernia repair  2003  . Lymph node removal  2008  . Total knee arthroplasty  2006  . Total knee arthroplasty  08/12/2011    Procedure: TOTAL KNEE ARTHROPLASTY;  Surgeon: Arther Abbott, MD;  Location: [facility];  Service: Orthopedics;  Laterality: Left;  . Breast lumpectomy  Left 1982,2008    x 2   . Mastectomy modified radical Left 01/26/2014    Procedure: LEFT MODIFIED RADICAL MASTECTOMY;  Surgeon: Jamesetta So, MD;  Location: [facility];  Service: General;  Laterality: Left;    Her Family History Is Significant For: Family History  Problem Relation Age of Onset  . Heart disease    . Arthritis    . Lung disease    . Cancer    . Stroke Father   . Heart disease Father   . Kidney disease    . Skin cancer Sister     dx. 45-58; unknown type  . Throat cancer Maternal Uncle 75    smoker; voicebox removed  . Alzheimer's disease Paternal Grandmother   . Heart attack Paternal Grandfather   .  Alzheimer's disease Maternal Aunt   . Heart disease Maternal Uncle   . Lung cancer Cousin     smoker  . Lung cancer Paternal Aunt     dx. mid-70s; worked at [employer]  . Heart attack Daughter     caused by thyroid issues    Her Social History Is Significant For: Social History   Social History  . Marital Status: Widowed    Spouse Name: N/A  . Number of Children: 2  . Years of Education: 12   Occupational History  . retired    Social History Main Topics  . Smoking status: Former Smoker -- 3.00 packs/day for 25 years    Types: Cigarettes    Quit date: 06/30/1998  . Smokeless tobacco: Never Used  . Alcohol Use: 0.0 oz/week    0 Standard drinks or equivalent per week     Comment: 2 glasses wine per month  . Drug Use: No  . Sexual Activity: Yes    Birth Control/ Protection: Surgical   Other Topics Concern  . None   Social History Narrative   Drinks 2-3 cups of coffee a day     Her Allergies Are:  No Known Allergies:   Her Current Medications Are:  Outpatient Encounter Prescriptions as of 06/12/2015  Medication Sig  . anastrozole (ARIMIDEX) 1 MG tablet TAKE 1 TABLET DAILY  . buPROPion (WELLBUTRIN XL) 300 MG 24 hr tablet Take 300 mg by mouth daily.  . calcium-vitamin D (OSCAL WITH D) 500-200 MG-UNIT per tablet Take 1 tablet by mouth daily  with breakfast.  . conjugated estrogens (PREMARIN) vaginal cream Place vaginally 3 (three) times a week.  . fexofenadine (ALLEGRA) 180 MG tablet Take 180 mg by mouth daily.  . fluticasone (FLONASE) 50 MCG/ACT nasal spray Place 2 sprays into both nostrils daily.  . furosemide (LASIX) 40 MG tablet Take 40 mg by mouth daily.  Marland Kitchen ibuprofen (ADVIL,MOTRIN) 200 MG tablet Take 400 mg by mouth every 6 (six) hours as needed for moderate pain.  . meloxicam (MOBIC) 7.5 MG tablet Take 7.5 mg by mouth daily.  . metoprolol tartrate (LOPRESSOR) 25 MG tablet Take 12.5 mg by mouth 2 (two) times daily. Half a tablet twice daily  . Multiple Vitamins-Minerals (MULTIVITAMIN PO) Take 1 tablet by mouth daily.   Marland Kitchen omeprazole (PRILOSEC) 20 MG capsule Take 20 mg by mouth 2 (two) times daily.   . potassium chloride SA (K-DUR,KLOR-CON) 20 MEQ tablet Take 1 tablet (20 mEq total) by mouth 2 (two) times daily.  . rosuvastatin (CRESTOR) 10 MG tablet Take 10 mg by mouth daily.  . sertraline (ZOLOFT) 100 MG tablet Take 100 mg by mouth at bedtime.  . [DISCONTINUED] oxyCODONE-acetaminophen (PERCOCET/ROXICET) 5-325 MG per tablet Take 1 tablet by mouth 2 (two) times daily as needed.   No facility-administered encounter medications on file as of 06/12/2015.  :  Review of Systems:  Out of a complete 14 point review of systems, all are reviewed and negative with the exception of these symptoms as listed below:   Review of Systems  Neurological:       Patient is here for initial CPAP f/u. States that she feels better since using it. Wakes up feeling rested in the morning. Having some trouble keeping the mask on at night.     Objective:  Neurologic Exam  Physical Exam Physical Examination:   Filed Vitals:   06/12/15 0928  BP: 106/70  Pulse: 72  Resp: 18    General Examination:  The patient is a very pleasant 64 y.o. female in no acute distress. She appears well-developed and well-nourished and well groomed. She is morbidly  obese. Stephanie Jarvis is in good spirits today.  HEENT: Normocephalic, atraumatic, pupils are equal, round and reactive to light and accommodation. Extraocular tracking is good without limitation to gaze excursion or nystagmus noted. Normal smooth pursuit is noted. Hearing is grossly intact. Face is symmetric with normal facial animation and normal facial sensation. Speech is clear with no dysarthria noted. There is no hypophonia. There is no lip, neck/head, jaw or voice tremor. Neck is supple with full range of passive and active motion. There are no carotid bruits on auscultation. Oropharynx exam reveals: mild mouth dryness, adequate dental hygiene and moderate airway crowding, due to narrow airway entry and redundant soft palate, tonsils are small bilaterally. Mallampati is class II. Tongue protrudes centrally and palate elevates symmetrically. Nasal inspection reveals no significant nasal mucosal bogginess or redness and no septal deviation.   Chest: Clear to auscultation without wheezing, rhonchi or crackles noted.  Heart: S1+S2+0, regular and normal without murmurs, rubs or gallops noted.   Abdomen: Soft, non-tender and non-distended with normal bowel sounds appreciated on auscultation.  Extremities: There is trace pitting edema in the distal lower extremities bilaterally. Pedal pulses are intact.  Skin: Warm and dry without trophic changes noted. There are no varicose veins.  Musculoskeletal: exam reveals no obvious joint deformities, tenderness or joint swelling or erythema. she reports right ankle pain. She has a elastic sleeve over the right ankle.   Neurologically:  Mental status: The patient is awake, alert and oriented in all 4 spheres. Her immediate and remote memory, attention, language skills and fund of knowledge are appropriate. There is no evidence of aphasia, agnosia, apraxia or anomia. Speech is clear with normal prosody and enunciation. Thought process is linear. Mood is normal and  affect is normal.  Cranial nerves II - XII are as described above under HEENT exam. In addition: shoulder shrug is normal with equal shoulder height noted. Motor exam: Normal bulk, strength and tone is noted. There is no drift, tremor or rebound. Romberg is negative. Reflexes are 2+ throughout. Babinski: Toes are flexor bilaterally. Fine motor skills and coordination: intact with normal finger taps, normal hand movements, normal rapid alternating patting, normal foot taps and normal foot agility.  Cerebellar testing: No dysmetria or intention tremor on finger to nose testing. Heel to shin is difficult to some degree d/t body habitus. There is no truncal or gait ataxia.  Sensory exam: intact to light touch in the upper and lower extremities.  Gait, station and balance: She stands easily. No veering to one side is noted. No leaning to one side is noted. Posture is age-appropriate and stance is narrow based. Gait shows normal stride length and normal pace. No problems turning are noted. She turns en bloc. Tandem walk is difficult for her, unchanged.   Assessment and Plan:  In summary, Stephanie Jarvis is a very pleasant 64 year old female with an underlying medical history of hyperlipidemia, vertigo, right foot pain, severe obesity, allergies, hypertension, depression, hyperlipidemia, and reflux disease, who presents for follow-up consultation of her obstructive sleep apnea, after her recent sleep study is from 03/09/2015 and 04/09/2015. We talked about her sleep test results in detail today. She also was advised about her compliance data and congratulated on her superb compliance. She reports improved sleep, improved sleep consolidation, and improved nocturia and sleep quality as well as less daytime somnolence.  Her physical exam is stable. In addition, she  had significant PLMS but does not currently endorse any restless leg symptoms and leg twitching overall has improved with time. In addition, this could  be a medication-induced phenomenon secondary to her sertraline. We will continue to monitor. She has some residual snoring. Since her residual AHI is borderline at 4.8 per hour, I suggested an increase of her CPAP pressure to 10 cm. She was agreeable. For her nasal congestion I suggested she try saline nasal rinses, such as the [employer]. I had a long chat with the patient and her BF about my findings and the diagnosis of OSA, its prognosis and treatment options. We again talked about the potential risks and ramifications of untreated moderate to severe OSA, in particular with respect to cardiovascular disease down the Road. She is advised to continue to be compliant with treatment and strive for weight loss. I explained the importance of being compliant with PAP treatment, not only for insurance purposes but primarily to improve Her symptoms, and for the patient's long term health benefit, including to reduce Her cardiovascular risks. at this juncture, would like to see her back in 6 months, sooner if needed.  I answered all their questions today and the patient and her BF were in agreement. I encouraged her to call with any interim questions, concerns, problems or updates.  I spent 20 minutes in total face-to-face time with the patient, more than 50% of which was spent in counseling and coordination of care, reviewing test results, reviewing medication and discussing or reviewing the diagnosis of OSA, PLMD, its prognosis and treatment options.

## 2015-06-12 NOTE — Patient Instructions (Addendum)
Please continue using your CPAP regularly. While your insurance requires that you use CPAP at least 4 hours each night on 70% of the nights, I recommend, that you not skip any nights and use it throughout the night if you can. Getting used to CPAP and staying with the treatment long term does take time and patience and discipline. Untreated obstructive sleep apnea when it is moderate to severe can have an adverse impact on cardiovascular health and raise her risk for heart disease, arrhythmias, hypertension, congestive heart failure, stroke and diabetes. Untreated obstructive sleep apnea causes sleep disruption, nonrestorative sleep, and sleep deprivation. This can have an impact on your day to day functioning and cause daytime sleepiness and impairment of cognitive function, memory loss, mood disturbance, and problems focussing. Using CPAP regularly can improve these symptoms.  Keep up the good work! I will see you back in 6 months for sleep apnea check up, and if you continue to do well on CPAP I will see you once a year thereafter.   Please look into using a salt water nasal rinse system, such as the [employer] Pot and follow the directions and my instructions. It may help your nasal congestion and help you tolerate your CPAP.    

## 2015-06-29 DIAGNOSIS — J209 Acute bronchitis, unspecified: Secondary | ICD-10-CM | POA: Diagnosis not present

## 2015-06-29 DIAGNOSIS — Z6841 Body Mass Index (BMI) 40.0 and over, adult: Secondary | ICD-10-CM | POA: Diagnosis not present

## 2015-06-29 DIAGNOSIS — Z1389 Encounter for screening for other disorder: Secondary | ICD-10-CM | POA: Diagnosis not present

## 2015-07-29 DIAGNOSIS — J209 Acute bronchitis, unspecified: Secondary | ICD-10-CM | POA: Diagnosis not present

## 2015-08-04 ENCOUNTER — Encounter (HOSPITAL_COMMUNITY): Payer: Self-pay | Admitting: Hematology & Oncology

## 2015-08-04 ENCOUNTER — Other Ambulatory Visit (HOSPITAL_COMMUNITY): Payer: Medicare Other

## 2015-08-04 ENCOUNTER — Encounter (HOSPITAL_COMMUNITY): Payer: Medicare Other | Attending: Genetic Counselor | Admitting: Hematology & Oncology

## 2015-08-04 DIAGNOSIS — C50912 Malignant neoplasm of unspecified site of left female breast: Secondary | ICD-10-CM | POA: Diagnosis not present

## 2015-08-04 DIAGNOSIS — E876 Hypokalemia: Secondary | ICD-10-CM | POA: Diagnosis not present

## 2015-08-04 DIAGNOSIS — Z79811 Long term (current) use of aromatase inhibitors: Secondary | ICD-10-CM

## 2015-08-04 DIAGNOSIS — C50919 Malignant neoplasm of unspecified site of unspecified female breast: Secondary | ICD-10-CM | POA: Insufficient documentation

## 2015-08-04 DIAGNOSIS — E559 Vitamin D deficiency, unspecified: Secondary | ICD-10-CM

## 2015-08-04 MED ORDER — POTASSIUM CHLORIDE CRYS ER 20 MEQ PO TBCR: 20.0000 meq | EXTENDED_RELEASE_TABLET | Freq: Two times a day (BID) | ORAL | Status: DC

## 2015-08-04 NOTE — Progress Notes (Signed)
Stephanie Morel, MD [address] [place] [address]   DIAGNOSIS: Stage I (Ta N0M0) ER/PR positive, HER-2/neu unknown, (insufficiency tumor for Her-2 status) Ki-67 of 4%, status post left simple mastectomy, no evidence of disease. Intermediate Grade DCIS  Remote history of stage I (T1c N0 M0) invasive lobular carcinoma of the left breast, status post lumpectomy, sentinel node biopsy, Oncotype DX score 35 with ER PR positive, HER-2/neu negative, treated with lumpectomy, 4 cycles of Taxotere/Cytoxan ending on 02/12/2007, radiotherapy to the left breast with anastrozole 1 mg daily from 01/2007 until 01/2012. (Treated in [place])  DEXA bone density 03/09/2014; normal bone density  CURRENT THERAPY: Arimidex 1 mg daily  INTERVAL HISTORY: Stephanie Jarvis 65 y.o. female returns for follow-up of a stage I ER positive, PR positive, HER-2 unknown breast cancer of the left breast. She was originally diagnosed with breast cancer in the left breast in 2008. She was treated with surgery, chemotherapy and radiation at that time. She states she took Arimidex for approximately 5 years.   She was then diagnosed with a second breast cancer in the left breast in 2015. She has undergone a left mastectomy. She was started back on Arimidex.  Stephanie Jarvis returns to the [place] alone today. She has had bronchitis, but is getting better now that she's on her second round of antibiotics, prednisone, and cough medicine. She says she got sick the day after Christmas.  Everything else, she confirms is good.  She asks about her Potassium, saying that they stopped sending it. She was unsure if she should keep staying on it, and did buy some over the counter.   Her last colonoscopy was a while ago, about five years ago in [place]. She says that everything was normal at that time, but she cannot remember what was going on that time, because it was around the same time her husband passed. She denise  any blood in her stool or any change in her bowel habits.  She confirms that her appetite is good and that her energy is okay, except for the bronchitis; the bronchitis has her feeling "bleh."   In terms of other concerns, she mentions that her daughter went to get genetic testing, and that her daughter does not have the BRCA gene, but that her daughter's doctors still want her to get a mammogram every six months and an MRI once a year. Both Stephanie Jarvis and her daughter have only had BRCA testing, and [name] didn't think she needed further genetic testing.  She confirms that she's trying to stay busy these days. She doesn't have any plans for this spring made at the moment, but she "wants to go somewhere, just as soon as the weather gets better."  She received a breast exam today during her appointment. She comments that she thinks she figured out why her breast was hurting last time; she thinks it was the bras she was wearing. She has purchased new bras and says "it sure made a difference in that." She confirms that she self-checks her chest wall.   MEDICAL HISTORY: Past Medical History  Diagnosis Date  . Arthritis   . Knee pain   . Ankle pain   . Irregular heartbeat   . High cholesterol   . Complication of anesthesia     Aspirates when having per patient  . Dysrhythmia 2011    Atrial Fibrillation  . Hepatitis     Hepatitis A when 65 years old  .  GERD (gastroesophageal reflux disease)   . H/O hiatal hernia 2003  . Family history of anesthesia complication   . Anxiety   . Sciatica   . Breast cancer ([place]) 2008    ILC of Left breast; ER+  . Breast cancer ([place]) 2015    IDC+DCIS of Left breast; ER/PR+, Her2-, Ki67 = 4%  . Snoring   . Vertigo     has OA (osteoarthritis) of knee; Pain in joint, lower leg; Stiffness of joint, lower leg; Difficulty in walking(719.7); Swelling of joint of left knee; PTTD (posterior tibial tendon dysfunction); Metatarsal fracture; and Breast cancer ([place])  on her problem list.     has No Known Allergies.  Ms. Doto had no medications administered during this visit.  SURGICAL HISTORY: Past Surgical History  Procedure Laterality Date  . Tubal ligation  1980  . Hiatal hernia repair  2003  . Lymph node removal  2008  . Total knee arthroplasty  2006  . Total knee arthroplasty  08/12/2011    Procedure: TOTAL KNEE ARTHROPLASTY;  Surgeon: Arther Abbott, MD;  Location: [facility];  Service: Orthopedics;  Laterality: Left;  . Breast lumpectomy Left 1982,2008    x 2   . Mastectomy modified radical Left 01/26/2014    Procedure: LEFT MODIFIED RADICAL MASTECTOMY;  Surgeon: Jamesetta So, MD;  Location: [facility];  Service: General;  Laterality: Left;    SOCIAL HISTORY: Social History   Social History  . Marital Status: Widowed    Spouse Name: N/A  . Number of Children: 2  . Years of Education: 12   Occupational History  . retired    Social History Main Topics  . Smoking status: Former Smoker -- 3.00 packs/day for 25 years    Types: Cigarettes    Quit date: 06/30/1998  . Smokeless tobacco: Never Used  . Alcohol Use: 0.0 oz/week    0 Standard drinks or equivalent per week     Comment: 2 glasses wine per month  . Drug Use: No  . Sexual Activity: Yes    Birth Control/ Protection: Surgical   Other Topics Concern  . Not on file   Social History Narrative   Drinks 2-3 cups of coffee a day     FAMILY HISTORY: Family History  Problem Relation Age of Onset  . Heart disease    . Arthritis    . Lung disease    . Cancer    . Stroke Father   . Heart disease Father   . Kidney disease    . Skin cancer Sister     dx. 32-58; unknown type  . Throat cancer Maternal Uncle 75    smoker; voicebox removed  . Alzheimer's disease Paternal Grandmother   . Heart attack Paternal Grandfather   . Alzheimer's disease Maternal Aunt   . Heart disease Maternal Uncle   . Lung cancer Cousin     smoker  . Lung cancer Paternal Aunt     dx. mid-70s;  worked at [employer]  . Heart attack Daughter     caused by thyroid issues    Review of Systems  Constitutional: Positive for malaise/fatigue. Negative for fever, chills and weight loss.  HENT: Negative for congestion, hearing loss, nosebleeds, sore throat and tinnitus.   Eyes: Negative for blurred vision, double vision, pain and discharge.  Respiratory: Positive for shortness of breath. Negative for cough, hemoptysis, sputum production and wheezing.   Cardiovascular: Negative for chest pain, palpitations, claudication, leg swelling and PND.  Gastrointestinal:  Negative for heartburn, nausea, vomiting, abdominal pain, diarrhea, constipation, blood in stool and melena.  Genitourinary: Negative for dysuria, urgency, frequency and hematuria. Positive for dyspareunia, vaginal dryness  Musculoskeletal: Positive for joint pain. Negative for myalgias and falls.  Skin: Negative for itching and rash.  Neurological: Negative for dizziness, tingling, tremors, sensory change, speech change, focal weakness, seizures, loss of consciousness, weakness and headaches.  Endo/Heme/Allergies: Does not bruise/bleed easily.  Psychiatric/Behavioral: Negative for depression, suicidal ideas, memory loss and substance abuse. The patient is not nervous/anxious and does not have insomnia.    14 point review of systems was performed and is negative except as detailed under history of present illness and above   PHYSICAL EXAMINATION  ECOG PERFORMANCE STATUS: 0 - Asymptomatic  There were no vitals filed for this visit.  Physical Exam  Constitutional: She is oriented to person, place, and time and well-developed, well-nourished, and in no distress.  HENT:  Head: Normocephalic and atraumatic.  Nose: Nose normal.  Mouth/Throat: Oropharynx is clear and moist. No oropharyngeal exudate.  Eyes: Conjunctivae and EOM are normal. Pupils are equal, round, and reactive to light. Right eye exhibits no discharge. Left eye  exhibits no discharge. No scleral icterus.  Neck: Normal range of motion. Neck supple. No tracheal deviation present. No thyromegaly present.  Cardiovascular: Normal rate, regular rhythm and normal heart sounds.  Exam reveals no gallop and no friction rub.  No murmur heard. Pulmonary/Chest: Effort normal and breath sounds normal. She has no wheezes. She has no rales.    R nipple slightly inverted anteriorly L mastectomy site is good; no suspicious skin abnormalities  Abdominal: Soft. Bowel sounds are normal. She exhibits no distension and no mass. There is no tenderness. There is no rebound and no guarding.  Musculoskeletal: Normal range of motion. She exhibits no edema.  Lymphadenopathy:    She has no cervical adenopathy.  Neurological: She is alert and oriented to person, place, and time. She has normal reflexes. No cranial nerve deficit. Gait normal. Coordination normal.  Skin: Skin is warm and dry. No rash noted.  Psychiatric: Mood, memory, affect and judgment normal.  Nursing note and vitals reviewed.   LABORATORY DATA: I have reviewed the data as listed  CBC    Component Value Date/Time   WBC 7.0 05/05/2015 1316   RBC 3.83* 05/05/2015 1316   HGB 12.0 05/05/2015 1316   HCT 36.4 05/05/2015 1316   PLT 273 05/05/2015 1316   MCV 95.0 05/05/2015 1316   MCH 31.3 05/05/2015 1316   MCHC 33.0 05/05/2015 1316   RDW 13.7 05/05/2015 1316   LYMPHSABS 1.9 01/24/2015 1311   MONOABS 0.5 01/24/2015 1311   EOSABS 0.2 01/24/2015 1311   BASOSABS 0.0 01/24/2015 1311   CMP     Component Value Date/Time   NA 138 05/05/2015 1316   K 3.9 05/05/2015 1316   CL 104 05/05/2015 1316   CO2 27 05/05/2015 1316   GLUCOSE 118* 05/05/2015 1316   BUN 13 05/05/2015 1316   CREATININE 0.75 05/05/2015 1316   CALCIUM 9.3 05/05/2015 1316   PROT 6.8 05/05/2015 1316   ALBUMIN 3.8 05/05/2015 1316   AST 29 05/05/2015 1316   ALT 27 05/05/2015 1316   ALKPHOS 67 05/05/2015 1316   BILITOT 0.4 05/05/2015  1316   GFRNONAA >60 05/05/2015 1316   GFRAA >60 05/05/2015 1316     ASSESSMENT and THERAPY PLAN:   Stage I ER+, PR+ , Her-2 neu L breast cancer History of lobular carcinoma of the  L breast in 2008, ER+ NORMAL DEXA 03/2014  Pleasant 65 year old female with a history of stage I ER positive, PR positive below her carcinoma left breast. She then developed ER positive, PR positive DCIS in the left breast and underwent mastectomy. She was started back on Arimidex.  Last DEXA showed normal bone density. Last vitamin D level was normal. She is on calcium plus D. She wishes to stay on Arimidex for now.  We will fax in her Potassium refills to [facility], for 90 days. She is good on her Arimidex. She does not need another mastectomy bra prescription.  Her mammogram is due in June. She is up to date on her bone density, but hasn't had a colonoscopy in about six years.  We will see her back in 4 months.  All questions were answered. The patient knows to call the clinic with any problems, questions or concerns. We can certainly see the patient much sooner if necessary.   This document serves as a record of services personally performed by Ancil Linsey, MD. It was created on her behalf by Toni Amend, a trained medical scribe. The creation of this record is based on the scribe's personal observations and the provider's statements to them. This document has been checked and approved by the attending provider.  I have reviewed the above documentation for accuracy and completeness, and I agree with the above.  This note was electronically signed.  Kelby Fam. Whitney Muse, MD

## 2015-08-04 NOTE — Patient Instructions (Addendum)
[place] at [facility] Discharge Instructions  RECOMMENDATIONS MADE BY THE CONSULTANT AND ANY TEST RESULTS WILL BE SENT TO YOUR REFERRING PHYSICIAN.     Exam and discussion by Dr Whitney Muse today Potassium refill 90 day supply [facility]  Mammogram in June Clinical Breast Exam today Bone density is up to date  Return to see the doctor in 4 months Please call the clinic if you have any questions or concerns       Thank you for choosing [place] at [facility] to provide your oncology and hematology care.  To afford each patient quality time with our provider, please arrive at least 15 minutes before your scheduled appointment time.   Beginning January 23rd 2017 lab work for the [facility] will be done in the  Main lab at [facility] on 1st floor. If you have a lab appointment with the [place] please come in thru the  Main Entrance and check in at the main information desk  You need to re-schedule your appointment should you arrive 10 or more minutes late.  We strive to give you quality time with our providers, and arriving late affects you and other patients whose appointments are after yours.  Also, if you no show three or more times for appointments you may be dismissed from the clinic at the providers discretion.     Again, thank you for choosing [facility].  Our hope is that these requests will decrease the amount of time that you wait before being seen by our physicians.       _____________________________________________________________  Should you have questions after your visit to [facility], please contact our office at [phone] between the hours of 8:30 a.m. and 4:30 p.m.  Voicemails left after 4:30 p.m. will not be returned until the following business day.  For prescription refill requests, have your pharmacy contact our office.

## 2015-09-01 DIAGNOSIS — Z6841 Body Mass Index (BMI) 40.0 and over, adult: Secondary | ICD-10-CM | POA: Diagnosis not present

## 2015-09-01 DIAGNOSIS — M5410 Radiculopathy, site unspecified: Secondary | ICD-10-CM | POA: Diagnosis not present

## 2015-09-01 DIAGNOSIS — M545 Low back pain: Secondary | ICD-10-CM | POA: Diagnosis not present

## 2015-09-01 DIAGNOSIS — Z1389 Encounter for screening for other disorder: Secondary | ICD-10-CM | POA: Diagnosis not present

## 2015-09-12 ENCOUNTER — Telehealth: Payer: Self-pay | Admitting: Neurology

## 2015-09-12 NOTE — Telephone Encounter (Signed)
I spoke to patient and she is aware that I spoke with Stanton Kidney at [facility] and she will have a RT contact her about new supplies.

## 2015-09-12 NOTE — Telephone Encounter (Signed)
Patient is calling and needs a faxed order for her CPAP supplies stating that she had an appt with Dr. Rexene Alberts in December 2016 sent to [facility].  Thanks!

## 2015-09-12 NOTE — Telephone Encounter (Signed)
I sent a message to [facility] at [facility] to check on supplies for patient.

## 2015-11-08 DIAGNOSIS — J329 Chronic sinusitis, unspecified: Secondary | ICD-10-CM | POA: Diagnosis not present

## 2015-11-08 DIAGNOSIS — R070 Pain in throat: Secondary | ICD-10-CM | POA: Diagnosis not present

## 2015-11-08 DIAGNOSIS — Z1389 Encounter for screening for other disorder: Secondary | ICD-10-CM | POA: Diagnosis not present

## 2015-11-08 DIAGNOSIS — Z6841 Body Mass Index (BMI) 40.0 and over, adult: Secondary | ICD-10-CM | POA: Diagnosis not present

## 2015-11-08 DIAGNOSIS — R062 Wheezing: Secondary | ICD-10-CM | POA: Diagnosis not present

## 2015-11-08 DIAGNOSIS — J209 Acute bronchitis, unspecified: Secondary | ICD-10-CM | POA: Diagnosis not present

## 2015-11-08 DIAGNOSIS — J343 Hypertrophy of nasal turbinates: Secondary | ICD-10-CM | POA: Diagnosis not present

## 2015-12-01 ENCOUNTER — Ambulatory Visit (HOSPITAL_COMMUNITY): Payer: Medicare Other | Admitting: Oncology

## 2015-12-01 ENCOUNTER — Encounter (HOSPITAL_COMMUNITY): Payer: Medicare Other | Attending: Oncology | Admitting: Oncology

## 2015-12-01 VITALS — BP 113/57 | HR 80 | Temp 98.3°F | Resp 20 | Wt 244.5 lb

## 2015-12-01 DIAGNOSIS — M170 Bilateral primary osteoarthritis of knee: Secondary | ICD-10-CM

## 2015-12-01 DIAGNOSIS — Z780 Asymptomatic menopausal state: Secondary | ICD-10-CM

## 2015-12-01 DIAGNOSIS — C50412 Malignant neoplasm of upper-outer quadrant of left female breast: Secondary | ICD-10-CM | POA: Diagnosis not present

## 2015-12-01 DIAGNOSIS — Z79811 Long term (current) use of aromatase inhibitors: Secondary | ICD-10-CM | POA: Diagnosis not present

## 2015-12-01 DIAGNOSIS — Z0000 Encounter for general adult medical examination without abnormal findings: Secondary | ICD-10-CM

## 2015-12-01 DIAGNOSIS — Z853 Personal history of malignant neoplasm of breast: Secondary | ICD-10-CM | POA: Diagnosis not present

## 2015-12-01 NOTE — Patient Instructions (Signed)
[place] at [facility] Discharge Instructions  RECOMMENDATIONS MADE BY THE CONSULTANT AND ANY TEST RESULTS WILL BE SENT TO YOUR REFERRING PHYSICIAN.  Mammogram 12/04/15  Bone Density September 2017  Labs in 4 months with Dr. Whitney Muse appt  No labs today  Labs by Dr. Gerarda Fraction as directed in the next 1-2 months. Ask them to fax them to [place] [phone] (f)  Call Dr. Gerarda Fraction if dizziness continues     Thank you for choosing [place] at [facility] to provide your oncology and hematology care.  To afford each patient quality time with our provider, please arrive at least 15 minutes before your scheduled appointment time.   Beginning January 23rd 2017 lab work for the [facility] will be done in the  Main lab at [facility] on 1st floor. If you have a lab appointment with the [facility] please come in thru the  Main Entrance and check in at the main information desk  You need to re-schedule your appointment should you arrive 10 or more minutes late.  We strive to give you quality time with our providers, and arriving late affects you and other patients whose appointments are after yours.  Also, if you no show three or more times for appointments you may be dismissed from the clinic at the providers discretion.     Again, thank you for choosing [facility].  Our hope is that these requests will decrease the amount of time that you wait before being seen by our physicians.       _____________________________________________________________  Should you have questions after your visit to [facility], please contact our office at [phone] between the hours of 8:30 a.m. and 4:30 p.m.  Voicemails left after 4:30 p.m. will not be returned until the following business day.  For prescription refill requests, have your pharmacy contact our office.         Resources For Cancer Patients and their Caregivers ? American  Cancer Society: Can assist with transportation, wigs, general needs, runs Look Good Feel Better.        (548)057-9331 ? Cancer Care: Provides financial assistance, online support groups, medication/co-pay assistance.  1-800-813-HOPE (385)663-6615) ? [place] Assists [place] cancer patients and their families through emotional , educational and financial support.  (732)310-4828 ? [place] DSS Where to apply for food stamps, Medicaid and utility assistance. (715)262-6869 ? [facility]: Transportation to medical appointments. (513)096-8755 ? Social Security Administration: May apply for disability if have a Stage IV cancer. 519-421-1789 3133413096 ? [employer], Disability and Transit Services: Assists with nutrition, care and transit needs. [place] Support Programs: @10RELATIVEDAYS @ > Cancer Support Group  2nd Tuesday of the month 1pm-2pm, Journey Room  > Creative Journey  3rd Tuesday of the month 1130am-1pm, Journey Room  > Look Good Feel Better  1st Wednesday of the month 10am-12 noon, Journey Room (Call [place] to register [phone])

## 2015-12-01 NOTE — Progress Notes (Signed)
Stephanie Jarvis, [place] [address]  Malignant neoplasm of upper-outer quadrant of left female breast ([place]) - Plan: CBC with Differential, Comprehensive metabolic panel, Vitamin D 25 hydroxy, DG Bone Density  Preventative health care - Plan: Vitamin D 25 hydroxy, DG Bone Density  Osteoarthritis of both knees, unspecified osteoarthritis type - Plan: Vitamin D 25 hydroxy, DG Bone Density  Post-menopausal - Plan: DG Bone Density  CURRENT THERAPY: Arimidex daily.  INTERVAL HISTORY: Stephanie Jarvis 65 y.o. female returns for followup of Stage I left breast cancer (TisN0M0) ER/PR positive, HER-2/neu unknown (insufficiency tumor for Her-2 status), Ki-67 of 4%, status post left simple mastectomy by Dr. Arnoldo Morale on 01/26/2014, no evidence of disease. Intermediate Grade DCIS.  She is now on anti-estrogen therapy with Arimidex. AND Remote history of stage I (T1c N0 M0) invasive lobular carcinoma of the left breast, status post lumpectomy, sentinel node biopsy, Oncotype DX score 35 with ER PR positive, HER-2/neu negative, treated with lumpectomy, 4 cycles of Taxotere/Cytoxan ending on 02/12/2007, radiotherapy to the left breast with anastrozole 1 mg daily from 01/2007 until 01/2012. (Treated in [place]).    Breast cancer ([place])   12/08/2013 Procedure Left needle core biopsy   12/09/2013 Pathology Results Invasive ductal carcinoma with DCIS, ER 100%, PR 31%, Ki-67 marker 4%.  Insufficient material for HER2 testing.   12/28/2013 Breast MRI Post biopsy changes located within the left breast laterally (upper-outer quadrant) related to the patient's recent stereotactic biopsy. Also, postsurgical scarring changes within the left breast. No areas of worrisome enhancement within the right breast.   01/26/2014 Definitive Surgery Left modified radical mastectomy by Dr. Arnoldo Morale   02/01/2014 Pathology Results Intermediate grade DCIS, 0.5 cm, 0/6 lymph nodes, negative resection  margins.    I personally reviewed and went over laboratory results with the patient.  The results are noted within this dictation.  I personally reviewed and went over radiographic studies with the patient.  The results are noted within this dictation.  She is due for mammogram this month and this is scheduled for 12/04/2015.  Bone density was last performed on 03/09/2014.  She notes that her cousin is presently admitted to [facility] with Stage IV lung cancer.  He is stable today, but there were concerns yesterday about him surviving the day/night.  She is tolerating AI therapy well without any complaints at this time.  She is very reliable.  She notes that her BP is below baseline today, but is very good as noted below.  She is asymptomatic and denies any current signs/symptoms of hypotension.  She relays a single incidence while shopping where she became dizzy and light headed.  She is on antihypertensives.  She denies any recurrence of above mentioned symptoms.  Review of Systems  Constitutional: Negative.   HENT: Negative.   Eyes: Negative.   Respiratory: Negative.   Cardiovascular: Negative.   Gastrointestinal: Negative.   Genitourinary: Negative.   Musculoskeletal: Negative.   Skin: Negative.   Neurological: Negative.   Endo/Heme/Allergies: Negative.   Psychiatric/Behavioral: Negative.     Past Medical History  Diagnosis Date  . Arthritis   . Knee pain   . Ankle pain   . Irregular heartbeat   . High cholesterol   . Complication of anesthesia     Aspirates when having per patient  . Dysrhythmia 2011    Atrial Fibrillation  . Hepatitis     Hepatitis A when 65 years old  . GERD (  gastroesophageal reflux disease)   . H/O hiatal hernia 2003  . Family history of anesthesia complication   . Anxiety   . Sciatica   . Breast cancer ([place]) 2008    ILC of Left breast; ER+  . Breast cancer ([place]) 2015    IDC+DCIS of Left breast; ER/PR+, Her2-, Ki67 = 4%  . Snoring   . Vertigo      Past Surgical History  Procedure Laterality Date  . Tubal ligation  1980  . Hiatal hernia repair  2003  . Lymph node removal  2008  . Total knee arthroplasty  2006  . Total knee arthroplasty  08/12/2011    Procedure: TOTAL KNEE ARTHROPLASTY;  Surgeon: Arther Abbott, MD;  Location: [facility];  Service: Orthopedics;  Laterality: Left;  . Breast lumpectomy Left 1982,2008    x 2   . Mastectomy modified radical Left 01/26/2014    Procedure: LEFT MODIFIED RADICAL MASTECTOMY;  Surgeon: Jamesetta So, MD;  Location: [facility];  Service: General;  Laterality: Left;    Family History  Problem Relation Age of Onset  . Heart disease    . Arthritis    . Lung disease    . Cancer    . Stroke Father   . Heart disease Father   . Kidney disease    . Skin cancer Sister     dx. 63-58; unknown type  . Throat cancer Maternal Uncle 75    smoker; voicebox removed  . Alzheimer's disease Paternal Grandmother   . Heart attack Paternal Grandfather   . Alzheimer's disease Maternal Aunt   . Heart disease Maternal Uncle   . Lung cancer Cousin     smoker  . Lung cancer Paternal Aunt     dx. mid-70s; worked at [employer]  . Heart attack Daughter     caused by thyroid issues    Social History   Social History  . Marital Status: Widowed    Spouse Name: N/A  . Number of Children: 2  . Years of Education: 12   Occupational History  . retired    Social History Main Topics  . Smoking status: Former Smoker -- 3.00 packs/day for 25 years    Types: Cigarettes    Quit date: 06/30/1998  . Smokeless tobacco: Never Used  . Alcohol Use: 0.0 oz/week    0 Standard drinks or equivalent per week     Comment: 2 glasses wine per month  . Drug Use: No  . Sexual Activity: Yes    Birth Control/ Protection: Surgical   Other Topics Concern  . Not on file   Social History Narrative   Drinks 2-3 cups of coffee a day      PHYSICAL EXAMINATION  ECOG PERFORMANCE STATUS: 0 - Asymptomatic  Filed  Vitals:   12/01/15 0943  BP: 113/57  Pulse: 80  Temp: 98.3 F (36.8 C)  Resp: 20    GENERAL:alert, no distress, well nourished, well developed, comfortable, cooperative, obese, smiling and unaccompanied SKIN: skin color, texture, turgor are normal, no rashes or significant lesions HEAD: Normocephalic, No masses, lesions, tenderness or abnormalities EYES: normal, EOMI, Conjunctiva are pink and non-injected EARS: External ears normal OROPHARYNX:lips, buccal mucosa, and tongue normal and mucous membranes are moist  NECK: supple, no adenopathy, thyroid normal size, non-tender, without nodularity, no stridor, non-tender, trachea midline LYMPH:  no palpable lymphadenopathy, no hepatosplenomegaly BREAST:deferred due to mammogram in 3 days. LUNGS: clear to auscultation and percussion HEART: regular rate & rhythm, no murmurs,  no gallops, S1 normal and S2 normal ABDOMEN:abdomen soft, non-tender, obese, normal bowel sounds and no masses or organomegaly BACK: Back symmetric, no curvature., No CVA tenderness EXTREMITIES:less then 2 second capillary refill, no joint deformities, effusion, or inflammation, no edema, no skin discoloration, no clubbing, no cyanosis  NEURO: alert & oriented x 3 with fluent speech, no focal motor/sensory deficits, gait normal   LABORATORY DATA: CBC    Component Value Date/Time   WBC 7.0 05/05/2015 1316   RBC 3.83* 05/05/2015 1316   HGB 12.0 05/05/2015 1316   HCT 36.4 05/05/2015 1316   PLT 273 05/05/2015 1316   MCV 95.0 05/05/2015 1316   MCH 31.3 05/05/2015 1316   MCHC 33.0 05/05/2015 1316   RDW 13.7 05/05/2015 1316   LYMPHSABS 1.9 01/24/2015 1311   MONOABS 0.5 01/24/2015 1311   EOSABS 0.2 01/24/2015 1311   BASOSABS 0.0 01/24/2015 1311      Chemistry      Component Value Date/Time   NA 138 05/05/2015 1316   K 3.9 05/05/2015 1316   CL 104 05/05/2015 1316   CO2 27 05/05/2015 1316   BUN 13 05/05/2015 1316   CREATININE 0.75 05/05/2015 1316       Component Value Date/Time   CALCIUM 9.3 05/05/2015 1316   ALKPHOS 67 05/05/2015 1316   AST 29 05/05/2015 1316   ALT 27 05/05/2015 1316   BILITOT 0.4 05/05/2015 1316        PENDING LABS:   RADIOGRAPHIC STUDIES:  No results found.   PATHOLOGY:    ASSESSMENT AND PLAN:  Breast cancer ([place]) Stage I left breast cancer (TisN0M0) ER/PR positive, HER-2/neu unknown (insufficiency tumor for Her-2 status), Ki-67 of 4%, status post left simple mastectomy by Dr. Arnoldo Morale on 01/26/2014, no evidence of disease. Intermediate Grade DCIS.  She is now on anti-estrogen therapy with Arimidex. AND Remote history of stage I (T1c N0 M0) invasive lobular carcinoma of the left breast, status post lumpectomy, sentinel node biopsy, Oncotype DX score 35 with ER PR positive, HER-2/neu negative, treated with lumpectomy, 4 cycles of Taxotere/Cytoxan ending on 02/12/2007, radiotherapy to the left breast with anastrozole 1 mg daily from 01/2007 until 01/2012. (Treated in [place]).  Oncology history developed.  Staging in [facility] problem list is completed.  Mammogram is scheduled for 12/04/2015.  She will be due for bone density exam in Sept 2017.  Order is placed and communicated to scheduler.  Labs in 4 months: CBC diff, CMET, Vit D.  She is currently on Ca++ and Vit D supplementation. She is encouraged to continue with this.  She notes that her BP is below baseline for her.  Her BP today is excellent.  She is asymptomatic from a hypotensive standpoint.  She notes a single incidence while out shopping when she got dizzy and lightheaded.  No recurrence since.  If this recurs, then I have recommended that she follow-up with her primary care provider.  Otherwise, I recommend she just monitor for recurrence.  She is on antihypertensive medication.  No changes made today.  She notes that she is due for labs in July with Dr. Gerarda Fraction.  I have requested a copy of these labs as we may be able to cancel our planned labs this  coming fall based upon results.  Return in 4 months for follow-up.    ORDERS PLACED FOR THIS ENCOUNTER: Orders Placed This Encounter  Procedures  . DG Bone Density  . CBC with Differential  . Comprehensive metabolic panel  . Vitamin D  25 hydroxy    MEDICATIONS PRESCRIBED THIS ENCOUNTER: No orders of the defined types were placed in this encounter.    THERAPY PLAN:  NCCN guidelines recommends the following surveillance for invasive breast cancer (2.2017):  A. History and Physical exam 1-4 times per year as clinically appropriate for 5 years, then annually.  B. Periodic screening for changes in family history and referral to genetics counseling as indicated  C. Educate, monitor, and refer to lymphedema management.  D. Mammography every 12 months  E. Routine imaging of reconstructed breast is not indicated.  F. In the absence of clinical signs and symptoms suggestive of recurrent disease, there is no indication for laboratory or imaging studies for metastases screening.  G. Women on Tamoxifen: annual gynecologic assessment every 12 months if uterus is present.  H. Women on aromatase inhibitor or who experience ovarian failure secondary to treatment should have monitoring of bone health with a bone mineral density determination at baseline and periodically thereafter.  I. Assess and encourage adherence to adjuvant endocrine therapy.  J. Evidence suggests that active lifestyle, healthy diet, limited alcohol intake, and achieving and maintaining an ideal body weight (20-25 BMI) may lead to optimal breast cancer outcomes.   All questions were answered. The patient knows to call the clinic with any problems, questions or concerns. We can certainly see the patient much sooner if necessary.  Patient and plan discussed with Dr. Ancil Linsey and she is in agreement with the aforementioned.   This note is electronically signed by: Doy Mince 12/01/2015 10:26 AM

## 2015-12-01 NOTE — Assessment & Plan Note (Addendum)
Stage I left breast cancer (TisN0M0) ER/PR positive, HER-2/neu unknown (insufficiency tumor for Her-2 status), Ki-67 of 4%, status post left simple mastectomy by Dr. Arnoldo Morale on 01/26/2014, no evidence of disease. Intermediate Grade DCIS.  She is now on anti-estrogen therapy with Arimidex. AND Remote history of stage I (T1c N0 M0) invasive lobular carcinoma of the left breast, status post lumpectomy, sentinel node biopsy, Oncotype DX score 35 with ER PR positive, HER-2/neu negative, treated with lumpectomy, 4 cycles of Taxotere/Cytoxan ending on 02/12/2007, radiotherapy to the left breast with anastrozole 1 mg daily from 01/2007 until 01/2012. (Treated in [place]).  Oncology history developed.  Staging in [facility] problem list is completed.  Mammogram is scheduled for 12/04/2015.  She will be due for bone density exam in Sept 2017.  Order is placed and communicated to scheduler.  Labs in 4 months: CBC diff, CMET, Vit D.  She is currently on Ca++ and Vit D supplementation. She is encouraged to continue with this.  She notes that her BP is below baseline for her.  Her BP today is excellent.  She is asymptomatic from a hypotensive standpoint.  She notes a single incidence while out shopping when she got dizzy and lightheaded.  No recurrence since.  If this recurs, then I have recommended that she follow-up with her primary care provider.  Otherwise, I recommend she just monitor for recurrence.  She is on antihypertensive medication.  No changes made today.  She notes that she is due for labs in July with Dr. Gerarda Fraction.  I have requested a copy of these labs as we may be able to cancel our planned labs this coming fall based upon results.  Return in 4 months for follow-up.

## 2015-12-04 ENCOUNTER — Other Ambulatory Visit (HOSPITAL_COMMUNITY): Payer: Self-pay | Admitting: Hematology & Oncology

## 2015-12-04 ENCOUNTER — Ambulatory Visit (HOSPITAL_COMMUNITY): Payer: Medicare Other

## 2015-12-04 ENCOUNTER — Ambulatory Visit (HOSPITAL_COMMUNITY)
Admission: RE | Admit: 2015-12-04 | Discharge: 2015-12-04 | Disposition: A | Discharge status: Home / Self Care | Payer: Medicare Other | Source: Ambulatory Visit | Attending: Hematology & Oncology | Admitting: Hematology & Oncology

## 2015-12-04 DIAGNOSIS — Z1231 Encounter for screening mammogram for malignant neoplasm of breast: Secondary | ICD-10-CM

## 2015-12-08 ENCOUNTER — Other Ambulatory Visit (HOSPITAL_COMMUNITY): Payer: Self-pay | Admitting: Oncology

## 2015-12-10 ENCOUNTER — Other Ambulatory Visit (HOSPITAL_COMMUNITY): Payer: Self-pay | Admitting: Oncology

## 2015-12-11 ENCOUNTER — Encounter: Payer: Self-pay | Admitting: Neurology

## 2015-12-11 ENCOUNTER — Ambulatory Visit (INDEPENDENT_AMBULATORY_CARE_PROVIDER_SITE_OTHER): Payer: Medicare Other | Admitting: Neurology

## 2015-12-11 VITALS — BP 142/80 | HR 78 | Resp 18 | Ht 64.5 in | Wt 247.0 lb

## 2015-12-11 DIAGNOSIS — E669 Obesity, unspecified: Secondary | ICD-10-CM | POA: Diagnosis not present

## 2015-12-11 DIAGNOSIS — Z9989 Dependence on other enabling machines and devices: Principal | ICD-10-CM

## 2015-12-11 DIAGNOSIS — G4733 Obstructive sleep apnea (adult) (pediatric): Secondary | ICD-10-CM | POA: Diagnosis not present

## 2015-12-11 NOTE — Progress Notes (Signed)
Subjective:    Patient ID: Stephanie Jarvis is a 65 y.o. female.  HPI     Interim history:  Stephanie Jarvis is a 26 year old right-handed woman with an underlying medical history of hyperlipidemia, vertigo, right foot pain, severe obesity, allergies, hypertension, depression, hyperlipidemia, and reflux disease, who presents for follow-up consultation of her obstructive sleep apnea, on treatment with home CPAP. The patient is unaccompanied today. I last saw her on 06/12/2015, at which time she reported feeling better after CPAP therapy was initiated. She reported feeling more rested and less daytime somnolence. She had adjusted fairly well. Overall, she was quite pleased with how she was doing.I did increase her CPAP pressure to 10 cm last time because of a borderline residual AHI at the time.  Today, 12/11/2015: I reviewed her CPAP compliance data from 11/07/2015 through 12/06/2015 which is a total of 30 days during which time she used her machine 28 days with percent used days greater than 4 hours at 90%, indicating excellent compliance with an average usage of 7 hours and 53 minutes, residual AHI 2.6 per hour, leak low with the 95th percentile at 4.8 L/m on a pressure of 10 cm with EPR of 3.  Today, 12/11/2015: She reports that the pressure does not bother her. Her mask is sometimes uncomfortable. The first one she used was fine but this one, even though it looks the same does not feel quite right. She could not tolerate nasal pillows which we tried in the beginning. She was then switched to a nasal mask. Weight has been fluctuating. She does not exercise very much she admits that she has started drinking more water. She is overall doing well and sleeps well with CPAP.   Previously:   I first met her on 02/10/2015 at the request of her primary care physician, at which time the patient reported snoring and excessive daytime somnolence for over 2 years. She was invited back for sleep study. She had a  baseline sleep study, followed by a CPAP titration study, and I went over her test results with her in detail today. Her baseline sleep study from 03/09/2015 showed a sleep efficiency of 79.3% with a prolonged sleep latency of 56 minutes and wake after sleep onset of 43.5 minutes with mild to moderate sleep fragmentation noted. She had an elevated arousal index, primarily secondary to respiratory events. She had an increased percentage of stage II sleep, slow-wave sleep at 14.8%, and a decreased percentage of REM sleep at 7.7% with a mildly prolonged REM latency of 131 minutes. She had moderate PLMS with an index of 26.1 per hour, resulting in 8.2 arousals per hour. She had no significant EKG or EEG changes. She had mild to moderate snoring. Total AHI was 21.8 per hour, rising to 30.5 per hour during REM sleep and 35.5 per hour in the supine position, average oxygen saturation was 94%, nadir was 74% during REM sleep. Based on her test results I invited her back for a CPAP titration study. She had this on 04/09/2015. Sleep efficiency was 78.2%, latency to sleep prolonged at 55.5 minutes and wake after sleep onset was 41.5 minutes with mild to moderate sleep fragmentation noted. She had a mildly elevated arousal index, an increased percentage of stage II sleep, absence of slow-wave sleep and a decreased percentage of REM sleep at 50% with a mildly prolonged REM latency of 142 minutes. She had severe PLMS with an index of 73.3 per hour, resulting in 7.2 arousals per hour. She  had no significant EKG or EEG changes. CPAP was titrated from 5 cm to 9 cm. AHI was 1.8 per hour at the final pressure. Average oxygen saturation was 92%, nadir was 85%. Based on her test results I prescribed CPAP therapy for home use.  I reviewed her CPAP compliance data from 05/09/2015 through 06/07/2015 which is a total of 30 days during which time she used her machine every night with percent used days greater than 4 hours at 100%,  indicating superb compliance with an average usage of 8 hours and 50 minutes, residual AHI borderline at 4.8 per hour, leak low with the 95th percentile at 2.9 L/m and a pressure of 9 cm with EPR of 3.  02/10/2015: She reports snoring and excessive daytime somnolence. Symptoms have been ongoing for 2+ plus years. She moved in with Stephanie Jarvis in 4/16. She quit smoking in 1999 and drinks alcohol occasionally and drinks caffeine in the form of coffee, 2 cups in the morning and 2 in the afternoon. She has a TV in her bedroom but does not typically watch it. She is a restless sleeper. She has woken herself up with a sense of gasping and Stephanie Jarvis has noted pauses in her breathing. She denies morning headaches but endorses nocturia, typically 2-3 times each night. She goes to bed around 10 PM and falling asleep is not a problem. She has several nighttime awakenings but is able to go back to sleep. Her rise time is usually between 7 and 8 AM. She does not set an alarm. She is retired. 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 Her mother had obstructive sleep apnea. She endorses some restless leg symptoms and is known to move a lot in her sleep. Snoring can be allowed per Stephanie Jarvis. She does not wake up well rested. Her Epworth sleepiness score is 14 out of 24 today, fatigue score is 51 out of 63 today. She has right ankle pain. She takes oxycodone as needed, not daily.  Her Past Medical History Is Significant For: Past Medical History  Diagnosis Date  . Arthritis   . Knee pain   . Ankle pain   . Irregular heartbeat   . High cholesterol   . Complication of anesthesia     Aspirates when having per patient  . Dysrhythmia 2011    Atrial Fibrillation  . Hepatitis     Hepatitis A when 65 years old  . GERD (gastroesophageal reflux disease)   . H/O hiatal hernia 2003  . Family history of anesthesia complication   . Anxiety   . Sciatica   . Breast cancer ([place]) 2008    ILC of Left breast; ER+  . 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 Snoring   . Vertigo     Her Past Surgical History Is Significant For: Past Surgical History  Procedure Laterality Date  . Tubal ligation  1980  . Hiatal hernia repair  2003  . Lymph node removal  2008  . Total knee arthroplasty  2006  . Total knee arthroplasty  08/12/2011    Procedure: TOTAL KNEE ARTHROPLASTY;  Surgeon: Arther Abbott, MD;  Location: [facility];  Service: Orthopedics;  Laterality: Left;  . Breast lumpectomy Left 1982,2008    x 2   . Mastectomy modified radical Left 01/26/2014    Procedure: LEFT MODIFIED RADICAL MASTECTOMY;  Surgeon: Jamesetta So, MD;  Location: [facility];  Service: General;  Laterality: Left;    Her Family History Is Significant For: Family History  Problem Relation Age of Onset  . Heart disease    . Arthritis    . Lung disease    . Cancer    . Stroke Father   . Heart disease Father   . Kidney disease    . Skin cancer Sister     dx. 34-58; unknown type  . Throat cancer Maternal Uncle 75    smoker; voicebox removed  . Alzheimer's disease Paternal Grandmother   . Heart attack Paternal Grandfather   . Alzheimer's disease Maternal Aunt   . Heart disease Maternal Uncle   . Lung cancer Cousin     smoker  . Lung cancer Paternal Aunt     dx. mid-70s; worked at [employer]  . Heart attack Daughter     caused by thyroid issues    Her Social History Is Significant For: Social History   Social History  . Marital Status: Widowed    Spouse Name: N/A  . Number of Children: 2  . Years of Education: 12   Occupational History  . retired    Social History Main Topics  . Smoking status: Former Smoker -- 3.00 packs/day for 25 years    Types: Cigarettes    Quit date: 06/30/1998  . Smokeless tobacco: Never Used  . Alcohol Use: 0.0 oz/week    0 Standard drinks or equivalent per week     Comment: 2 glasses wine per month  . Drug Use: No  . Sexual Activity: Yes     Birth Control/ Protection: Surgical   Other Topics Concern  . None   Social History Narrative   Drinks 2-3 cups of coffee a day     Her Allergies Are:  No Known Allergies:   Her Current Medications Are:  Outpatient Encounter Prescriptions as of 12/11/2015  Medication Sig  . anastrozole (ARIMIDEX) 1 MG tablet TAKE 1 TABLET DAILY  . buPROPion (WELLBUTRIN XL) 300 MG 24 hr tablet Take 300 mg by mouth daily.  . calcium-vitamin D (OSCAL WITH D) 500-200 MG-UNIT per tablet Take 1 tablet by mouth daily with breakfast.  . conjugated estrogens (PREMARIN) vaginal cream Place vaginally 3 (three) times a week.  . fexofenadine (ALLEGRA) 180 MG tablet Take 180 mg by mouth daily.  . furosemide (LASIX) 40 MG tablet Take 40 mg by mouth daily.  Marland Kitchen ibuprofen (ADVIL,MOTRIN) 200 MG tablet Take 400 mg by mouth every 6 (six) hours as needed for moderate pain.  . meloxicam (MOBIC) 7.5 MG tablet Take 7.5 mg by mouth daily.  . metoprolol tartrate (LOPRESSOR) 25 MG tablet Take 12.5 mg by mouth 2 (two) times daily. Half a tablet twice daily  . Multiple Vitamins-Minerals (MULTIVITAMIN PO) Take 1 tablet by mouth daily.   Marland Kitchen omeprazole (PRILOSEC) 20 MG capsule Take 20 mg by mouth 2 (two) times daily.   . potassium chloride SA (K-DUR,KLOR-CON) 20 MEQ tablet Take 1 tablet (20 mEq total) by mouth 2 (two) times daily.  Marland Kitchen PROVENTIL HFA 108 (90 Base) MCG/ACT inhaler   . rosuvastatin (CRESTOR) 10 MG tablet Take 10 mg by mouth daily.  . sertraline (ZOLOFT) 100 MG tablet Take 100 mg by mouth at bedtime.  . [DISCONTINUED] benzonatate (TESSALON) 100 MG capsule   . [DISCONTINUED] doxycycline (VIBRAMYCIN) 100 MG capsule   . [DISCONTINUED] fluticasone (FLONASE) 50 MCG/ACT nasal spray Place 2 sprays into both nostrils daily. (Patient not taking: Reported on 08/04/2015)  . [DISCONTINUED] predniSONE (STERAPRED  UNI-PAK 21 TAB) 10 MG (21) TBPK tablet   . [DISCONTINUED] promethazine-codeine (PHENERGAN WITH CODEINE) 6.25-10 MG/5ML syrup  Reported on 08/04/2015   No facility-administered encounter medications on file as of 12/11/2015.  :  Review of Systems:  Out of a complete 14 point review of systems, all are reviewed and negative with the exception of these symptoms as listed below:   Review of Systems  Neurological:       Patient is here for f/u. Patient states that she is doing well with CPAP. Has not used it only a couple of times due to power outage or sickness. Patient feels like her new mask does not feel like it fits very well.     Objective:  Neurologic Exam  Physical Exam Physical Examination:   Filed Vitals:   12/11/15 0934  BP: 142/80  Pulse: 78  Resp: 18    General Examination: The patient is a very pleasant 65 y.o. female in no acute distress. She appears well-developed and well-nourished and well groomed. She is in good spirits today.  HEENT: Normocephalic, atraumatic, pupils are equal, round and reactive to light and accommodation. Extraocular tracking is good without limitation to gaze excursion or nystagmus noted. Normal smooth pursuit is noted. Hearing is grossly intact. Face is symmetric with normal facial animation and normal facial sensation. Speech is clear with no dysarthria noted. There is no hypophonia. There is no lip, neck/head, jaw or voice tremor. Neck is supple with full range of passive and active motion. There are no carotid bruits on auscultation. Oropharynx exam reveals: mild mouth dryness, adequate dental hygiene and moderate airway crowding, due to narrow airway entry and redundant soft palate, tonsils are small bilaterally. Mallampati is class II. Tongue protrudes centrally and palate elevates symmetrically. Nasal inspection reveals no significant nasal mucosal bogginess or redness and no septal deviation.   Chest: Clear to auscultation without wheezing, rhonchi or crackles noted.  Heart: S1+S2+0, regular and normal without murmurs, rubs or gallops noted.   Abdomen: Soft,  non-tender and non-distended with normal bowel sounds appreciated on auscultation.  Extremities: There is no pitting edema in the distal lower extremities bilaterally. Pedal pulses are intact.  Skin: Warm and dry without trophic changes noted. There are no varicose veins.  Musculoskeletal: exam reveals no obvious joint deformities, tenderness or joint swelling or erythema. she reports right ankle pain. She has a elastic sleeve over the right ankle.   Neurologically:  Mental status: The patient is awake, alert and oriented in all 4 spheres. Her immediate and remote memory, attention, language skills and fund of knowledge are appropriate. There is no evidence of aphasia, agnosia, apraxia or anomia. Speech is clear with normal prosody and enunciation. Thought process is linear. Mood is normal and affect is normal.  Cranial nerves II - XII are as described above under HEENT exam. In addition: shoulder shrug is normal with equal shoulder height noted. Motor exam: Normal bulk, strength and tone is noted. There is no drift, tremor or rebound. Romberg is negative. Reflexes are 2+ throughout. Babinski: Toes are flexor bilaterally. Fine motor skills and coordination: intact with normal finger taps, normal hand movements, normal rapid alternating patting, normal foot taps and normal foot agility.  Cerebellar testing: No dysmetria or intention tremor on finger to nose testing. Heel to shin is difficult to some degree d/t body habitus. There is no truncal or gait ataxia.  Sensory exam: intact to light touch in the upper and lower extremities.  Gait, station and balance:  She stands easily. No veering to one side is noted. No leaning to one side is noted. Posture is age-appropriate and stance is narrow based. Gait shows normal stride length and normal pace. No problems turning are noted. Tandem walk is difficult for her, unchanged.   Assessment and Plan:  In summary, Stephanie Jarvis is a very pleasant 70-year  old female with an underlying medical history of hyperlipidemia, vertigo, right foot pain, severe obesity, allergies, hypertension, depression, hyperlipidemia, and reflux disease, who presents for follow-up consultation of her obstructive sleep apne, Established on CPAP therapy. We increased her CPAP pressure to 10 cm  last time and her AHI is good at this time. Her compliance is excellent. I did ask her to meet with her DME provider to get a mask refit. She may have to wait until she is eligible for a new mask. Generally speaking, she has done better with a nasal mask as opposed to a nasal pillows interface but this last mask is not as comfortable as the first one. Physical exam is stable. I commended her for her CPAP compliance. We reviewed her sleep study results from 03/09/2015 as well as her CPAP study results from 04/09/2015 briefly again today and reviewed her most recent CPAP compliance data as well. She  had significant PLMs but does not currently endorse any restless leg symptoms and leg twitching overall has improved with time. In addition, this could be a medication-induced phenomenon secondary to her sertraline. We will continue to monitor. She is advised to continue to be compliant with treatment and strive for weight loss. I explained the importance of being compliant with PAP treatment, not only for insurance purposes but primarily to improve Her symptoms, and for the patient's long term health benefit, including to reduce Her cardiovascular risks. at this juncture, would like to see her back in 12 months, sooner if needed.  I answered all her questions today and the patient was in agreement. I encouraged her to call with any interim questions, concerns, problems or updates.  I spent 25 minutes in total face-to-face time with the patient, more than 50% of which was spent in counseling and coordination of care, reviewing test results, reviewing medication and discussing or reviewing the diagnosis  of OSA, PLMD, its prognosis and treatment options.

## 2015-12-11 NOTE — Patient Instructions (Signed)

## 2016-01-17 DIAGNOSIS — E782 Mixed hyperlipidemia: Secondary | ICD-10-CM | POA: Diagnosis not present

## 2016-03-01 DIAGNOSIS — Z1389 Encounter for screening for other disorder: Secondary | ICD-10-CM | POA: Diagnosis not present

## 2016-03-01 DIAGNOSIS — E782 Mixed hyperlipidemia: Secondary | ICD-10-CM | POA: Diagnosis not present

## 2016-03-01 DIAGNOSIS — Z6841 Body Mass Index (BMI) 40.0 and over, adult: Secondary | ICD-10-CM | POA: Diagnosis not present

## 2016-03-01 DIAGNOSIS — K219 Gastro-esophageal reflux disease without esophagitis: Secondary | ICD-10-CM | POA: Diagnosis not present

## 2016-03-07 ENCOUNTER — Other Ambulatory Visit (HOSPITAL_COMMUNITY): Payer: Self-pay | Admitting: Oncology

## 2016-03-13 ENCOUNTER — Ambulatory Visit (HOSPITAL_COMMUNITY)
Admission: RE | Admit: 2016-03-13 | Discharge: 2016-03-13 | Disposition: A | Discharge status: Home / Self Care | Payer: Medicare Other | Source: Ambulatory Visit | Attending: Oncology | Admitting: Oncology

## 2016-03-13 ENCOUNTER — Telehealth (HOSPITAL_COMMUNITY): Payer: Self-pay | Admitting: *Deleted

## 2016-03-13 DIAGNOSIS — Z0000 Encounter for general adult medical examination without abnormal findings: Secondary | ICD-10-CM

## 2016-03-13 DIAGNOSIS — C50412 Malignant neoplasm of upper-outer quadrant of left female breast: Secondary | ICD-10-CM | POA: Diagnosis not present

## 2016-03-13 DIAGNOSIS — R29890 Loss of height: Secondary | ICD-10-CM | POA: Diagnosis not present

## 2016-03-13 DIAGNOSIS — M170 Bilateral primary osteoarthritis of knee: Secondary | ICD-10-CM

## 2016-03-13 DIAGNOSIS — Z780 Asymptomatic menopausal state: Secondary | ICD-10-CM | POA: Diagnosis not present

## 2016-03-13 NOTE — Telephone Encounter (Signed)
-----   Message from Baird Cancer, PA-C sent at 03/13/2016  2:22 PM EDT ----- Normal

## 2016-03-13 NOTE — Telephone Encounter (Signed)
LMOM that bone density was normal and to call back if she had any questions.

## 2016-04-01 ENCOUNTER — Encounter (HOSPITAL_COMMUNITY): Payer: Medicare Other | Attending: Oncology | Admitting: Oncology

## 2016-04-01 ENCOUNTER — Encounter (HOSPITAL_COMMUNITY): Payer: Medicare Other | Attending: Hematology & Oncology

## 2016-04-01 ENCOUNTER — Encounter (HOSPITAL_COMMUNITY): Payer: Self-pay | Admitting: Oncology

## 2016-04-01 VITALS — BP 128/78 | HR 80 | Temp 98.2°F | Resp 22 | Wt 256.5 lb

## 2016-04-01 DIAGNOSIS — Z170 Estrogen receptor positive status [ER+]: Secondary | ICD-10-CM | POA: Diagnosis not present

## 2016-04-01 DIAGNOSIS — C50912 Malignant neoplasm of unspecified site of left female breast: Secondary | ICD-10-CM | POA: Diagnosis not present

## 2016-04-01 DIAGNOSIS — Z9012 Acquired absence of left breast and nipple: Secondary | ICD-10-CM | POA: Diagnosis not present

## 2016-04-01 DIAGNOSIS — Z0000 Encounter for general adult medical examination without abnormal findings: Secondary | ICD-10-CM

## 2016-04-01 DIAGNOSIS — Z79811 Long term (current) use of aromatase inhibitors: Secondary | ICD-10-CM

## 2016-04-01 DIAGNOSIS — M170 Bilateral primary osteoarthritis of knee: Secondary | ICD-10-CM

## 2016-04-01 DIAGNOSIS — C50412 Malignant neoplasm of upper-outer quadrant of left female breast: Secondary | ICD-10-CM

## 2016-04-01 DIAGNOSIS — Z23 Encounter for immunization: Secondary | ICD-10-CM

## 2016-04-01 DIAGNOSIS — M25512 Pain in left shoulder: Secondary | ICD-10-CM

## 2016-04-01 LAB — CBC WITH DIFFERENTIAL/PLATELET
BASOS ABS: 0 10*3/uL (ref 0.0–0.1)
BASOS PCT: 1 %
Eosinophils Absolute: 0.2 10*3/uL (ref 0.0–0.7)
Eosinophils Relative: 3 %
HEMATOCRIT: 36.3 % (ref 36.0–46.0)
Hemoglobin: 12.2 g/dL (ref 12.0–15.0)
LYMPHS PCT: 27 %
Lymphs Abs: 1.8 10*3/uL (ref 0.7–4.0)
MCH: 31.7 pg (ref 26.0–34.0)
MCHC: 33.6 g/dL (ref 30.0–36.0)
MCV: 94.3 fL (ref 78.0–100.0)
MONO ABS: 0.6 10*3/uL (ref 0.1–1.0)
Monocytes Relative: 8 %
NEUTROS ABS: 4.2 10*3/uL (ref 1.7–7.7)
NEUTROS PCT: 61 %
Platelets: 264 10*3/uL (ref 150–400)
RBC: 3.85 MIL/uL — AB (ref 3.87–5.11)
RDW: 13.6 % (ref 11.5–15.5)
WBC: 6.8 10*3/uL (ref 4.0–10.5)

## 2016-04-01 LAB — COMPREHENSIVE METABOLIC PANEL
ALBUMIN: 3.9 g/dL (ref 3.5–5.0)
ALT: 24 U/L (ref 14–54)
AST: 24 U/L (ref 15–41)
Alkaline Phosphatase: 65 U/L (ref 38–126)
Anion gap: 7 (ref 5–15)
BILIRUBIN TOTAL: 0.5 mg/dL (ref 0.3–1.2)
BUN: 19 mg/dL (ref 6–20)
CHLORIDE: 104 mmol/L (ref 101–111)
CO2: 27 mmol/L (ref 22–32)
CREATININE: 0.81 mg/dL (ref 0.44–1.00)
Calcium: 9.3 mg/dL (ref 8.9–10.3)
GFR calc Af Amer: >60 mL/min (ref >60)
GLUCOSE: 123 mg/dL — AB (ref 65–99)
POTASSIUM: 4 mmol/L (ref 3.5–5.1)
Sodium: 138 mmol/L (ref 135–145)
TOTAL PROTEIN: 7.1 g/dL (ref 6.5–8.1)

## 2016-04-01 MED ORDER — INFLUENZA VAC SPLIT QUAD 0.5 ML IM SUSY
0.5000 mL | PREFILLED_SYRINGE | Freq: Once | INTRAMUSCULAR | Status: AC
Start: 2016-04-01 — End: 2016-04-01
  Administered 2016-04-01: 0.5 mL via INTRAMUSCULAR
  Filled 2016-04-01: qty 0.5

## 2016-04-01 NOTE — Progress Notes (Signed)
[facility] [address] [place] [address]  Preventative health care - Plan: Influenza vac split quadrivalent PF (FLUARIX) injection 0.5 mL  Malignant neoplasm of upper-outer quadrant of left breast in female, estrogen receptor positive ([place]) - Plan: CBC with Differential, Comprehensive metabolic panel, Vitamin D 25 hydroxy  CURRENT THERAPY: Arimidex daily.  INTERVAL HISTORY: Stephanie Jarvis 65 y.o. female returns for followup of Stage I left breast cancer (TisN0M0) ER/PR positive, HER-2/neu unknown (insufficiency tumor for Her-2 status), Ki-67 of 4%, status post left simple mastectomy by Dr. Arnoldo Morale on 01/26/2014, no evidence of disease. Intermediate Grade DCIS.  She is now on anti-estrogen therapy with Arimidex. AND Remote history of stage I (T1c N0 M0) invasive lobular carcinoma of the left breast, status post lumpectomy, sentinel node biopsy, Oncotype DX score 35 with ER PR positive, HER-2/neu negative, treated with lumpectomy, 4 cycles of Taxotere/Cytoxan ending on 02/12/2007, radiotherapy to the left breast with anastrozole 1 mg daily from 01/2007 until 01/2012. (Treated in [place]).    Breast cancer ([place])   12/08/2013 Procedure    Left needle core biopsy      12/09/2013 Pathology Results    Invasive ductal carcinoma with DCIS, ER 100%, PR 31%, Ki-67 marker 4%.  Insufficient material for HER2 testing.      12/28/2013 Breast MRI    Post biopsy changes located within the left breast laterally (upper-outer quadrant) related to the patient's recent stereotactic biopsy. Also, postsurgical scarring changes within the left breast. No areas of worrisome enhancement within the right breast.      01/26/2014 Definitive Surgery    Left modified radical mastectomy by Dr. Arnoldo Morale      02/01/2014 Pathology Results    Intermediate grade DCIS, 0.5 cm, 0/6 lymph nodes, negative resection margins.      03/13/2016 Imaging    Bone density- BMD as  determined from Femur Neck Right is 1.023 g/cm2 with a T-Score of -0.1. This patient is considered normal according to [facility]) criteria.       She is doing well.  She does note some left shoulder pain with edema in the late evening hours periodically.  This could be lymphedema and she is provided dome information regarding lymphedema.  None appreciated today on exam.  We discussed options and she will consider these moving forward.  Review of Systems  Constitutional: Negative.  Negative for chills, fever and weight loss.  HENT: Negative.   Eyes: Negative.  Negative for blurred vision and double vision.  Respiratory: Negative.  Negative for cough and sputum production.   Cardiovascular: Negative.  Negative for chest pain.  Gastrointestinal: Negative.  Negative for constipation, diarrhea, nausea and vomiting.  Genitourinary: Negative.  Negative for dysuria.  Musculoskeletal: Negative.   Skin: Negative.  Negative for rash.  Neurological: Negative.  Negative for weakness and headaches.  Endo/Heme/Allergies: Negative.   Psychiatric/Behavioral: Negative.     Past Medical History:  Diagnosis Date  . Ankle pain   . Anxiety   . Arthritis   . Breast cancer ([place]) 2008   ILC of Left breast; ER+  . Breast cancer ([place]) 2015   IDC+DCIS of Left breast; ER/PR+, Her2-, Ki67 = 4%  . Complication of anesthesia    Aspirates when having per patient  . Dysrhythmia 2011   Atrial Fibrillation  . Family history of anesthesia complication   . GERD (gastroesophageal reflux disease)   . H/O hiatal hernia 2003  .  Hepatitis    Hepatitis A when 65 years old  . High cholesterol   . Irregular heartbeat   . Knee pain   . Sciatica   . Snoring   . Vertigo     Past Surgical History:  Procedure Laterality Date  . BREAST LUMPECTOMY Left 1982,2008   x 2   . HIATAL HERNIA REPAIR  2003  . lymph node removal  2008  . MASTECTOMY MODIFIED RADICAL Left 01/26/2014   Procedure: LEFT  MODIFIED RADICAL MASTECTOMY;  Surgeon: Jamesetta So, MD;  Location: [facility];  Service: General;  Laterality: Left;  . TOTAL KNEE ARTHROPLASTY  2006  . TOTAL KNEE ARTHROPLASTY  08/12/2011   Procedure: TOTAL KNEE ARTHROPLASTY;  Surgeon: Arther Abbott, MD;  Location: [facility];  Service: Orthopedics;  Laterality: Left;  . TUBAL LIGATION  1980    Family History  Problem Relation Age of Onset  . Stroke Father   . Heart disease Father   . Skin cancer Sister     dx. 66-58; unknown type  . Throat cancer Maternal Uncle 75    smoker; voicebox removed  . Alzheimer's disease Paternal Grandmother   . Heart attack Paternal Grandfather   . Alzheimer's disease Maternal Aunt   . Heart disease Maternal Uncle   . Lung cancer Cousin     smoker  . Lung cancer Paternal Aunt     dx. mid-70s; worked at [employer]  . Heart attack Daughter     caused by thyroid issues  . Heart disease    . Arthritis    . Lung disease    . Cancer    . Kidney disease      Social History   Social History  . Marital status: Widowed    Spouse name: N/A  . Number of children: 2  . Years of education: 12   Occupational History  . retired    Social History Main Topics  . Smoking status: Former Smoker    Packs/day: 3.00    Years: 25.00    Types: Cigarettes    Quit date: 06/30/1998  . Smokeless tobacco: Never Used  . Alcohol use 0.0 oz/week     Comment: 2 glasses wine per month  . Drug use: No  . Sexual activity: Yes    Birth control/ protection: Surgical   Other Topics Concern  . None   Social History Narrative   Drinks 2-3 cups of coffee a day      PHYSICAL EXAMINATION  ECOG PERFORMANCE STATUS: 0 - Asymptomatic  Vitals:   04/01/16 1051  BP: 128/78  Pulse: 80  Resp: (!) 22  Temp: 98.2 F (36.8 C)    GENERAL:alert, no distress, well nourished, well developed, comfortable, cooperative, obese, smiling and unaccompanied  SKIN: skin color, texture, turgor are normal, no rashes or significant  lesions HEAD: Normocephalic, No masses, lesions, tenderness or abnormalities EYES: normal, EOMI, Conjunctiva are pink and non-injected EARS: External ears normal OROPHARYNX:lips, buccal mucosa, and tongue normal and mucous membranes are moist  NECK: supple, no adenopathy, thyroid normal size, non-tender, without nodularity, trachea midline LYMPH:  no palpable lymphadenopathy BREAST:risk and benefit of breast self-exam was discussed LUNGS: clear to auscultation and percussion HEART: regular rate & rhythm, no murmurs and no gallops ABDOMEN:abdomen soft, obese and normal bowel sounds BACK: Back symmetric, no curvature. EXTREMITIES:less then 2 second capillary refill, no joint deformities, effusion, or inflammation, no skin discoloration, no cyanosis  NEURO: alert & oriented x 3 with fluent speech, no  focal motor/sensory deficits, gait normal   LABORATORY DATA: CBC    Component Value Date/Time   WBC 6.8 04/01/2016 1024   RBC 3.85 (L) 04/01/2016 1024   HGB 12.2 04/01/2016 1024   HCT 36.3 04/01/2016 1024   PLT 264 04/01/2016 1024   MCV 94.3 04/01/2016 1024   MCH 31.7 04/01/2016 1024   MCHC 33.6 04/01/2016 1024   RDW 13.6 04/01/2016 1024   LYMPHSABS 1.8 04/01/2016 1024   MONOABS 0.6 04/01/2016 1024   EOSABS 0.2 04/01/2016 1024   BASOSABS 0.0 04/01/2016 1024      Chemistry      Component Value Date/Time   NA 138 04/01/2016 1024   K 4.0 04/01/2016 1024   CL 104 04/01/2016 1024   CO2 27 04/01/2016 1024   BUN 19 04/01/2016 1024   CREATININE 0.81 04/01/2016 1024      Component Value Date/Time   CALCIUM 9.3 04/01/2016 1024   ALKPHOS 65 04/01/2016 1024   AST 24 04/01/2016 1024   ALT 24 04/01/2016 1024   BILITOT 0.5 04/01/2016 1024        PENDING LABS:   RADIOGRAPHIC STUDIES:  Dg Bone Density  Result Date: 03/13/2016 EXAM: DUAL X-RAY ABSORPTIOMETRY (DXA) FOR BONE MINERAL DENSITY IMPRESSION: Ordering Physician:  Dr. Baird Cancer, Your patient Louretta Tantillo  completed a BMD test on 03/13/2016 using the [employer] (software version: 14.10) manufactured by [employer]. The following summarizes the results of our evaluation. PATIENT BIOGRAPHICAL: Name: TALLI, KIMMER Patient ID: [id number] Birth Date: 09/23/1950 Height: 64.5 in. Gender: Female Exam Date: 03/13/2016 Weight: 255.0 lbs. Indications: Caucasian, Height Loss, History of Fracture (Adult), Hx Breast Ca, Post Menopausal Fractures: Foot Treatments: Arimidex, Calcium, Multivitamin DENSITOMETRY RESULTS: Site          Region     Measured Date Measured Age WHO Classification Young Adult T-score BMD         %Change vs. Previous Significant Change (*) DualFemur Neck Right 03/13/2016 66.9 Normal -0.1 1.023 g/cm2 -5.4% Yes DualFemur Neck Right 03/09/2014 64.9 Normal 0.3 1.081 g/cm2 - - Right Forearm Radius 33% 03/13/2016 66.9 Normal 0.1 0.718 g/cm2 -0.8% - Right Forearm Radius 33% 03/09/2014 64.9 Normal 0.1 0.724 g/cm2 - - ASSESSMENT: BMD as determined from Femur Neck Right is 1.023 g/cm2 with a T-Score of -0.1. This patient is considered normal according to [facility]) criteria. Compared with the prior study on 03/09/14, the BMD of the lt. femoral neck shows a statistically significant decrease. (Lumbar spine was not utilized due to advanced degenerative changes.) World Pharmacologist (WHO) criteria for post-menopausal, Caucasian Women: Normal:       T-score at or above -1 SD Osteopenia:   T-score between -1 and -2.5 SD Osteoporosis: T-score at or below -2.5 SD RECOMMENDATIONS: [place] recommends that FDA-approved medial therapies be considered in postmenopausal women and men age 26 or older with a: 1. Hip or vertebral (clinical or morphometric) fracture. 2. T-Score of < -2.5 at the spine or hip. 3. Ten-year fracture probability by FRAX of 3% or greater for hip fracture or 20% or greater for major osteoporotic fracture. All treatment decisions  require clinical judgment and consideration of individual patient factors, including patient preferences, co-morbidities, previous drug use, risk factors not captured in the FRAX model (e.g. falls, vitamin D deficiency, increased bone turnover, interval significant decline in bone density) and possible under-or over-estimation of fracture risk by FRAX. All patients should ensure an adequate intake of dietary  calcium (1200 mg/d) and vitamin D (800 IU daily) unless contraindicated. FOLLOW-UP: People with diagnosed cases of osteoporosis or osteopenia should be regularly tested for bone mineral density. For patients eligible for Medicare, routine testing is allowed once every 2 years. Testing frequency can be increased for patients who have rapidly progressing disease, or for those who are receiving medical therapy to restore bone mass. I have reviewed this report, and agree with the above findings. [facility] Electronically Signed   By: Earle Gell M.D.   On: 03/13/2016 11:21     PATHOLOGY:    ASSESSMENT AND PLAN:  Breast cancer ([place]) Stage I left breast cancer (TisN0M0) ER/PR positive, HER-2/neu unknown (insufficiency tumor for Her-2 status), Ki-67 of 4%, status post left simple mastectomy by Dr. Arnoldo Morale on 01/26/2014, no evidence of disease. Intermediate Grade DCIS.  She is now on anti-estrogen therapy with Arimidex. AND Remote history of stage I (T1c N0 M0) invasive lobular carcinoma of the left breast, status post lumpectomy, sentinel node biopsy, Oncotype DX score 35 with ER PR positive, HER-2/neu negative, treated with lumpectomy, 4 cycles of Taxotere/Cytoxan ending on 02/12/2007, radiotherapy to the left breast with anastrozole 1 mg daily from 01/2007 until 01/2012. (Treated in [place]).  Oncology history updated.  Labs today: CBC diff, CMET, Vit D.  I personally reviewed and went over laboratory results with the patient.  The results are noted within this dictation.  I personally  reviewed and went over radiographic studies with the patient.  The results are noted within this dictation.  Mammogram on 12/05/2015 was BIRADS 1.  Bone density on 03/13/2016 is normal.  Labs in 4 months: CBC diff, CMET, Vit D.  She is currently on Ca++ and Vit D supplementation. She is encouraged to continue with this.  Influenza vaccine given today.  Return in 4 months for follow-up.   ORDERS PLACED FOR THIS ENCOUNTER: Orders Placed This Encounter  Procedures  . CBC with Differential  . Comprehensive metabolic panel  . Vitamin D 25 hydroxy    MEDICATIONS PRESCRIBED THIS ENCOUNTER: Meds ordered this encounter  Medications  . Influenza vac split quadrivalent PF (FLUARIX) injection 0.5 mL  . rosuvastatin (CRESTOR) 20 MG tablet    THERAPY PLAN:  NCCN guidelines recommends the following surveillance for invasive breast cancer (2.2017):  A. History and Physical exam 1-4 times per year as clinically appropriate for 5 years, then annually.  B. Periodic screening for changes in family history and referral to genetics counseling as indicated  C. Educate, monitor, and refer to lymphedema management.  D. Mammography every 12 months  E. Routine imaging of reconstructed breast is not indicated.  F. In the absence of clinical signs and symptoms suggestive of recurrent disease, there is no indication for laboratory or imaging studies for metastases screening.  G. Women on Tamoxifen: annual gynecologic assessment every 12 months if uterus is present.  H. Women on aromatase inhibitor or who experience ovarian failure secondary to treatment should have monitoring of bone health with a bone mineral density determination at baseline and periodically thereafter.  I. Assess and encourage adherence to adjuvant endocrine therapy.  J. Evidence suggests that active lifestyle, healthy diet, limited alcohol intake, and achieving and maintaining an ideal body weight (20-25 BMI) may lead to optimal breast  cancer outcomes.   All questions were answered. The patient knows to call the clinic with any problems, questions or concerns. We can certainly see the patient much sooner if necessary.  Patient and plan discussed with  Dr. Ancil Linsey and she is in agreement with the aforementioned.   This note is electronically signed by: Robynn Pane, PA-C 04/01/2016 11:22 AM

## 2016-04-01 NOTE — Progress Notes (Signed)
Rolanda Jay presents today for injection per MD orders. Flu Vaccine administered IM in right Upper Arm. Administration without incident. Patient tolerated well.

## 2016-04-01 NOTE — Patient Instructions (Addendum)
[place] at [facility] Discharge Instructions  RECOMMENDATIONS MADE BY THE CONSULTANT AND ANY TEST RESULTS WILL BE SENT TO YOUR REFERRING PHYSICIAN.  You saw Kirby Crigler, PA-C, today. You had flu shot today. Labs and follow up in 4 months.   Thank you for choosing [place] at [facility] to provide your oncology and hematology care.  To afford each patient quality time with our provider, please arrive at least 15 minutes before your scheduled appointment time.   Beginning January 23rd 2017 lab work for the [facility] will be done in the  Main lab at [facility] on 1st floor. If you have a lab appointment with the [facility] please come in thru the  Main Entrance and check in at the main information desk  You need to re-schedule your appointment should you arrive 10 or more minutes late.  We strive to give you quality time with our providers, and arriving late affects you and other patients whose appointments are after yours.  Also, if you no show three or more times for appointments you may be dismissed from the clinic at the providers discretion.     Again, thank you for choosing [facility].  Our hope is that these requests will decrease the amount of time that you wait before being seen by our physicians.       _____________________________________________________________  Should you have questions after your visit to [facility], please contact our office at [phone] between the hours of 8:30 a.m. and 4:30 p.m.  Voicemails left after 4:30 p.m. will not be returned until the following business day.  For prescription refill requests, have your pharmacy contact our office.         Resources For Cancer Patients and their Caregivers ? American Cancer Society: Can assist with transportation, wigs, general needs, runs Look Good Feel Better.        6095828108 ? Cancer Care: Provides financial  assistance, online support groups, medication/co-pay assistance.  1-800-813-HOPE (606)301-0789) ? [place] Assists [place] cancer patients and their families through emotional , educational and financial support.  548-305-5117 ? [place] DSS Where to apply for food stamps, Medicaid and utility assistance. (236)063-7694 ? [facility]: Transportation to medical appointments. 269 786 2129 ? Social Security Administration: May apply for disability if have a Stage IV cancer. 661-203-0453 (684)151-4058 ? [employer], Disability and Transit Services: Assists with nutrition, care and transit needs. [place] Support Programs: @10RELATIVEDAYS @ > Cancer Support Group  2nd Tuesday of the month 1pm-2pm, Journey Room  > Creative Journey  3rd Tuesday of the month 1130am-1pm, Journey Room  > Look Good Feel Better  1st Wednesday of the month 10am-12 noon, Journey Room (Call American Cancer Society to register [phone])   Influenza Virus Vaccine injection (Fluarix) What is this medicine? INFLUENZA VIRUS VACCINE (in floo EN zuh VAHY ruhs vak SEEN) helps to reduce the risk of getting influenza also known as the flu. This medicine may be used for other purposes; ask your health care provider or pharmacist if you have questions. What should I tell my health care provider before I take this medicine? They need to know if you have any of these conditions: -bleeding disorder like hemophilia -fever or infection -Guillain-Barre syndrome or other neurological problems -immune system problems -infection with the human immunodeficiency virus (HIV) or AIDS -low blood platelet counts -multiple sclerosis -an unusual or allergic reaction to  influenza virus vaccine, eggs, chicken proteins, latex, gentamicin, other medicines, foods, dyes or preservatives -pregnant or trying to get pregnant -breast-feeding How should I use this medicine? This vaccine is for  injection into a muscle. It is given by a health care professional. A copy of Vaccine Information Statements will be given before each vaccination. Read this sheet carefully each time. The sheet may change frequently. Talk to your pediatrician regarding the use of this medicine in children. Special care may be needed. Overdosage: If you think you have taken too much of this medicine contact a poison control center or emergency room at once. NOTE: This medicine is only for you. Do not share this medicine with others. What if I miss a dose? This does not apply. What may interact with this medicine? -chemotherapy or radiation therapy -medicines that lower your immune system like etanercept, anakinra, infliximab, and adalimumab -medicines that treat or prevent blood clots like warfarin -phenytoin -steroid medicines like prednisone or cortisone -theophylline -vaccines This list may not describe all possible interactions. Give your health care provider a list of all the medicines, herbs, non-prescription drugs, or dietary supplements you use. Also tell them if you smoke, drink alcohol, or use illegal drugs. Some items may interact with your medicine. What should I watch for while using this medicine? Report any side effects that do not go away within 3 days to your doctor or health care professional. Call your health care provider if any unusual symptoms occur within 6 weeks of receiving this vaccine. You may still catch the flu, but the illness is not usually as bad. You cannot get the flu from the vaccine. The vaccine will not protect against colds or other illnesses that may cause fever. The vaccine is needed every year. What side effects may I notice from receiving this medicine? Side effects that you should report to your doctor or health care professional as soon as possible: -allergic reactions like skin rash, itching or hives, swelling of the face, lips, or tongue Side effects that usually do  not require medical attention (report to your doctor or health care professional if they continue or are bothersome): -fever -headache -muscle aches and pains -pain, tenderness, redness, or swelling at site where injected -weak or tired This list may not describe all possible side effects. Call your doctor for medical advice about side effects. You may report side effects to FDA at 1-800-FDA-1088. Where should I keep my medicine? This vaccine is only given in a clinic, pharmacy, doctor's office, or other health care setting and will not be stored at home. NOTE: This sheet is a summary. It may not cover all possible information. If you have questions about this medicine, talk to your doctor, pharmacist, or health care provider.    2016, Elsevier/Gold Standard. (2008-01-13 09:30:40)

## 2016-04-01 NOTE — Assessment & Plan Note (Addendum)
Stage I left breast cancer (TisN0M0) ER/PR positive, HER-2/neu unknown (insufficiency tumor for Her-2 status), Ki-67 of 4%, status post left simple mastectomy by Dr. Arnoldo Morale on 01/26/2014, no evidence of disease. Intermediate Grade DCIS.  She is now on anti-estrogen therapy with Arimidex. AND Remote history of stage I (T1c N0 M0) invasive lobular carcinoma of the left breast, status post lumpectomy, sentinel node biopsy, Oncotype DX score 35 with ER PR positive, HER-2/neu negative, treated with lumpectomy, 4 cycles of Taxotere/Cytoxan ending on 02/12/2007, radiotherapy to the left breast with anastrozole 1 mg daily from 01/2007 until 01/2012. (Treated in [place]).  Oncology history updated.  Labs today: CBC diff, CMET, Vit D.  I personally reviewed and went over laboratory results with the patient.  The results are noted within this dictation.  I personally reviewed and went over radiographic studies with the patient.  The results are noted within this dictation.  Mammogram on 12/05/2015 was BIRADS 1.  Bone density on 03/13/2016 is normal.  Labs in 4 months: CBC diff, CMET, Vit D.  She is currently on Ca++ and Vit D supplementation. She is encouraged to continue with this.  Influenza vaccine given today.  Return in 4 months for follow-up.

## 2016-04-02 LAB — VITAMIN D 25 HYDROXY (VIT D DEFICIENCY, FRACTURES): Vit D, 25-Hydroxy: 37.4 ng/mL (ref 30.0–100.0)

## 2016-04-03 ENCOUNTER — Telehealth (HOSPITAL_COMMUNITY): Payer: Self-pay | Admitting: *Deleted

## 2016-04-03 NOTE — Telephone Encounter (Signed)
-----   Message from Baird Cancer, PA-C sent at 04/02/2016  5:17 PM EDT ----- WNL.

## 2016-04-03 NOTE — Telephone Encounter (Signed)
LMOM that labs were good.  

## 2016-05-21 DIAGNOSIS — Z6841 Body Mass Index (BMI) 40.0 and over, adult: Secondary | ICD-10-CM | POA: Diagnosis not present

## 2016-05-21 DIAGNOSIS — Z0000 Encounter for general adult medical examination without abnormal findings: Secondary | ICD-10-CM | POA: Diagnosis not present

## 2016-07-11 ENCOUNTER — Other Ambulatory Visit (HOSPITAL_COMMUNITY): Payer: Self-pay | Admitting: Hematology & Oncology

## 2016-08-02 ENCOUNTER — Other Ambulatory Visit (HOSPITAL_COMMUNITY): Payer: Medicare Other

## 2016-08-02 ENCOUNTER — Ambulatory Visit (HOSPITAL_COMMUNITY): Payer: Medicare Other | Admitting: Hematology & Oncology

## 2016-09-02 ENCOUNTER — Encounter (HOSPITAL_COMMUNITY): Payer: Medicare Other

## 2016-09-02 ENCOUNTER — Encounter (HOSPITAL_COMMUNITY): Payer: Medicare Other | Attending: Oncology | Admitting: Oncology

## 2016-09-02 ENCOUNTER — Encounter (HOSPITAL_COMMUNITY): Payer: Self-pay

## 2016-09-02 VITALS — BP 142/84 | HR 96 | Temp 98.4°F | Resp 20 | Wt 258.7 lb

## 2016-09-02 DIAGNOSIS — C50412 Malignant neoplasm of upper-outer quadrant of left female breast: Secondary | ICD-10-CM

## 2016-09-02 DIAGNOSIS — C50912 Malignant neoplasm of unspecified site of left female breast: Secondary | ICD-10-CM | POA: Diagnosis not present

## 2016-09-02 DIAGNOSIS — C50511 Malignant neoplasm of lower-outer quadrant of right female breast: Secondary | ICD-10-CM

## 2016-09-02 DIAGNOSIS — Z170 Estrogen receptor positive status [ER+]: Secondary | ICD-10-CM | POA: Diagnosis not present

## 2016-09-02 DIAGNOSIS — N951 Menopausal and female climacteric states: Secondary | ICD-10-CM

## 2016-09-02 DIAGNOSIS — Z79811 Long term (current) use of aromatase inhibitors: Secondary | ICD-10-CM | POA: Diagnosis not present

## 2016-09-02 DIAGNOSIS — Z853 Personal history of malignant neoplasm of breast: Secondary | ICD-10-CM | POA: Diagnosis not present

## 2016-09-02 LAB — CBC WITH DIFFERENTIAL/PLATELET
BASOS ABS: 0.1 10*3/uL (ref 0.0–0.1)
Basophils Relative: 1 %
Eosinophils Absolute: 0.2 10*3/uL (ref 0.0–0.7)
Eosinophils Relative: 3 %
HCT: 38 % (ref 36.0–46.0)
HEMOGLOBIN: 12.8 g/dL (ref 12.0–15.0)
LYMPHS ABS: 2.5 10*3/uL (ref 0.7–4.0)
LYMPHS PCT: 30 %
MCH: 31.7 pg (ref 26.0–34.0)
MCHC: 33.7 g/dL (ref 30.0–36.0)
MCV: 94.1 fL (ref 78.0–100.0)
Monocytes Absolute: 0.7 10*3/uL (ref 0.1–1.0)
Monocytes Relative: 8 %
NEUTROS PCT: 58 %
Neutro Abs: 4.7 10*3/uL (ref 1.7–7.7)
Platelets: 317 10*3/uL (ref 150–400)
RBC: 4.04 MIL/uL (ref 3.87–5.11)
RDW: 13.8 % (ref 11.5–15.5)
WBC: 8.2 10*3/uL (ref 4.0–10.5)

## 2016-09-02 LAB — COMPREHENSIVE METABOLIC PANEL
ALK PHOS: 69 U/L (ref 38–126)
ALT: 29 U/L (ref 14–54)
AST: 29 U/L (ref 15–41)
Albumin: 4 g/dL (ref 3.5–5.0)
Anion gap: 8 (ref 5–15)
BUN: 14 mg/dL (ref 6–20)
CHLORIDE: 98 mmol/L — AB (ref 101–111)
CO2: 28 mmol/L (ref 22–32)
CREATININE: 0.81 mg/dL (ref 0.44–1.00)
Calcium: 9.4 mg/dL (ref 8.9–10.3)
GFR calc non Af Amer: >60 mL/min (ref >60)
GLUCOSE: 151 mg/dL — AB (ref 65–99)
Potassium: 3.9 mmol/L (ref 3.5–5.1)
SODIUM: 134 mmol/L — AB (ref 135–145)
Total Bilirubin: 0.5 mg/dL (ref 0.3–1.2)
Total Protein: 7.4 g/dL (ref 6.5–8.1)

## 2016-09-02 MED ORDER — ANASTROZOLE 1 MG PO TABS: 1.0000 mg | ORAL_TABLET | Freq: Every day | ORAL | 1 refills | Status: DC

## 2016-09-02 NOTE — Progress Notes (Signed)
[facility] [place] [address]   DIAGNOSIS: Stage I (Ta N0M0) ER/PR positive, HER-2/neu unknown, (insufficiency tumor for Her-2 status) Ki-67 of 4%, status post left simple mastectomy, no evidence of disease. Intermediate Grade DCIS  Remote history of stage I (T1c N0 M0) invasive lobular carcinoma of the left breast, status post lumpectomy, sentinel node biopsy, Oncotype DX score 35 with ER PR positive, HER-2/neu negative, treated with lumpectomy, 4 cycles of Taxotere/Cytoxan ending on 02/12/2007, radiotherapy to the left breast with anastrozole 1 mg daily from 01/2007 until 01/2012. (Treated in [place])  DEXA bone density 03/09/2014; normal bone density  CURRENT THERAPY: Arimidex 1 mg daily  INTERVAL HISTORY: Stephanie Jarvis 66 y.o. female returns for follow-up of a stage I ER positive, PR positive, HER-2 unknown breast cancer of the left breast. She was originally diagnosed with breast cancer in the left breast in 2008. She was treated with surgery, chemotherapy and radiation at that time. She states she took Arimidex for approximately 5 years.   She was then diagnosed with a second breast cancer in the left breast in 2015. She has undergone a left mastectomy. She was started back on Arimidex.  She has been doing well. Denies problems with the Arimidex. The hot flashes have been "horrible". She is still taking calcium and vitamin D. She had her last mammogram in June or July of 2017. She has some joint pain, which she takes meloxicam for. She has occasional SOB, but this is seasonal. Denies chest pain, loss of appetite, or any other concerns.   MEDICAL HISTORY: Past Medical History:  Diagnosis Date  . Ankle pain   . Anxiety   . Arthritis   . Breast cancer ([place]) 2008   ILC of Left breast; ER+  . Breast cancer ([place]) 2015   IDC+DCIS of Left breast; ER/PR+, Her2-, Ki67 = 4%  . Complication of anesthesia    Aspirates when having per patient   . Dysrhythmia 2011   Atrial Fibrillation  . Family history of anesthesia complication   . GERD (gastroesophageal reflux disease)   . H/O hiatal hernia 2003  . Hepatitis    Hepatitis A when 66 years old  . High cholesterol   . Irregular heartbeat   . Knee pain   . Sciatica   . Snoring   . Vertigo     has OA (osteoarthritis) of knee; Pain in joint, lower leg; Stiffness of joint, lower leg; Difficulty in walking(719.7); Swelling of joint of left knee; PTTD (posterior tibial tendon dysfunction); Metatarsal fracture; and Breast cancer ([place]) on her problem list.     has No Known Allergies.  Ms. Wee had no medications administered during this visit.  SURGICAL HISTORY: Past Surgical History:  Procedure Laterality Date  . BREAST LUMPECTOMY Left 1982,2008   x 2   . HIATAL HERNIA REPAIR  2003  . lymph node removal  2008  . MASTECTOMY MODIFIED RADICAL Left 01/26/2014   Procedure: LEFT MODIFIED RADICAL MASTECTOMY;  Surgeon: Jamesetta So, MD;  Location: [facility];  Service: General;  Laterality: Left;  . TOTAL KNEE ARTHROPLASTY  2006  . TOTAL KNEE ARTHROPLASTY  08/12/2011   Procedure: TOTAL KNEE ARTHROPLASTY;  Surgeon: Arther Abbott, MD;  Location: [facility];  Service: Orthopedics;  Laterality: Left;  . TUBAL LIGATION  1980    SOCIAL HISTORY: Social History   Social History  . Marital status: Widowed    Spouse name: N/A  . Number of  children: 2  . Years of education: 12   Occupational History  . retired    Social History Main Topics  . Smoking status: Former Smoker    Packs/day: 3.00    Years: 25.00    Types: Cigarettes    Quit date: 06/30/1998  . Smokeless tobacco: Never Used  . Alcohol use 0.0 oz/week     Comment: 2 glasses wine per month  . Drug use: No  . Sexual activity: Yes    Birth control/ protection: Surgical   Other Topics Concern  . Not on file   Social History Narrative   Drinks 2-3 cups of coffee a day     FAMILY HISTORY: Family History  Problem  Relation Age of Onset  . Stroke Father   . Heart disease Father   . Skin cancer Sister     dx. 98-58; unknown type  . Throat cancer Maternal Uncle 75    smoker; voicebox removed  . Alzheimer's disease Paternal Grandmother   . Heart attack Paternal Grandfather   . Alzheimer's disease Maternal Aunt   . Heart disease Maternal Uncle   . Lung cancer Cousin     smoker  . Lung cancer Paternal Aunt     dx. mid-70s; worked at [employer]  . Heart attack Daughter     caused by thyroid issues  . Heart disease    . Arthritis    . Lung disease    . Cancer    . Kidney disease      Review of Systems  Constitutional:       Hot flashes No loss of appetite  HENT: Negative.   Eyes: Negative.   Respiratory: Positive for shortness of breath.   Cardiovascular: Negative.  Negative for chest pain.  Gastrointestinal: Negative.   Genitourinary: Negative.   Musculoskeletal: Positive for joint pain.  Skin: Negative.   Neurological: Negative.   Endo/Heme/Allergies: Negative.   Psychiatric/Behavioral: Negative.   All other systems reviewed and are negative. 14 point review of systems was performed and is negative except as detailed under history of present illness and above  PHYSICAL EXAMINATION ECOG PERFORMANCE STATUS: 0 - Asymptomatic  Vitals:   09/02/16 1445  BP: (!) 142/84  Pulse: 96  Resp: 20  Temp: 98.4 F (36.9 C)   Physical Exam  Constitutional: She is oriented to person, place, and time and well-developed, well-nourished, and in no distress. No distress.  HENT:  Head: Normocephalic and atraumatic.  Mouth/Throat: No oropharyngeal exudate.  Eyes: Conjunctivae and EOM are normal. Pupils are equal, round, and reactive to light. No scleral icterus.  Neck: Normal range of motion. Neck supple. No JVD present.  Cardiovascular: Normal rate, regular rhythm and normal heart sounds.  Exam reveals no gallop and no friction rub.   No murmur heard. Pulmonary/Chest: Effort normal and  breath sounds normal. No respiratory distress. She has no wheezes. She has no rales.  BREAST: patient refused breast exam today.  Abdominal: Soft. Bowel sounds are normal. She exhibits no distension. There is no tenderness. There is no guarding.  Musculoskeletal: Normal range of motion. She exhibits no edema or tenderness.  Lymphadenopathy:    She has no cervical adenopathy.  Neurological: She is alert and oriented to person, place, and time. No cranial nerve deficit. Gait normal.  Skin: Skin is warm and dry. No rash noted. No erythema. No pallor.  Psychiatric: Affect and judgment normal.  Nursing note and vitals reviewed.  LABORATORY DATA: I have reviewed the data as  listed  CBC    Component Value Date/Time   WBC 8.2 09/02/2016 1410   RBC 4.04 09/02/2016 1410   HGB 12.8 09/02/2016 1410   HCT 38.0 09/02/2016 1410   PLT 317 09/02/2016 1410   MCV 94.1 09/02/2016 1410   MCH 31.7 09/02/2016 1410   MCHC 33.7 09/02/2016 1410   RDW 13.8 09/02/2016 1410   LYMPHSABS 2.5 09/02/2016 1410   MONOABS 0.7 09/02/2016 1410   EOSABS 0.2 09/02/2016 1410   BASOSABS 0.1 09/02/2016 1410   CMP     Component Value Date/Time   NA 134 (L) 09/02/2016 1410   K 3.9 09/02/2016 1410   CL 98 (L) 09/02/2016 1410   CO2 28 09/02/2016 1410   GLUCOSE 151 (H) 09/02/2016 1410   BUN 14 09/02/2016 1410   CREATININE 0.81 09/02/2016 1410   CALCIUM 9.4 09/02/2016 1410   PROT 7.4 09/02/2016 1410   ALBUMIN 4.0 09/02/2016 1410   AST 29 09/02/2016 1410   ALT 29 09/02/2016 1410   ALKPHOS 69 09/02/2016 1410   BILITOT 0.5 09/02/2016 1410   GFRNONAA >60 09/02/2016 1410   GFRAA >60 09/02/2016 1410   RADIOLOGY: I have reviewed the images below and agree with the reported results  DEXA Scan 03/13/2016 DENSITOMETRY RESULTS: Site          Region     Measured Date Measured Age WHO Classification Young Adult T-score BMD         %Change vs. Previous Significant Change (*)  DualFemur Neck Right 1/13/2016 66.9  Normal -0.1 1.023 g/cm2 -5.4% Yes DualFemur Neck Right 1/09/2014 64.9 Normal 0.3 1.081 g/cm2 - -  Right Forearm Radius 33% 03/13/2016 66.9 Normal 0.1 0.718 g/cm2 -0.8% - Right Forearm Radius 33% 03/09/2014 64.9 Normal 0.1 0.724 g/cm2 - - ASSESSMENT: BMD as determined from Femur Neck Right is 1.023 g/cm2 with a T-Score of -0.1. This patient is considered normal according to [facility]) criteria. Compared with the prior study on 03/09/14, the BMD of the lt. femoral neck shows a statistically significant decrease. (Lumbar spine was not utilized due to advanced degenerative changes.)  ASSESSMENT and THERAPY PLAN:   Stage I ER+, PR+ , Her-2 neu L breast cancer History of lobular carcinoma of the L breast in 2008, ER+ NORMAL DEXA 03/2014  Pleasant 66 year old female with a history of stage I ER positive, PR positive below her carcinoma left breast. She then developed ER positive, PR positive DCIS in the left breast and underwent mastectomy. She was started back on Arimidex.  Last DEXA showed normal bone density. Last vitamin D level was normal. She is on calcium plus D. She wishes to stay on Arimidex for now.  I have filled out the paperwork for her temporary handicap parking pass for the next 6 months, however next time she will go to her PCP for this.   I encouraged her to see a respiratory specialist for her SOB.   Continue calcium, vitamin D, and Arimidex. Refilled Arimidex.   Ordered R screening mammogram for June 2018.  Reviewed last bone density scan with the patient today.   She will return for follow up in 6 months.   All questions were answered. The patient knows to call the clinic with any problems, questions or concerns. We can certainly see the patient much sooner if necessary.   This document serves as a record of services personally performed by Twana First, MD. It was created on her behalf by Martinique Casey, a trained medical scribe.  The creation of  this record is based on the scribe's personal observations and the provider's statements to them. This document has been checked and approved by the attending provider.  I have reviewed the above documentation for accuracy and completeness, and I agree with the above.  This note was electronically signed.

## 2016-09-02 NOTE — Patient Instructions (Addendum)
[place] at [facility] Discharge Instructions  RECOMMENDATIONS MADE BY THE CONSULTANT AND ANY TEST RESULTS WILL BE SENT TO YOUR REFERRING PHYSICIAN.  You were seen today by Dr. Twana First We will schedule your mammogram in June Follow up in 6 months See Amy up front for appointments   Thank you for choosing [place] at [facility] to provide your oncology and hematology care.  To afford each patient quality time with our provider, please arrive at least 15 minutes before your scheduled appointment time.    If you have a lab appointment with the [facility] please come in thru the  Main Entrance and check in at the main information desk  You need to re-schedule your appointment should you arrive 10 or more minutes late.  We strive to give you quality time with our providers, and arriving late affects you and other patients whose appointments are after yours.  Also, if you no show three or more times for appointments you may be dismissed from the clinic at the providers discretion.     Again, thank you for choosing [facility].  Our hope is that these requests will decrease the amount of time that you wait before being seen by our physicians.       _____________________________________________________________  Should you have questions after your visit to [facility], please contact our office at [phone] between the hours of 8:30 a.m. and 4:30 p.m.  Voicemails left after 4:30 p.m. will not be returned until the following business day.  For prescription refill requests, have your pharmacy contact our office.       Resources For Cancer Patients and their Caregivers ? American Cancer Society: Can assist with transportation, wigs, general needs, runs Look Good Feel Better.        313 503 8918 ? Cancer Care: Provides financial assistance, online support groups, medication/co-pay assistance.   1-800-813-HOPE 262-632-9384) ? [facility] Assists [place] cancer patients and their families through emotional , educational and financial support.  405-358-9331 ? [place] DSS Where to apply for food stamps, Medicaid and utility assistance. 425-435-3083 ? [facility]: Transportation to medical appointments. 629-225-4443 ? Social Security Administration: May apply for disability if have a Stage IV cancer. (585)488-5055 607-115-8518 ? [employer], Disability and Transit Services: Assists with nutrition, care and transit needs. [place] Support Programs: @10RELATIVEDAYS @ > Cancer Support Group  2nd Tuesday of the month 1pm-2pm, Journey Room  > Creative Journey  3rd Tuesday of the month 1130am-1pm, Journey Room  > Look Good Feel Better  1st Wednesday of the month 10am-12 noon, Journey Room (Call [place] to register [phone])

## 2016-09-03 LAB — VITAMIN D 25 HYDROXY (VIT D DEFICIENCY, FRACTURES): Vit D, 25-Hydroxy: 34.8 ng/mL (ref 30.0–100.0)

## 2016-09-23 ENCOUNTER — Other Ambulatory Visit (HOSPITAL_COMMUNITY): Payer: Self-pay | Admitting: Respiratory Therapy

## 2016-09-23 DIAGNOSIS — D0591 Unspecified type of carcinoma in situ of right breast: Secondary | ICD-10-CM | POA: Diagnosis not present

## 2016-09-23 DIAGNOSIS — E785 Hyperlipidemia, unspecified: Secondary | ICD-10-CM | POA: Diagnosis not present

## 2016-09-23 DIAGNOSIS — I499 Cardiac arrhythmia, unspecified: Secondary | ICD-10-CM

## 2016-09-23 DIAGNOSIS — E7800 Pure hypercholesterolemia, unspecified: Secondary | ICD-10-CM | POA: Diagnosis not present

## 2016-09-23 DIAGNOSIS — E669 Obesity, unspecified: Secondary | ICD-10-CM | POA: Diagnosis not present

## 2016-09-23 DIAGNOSIS — F339 Major depressive disorder, recurrent, unspecified: Secondary | ICD-10-CM | POA: Diagnosis not present

## 2016-09-23 DIAGNOSIS — Z0001 Encounter for general adult medical examination with abnormal findings: Secondary | ICD-10-CM | POA: Diagnosis not present

## 2016-09-23 DIAGNOSIS — I519 Heart disease, unspecified: Secondary | ICD-10-CM

## 2016-09-24 ENCOUNTER — Ambulatory Visit (HOSPITAL_COMMUNITY)
Admission: RE | Admit: 2016-09-24 | Discharge: 2016-09-24 | Disposition: A | Discharge status: Home / Self Care | Payer: Medicare Other | Source: Ambulatory Visit | Attending: Internal Medicine | Admitting: Internal Medicine

## 2016-09-24 DIAGNOSIS — I499 Cardiac arrhythmia, unspecified: Secondary | ICD-10-CM | POA: Diagnosis not present

## 2016-09-30 ENCOUNTER — Ambulatory Visit (HOSPITAL_COMMUNITY): Payer: Medicare Other

## 2016-10-10 DIAGNOSIS — N390 Urinary tract infection, site not specified: Secondary | ICD-10-CM | POA: Diagnosis not present

## 2016-10-10 DIAGNOSIS — M549 Dorsalgia, unspecified: Secondary | ICD-10-CM | POA: Diagnosis not present

## 2016-11-05 ENCOUNTER — Telehealth (HOSPITAL_COMMUNITY): Payer: Self-pay | Admitting: *Deleted

## 2016-11-05 ENCOUNTER — Other Ambulatory Visit (HOSPITAL_COMMUNITY): Payer: Self-pay | Admitting: Adult Health

## 2016-11-05 DIAGNOSIS — N644 Mastodynia: Secondary | ICD-10-CM

## 2016-11-05 NOTE — Telephone Encounter (Signed)
Patient called into the cancer center to advise of pain she was having under her right breast.  The pain increases and travels down her right side when she takes a deep breath.  Patient is concerned with her having a history of breast cancer on the left side 3 years ago.  I spoke with Mike Craze, NP and a diagnostic mammogram with ultrasound was ordered.  I returned patients call and gave her appointment for mammogram tomorrow.  Patient verbalizes understanding.

## 2016-11-06 ENCOUNTER — Ambulatory Visit (HOSPITAL_COMMUNITY)
Admission: RE | Admit: 2016-11-06 | Discharge: 2016-11-06 | Disposition: A | Discharge status: Home / Self Care | Payer: Medicare Other | Source: Ambulatory Visit | Attending: Adult Health | Admitting: Adult Health

## 2016-11-06 DIAGNOSIS — N644 Mastodynia: Secondary | ICD-10-CM | POA: Insufficient documentation

## 2016-11-06 DIAGNOSIS — R928 Other abnormal and inconclusive findings on diagnostic imaging of breast: Secondary | ICD-10-CM | POA: Diagnosis not present

## 2016-11-08 ENCOUNTER — Other Ambulatory Visit (HOSPITAL_COMMUNITY): Payer: Self-pay | Admitting: Preventative Medicine

## 2016-11-08 ENCOUNTER — Ambulatory Visit (HOSPITAL_COMMUNITY)
Admission: RE | Admit: 2016-11-08 | Discharge: 2016-11-08 | Disposition: A | Discharge status: Home / Self Care | Payer: Medicare Other | Source: Ambulatory Visit | Attending: Preventative Medicine | Admitting: Preventative Medicine

## 2016-11-08 DIAGNOSIS — K760 Fatty (change of) liver, not elsewhere classified: Secondary | ICD-10-CM | POA: Diagnosis not present

## 2016-11-08 DIAGNOSIS — R109 Unspecified abdominal pain: Secondary | ICD-10-CM | POA: Diagnosis not present

## 2016-11-08 DIAGNOSIS — K838 Other specified diseases of biliary tract: Secondary | ICD-10-CM | POA: Insufficient documentation

## 2016-11-08 DIAGNOSIS — R1011 Right upper quadrant pain: Secondary | ICD-10-CM | POA: Insufficient documentation

## 2016-11-08 DIAGNOSIS — K8020 Calculus of gallbladder without cholecystitis without obstruction: Secondary | ICD-10-CM | POA: Insufficient documentation

## 2016-12-03 ENCOUNTER — Other Ambulatory Visit: Payer: Self-pay | Admitting: General Surgery

## 2016-12-03 DIAGNOSIS — K8020 Calculus of gallbladder without cholecystitis without obstruction: Secondary | ICD-10-CM | POA: Diagnosis not present

## 2016-12-09 ENCOUNTER — Encounter: Payer: Self-pay | Admitting: Neurology

## 2016-12-11 ENCOUNTER — Ambulatory Visit (INDEPENDENT_AMBULATORY_CARE_PROVIDER_SITE_OTHER): Payer: Medicare Other | Admitting: Neurology

## 2016-12-11 ENCOUNTER — Encounter: Payer: Self-pay | Admitting: Neurology

## 2016-12-11 VITALS — BP 134/83 | HR 77 | Ht 64.0 in | Wt 258.0 lb

## 2016-12-11 DIAGNOSIS — G4733 Obstructive sleep apnea (adult) (pediatric): Secondary | ICD-10-CM | POA: Diagnosis not present

## 2016-12-11 DIAGNOSIS — Z9989 Dependence on other enabling machines and devices: Secondary | ICD-10-CM

## 2016-12-11 NOTE — Patient Instructions (Signed)

## 2016-12-11 NOTE — Progress Notes (Signed)
Subjective:    Patient ID: Stephanie Jarvis is a 66 y.o. female.  HPI     Interim history:  Stephanie Jarvis is a 47 year old right-handed woman with an underlying medical history of paroxysmal A. Fib, breast cancer with history of recurrence, hyperlipidemia, vertigo, right foot pain, severe obesity, allergies, hypertension, depression, hyperlipidemia, and reflux disease, who presents for follow-up consultation of her moderate to severe obstructive sleep apnea, on treatment with home CPAP. The patient is unaccompanied today. I last saw her on 12/11/2015, at which time she was compliant with CPAP, was overall doing quite well with it, some mask discomfort. I suggested one-year checkup she was doing well from my end of things.  Today, 12/11/2016 (all dictated new, as well as above notes, some dictation done in note pad or Word, outside of chart, may appear as copied):  I reviewed her CPAP compliance data from 11/10/2016 through 12/09/2016 which is a total of 30 days, during which time she used her CPAP every night with percent used days greater than 4 hours at 100%, indicating superb compliance with an average usage of 9 hours and 12 minutes, which is quite extensive, average AHI 3.2 per hour, leak low with the 95th percentile at 4.4 L/m on a pressure of 10 cm with EPR of 3. She reports doing well, no issues with CPAP machine or supplies, DME [facility] which keeps her up to date with her supplies. She will have lab chole planned for 12/31/16. Still on Arimidex, likely for life she says.    The patient's allergies, current medications, family history, past medical history, past social history, past surgical history and problem list were reviewed and updated as appropriate.   Previously (copied from previous notes for reference):   I saw her on 06/12/2015, at which time she reported feeling better after CPAP therapy was initiated. She reported feeling more rested and less daytime somnolence. She  had adjusted fairly well. Overall, she was quite pleased with how she was doing.I did increase her CPAP pressure to 10 cm last time because of a borderline residual AHI at the time.   I reviewed her CPAP compliance data from 11/07/2015 through 12/06/2015 which is a total of 30 days during which time she used her machine 28 days with percent used days greater than 4 hours at 90%, indicating excellent compliance with an average usage of 7 hours and 53 minutes, residual AHI 2.6 per hour, leak low with the 95th percentile at 4.8 L/m on a pressure of 10 cm with EPR of 3.   I first met her on 02/10/2015 at the request of her primary care physician, at which time the patient reported snoring and excessive daytime somnolence for over 2 years. She was invited back for sleep study. She had a baseline sleep study, followed by a CPAP titration study, and I went over her test results with her in detail today. Her baseline sleep study from 03/09/2015 showed a sleep efficiency of 79.3% with a prolonged sleep latency of 56 minutes and wake after sleep onset of 43.5 minutes with mild to moderate sleep fragmentation noted. She had an elevated arousal index, primarily secondary to respiratory events. She had an increased percentage of stage II sleep, slow-wave sleep at 14.8%, and a decreased percentage of REM sleep at 7.7% with a mildly prolonged REM latency of 131 minutes. She had moderate PLMS with an index of 26.1 per hour, resulting in 8.2 arousals per hour. She had no significant EKG  or EEG changes. She had mild to moderate snoring. Total AHI was 21.8 per hour, rising to 30.5 per hour during REM sleep and 35.5 per hour in the supine position, average oxygen saturation was 94%, nadir was 74% during REM sleep. Based on her test results I invited her back for a CPAP titration study. She had this on 04/09/2015. Sleep efficiency was 78.2%, latency to sleep prolonged at 55.5 minutes and wake after sleep onset was 41.5 minutes with  mild to moderate sleep fragmentation noted. She had a mildly elevated arousal index, an increased percentage of stage II sleep, absence of slow-wave sleep and a decreased percentage of REM sleep at 50% with a mildly prolonged REM latency of 142 minutes. She had severe PLMS with an index of 73.3 per hour, resulting in 7.2 arousals per hour. She had no significant EKG or EEG changes. CPAP was titrated from 5 cm to 9 cm. AHI was 1.8 per hour at the final pressure. Average oxygen saturation was 92%, nadir was 85%. Based on her test results I prescribed CPAP therapy for home use.   I reviewed her CPAP compliance data from 05/09/2015 through 06/07/2015 which is a total of 30 days during which time she used her machine every night with percent used days greater than 4 hours at 100%, indicating superb compliance with an average usage of 8 hours and 50 minutes, residual AHI borderline at 4.8 per hour, leak low with the 95th percentile at 2.9 L/m and a pressure of 9 cm with EPR of 3.   02/10/2015: She reports snoring and excessive daytime somnolence. Symptoms have been ongoing for 2+ plus years. She moved in with [name] in 4/16. She quit smoking in 1999 and drinks alcohol occasionally and drinks caffeine in the form of coffee, 2 cups in the morning and 2 in the afternoon. She has a TV in her bedroom but does not typically watch it. She is a restless sleeper. She has woken herself up with a sense of gasping and [name] has noted pauses in her breathing. She denies morning headaches but endorses nocturia, typically 2-3 times each night. She goes to bed around 10 PM and falling asleep is not a problem. She has several nighttime awakenings but is able to go back to sleep. Her rise time is usually between 7 and 8 AM. She does not set an alarm. She is retired. 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 Her mother had obstructive sleep apnea. She endorses some restless leg symptoms and is known to move a lot in her  sleep. Snoring can be allowed per [name]. She does not wake up well rested. Her Epworth sleepiness score is 14 out of 24 today, fatigue score is 51 out of 63 today. She has right ankle pain. She takes oxycodone as needed, not daily.  Her Past Medical History Is Significant For: Past Medical History:  Diagnosis Date  . Ankle pain   . Anxiety   . Arthritis   . Breast cancer ([facility]) 2008   ILC of Left breast; ER+  . 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 Complication of anesthesia    Aspirates when having per patient  . Dysrhythmia 2011   Atrial Fibrillation  . Family history of anesthesia complication   . GERD (gastroesophageal reflux disease)   . H/O hiatal hernia 2003  . Hepatitis    Hepatitis A when 65 years old  . High  cholesterol   . Irregular heartbeat   . Knee pain   . Sciatica   . Snoring   . Vertigo     Her Past Surgical History Is Significant For: Past Surgical History:  Procedure Laterality Date  . BREAST LUMPECTOMY Left 1982,2008   x 2   . HIATAL HERNIA REPAIR  2003  . lymph node removal  2008  . MASTECTOMY MODIFIED RADICAL Left 01/26/2014   Procedure: LEFT MODIFIED RADICAL MASTECTOMY;  Surgeon: Jamesetta So, MD;  Location: [facility];  Service: General;  Laterality: Left;  . TOTAL KNEE ARTHROPLASTY  2006  . TOTAL KNEE ARTHROPLASTY  08/12/2011   Procedure: TOTAL KNEE ARTHROPLASTY;  Surgeon: Arther Abbott, MD;  Location: [facility];  Service: Orthopedics;  Laterality: Left;  . TUBAL LIGATION  1980    Her Family History Is Significant For: Family History  Problem Relation Age of Onset  . Stroke Father   . Heart disease Father   . Skin cancer Sister        dx. 57-58; unknown type  . Throat cancer Maternal Uncle 75       smoker; voicebox removed  . Alzheimer's disease Paternal Grandmother   . Heart attack Paternal Grandfather   . Alzheimer's disease Maternal Aunt   . Heart disease Maternal Uncle   . Lung cancer Cousin         smoker  . Lung cancer Paternal Aunt        dx. mid-70s; worked at [employer]  . Heart attack Daughter        caused by thyroid issues  . Heart disease Unknown   . Arthritis Unknown   . Lung disease Unknown   . Cancer Unknown   . Kidney disease Unknown     Her Social History Is Significant For: Social History   Social History  . Marital status: Widowed    Spouse name: N/A  . Number of children: 2  . Years of education: 12   Occupational History  . retired    Social History Main Topics  . Smoking status: Former Smoker    Packs/day: 3.00    Years: 25.00    Types: Cigarettes    Quit date: 06/30/1998  . Smokeless tobacco: Never Used  . Alcohol use 0.0 oz/week     Comment: 2 glasses wine per month  . Drug use: No  . Sexual activity: Yes    Birth control/ protection: Surgical   Other Topics Concern  . None   Social History Narrative   Drinks 2-3 cups of coffee a day     Her Allergies Are:  No Known Allergies:   Her Current Medications Are:  Outpatient Encounter Prescriptions as of 12/11/2016  Medication Sig  . anastrozole (ARIMIDEX) 1 MG tablet Take 1 tablet (1 mg total) by mouth daily.  Marland Kitchen buPROPion (WELLBUTRIN XL) 300 MG 24 hr tablet Take 300 mg by mouth daily.  . calcium-vitamin D (OSCAL WITH D) 500-200 MG-UNIT per tablet Take 1 tablet by mouth daily with breakfast.  . fexofenadine (ALLEGRA) 180 MG tablet Take 180 mg by mouth daily.  . furosemide (LASIX) 40 MG tablet Take 40 mg by mouth daily.  Marland Kitchen ibuprofen (ADVIL,MOTRIN) 200 MG tablet Take 400 mg by mouth every 6 (six) hours as needed for moderate pain.  Marland Kitchen KLOR-CON M20 20 MEQ tablet TAKE 1 TABLET TWICE A DAY  . meloxicam (MOBIC) 7.5 MG tablet Take 7.5 mg by mouth daily.  . metoprolol tartrate (LOPRESSOR) 25 MG  tablet Take 12.5 mg by mouth 2 (two) times daily. Half a tablet twice daily  . Multiple Vitamins-Minerals (MULTIVITAMIN PO) Take 1 tablet by mouth daily.   Marland Kitchen omeprazole (PRILOSEC) 20 MG capsule  Take 20 mg by mouth 2 (two) times daily.   Marland Kitchen PREMARIN vaginal cream PLACE 0.5 TO 1 GRAM VAGINALLY THREE TIMES A WEEK  . PROVENTIL HFA 108 (90 Base) MCG/ACT inhaler   . rosuvastatin (CRESTOR) 20 MG tablet   . sertraline (ZOLOFT) 100 MG tablet Take 100 mg by mouth at bedtime.   No facility-administered encounter medications on file as of 12/11/2016.   : Review of Systems:  Out of a complete 14 point review of systems, all are reviewed and negative with the exception of these symptoms as listed below: Review of Systems  Neurological:       Pt presents today for f/u for CPAP. Doing well and [facility] is sending her supplies. No complaints.    Objective:  Neurologic Exam  Physical Exam Physical Examination:   Vitals:   12/11/16 0810  BP: 134/83  Pulse: 77   General Examination: The patient is a very pleasant 66 y.o. female in no acute distress. She appears well-developed and well-nourished and well groomed.   HEENT: Normocephalic, atraumatic, pupils are equal, round and reactive to light and accommodation. Extraocular tracking is good without limitation to gaze excursion or nystagmus noted. Normal smooth pursuit is noted. Hearing is grossly intact. Face is symmetric with normal facial animation and normal facial sensation. Speech is clear with no dysarthria noted. There is no hypophonia. There is no lip, neck/head, jaw or voice tremor. Neck is supple with full range of passive and active motion. There are no carotid bruits on auscultation. Oropharynx exam reveals: moderate mouth dryness, adequate dental hygiene and moderate airway crowding. Mallampati is class II. Tongue protrudes centrally and palate elevates symmetrically. Nasal inspection reveals no significant nasal mucosal bogginess or redness and no septal deviation.   Chest: Clear to auscultation without wheezing, rhonchi or crackles noted.  Heart: S1+S2+0, regular and normal without murmurs, rubs or gallops noted.   Abdomen: Soft,  non-tender and non-distended with normal bowel sounds appreciated on auscultation.  Extremities: There is no pitting edema in the distal lower extremities bilaterally. Pedal pulses are intact.  Skin: Warm and dry without trophic changes noted. There are no varicose veins.  Musculoskeletal: exam reveals no obvious joint deformities, tenderness or joint swelling or erythema. she reports right ankle pain. She has a elastic sleeve over the right ankle.   Neurologically:  Mental status: The patient is awake, alert and oriented in all 4 spheres. Her immediate and remote memory, attention, language skills and fund of knowledge are appropriate. There is no evidence of aphasia, agnosia, apraxia or anomia. Speech is clear with normal prosody and enunciation. Thought process is linear. Mood is normal and affect is normal.  Cranial nerves II - XII are as described above under HEENT exam. In addition: shoulder shrug is normal with equal shoulder height noted. Motor exam: Normal bulk, strength and tone is noted. There is no tremor. Romberg is negative. Reflexes are 1+ throughout. Fine motor skills and coordination: intact grossly.   Cerebellar testing: No dysmetria or intention tremor on finger to nose testing. There is no truncal or gait ataxia.  Sensory exam: intact to light touch in the upper and lower extremities.  Gait, station and balance: She stands easily. No veering to one side is noted. No leaning to one side is noted.  Posture is age-appropriate and stance is narrow based. Gait shows normal stride length and normal pace. No problems turning are noted. Toes point outwards b/l.   Assessment and Plan:  In summary, Stephanie Jarvis is a very pleasant 66 year old female with an underlying medical history of paroxysmal A. Fib, breast cancer, with history of recurrence, hyperlipidemia, vertigo, right foot pain, severe obesity, allergies, hypertension, depression, hyperlipidemia, and reflux disease, who  presents for follow-up consultation of her obstructive sleep apnea, which was moderate to severe. She had a baseline sleep study on 03/09/2015. She has established treatment with CPAP. We increase her pressure to 10 cm in 2016 and since then her AHI is at goal, with water. She is using a nasal mask. I suggested a one-year checkup, she is up To date with her supplies through [facility]. Physical exam is stable with the exception of some weight gain, she is reminded to work on weight loss. She is advised to be better hydrated with water.  I answered all her questions today and  She was in agreement. I spent 15 minutes in total face-to-face time with the patient, more than 50% of which was spent in counseling and coordination of care, reviewing test results, reviewing medication and discussing or reviewing the diagnosis of OSA, its prognosis and treatment options. Pertinent laboratory and imaging test results that were available during this visit with the patient were reviewed by me and considered in my medical decision making (see chart for details).

## 2016-12-16 ENCOUNTER — Ambulatory Visit (HOSPITAL_COMMUNITY): Payer: Medicare Other

## 2016-12-23 NOTE — Pre-Procedure Instructions (Signed)
Stephanie Jarvis  12/23/2016      [id number] - [place], [place] - [address] [place] [address] Phone: [phone] Fax: [phone]  [place], [place] [address] Phone: [phone] Fax: [phone]  [facility] [place], [place] [address] [place] [address] Phone: [phone] Fax: [phone]  [facility] [place], [place] [address] [place] [place] [address] Phone: [phone] Fax: [phone]   Your procedure is scheduled on Tues. December 31, 2016 at 1020 AM. Report to [facility] Admitting at 820 AM.  Call this number if you have problems the morning of surgery:[phone]   Remember:  Do not eat food or drink liquids after midnight December 30, 2016.  Take these medicines the morning of surgery with A SIP OF WATER anastrozole (arimidex), baclofen (lioresal)-if needed, bupropion (wellbutrin), fexofenadine (allegra), metoprolol (lopressor), omeprazole (prilosec),  proventil HFA inhaler <bring inhaler with you>, sertraline (zoloft).  7 days prior to surgery STOP taking any Mobic, Aspirin, Aleve, Naproxen, Ibuprofen, Motrin, Advil, Goody's, BC's, all herbal medications, fish oil, and all vitamins.   Do not wear jewelry, make-up or nail polish.  Do not wear lotions, powders, or perfumes, or deoderant.  Do not shave 48 hours prior to surgery.    Do not bring valuables to the hospital.  [facility] is not responsible for any belongings or valuables.  Contacts, dentures or bridgework may not be worn into surgery.  Leave your suitcase in the car.  After surgery it may be brought to your room.  For patients admitted to the hospital, discharge time will be determined by your treatment team.  Patients  discharged the day of surgery will not be allowed to drive home.    Special instructions:   Stephanie Jarvis- Preparing For Surgery  Before surgery, you can play an important role. Because skin is not sterile, your skin needs to be as free of germs as possible. You can reduce the number of germs on your skin by washing with CHG (chlorahexidine gluconate) Soap before surgery.  CHG is an antiseptic cleaner which kills germs and bonds with the skin to continue killing germs even after washing.  Please do not use if you have an allergy to CHG or antibacterial soaps. If your skin becomes reddened/irritated stop using the CHG.  Do not shave (including legs and underarms) for at least 48 hours prior to first CHG shower. It is OK to shave your face.  Please follow these instructions carefully.   1. Shower the NIGHT BEFORE SURGERY and the MORNING OF SURGERY with CHG.   2. If you chose to wash your hair, wash your hair first as usual with your normal shampoo.  3. After you shampoo, rinse your hair and body thoroughly to remove the shampoo.  4. Use CHG as you would any other liquid soap. You can apply CHG directly to the skin and wash gently with a scrungie or a clean washcloth.   5. Apply the CHG Soap to your body ONLY FROM THE NECK DOWN.  Do not use on open wounds or open sores. Avoid contact with your eyes, ears, mouth and genitals (private parts). Wash genitals (private parts) with your normal soap.  6. Wash thoroughly, paying special attention to the area where your surgery will be performed.  7. Thoroughly rinse your body with warm water from the neck down.  8. DO NOT shower/wash with your normal soap after using and rinsing off the CHG Soap.  9. Pat yourself dry with a CLEAN TOWEL.   10. Wear CLEAN PAJAMAS   11. Place CLEAN SHEETS on your bed the night of your first shower and DO NOT SLEEP WITH PETS.    Day of Surgery: Do not apply any deodorants/lotions. Please wear clean clothes to the  hospital/surgery center.     Please read over the following fact sheets that you were given. Pain Booklet, Coughing and Deep Breathing and Surgical Site Infection Prevention

## 2016-12-24 ENCOUNTER — Encounter (HOSPITAL_COMMUNITY): Payer: Self-pay

## 2016-12-24 ENCOUNTER — Encounter (HOSPITAL_COMMUNITY)
Admission: RE | Admit: 2016-12-24 | Discharge: 2016-12-24 | Disposition: A | Discharge status: Home / Self Care | Payer: Medicare Other | Source: Ambulatory Visit | Attending: General Surgery | Admitting: General Surgery

## 2016-12-24 DIAGNOSIS — Z01812 Encounter for preprocedural laboratory examination: Secondary | ICD-10-CM | POA: Insufficient documentation

## 2016-12-24 HISTORY — DX: Pneumonia, unspecified organism: J18.9

## 2016-12-24 HISTORY — DX: Personal history of other medical treatment: Z92.89

## 2016-12-24 LAB — BASIC METABOLIC PANEL
Anion gap: 8 (ref 5–15)
BUN: 14 mg/dL (ref 6–20)
CALCIUM: 9.5 mg/dL (ref 8.9–10.3)
CHLORIDE: 104 mmol/L (ref 101–111)
CO2: 27 mmol/L (ref 22–32)
CREATININE: 0.89 mg/dL (ref 0.44–1.00)
GFR calc Af Amer: >60 mL/min (ref >60)
GFR calc non Af Amer: >60 mL/min (ref >60)
GLUCOSE: 114 mg/dL — AB (ref 65–99)
Potassium: 4.3 mmol/L (ref 3.5–5.1)
Sodium: 139 mmol/L (ref 135–145)

## 2016-12-24 LAB — CBC
HCT: 38.3 % (ref 36.0–46.0)
Hemoglobin: 12.8 g/dL (ref 12.0–15.0)
MCH: 30.9 pg (ref 26.0–34.0)
MCHC: 33.4 g/dL (ref 30.0–36.0)
MCV: 92.5 fL (ref 78.0–100.0)
PLATELETS: 236 10*3/uL (ref 150–400)
RBC: 4.14 MIL/uL (ref 3.87–5.11)
RDW: 13.5 % (ref 11.5–15.5)
WBC: 6.7 10*3/uL (ref 4.0–10.5)

## 2016-12-24 NOTE — Progress Notes (Signed)
Ms Bogdanski has a history of PAF, patient is on Metoprolol, " it has been regular since I started taking the medication."  Patient no longer sees a cardiologist.

## 2016-12-24 NOTE — Pre-Procedure Instructions (Signed)
Stephanie Jarvis  12/24/2016    Your procedure is scheduled on Tues. December 31, 2016 at 1020 AM. Report to [facility] Admitting at 820 AM.  Call this number if you have problems the morning of surgery:[phone]   Remember:  Do not eat food or drink liquids after midnight December 30, 2016.  Take these medicines the morning of surgery with A SIP OF WATER anastrozole (arimidex),bupropion (wellbutrin), fexofenadine (allegra), metoprolol (lopressor), omeprazole (prilosec), sertraline (zoloft). If needed, baclofen (lioresal), proventil HFA inhaler <bring inhaler with you>, sertraline (zoloft).  7 days prior to surgery STOP taking any Mobic, Aspirin, Aleve, Naproxen, Ibuprofen, Motrin, Advil, Goody's, BC's, all herbal medications, fish oil, and all vitamins.   Do not wear jewelry, make-up or nail polish.  Do not wear lotions, powders, or perfumes, or deoderant.  Do not shave 48 hours prior to surgery.    Do not bring valuables to the hospital.  [facility] is not responsible for any belongings or valuables.  Contacts, dentures or bridgework may not be worn into surgery.  Leave your suitcase in the car.  After surgery it may be brought to your room.  For patients admitted to the hospital, discharge time will be determined by your treatment team.  Patients discharged the day of surgery will not be allowed to drive home.    Special instructions:   [facility]- Preparing For Surgery  Before surgery, you can play an important role. Because skin is not sterile, your skin needs to be as free of germs as possible. You can reduce the number of germs on your skin by washing with CHG (chlorahexidine gluconate) Soap before surgery.  CHG is an antiseptic cleaner which kills germs and bonds with the skin to continue killing germs even after washing.  Please do not use if you have an allergy to CHG or antibacterial soaps. If your skin becomes reddened/irritated stop using the CHG.  Do not  shave (including legs and underarms) for at least 48 hours prior to first CHG shower. It is OK to shave your face.  Please follow these instructions carefully.   1. Shower the NIGHT BEFORE SURGERY and the MORNING OF SURGERY with CHG.   2. If you chose to wash your hair, wash your hair first as usual with your normal shampoo.  3. After you shampoo, rinse your hair and body thoroughly to remove the shampoo. Wash your face and private areas with your soap, rinse.  4. Use CHG as you would any other liquid soap. You can apply CHG directly to the skin and wash gently with a scrungie or a clean washcloth.   5. Apply the CHG Soap to your body ONLY FROM THE NECK DOWN.  Do not use on open wounds or open sores. Avoid contact with your eyes, ears, mouth and genitals (private parts). Wash genitals (private parts) with your normal soap.  6. Wash thoroughly, paying special attention to the area where your surgery will be performed.  7. Thoroughly rinse your body with warm water from the neck down.  8. DO NOT shower/wash with your normal soap after using and rinsing off the CHG Soap.  9. Pat yourself dry with a CLEAN TOWEL.   10. Wear CLEAN PAJAMAS   11. Place CLEAN SHEETS on your bed the night of your first shower and DO NOT SLEEP WITH PETS.   Day of Surgery: Shower as above. Do not apply any deodorants/lotions. Please wear clean clothes to the hospital/surgery center.  Please read over the following fact sheets that you were given. Pain Booklet, Coughing and Deep Breathing and Surgical Site Infection Prevention

## 2016-12-30 MED ORDER — DEXTROSE 5 % IV SOLN
3.0000 g | INTRAVENOUS | Status: AC
  Administered 2016-12-31: 3 g via INTRAVENOUS
  Filled 2016-12-30: qty 3

## 2016-12-31 ENCOUNTER — Encounter (HOSPITAL_COMMUNITY)
Admission: RE | Disposition: A | Discharge status: Home / Self Care | Payer: Self-pay | Source: Ambulatory Visit | Attending: General Surgery

## 2016-12-31 ENCOUNTER — Ambulatory Visit (HOSPITAL_COMMUNITY)
Admission: RE | Admit: 2016-12-31 | Discharge: 2016-12-31 | Disposition: A | Discharge status: Home / Self Care | Payer: Medicare Other | Source: Ambulatory Visit | Attending: General Surgery | Admitting: General Surgery

## 2016-12-31 ENCOUNTER — Ambulatory Visit (HOSPITAL_COMMUNITY): Payer: Medicare Other | Admitting: Anesthesiology

## 2016-12-31 ENCOUNTER — Encounter (HOSPITAL_COMMUNITY): Payer: Self-pay

## 2016-12-31 DIAGNOSIS — Z811 Family history of alcohol abuse and dependence: Secondary | ICD-10-CM | POA: Diagnosis not present

## 2016-12-31 DIAGNOSIS — Z823 Family history of stroke: Secondary | ICD-10-CM | POA: Insufficient documentation

## 2016-12-31 DIAGNOSIS — G4730 Sleep apnea, unspecified: Secondary | ICD-10-CM | POA: Insufficient documentation

## 2016-12-31 DIAGNOSIS — M25676 Stiffness of unspecified foot, not elsewhere classified: Secondary | ICD-10-CM | POA: Diagnosis not present

## 2016-12-31 DIAGNOSIS — Z8249 Family history of ischemic heart disease and other diseases of the circulatory system: Secondary | ICD-10-CM | POA: Diagnosis not present

## 2016-12-31 DIAGNOSIS — F419 Anxiety disorder, unspecified: Secondary | ICD-10-CM | POA: Diagnosis not present

## 2016-12-31 DIAGNOSIS — Z79899 Other long term (current) drug therapy: Secondary | ICD-10-CM | POA: Diagnosis not present

## 2016-12-31 DIAGNOSIS — E7800 Pure hypercholesterolemia, unspecified: Secondary | ICD-10-CM | POA: Insufficient documentation

## 2016-12-31 DIAGNOSIS — K219 Gastro-esophageal reflux disease without esophagitis: Secondary | ICD-10-CM | POA: Insufficient documentation

## 2016-12-31 DIAGNOSIS — Z853 Personal history of malignant neoplasm of breast: Secondary | ICD-10-CM | POA: Diagnosis not present

## 2016-12-31 DIAGNOSIS — Z818 Family history of other mental and behavioral disorders: Secondary | ICD-10-CM | POA: Diagnosis not present

## 2016-12-31 DIAGNOSIS — K8010 Calculus of gallbladder with chronic cholecystitis without obstruction: Secondary | ICD-10-CM | POA: Diagnosis not present

## 2016-12-31 DIAGNOSIS — Z8349 Family history of other endocrine, nutritional and metabolic diseases: Secondary | ICD-10-CM | POA: Insufficient documentation

## 2016-12-31 DIAGNOSIS — M76829 Posterior tibial tendinitis, unspecified leg: Secondary | ICD-10-CM | POA: Diagnosis not present

## 2016-12-31 DIAGNOSIS — Z87891 Personal history of nicotine dependence: Secondary | ICD-10-CM | POA: Insufficient documentation

## 2016-12-31 DIAGNOSIS — K8020 Calculus of gallbladder without cholecystitis without obstruction: Secondary | ICD-10-CM | POA: Diagnosis not present

## 2016-12-31 DIAGNOSIS — C50919 Malignant neoplasm of unspecified site of unspecified female breast: Secondary | ICD-10-CM | POA: Diagnosis not present

## 2016-12-31 HISTORY — PX: CHOLECYSTECTOMY: SHX55

## 2016-12-31 SURGERY — LAPAROSCOPIC CHOLECYSTECTOMY WITH INTRAOPERATIVE CHOLANGIOGRAM
Anesthesia: General | Site: Abdomen

## 2016-12-31 MED ORDER — GABAPENTIN 300 MG PO CAPS
300.0000 mg | ORAL_CAPSULE | ORAL | Status: AC
  Administered 2016-12-31: 300 mg via ORAL
  Filled 2016-12-31: qty 1

## 2016-12-31 MED ORDER — ROCURONIUM BROMIDE 100 MG/10ML IV SOLN
INTRAVENOUS | Status: DC | PRN
  Administered 2016-12-31: 50 mg via INTRAVENOUS

## 2016-12-31 MED ORDER — EPHEDRINE SULFATE-NACL 50-0.9 MG/10ML-% IV SOSY
PREFILLED_SYRINGE | INTRAVENOUS | Status: DC | PRN
  Administered 2016-12-31 (×3): 5 mg via INTRAVENOUS

## 2016-12-31 MED ORDER — BUPIVACAINE-EPINEPHRINE 0.25% -1:200000 IJ SOLN
INTRAMUSCULAR | Status: DC | PRN
  Administered 2016-12-31: 12 mL

## 2016-12-31 MED ORDER — OXYCODONE HCL 5 MG PO TABS
5.0000 mg | ORAL_TABLET | Freq: Once | ORAL | Status: AC | PRN
  Administered 2016-12-31: 5 mg via ORAL

## 2016-12-31 MED ORDER — SUGAMMADEX SODIUM 500 MG/5ML IV SOLN
INTRAVENOUS | Status: DC | PRN
  Administered 2016-12-31: 240 mg via INTRAVENOUS

## 2016-12-31 MED ORDER — BUPIVACAINE-EPINEPHRINE (PF) 0.25% -1:200000 IJ SOLN
INTRAMUSCULAR | Status: AC
  Filled 2016-12-31: qty 30

## 2016-12-31 MED ORDER — FENTANYL CITRATE (PF) 250 MCG/5ML IJ SOLN
INTRAMUSCULAR | Status: AC
  Filled 2016-12-31: qty 5

## 2016-12-31 MED ORDER — OXYCODONE HCL 5 MG PO TABS
ORAL_TABLET | ORAL | Status: AC
Start: 2016-12-31 — End: 2016-12-31
  Administered 2016-12-31: 5 mg via ORAL
  Filled 2016-12-31: qty 1

## 2016-12-31 MED ORDER — ONDANSETRON HCL 4 MG/2ML IJ SOLN
INTRAMUSCULAR | Status: AC
  Filled 2016-12-31: qty 2

## 2016-12-31 MED ORDER — 0.9 % SODIUM CHLORIDE (POUR BTL) OPTIME
TOPICAL | Status: DC | PRN
  Administered 2016-12-31: 1000 mL

## 2016-12-31 MED ORDER — ACETAMINOPHEN 500 MG PO TABS
1000.0000 mg | ORAL_TABLET | ORAL | Status: AC
  Administered 2016-12-31: 1000 mg via ORAL
  Filled 2016-12-31: qty 2

## 2016-12-31 MED ORDER — SODIUM CHLORIDE 0.9 % IR SOLN
Status: DC | PRN
  Administered 2016-12-31: 1000 mL

## 2016-12-31 MED ORDER — MIDAZOLAM HCL 2 MG/2ML IJ SOLN
INTRAMUSCULAR | Status: AC
  Filled 2016-12-31: qty 2

## 2016-12-31 MED ORDER — MIDAZOLAM HCL 5 MG/5ML IJ SOLN
INTRAMUSCULAR | Status: DC | PRN
  Administered 2016-12-31: 2 mg via INTRAVENOUS

## 2016-12-31 MED ORDER — FENTANYL CITRATE (PF) 100 MCG/2ML IJ SOLN
INTRAMUSCULAR | Status: AC
Start: 2016-12-31 — End: 2016-12-31
  Administered 2016-12-31: 50 ug via INTRAVENOUS
  Filled 2016-12-31: qty 2

## 2016-12-31 MED ORDER — ONDANSETRON HCL 4 MG/2ML IJ SOLN: 4.0000 mg | Freq: Once | INTRAMUSCULAR | Status: DC | PRN

## 2016-12-31 MED ORDER — IOPAMIDOL (ISOVUE-300) INJECTION 61%
INTRAVENOUS | Status: AC
  Filled 2016-12-31: qty 50

## 2016-12-31 MED ORDER — FENTANYL CITRATE (PF) 250 MCG/5ML IJ SOLN
INTRAMUSCULAR | Status: DC | PRN
  Administered 2016-12-31: 100 ug via INTRAVENOUS
  Administered 2016-12-31: 50 ug via INTRAVENOUS
  Administered 2016-12-31: 100 ug via INTRAVENOUS

## 2016-12-31 MED ORDER — PROPOFOL 10 MG/ML IV BOLUS
INTRAVENOUS | Status: DC | PRN
  Administered 2016-12-31: 150 mg via INTRAVENOUS

## 2016-12-31 MED ORDER — OXYCODONE-ACETAMINOPHEN 10-325 MG PO TABS: 1.0000 | ORAL_TABLET | Freq: Four times a day (QID) | ORAL | 0 refills | Status: DC | PRN

## 2016-12-31 MED ORDER — LIDOCAINE HCL (CARDIAC) 20 MG/ML IV SOLN
INTRAVENOUS | Status: DC | PRN
  Administered 2016-12-31: 100 mg via INTRATRACHEAL

## 2016-12-31 MED ORDER — PHENYLEPHRINE HCL 10 MG/ML IJ SOLN
INTRAMUSCULAR | Status: DC | PRN
  Administered 2016-12-31: 40 ug via INTRAVENOUS
  Administered 2016-12-31: 80 ug via INTRAVENOUS
  Administered 2016-12-31: 40 ug via INTRAVENOUS

## 2016-12-31 MED ORDER — PROPOFOL 10 MG/ML IV BOLUS
INTRAVENOUS | Status: AC
  Filled 2016-12-31: qty 20

## 2016-12-31 MED ORDER — FENTANYL CITRATE (PF) 100 MCG/2ML IJ SOLN
25.0000 ug | INTRAMUSCULAR | Status: DC | PRN
Start: 2016-12-31 — End: 2016-12-31
  Administered 2016-12-31 (×2): 50 ug via INTRAVENOUS

## 2016-12-31 MED ORDER — LACTATED RINGERS IV SOLN
INTRAVENOUS | Status: DC
  Administered 2016-12-31 (×2): via INTRAVENOUS

## 2016-12-31 MED ORDER — ONDANSETRON HCL 4 MG/2ML IJ SOLN
INTRAMUSCULAR | Status: DC | PRN
  Administered 2016-12-31: 4 mg via INTRAVENOUS

## 2016-12-31 MED ORDER — EPHEDRINE 5 MG/ML INJ
INTRAVENOUS | Status: AC
  Filled 2016-12-31: qty 10

## 2016-12-31 MED ORDER — OXYCODONE HCL 5 MG/5ML PO SOLN: 5.0000 mg | Freq: Once | ORAL | Status: AC | PRN

## 2016-12-31 SURGICAL SUPPLY — 39 items
APPLIER CLIP 5 13 M/L LIGAMAX5 (MISCELLANEOUS) ×3
BLADE CLIPPER SURG (BLADE) IMPLANT
CANISTER SUCT 3000ML PPV (MISCELLANEOUS) ×3 IMPLANT
CHLORAPREP W/TINT 26ML (MISCELLANEOUS) ×3 IMPLANT
CLIP APPLIE 5 13 M/L LIGAMAX5 (MISCELLANEOUS) ×1 IMPLANT
CLOSURE WOUND 1/2 X4 (GAUZE/BANDAGES/DRESSINGS) ×1
COVER MAYO STAND STRL (DRAPES) ×3 IMPLANT
COVER SURGICAL LIGHT HANDLE (MISCELLANEOUS) ×3 IMPLANT
DERMABOND ADVANCED (GAUZE/BANDAGES/DRESSINGS) ×2
DERMABOND ADVANCED .7 DNX12 (GAUZE/BANDAGES/DRESSINGS) ×1 IMPLANT
DEVICE TROCAR PUNCTURE CLOSURE (ENDOMECHANICALS) ×3 IMPLANT
DRAPE C-ARM 42X72 X-RAY (DRAPES) ×3 IMPLANT
ELECT REM PT RETURN 9FT ADLT (ELECTROSURGICAL) ×3
ELECTRODE REM PT RTRN 9FT ADLT (ELECTROSURGICAL) ×1 IMPLANT
GLOVE BIO SURGEON STRL SZ7 (GLOVE) ×3 IMPLANT
GLOVE BIOGEL PI IND STRL 7.5 (GLOVE) ×1 IMPLANT
GLOVE BIOGEL PI INDICATOR 7.5 (GLOVE) ×2
GOWN STRL REUS W/ TWL LRG LVL3 (GOWN DISPOSABLE) ×3 IMPLANT
GOWN STRL REUS W/TWL LRG LVL3 (GOWN DISPOSABLE) ×6
KIT BASIN OR (CUSTOM PROCEDURE TRAY) ×3 IMPLANT
KIT ROOM TURNOVER OR (KITS) ×3 IMPLANT
NS IRRIG 1000ML POUR BTL (IV SOLUTION) ×3 IMPLANT
PAD ARMBOARD 7.5X6 YLW CONV (MISCELLANEOUS) ×3 IMPLANT
POUCH RETRIEVAL ECOSAC 10 (ENDOMECHANICALS) ×1 IMPLANT
POUCH RETRIEVAL ECOSAC 10MM (ENDOMECHANICALS) ×2
SCISSORS LAP 5X35 DISP (ENDOMECHANICALS) ×3 IMPLANT
SET CHOLANGIOGRAPH 5 50 .035 (SET/KITS/TRAYS/PACK) ×3 IMPLANT
SET IRRIG TUBING LAPAROSCOPIC (IRRIGATION / IRRIGATOR) ×3 IMPLANT
SLEEVE ENDOPATH XCEL 5M (ENDOMECHANICALS) ×6 IMPLANT
SPECIMEN JAR SMALL (MISCELLANEOUS) ×3 IMPLANT
STRIP CLOSURE SKIN 1/2X4 (GAUZE/BANDAGES/DRESSINGS) ×2 IMPLANT
SUT MNCRL AB 4-0 PS2 18 (SUTURE) ×3 IMPLANT
SUT VICRYL 0 UR6 27IN ABS (SUTURE) ×6 IMPLANT
TOWEL OR 17X24 6PK STRL BLUE (TOWEL DISPOSABLE) ×3 IMPLANT
TOWEL OR 17X26 10 PK STRL BLUE (TOWEL DISPOSABLE) ×3 IMPLANT
TRAY LAPAROSCOPIC MC (CUSTOM PROCEDURE TRAY) ×3 IMPLANT
TROCAR XCEL BLUNT TIP 100MML (ENDOMECHANICALS) ×3 IMPLANT
TROCAR XCEL NON-BLD 5MMX100MML (ENDOMECHANICALS) ×3 IMPLANT
TUBING INSUFFLATION (TUBING) ×3 IMPLANT

## 2016-12-31 NOTE — Anesthesia Procedure Notes (Signed)
Procedure Name: Intubation Date/Time: 12/31/2016 10:42 AM Performed by: Mariea Clonts Pre-anesthesia Checklist: Patient identified, Emergency Drugs available, Suction available and Patient being monitored Patient Re-evaluated:Patient Re-evaluated prior to inductionOxygen Delivery Method: Circle System Utilized Preoxygenation: Pre-oxygenation with 100% oxygen Intubation Type: IV induction and Cricoid Pressure applied Ventilation: Mask ventilation without difficulty and Oral airway inserted - appropriate to patient size Laryngoscope Size: Sabra Heck and 2 Grade View: Grade II Tube type: Oral Tube size: 7.0 mm Number of attempts: 1 Airway Equipment and Method: Stylet and Oral airway Placement Confirmation: ETT inserted through vocal cords under direct vision,  positive ETCO2 and breath sounds checked- equal and bilateral Tube secured with: Tape Dental Injury: Teeth and Oropharynx as per pre-operative assessment

## 2016-12-31 NOTE — Op Note (Signed)
Preoperative diagnosis:symptomatic cholelithiasis Postoperative diagnosis: saa Procedure: Laparoscopic cholecystectomy Surgeon: Dr. Serita Grammes Anesthesia: Gen. Specimens: gb to pathology Estimated blood loss: minimal Complications: None Drains: none Sponge count was correct at completion Disposition to recovery stable  Indications: This is a 70 yof with symptomatic cholelithiasis. We discussed lap chole.  Procedure: After informed consent was obtained the patient was taken to the operating room. She was given antibiotics. SCDs were in place. She was placed undergeneral anesthesia without complication. Herabdomen was prepped and draped in the standard sterile surgical fashion. A surgical timeout was then performed.  I then infiltrated Marcaine below the umbilicus. I made a vertical incision. I identified the fascia incised sharply. I entered into the peritoneum bluntly. There is no evidence of an entry injury. I then placed a 0 Vicryl pursestring suture. I then inserted a Hassan trocar and insufflated to 15 mmHg pressure. I then placed a 5 mm trocar in theepigastrium.Two5 mm trocars were placed in the right side of the abdomen. I grasped the gallbladder and retracted cephalad. 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 I was able to identify the critical view of safety. I identified the cystic artery. I divided this with two clips remaining in place. I proceeded to address the cystic duct. I placed three clips and divided this. The cystic duct was viable and the clips completely traversed the duct. Two clips were left in place. I then removed the gallbladder from the liver bedI placed it in a bag and removed from the umbilicus. I obtained hemostasis.I then removed the [facility] trocar and tied the pursestring down. I did place two additional 2-0 Vicryl sutures at this area using the endoclose device. I then removed the remaining trocars and these were closed with 4-0  Monocryl and glue. She tolerated this well be transferred recovery room

## 2016-12-31 NOTE — Transfer of Care (Signed)
Immediate Anesthesia Transfer of Care Note  Patient: Stephanie Jarvis  Procedure(s) Performed: Procedure(s): LAPAROSCOPIC CHOLECYSTECTOMY WITH POSSIBLE INTRAOPERATIVE CHOLANGIOGRAM (N/A)  Patient Location: PACU  Anesthesia Type:General  Level of Consciousness: awake, alert  and oriented  Airway & Oxygen Therapy: Patient Spontanous Breathing and Patient connected to nasal cannula oxygen  Post-op Assessment: Report given to RN and Post -op Vital signs reviewed and stable  Post vital signs: Reviewed and stable  Last Vitals:  Vitals:   12/31/16 0828 12/31/16 1142  BP: (!) 161/64 (!) 165/74  Pulse: 75 74  Resp: 20 17  Temp: 36.8 C (P) 36.4 C    Last Pain:  Vitals:   12/31/16 0847  TempSrc:   PainSc: 2          Complications: No apparent anesthesia complications

## 2016-12-31 NOTE — Interval H&P Note (Signed)
History and Physical Interval Note:  12/31/2016 10:06 AM  Stephanie Jarvis  has presented today for surgery, with the diagnosis of gallstones  The various methods of treatment have been discussed with the patient and family. After consideration of risks, benefits and other options for treatment, the patient has consented to  Procedure(s): LAPAROSCOPIC CHOLECYSTECTOMY WITH POSSIBLE INTRAOPERATIVE CHOLANGIOGRAM (N/A) as a surgical intervention .  The patient's history has been reviewed, patient examined, no change in status, stable for surgery.  I have reviewed the patient's chart and labs.  Questions were answered to the patient's satisfaction.     [name]

## 2016-12-31 NOTE — Anesthesia Preprocedure Evaluation (Signed)
Anesthesia Evaluation  Patient identified by MRN, date of birth, ID band Patient awake    Reviewed: Allergy & Precautions, NPO status , Patient's Chart, lab work & pertinent test results  Airway Mallampati: II  TM Distance: >3 FB Neck ROM: Full    Dental  (+) Teeth Intact, Dental Advisory Given   Pulmonary former smoker,    breath sounds clear to auscultation       Cardiovascular  Rhythm:Regular Rate:Normal     Neuro/Psych    GI/Hepatic   Endo/Other    Renal/GU      Musculoskeletal   Abdominal (+) + obese,   Peds  Hematology   Anesthesia Other Findings   Reproductive/Obstetrics                            Anesthesia Physical Anesthesia Plan  ASA: III  Anesthesia Plan: General   Post-op Pain Management:    Induction: Intravenous  PONV Risk Score and Plan: Ondansetron and Dexamethasone  Airway Management Planned: Oral ETT  Additional Equipment:   Intra-op Plan:   Post-operative Plan: Extubation in OR  Informed Consent: I have reviewed the patients History and Physical, chart, labs and discussed the procedure including the risks, benefits and alternatives for the proposed anesthesia with the patient or authorized representative who has indicated his/her understanding and acceptance.     Dental advisory given  Plan Discussed with: CRNA and Anesthesiologist  Anesthesia Plan Comments:         Anesthesia Quick Evaluation  

## 2016-12-31 NOTE — H&P (Signed)
66 yof referred by Dr Sharilyn Sites for gallstones. she has history of breast cancer and has recently noted a right upper abdominal pain that is radiating to her back and shoulder. this was intermittent but is now daily and worse with food. she underwent mm initially that is normal. following this she has undergone an [place] that shows cholelithiasis, hepatic steatosis and some mild dilatation of cbd. lfts were normal in early march. she has no n/v, having bms normally. pain getting worse and no real relieving factors. she is here to discuss gallstones   Past Surgical History Illene Regulus, CMA; 12/03/2016 4:21 PM) Breast Biopsy  Left. Breast Mass; Local Excision  Left. Knee Surgery  Bilateral. Mastectomy  Left.  Diagnostic Studies History Illene Regulus, CMA; 12/03/2016 4:21 PM) Colonoscopy  5-10 years ago  Allergies Lars Mage Spillers, CMA; 12/03/2016 4:28 PM) No Known Drug Allergies 12/03/2016  Medication History (Alisha Spillers, CMA; 12/03/2016 4:30 PM) Anastrozole (1MG  Tablet, Oral) Active. BuPROPion HCl ER (XL) (300MG  Tablet ER 24HR, Oral) Active. Furosemide (40MG  Tablet, Oral) Active. Klor-Con M20 [facility] Tablet ER, Oral) Active. Meloxicam (15MG  Tablet, Oral) Active. Metoprolol Tartrate (25MG  Tablet, Oral) Active. Omeprazole (20MG  Capsule DR, Oral) Active. Sertraline HCl (100MG  Tablet, Oral) Active. Baclofen (20MG  Tablet, Oral) Active. Medications Reconciled  Social History Illene Regulus, CMA; 12/03/2016 4:21 PM) Alcohol use  Occasional alcohol use. Caffeine use  Carbonated beverages, Coffee. No drug use  Tobacco use  Former smoker.  Family History Illene Regulus, [place]; 12/03/2016 4:21 PM) Alcohol Abuse  Father. Cerebrovascular Accident  Father. Depression  Mother. Heart disease in female family member before age 83  Hypertension  Father. Thyroid problems  Daughter.  Other Problems Illene Regulus, CMA; 12/03/2016 4:21 PM) Anxiety Disorder   Breast Cancer  Cholelithiasis  Gastroesophageal Reflux Disease  Hypercholesterolemia  Sleep Apnea     Review of Systems (Alisha Spillers CMA; 12/03/2016 4:21 PM) General Present- Night Sweats. Not Present- Appetite Loss, Chills, Fatigue, Fever, Weight Gain and Weight Loss. Skin Not Present- Change in Wart/Mole, Dryness, Hives, Jaundice, New Lesions, Non-Healing Wounds, Rash and Ulcer. HEENT Present- Seasonal Allergies and Wears glasses/contact lenses. Not Present- Earache, Hearing Loss, Hoarseness, Nose Bleed, Oral Ulcers, Ringing in the Ears, Sinus Pain, Sore Throat, Visual Disturbances and Yellow Eyes. Respiratory Present- Snoring. Not Present- Bloody sputum, Chronic Cough, Difficulty Breathing and Wheezing. Breast Not Present- Breast Mass, Breast Pain, Nipple Discharge and Skin Changes. Cardiovascular Present- Shortness of Breath. Not Present- Chest Pain, Difficulty Breathing Lying Down, Leg Cramps, Palpitations, Rapid Heart Rate and Swelling of Extremities. Gastrointestinal Not Present- Abdominal Pain, Bloating, Bloody Stool, Change in Bowel Habits, Chronic diarrhea, Constipation, Difficulty Swallowing, Excessive gas, Gets full quickly at meals, Hemorrhoids, Indigestion, Nausea, Rectal Pain and Vomiting. Female Genitourinary Present- Urine Leakage. Not Present- Blood in Urine, Change in Urinary Stream, Frequency, Impotence, Nocturia, Painful Urination and Urgency. Musculoskeletal Not Present- Back Pain, Joint Pain, Joint Stiffness, Muscle Pain, Muscle Weakness and Swelling of Extremities. Neurological Not Present- Decreased Memory, Fainting, Headaches, Numbness, Seizures, Tingling, Tremor, Trouble walking and Weakness. Psychiatric Present- Anxiety. Not Present- Bipolar, Change in Sleep Pattern, Depression, Fearful and Frequent crying. Endocrine Present- Hot flashes. Not Present- Cold Intolerance, Excessive Hunger, Hair Changes, Heat Intolerance and New Diabetes. Hematology Not Present-  Blood Thinners, Easy Bruising, Excessive bleeding, Gland problems, HIV and Persistent Infections.  Vitals (Alisha Spillers CMA; 12/03/2016 4:28 PM) 12/03/2016 4:27 PM Weight: 259 lb Height: 64in Body Surface Area: 2.18 m Body Mass Index: 44.46 kg/m  Pulse: 72 (Regular)  BP: 122/82 (  Sitting, Left Arm, Standard)       Physical Exam Rolm Bookbinder MD; 12/08/2016 8:55 PM) General Mental Status-Alert. Orientation-Oriented X3.  Head and Neck Thyroid -Note: no thyromegaly.   Eye Sclera/Conjunctiva - Bilateral-No scleral icterus.  Chest and Lung Exam Chest and lung exam reveals -quiet, even and easy respiratory effort with no use of accessory muscles and on auscultation, normal breath sounds, no adventitious sounds and normal vocal resonance.  Cardiovascular Cardiovascular examination reveals -normal heart sounds, regular rate and rhythm with no murmurs.  Abdomen Note: mild ruq pain to deep palpation, no murphys sign soft nd bs present   Lymphatic Head & Neck  General Head & Neck Lymphatics: Bilateral - Description - Normal.    Assessment & Plan Rolm Bookbinder MD; 12/08/2016 8:56 PM) GALLSTONES (K80.20) Story: laparoscopic cholecystectomy discussed symptoms do appear related to gallstones. discussed indication for surgery to prevent further symptoms as well as other complications related to gb disease. discussed surgery with patient as well as recovery. risks include but not limited to bleeding, infection open procedure, cbd injury, cystic duct leak etc. discussed possible postop loose stools that would likely resolve. we will proceed as soon as possible

## 2016-12-31 NOTE — Discharge Instructions (Signed)
[facility] LAPAROSCOPIC SURGERY: POST OP INSTRUCTIONS  Always review your discharge instruction sheet given to you by the facility where your surgery was performed. IF YOU HAVE DISABILITY OR FAMILY LEAVE FORMS, YOU MUST BRING THEM TO THE OFFICE FOR PROCESSING.   DO NOT GIVE THEM TO YOUR DOCTOR.  1. A prescription for pain medication may be given to you upon discharge.  Take your pain medication as prescribed, if needed.  If narcotic pain medicine is not needed, then you may take acetaminophen (Tylenol), naprosyn (Alleve), or ibuprofen (Advil) as needed. 2. Take your usually prescribed medications unless otherwise directed. 3. If you need a refill on your pain medication, please contact your pharmacy.  They will contact our office to request authorization. Prescriptions will not be filled after 5pm or on week-ends. 4. You should follow a light diet the first few days after arrival home, such as soup and crackers, etc.  Be sure to include lots of fluids daily. 5. Most patients will experience some swelling and bruising in the area of the incisions.  Ice packs will help.  Swelling and bruising can take several days to resolve.  6. It is common to experience some constipation if taking pain medication after surgery.  Increasing fluid intake and taking a stool softener (such as Colace) will usually help or prevent this problem from occurring.  A mild laxative (Milk of Magnesia or Miralax) should be taken according to package instructions if there are no bowel movements after 48 hours. 7. Unless discharge instructions indicate otherwise, you may remove your bandages 48 hours after surgery, and you may shower at that time.  You may have steri-strips (small skin tapes) in place directly over the incision.  These strips should be left on the skin for 7-10 days.  If your surgeon used skin glue on the incision, you may shower in 24 hours.  The glue will flake  off over the next 2-3 weeks.  Any sutures or staples will be removed at the office during your follow-up visit. 8. ACTIVITIES:  You may resume regular (light) daily activities beginning the next day--such as daily self-care, walking, climbing stairs--gradually increasing activities as tolerated.  You may have sexual intercourse when it is comfortable.  Refrain from any heavy lifting or straining until approved by your doctor. a. You may drive when you are no longer taking prescription pain medication, you can comfortably wear a seatbelt, and you can safely maneuver your car and apply brakes. b. RETURN TO WORK:  __________________________________________________________ 9. You should see your doctor in the office for a follow-up appointment approximately 2-3 weeks after your surgery.  Make sure that you call for this appointment within a day or two after you arrive home to insure a convenient appointment time. 10. OTHER INSTRUCTIONS: __________________________________________________________________________________________________________________________ __________________________________________________________________________________________________________________________ WHEN TO CALL YOUR DOCTOR: 1. Fever over 101.0 2. Inability to urinate 3. Continued bleeding from incision. 4. Increased pain, redness, or drainage from the incision. 5. Increasing abdominal pain  The clinic staff is available to answer your questions during regular business hours.  Please don't hesitate to call and ask to speak to one of the nurses for clinical concerns.  If you have a medical emergency, go to the nearest emergency room or call 911.  A surgeon from [facility] is always on call at the hospital. [address], [address], [place], [place]  [address] ? [address] [address], , [place]   [address] [phone] ? [phone] ? [phone] Web site: [email or web address]  

## 2017-01-01 ENCOUNTER — Encounter (HOSPITAL_COMMUNITY): Payer: Self-pay | Admitting: General Surgery

## 2017-01-02 NOTE — Anesthesia Postprocedure Evaluation (Signed)
Anesthesia Post Note  Patient: Stephanie Jarvis  Procedure(s) Performed: Procedure(s) (LRB): LAPAROSCOPIC CHOLECYSTECTOMY WITH POSSIBLE INTRAOPERATIVE CHOLANGIOGRAM (N/A)     Patient location during evaluation: PACU Anesthesia Type: General Level of consciousness: awake, awake and alert and oriented Pain management: pain level controlled Vital Signs Assessment: post-procedure vital signs reviewed and stable Respiratory status: spontaneous breathing, nonlabored ventilation and respiratory function stable Cardiovascular status: blood pressure returned to baseline Anesthetic complications: no    Last Vitals:  Vitals:   12/31/16 1210 12/31/16 1225  BP: (!) 148/78   Pulse: 74 87  Resp: 13 17  Temp:  36.5 C    Last Pain:  Vitals:   12/31/16 1320  TempSrc:   PainSc: 4                  Marynell Bies COKER

## 2017-01-15 DIAGNOSIS — F339 Major depressive disorder, recurrent, unspecified: Secondary | ICD-10-CM | POA: Diagnosis not present

## 2017-01-15 DIAGNOSIS — E669 Obesity, unspecified: Secondary | ICD-10-CM | POA: Diagnosis not present

## 2017-01-15 DIAGNOSIS — Z1389 Encounter for screening for other disorder: Secondary | ICD-10-CM | POA: Diagnosis not present

## 2017-01-15 DIAGNOSIS — J4520 Mild intermittent asthma, uncomplicated: Secondary | ICD-10-CM | POA: Diagnosis not present

## 2017-01-15 DIAGNOSIS — E7800 Pure hypercholesterolemia, unspecified: Secondary | ICD-10-CM | POA: Diagnosis not present

## 2017-01-15 DIAGNOSIS — D0591 Unspecified type of carcinoma in situ of right breast: Secondary | ICD-10-CM | POA: Diagnosis not present

## 2017-01-15 DIAGNOSIS — Z23 Encounter for immunization: Secondary | ICD-10-CM | POA: Diagnosis not present

## 2017-01-15 DIAGNOSIS — Z0001 Encounter for general adult medical examination with abnormal findings: Secondary | ICD-10-CM | POA: Diagnosis not present

## 2017-02-20 DIAGNOSIS — G5602 Carpal tunnel syndrome, left upper limb: Secondary | ICD-10-CM | POA: Diagnosis not present

## 2017-02-20 DIAGNOSIS — J4520 Mild intermittent asthma, uncomplicated: Secondary | ICD-10-CM | POA: Diagnosis not present

## 2017-03-05 ENCOUNTER — Ambulatory Visit: Payer: Medicare Other | Admitting: Cardiology

## 2017-03-12 ENCOUNTER — Encounter (HOSPITAL_COMMUNITY): Payer: Medicare Other | Attending: Oncology | Admitting: Oncology

## 2017-03-12 ENCOUNTER — Encounter (HOSPITAL_COMMUNITY): Payer: Self-pay | Admitting: Oncology

## 2017-03-12 VITALS — BP 116/85 | HR 77 | Temp 97.9°F | Resp 20 | Wt 256.7 lb

## 2017-03-12 DIAGNOSIS — N951 Menopausal and female climacteric states: Secondary | ICD-10-CM | POA: Diagnosis not present

## 2017-03-12 DIAGNOSIS — Z79811 Long term (current) use of aromatase inhibitors: Secondary | ICD-10-CM | POA: Diagnosis not present

## 2017-03-12 DIAGNOSIS — C50912 Malignant neoplasm of unspecified site of left female breast: Secondary | ICD-10-CM | POA: Diagnosis not present

## 2017-03-12 DIAGNOSIS — Z170 Estrogen receptor positive status [ER+]: Secondary | ICD-10-CM | POA: Diagnosis not present

## 2017-03-12 DIAGNOSIS — Z853 Personal history of malignant neoplasm of breast: Secondary | ICD-10-CM | POA: Diagnosis not present

## 2017-03-12 DIAGNOSIS — C50111 Malignant neoplasm of central portion of right female breast: Secondary | ICD-10-CM

## 2017-03-12 MED ORDER — ANASTROZOLE 1 MG PO TABS: 1.0000 mg | ORAL_TABLET | Freq: Every day | ORAL | 1 refills | Status: DC

## 2017-03-12 NOTE — Progress Notes (Signed)
[name], MD [address] [place] [address]   DIAGNOSIS: Stage I (Ta N0M0) ER/PR positive, HER-2/neu unknown, (insufficiency tumor for Her-2 status) Ki-67 of 4%, status post left simple mastectomy, no evidence of disease. Intermediate Grade DCIS  Remote history of stage I (T1c N0 M0) invasive lobular carcinoma of the left breast, status post lumpectomy, sentinel node biopsy, Oncotype DX score 35 with ER PR positive, HER-2/neu negative, treated with lumpectomy, 4 cycles of Taxotere/Cytoxan ending on 02/12/2007, radiotherapy to the left breast with anastrozole 1 mg daily from 01/2007 until 01/2012. (Treated in [place])  DEXA bone density 03/09/2014; normal bone density  CURRENT THERAPY: Arimidex 1 mg daily  INTERVAL HISTORY: Stephanie Jarvis 66 y.o. female returns for follow-up of a stage I ER positive, PR positive, HER-2 unknown breast cancer of the left breast. She was originally diagnosed with breast cancer in the left breast in 2008. She was treated with surgery, chemotherapy and radiation at that time. She states she took Arimidex for approximately 5 years.   She was then diagnosed with a second breast cancer in the left breast in 2015. She has undergone a left mastectomy. She was started back on Arimidex.  Patient presents today for follow-up.  She had a right diagnostic mammogram on5/9/18 which was negative. Since her last visit, she was experiencing symptomatic cholelithiasis and underwent a lap cholecystectomy on 12/31/16 by Dr. Donne Hazel. Surgical path was negative for malignancy. Since her surgery she has occasional diarrhea after she eats. She continues to take arimidex daily without any side effects other than hot flashes. She denies palpated any new masses on her right breast or left chest wall.  MEDICAL HISTORY: Past Medical History:  Diagnosis Date  . Ankle pain   . Anxiety   . Arthritis   . Breast cancer ([place]) 2008   ILC of Left breast; ER+  . Breast  cancer ([place]) 2015   IDC+DCIS of Left breast; ER/PR+, Her2-, Ki67 = 4%  . Complication of anesthesia    Aspirates when having per patient- "everytime per her daughter"  DId not the last time, per patient  . Dyspnea    with exertion  . Dysrhythmia 2011   Atrial Fibrillation-PAF--not present since Metoprolol  . Family history of anesthesia complication   . GERD (gastroesophageal reflux disease)   . H/O hiatal hernia 2003  . Hepatitis    Hepatitis A when 66 years old  . High cholesterol   . History of blood transfusion    during chemo  . Irregular heartbeat   . Knee pain   . Pneumonia    12/23/16- many years ago  . Sciatica   . Snoring   . Vertigo     has OA (osteoarthritis) of knee; Pain in joint, lower leg; Stiffness of joint, lower leg; Difficulty in walking(719.7); Swelling of joint of left knee; PTTD (posterior tibial tendon dysfunction); Metatarsal fracture; and Breast cancer ([place]) on her problem list.     is allergic to no known allergies.  Ms. Kagan had no medications administered during this visit.  SURGICAL HISTORY: Past Surgical History:  Procedure Laterality Date  . BREAST LUMPECTOMY Left 1982,2008   x 2   . CHOLECYSTECTOMY N/A 12/31/2016   Procedure: LAPAROSCOPIC CHOLECYSTECTOMY WITH POSSIBLE INTRAOPERATIVE CHOLANGIOGRAM;  Surgeon: Rolm Bookbinder, MD;  Location: [facility];  Service: General;  Laterality: N/A;  . COLONOSCOPY    . HIATAL HERNIA REPAIR  2003  . lymph node removal  2008  .  MASTECTOMY MODIFIED RADICAL Left 01/26/2014   Procedure: LEFT MODIFIED RADICAL MASTECTOMY;  Surgeon: Jamesetta So, MD;  Location: [facility];  Service: General;  Laterality: Left;  . PORT-A-CATH REMOVAL  2009 ish  . PORTA CATH INSERTION  [id number]  . TOTAL KNEE ARTHROPLASTY Right 2006  . TOTAL KNEE ARTHROPLASTY  08/12/2011   Procedure: TOTAL KNEE ARTHROPLASTY;  Surgeon: Arther Abbott, MD;  Location: [facility];  Service: Orthopedics;  Laterality: Left;  . TUBAL LIGATION  1980     SOCIAL HISTORY: Social History   Social History  . Marital status: Widowed    Spouse name: N/A  . Number of children: 2  . Years of education: 12   Occupational History  . retired    Social History Main Topics  . Smoking status: Former Smoker    Packs/day: 3.00    Years: 25.00    Types: Cigarettes    Quit date: 06/30/1998  . Smokeless tobacco: Never Used  . Alcohol use 0.0 oz/week     Comment: 2 glasses wine per month  . Drug use: No  . Sexual activity: Yes    Birth control/ protection: Surgical   Other Topics Concern  . Not on file   Social History Narrative   Drinks 2-3 cups of coffee a day     FAMILY HISTORY: Family History  Problem Relation Age of Onset  . Stroke Father   . Heart disease Father   . Skin cancer Sister        dx. 78-58; unknown type  . Throat cancer Maternal Uncle 75       smoker; voicebox removed  . Alzheimer's disease Paternal Grandmother   . Heart attack Paternal Grandfather   . Alzheimer's disease Maternal Aunt   . Heart disease Maternal Uncle   . Lung cancer Cousin        smoker  . Lung cancer Paternal Aunt        dx. mid-70s; worked at [employer]  . Heart attack Daughter        caused by thyroid issues  . Heart disease Unknown   . Arthritis Unknown   . Lung disease Unknown   . Cancer Unknown   . Kidney disease Unknown     Review of Systems  Constitutional:       Hot flashes No loss of appetite  HENT: Negative.   Eyes: Negative.   Respiratory: Negative for shortness of breath.   Cardiovascular: Negative.  Negative for chest pain.  Gastrointestinal: Positive for diarrhea.  Genitourinary: Negative.   Musculoskeletal: Negative for joint pain.  Skin: Negative.   Neurological: Negative.   Endo/Heme/Allergies: Negative.   Psychiatric/Behavioral: Negative.   All other systems reviewed and are negative. 14 point review of systems was performed and is negative except as detailed under history of present illness and  above  PHYSICAL EXAMINATION ECOG PERFORMANCE STATUS: 0 - Asymptomatic Vital signs blood pressure 116/85 pulse 77 respiratory 20 temp 97.9 O2 sat 97%.  Physical Exam  Constitutional: She is oriented to person, place, and time and well-developed, well-nourished, and in no distress. No distress.  HENT:  Head: Normocephalic and atraumatic.  Mouth/Throat: No oropharyngeal exudate.  Eyes: Pupils are equal, round, and reactive to light. Conjunctivae and EOM are normal. No scleral icterus.  Neck: Normal range of motion. Neck supple. No JVD present.  Cardiovascular: Normal rate, regular rhythm and normal heart sounds.  Exam reveals no gallop and no friction rub.   No murmur heard. Pulmonary/Chest: Effort normal  and breath sounds normal. No respiratory distress. She has no wheezes. She has no rales.    Abdominal: Soft. Bowel sounds are normal. She exhibits no distension. There is no tenderness. There is no guarding.  Musculoskeletal: Normal range of motion. She exhibits no edema or tenderness.  Lymphadenopathy:    She has no cervical adenopathy.    She has no axillary adenopathy.  Neurological: She is alert and oriented to person, place, and time. No cranial nerve deficit. Gait normal.  Skin: Skin is warm and dry. No rash noted. No erythema. No pallor.  Psychiatric: Affect and judgment normal.  Nursing note and vitals reviewed.  LABORATORY DATA: I have reviewed the data as listed  CBC    Component Value Date/Time   WBC 6.7 12/24/2016 0955   RBC 4.14 12/24/2016 0955   HGB 12.8 12/24/2016 0955   HCT 38.3 12/24/2016 0955   PLT 236 12/24/2016 0955   MCV 92.5 12/24/2016 0955   MCH 30.9 12/24/2016 0955   MCHC 33.4 12/24/2016 0955   RDW 13.5 12/24/2016 0955   LYMPHSABS 2.5 09/02/2016 1410   MONOABS 0.7 09/02/2016 1410   EOSABS 0.2 09/02/2016 1410   BASOSABS 0.1 09/02/2016 1410   CMP     Component Value Date/Time   NA 139 12/24/2016 0955   K 4.3 12/24/2016 0955   CL 104 12/24/2016  0955   CO2 27 12/24/2016 0955   GLUCOSE 114 (H) 12/24/2016 0955   BUN 14 12/24/2016 0955   CREATININE 0.89 12/24/2016 0955   CALCIUM 9.5 12/24/2016 0955   PROT 7.4 09/02/2016 1410   ALBUMIN 4.0 09/02/2016 1410   AST 29 09/02/2016 1410   ALT 29 09/02/2016 1410   ALKPHOS 69 09/02/2016 1410   BILITOT 0.5 09/02/2016 1410   GFRNONAA >60 12/24/2016 0955   GFRAA >60 12/24/2016 0955   RADIOLOGY: I have reviewed the images below and agree with the reported results  DEXA Scan 03/13/2016 DENSITOMETRY RESULTS: Site          Region     Measured Date Measured Age WHO Classification Young Adult T-score BMD         %Change vs. Previous Significant Change (*)  DualFemur Neck Right 1/13/2016 66.9 Normal -0.1 1.023 g/cm2 -5.4% Yes DualFemur Neck Right 1/09/2014 64.9 Normal 0.3 1.081 g/cm2 - -  Right Forearm Radius 33% 03/13/2016 66.9 Normal 0.1 0.718 g/cm2 -0.8% - Right Forearm Radius 33% 03/09/2014 64.9 Normal 0.1 0.724 g/cm2 - - ASSESSMENT: BMD as determined from Femur Neck Right is 1.023 g/cm2 with a T-Score of -0.1. This patient is considered normal according to [facility]) criteria. Compared with the prior study on 03/09/14, the BMD of the lt. femoral neck shows a statistically significant decrease. (Lumbar spine was not utilized due to advanced degenerative changes.)  ASSESSMENT and THERAPY PLAN:   Stage I ER+, PR+ , Her-2 neu L breast cancer History of lobular carcinoma of the L breast in 2008, ER+ NORMAL DEXA 03/2016  Continue calcium, vitamin D, and Arimidex. Refilled Arimidex.   Repeat R screening mammogram in May 2019.  Repeat bone density in September 2019, last bone density in 03/2016 was normal.  Rx for new mastectomy bras with prosthesis given to patient.  She will return for follow up in 6 months with CBC, CMP.   All questions were answered. The patient knows to call the clinic with any problems, questions or concerns. We can certainly see  the patient much sooner if necessary.   This note  was electronically signed.  Twana First, MD

## 2017-03-12 NOTE — Patient Instructions (Signed)
[facility] at [facility] Discharge Instructions  RECOMMENDATIONS MADE BY THE CONSULTANT AND ANY TEST RESULTS WILL BE SENT TO YOUR REFERRING PHYSICIAN.  You were seen today by Dr. Louise Zhou Follow up in 6 months with lab work   Thank you for choosing [facility] at [facility] to provide your oncology and hematology care.  To afford each patient quality time with our provider, please arrive at least 15 minutes before your scheduled appointment time.    If you have a lab appointment with the Cancer Center please come in thru the  Main Entrance and check in at the main information desk  You need to re-schedule your appointment should you arrive 10 or more minutes late.  We strive to give you quality time with our providers, and arriving late affects you and other patients whose appointments are after yours.  Also, if you no show three or more times for appointments you may be dismissed from the clinic at the providers discretion.     Again, thank you for choosing [facility].  Our hope is that these requests will decrease the amount of time that you wait before being seen by our physicians.       _____________________________________________________________  Should you have questions after your visit to [facility], please contact our office at [phone] between the hours of 8:30 a.m. and 4:30 p.m.  Voicemails left after 4:30 p.m. will not be returned until the following business day.  For prescription refill requests, have your pharmacy contact our office.       Resources For Cancer Patients and their Caregivers ? American Cancer Society: Can assist with transportation, wigs, general needs, runs Look Good Feel Better.        1-888-227-6333 ? Cancer Care: Provides financial assistance, online support groups, medication/co-pay assistance.  1-800-813-HOPE (4673) ? [facility] Assists  [place] cancer patients and their families through emotional , educational and financial support.  336-427-4357 ? [place] DSS Where to apply for food stamps, Medicaid and utility assistance. 336-342-1394 ? [facility]: Transportation to medical appointments. 336-347-2287 ? Social Security Administration: May apply for disability if have a Stage IV cancer. 336-342-7796 1-800-772-1213 ? [place] Aging, Disability and Transit Services: Assists with nutrition, care and transit needs. 336-349-2343  Cancer Center Support Programs: @10RELATIVEDAYS@ > Cancer Support Group  2nd Tuesday of the month 1pm-2pm, Journey Room  > Creative Journey  3rd Tuesday of the month 1130am-1pm, Journey Room  > Look Good Feel Better  1st Wednesday of the month 10am-12 noon, Journey Room (Call American Cancer Society to register 1-800-395-5775)    

## 2017-03-17 ENCOUNTER — Ambulatory Visit (INDEPENDENT_AMBULATORY_CARE_PROVIDER_SITE_OTHER): Payer: Medicare Other

## 2017-03-17 ENCOUNTER — Encounter: Payer: Self-pay | Admitting: Orthopedic Surgery

## 2017-03-17 ENCOUNTER — Ambulatory Visit (INDEPENDENT_AMBULATORY_CARE_PROVIDER_SITE_OTHER): Payer: Medicare Other | Admitting: Orthopedic Surgery

## 2017-03-17 VITALS — BP 138/78 | HR 91 | Ht 66.0 in | Wt 257.0 lb

## 2017-03-17 DIAGNOSIS — M25532 Pain in left wrist: Secondary | ICD-10-CM

## 2017-03-17 DIAGNOSIS — M6530 Trigger finger, unspecified finger: Secondary | ICD-10-CM

## 2017-03-17 DIAGNOSIS — M654 Radial styloid tenosynovitis [de Quervain]: Secondary | ICD-10-CM

## 2017-03-17 NOTE — Patient Instructions (Addendum)
Trigger Finger Trigger finger (stenosing tenosynovitis) is a condition that causes a finger to get stuck in a bent position. Each finger has a tough, cord-like tissue that connects muscle to bone (tendon), and each tendon is surrounded by a tunnel of tissue (tendon sheath). To move your finger, your tendon needs to slide freely through the sheath. Trigger finger happens when the tendon or the sheath thickens, making it difficult to move your finger. Trigger finger can affect any finger or a thumb. It may affect more than one finger. Mild cases may clear up with rest and medicine. Severe cases require more treatment. What are the causes? Trigger finger is caused by a thickened finger tendon or tendon sheath. The cause of this thickening is not known. What increases the risk? The following factors may make you more likely to develop this condition:  Doing activities that require a strong grip.  Having rheumatoid arthritis, gout, or diabetes.  Being 21-75 years old.  Being a woman.  What are the signs or symptoms? Symptoms of this condition include:  Pain when bending or straightening your finger.  Tenderness or swelling where your finger attaches to the palm of your hand.  A lump in the palm of your hand or on the inside of your finger.  Hearing a popping sound when you try to straighten your finger.  Feeling a popping, catching, or locking sensation when you try to straighten your finger.  Being unable to straighten your finger.  How is this diagnosed? This condition is diagnosed based on your symptoms and a physical exam. How is this treated? This condition may be treated by:  Resting your finger and avoiding activities that make symptoms worse.  Wearing a finger splint to keep your finger in a slightly bent position.  Taking NSAIDs to relieve pain and swelling.  Injecting medicine (steroids) into the tendon sheath to reduce swelling and irritation. Injections may need to be  repeated.  Having surgery to open the tendon sheath. This may be done if other treatments do not work and you cannot straighten your finger. You may need physical therapy after surgery.  Follow these instructions at home:  Use moist heat to help reduce pain and swelling as told by your health care provider.  Rest your finger and avoid activities that make pain worse. Return to normal activities as told by your health care provider.  If you have a splint, wear it as told by your health care provider.  Take over-the-counter and prescription medicines only as told by your health care provider.  Keep all follow-up visits as told by your health care provider. This is important. Contact a health care provider if:  Your symptoms are not improving with home care. Summary  Trigger finger (stenosing tenosynovitis) causes your finger to get stuck in a bent position, and it can make it difficult and painful to straighten your finger.  This condition develops when a finger tendon or tendon sheath thickens.  Treatment starts with resting, wearing a splint, and taking NSAIDs.  In severe cases, surgery to open the tendon sheath may be needed. This information is not intended to replace advice given to you by your health care provider. Make sure you discuss any questions you have with your health care provider. Document Released: 04/06/2004 Document Revised: 05/28/2016 Document Reviewed: 05/28/2016 Elsevier Interactive Patient Education  2017 Elsevier Inc.   Wear splint x 6 weeks   Ice the wrist 4 x a day for 20 min   Call  the office IF you are not better after 6 weeks

## 2017-03-17 NOTE — Progress Notes (Signed)
New patient  Last seen June 2015  Chief complaint pain left wrist. The patient has had left wrist pain now for over a month it is over the first extensor compartment and she has triggering phenomenon left long finger. She has dull aching moderately severe pain which is constant worse with motions that cause ulnar deviation and the clicking and popping is starting to bother her. She denies any trauma.  Review of Systems  Respiratory: Positive for shortness of breath.   Cardiovascular: Negative for chest pain.  Neurological: Negative for tingling and sensory change.   Past Medical History:  Diagnosis Date  . Ankle pain   . Anxiety   . Arthritis   . Breast cancer ([place]) 2008   ILC of Left breast; ER+  . Breast cancer ([facility]) 2015   IDC+DCIS of Left breast; ER/PR+, Her2-, Ki67 = 4%  . Complication of anesthesia    Aspirates when having per patient- "everytime per her daughter"  DId not the last time, per patient  . Dyspnea    with exertion  . Dysrhythmia 2011   Atrial Fibrillation-PAF--not present since Metoprolol  . Family history of anesthesia complication   . GERD (gastroesophageal reflux disease)   . H/O hiatal hernia 2003  . Hepatitis    Hepatitis A when 66 years old  . High cholesterol   . History of blood transfusion    during chemo  . Irregular heartbeat   . Knee pain   . Pneumonia    12/23/16- many years ago  . Sciatica   . Snoring   . Vertigo    Past Surgical History:  Procedure Laterality Date  . BREAST LUMPECTOMY Left 1982,2008   x 2   . CHOLECYSTECTOMY N/A 12/31/2016   Procedure: LAPAROSCOPIC CHOLECYSTECTOMY WITH POSSIBLE INTRAOPERATIVE CHOLANGIOGRAM;  Surgeon: Rolm Bookbinder, MD;  Location: [facility];  Service: General;  Laterality: N/A;  . COLONOSCOPY    . HIATAL HERNIA REPAIR  2003  . lymph node removal  2008  . MASTECTOMY MODIFIED RADICAL Left 01/26/2014   Procedure: LEFT MODIFIED RADICAL MASTECTOMY;  Surgeon: Jamesetta So, MD;  Location: [facility];  Service:  General;  Laterality: Left;  . PORT-A-CATH REMOVAL  2009 ish  . PORTA CATH INSERTION  [id number]  . TOTAL KNEE ARTHROPLASTY Right 2006  . TOTAL KNEE ARTHROPLASTY  08/12/2011   Procedure: TOTAL KNEE ARTHROPLASTY;  Surgeon: Arther Abbott, MD;  Location: [facility];  Service: Orthopedics;  Laterality: Left;  . TUBAL LIGATION  1980    BP 138/78   Pulse 91   Ht 5' 6"  (1.676 m)   Wt 257 lb (116.6 kg)   BMI 41.48 kg/m  This is a well-developed well-nourished patient grooming and hygiene normal she is oriented 3 mood and affect pleasant and flat respectively  Has normal gait and station  Left wrist and hand shows some swelling. She has tenderness over the A1 pulley of the left long finger with tenderness over the first extensor compartment of the left wrist. Range of motion of the wrist is normal she has decreased flexion extension of the interphalangeal joints of her left hand. 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  . Left wrist pain   . De Quervain's tenosynovitis, left   . Acquired trigger finger Yes    I injected her left trigger finger and I placed in a left wrist splint. She should continue ice and she is artery on meloxicam which she should continue for pain  Follow-up in 6 weeks if she has not improved  Trigger finger injection  Diagnosis  tenosynovitis left long finger Procedure injection A1 pulley Medications lidocaine 1% 1 mL and Depo-Medrol 40 mg 1 mL Skin prep alcohol and ethyl chloride Verbal consent was obtained Timeout confirmed the injection site  After cleaning the skin with alcohol and  anesthetizing the skin with ethyl chloride the A1 pulley was palpated and the injection was performed without complication

## 2017-04-14 ENCOUNTER — Encounter (HOSPITAL_COMMUNITY): Payer: Self-pay | Admitting: *Deleted

## 2017-04-18 DIAGNOSIS — Z23 Encounter for immunization: Secondary | ICD-10-CM | POA: Diagnosis not present

## 2017-04-18 DIAGNOSIS — F339 Major depressive disorder, recurrent, unspecified: Secondary | ICD-10-CM | POA: Diagnosis not present

## 2017-04-18 DIAGNOSIS — J4520 Mild intermittent asthma, uncomplicated: Secondary | ICD-10-CM | POA: Diagnosis not present

## 2017-04-18 DIAGNOSIS — D0591 Unspecified type of carcinoma in situ of right breast: Secondary | ICD-10-CM | POA: Diagnosis not present

## 2017-07-14 DIAGNOSIS — K219 Gastro-esophageal reflux disease without esophagitis: Secondary | ICD-10-CM | POA: Diagnosis not present

## 2017-07-14 DIAGNOSIS — F339 Major depressive disorder, recurrent, unspecified: Secondary | ICD-10-CM | POA: Diagnosis not present

## 2017-07-14 DIAGNOSIS — E785 Hyperlipidemia, unspecified: Secondary | ICD-10-CM | POA: Diagnosis not present

## 2017-08-11 DIAGNOSIS — K219 Gastro-esophageal reflux disease without esophagitis: Secondary | ICD-10-CM | POA: Diagnosis not present

## 2017-08-11 DIAGNOSIS — J45901 Unspecified asthma with (acute) exacerbation: Secondary | ICD-10-CM | POA: Diagnosis not present

## 2017-08-13 ENCOUNTER — Ambulatory Visit (HOSPITAL_COMMUNITY)
Admission: RE | Admit: 2017-08-13 | Discharge: 2017-08-13 | Disposition: A | Discharge status: Home / Self Care | Payer: Medicare Other | Source: Ambulatory Visit | Attending: Internal Medicine | Admitting: Internal Medicine

## 2017-08-13 ENCOUNTER — Other Ambulatory Visit (HOSPITAL_COMMUNITY): Payer: Self-pay | Admitting: Internal Medicine

## 2017-08-13 DIAGNOSIS — R059 Cough, unspecified: Secondary | ICD-10-CM

## 2017-08-13 DIAGNOSIS — I517 Cardiomegaly: Secondary | ICD-10-CM | POA: Diagnosis not present

## 2017-08-13 DIAGNOSIS — R0989 Other specified symptoms and signs involving the circulatory and respiratory systems: Secondary | ICD-10-CM | POA: Insufficient documentation

## 2017-08-13 DIAGNOSIS — R05 Cough: Secondary | ICD-10-CM | POA: Diagnosis not present

## 2017-08-15 DIAGNOSIS — R0602 Shortness of breath: Secondary | ICD-10-CM | POA: Diagnosis not present

## 2017-08-15 DIAGNOSIS — J45901 Unspecified asthma with (acute) exacerbation: Secondary | ICD-10-CM | POA: Diagnosis not present

## 2017-08-18 ENCOUNTER — Other Ambulatory Visit: Payer: Self-pay

## 2017-08-18 ENCOUNTER — Other Ambulatory Visit (HOSPITAL_COMMUNITY): Payer: Self-pay | Admitting: Internal Medicine

## 2017-08-18 ENCOUNTER — Ambulatory Visit (HOSPITAL_COMMUNITY)
Admission: RE | Admit: 2017-08-18 | Discharge: 2017-08-18 | Disposition: A | Discharge status: Home / Self Care | Payer: Medicare Other | Source: Ambulatory Visit | Attending: Internal Medicine | Admitting: Internal Medicine

## 2017-08-18 ENCOUNTER — Encounter (HOSPITAL_COMMUNITY): Payer: Self-pay

## 2017-08-18 DIAGNOSIS — Z853 Personal history of malignant neoplasm of breast: Secondary | ICD-10-CM | POA: Diagnosis not present

## 2017-08-18 DIAGNOSIS — I313 Pericardial effusion (noninflammatory): Secondary | ICD-10-CM | POA: Insufficient documentation

## 2017-08-18 DIAGNOSIS — R0602 Shortness of breath: Secondary | ICD-10-CM

## 2017-08-18 LAB — ECHOCARDIOGRAM COMPLETE
CHL CUP MV DEC (S): 232
E decel time: 232 msec
EERAT: 12.92
FS: 26 % — AB (ref 28–44)
IV/PV OW: 1.09
LA diam end sys: 42 mm
LA diam index: 1.76 cm/m2
LA vol A4C: 90.5 ml
LASIZE: 42 mm
LAVOL: 88.7 mL
LAVOLIN: 37.1 mL/m2
LV TDI E'LATERAL: 6.53
LVEEAVG: 12.92
LVEEMED: 12.92
LVELAT: 6.53 cm/s
LVOT area: 3.14 cm2
LVOTD: 20 mm
Lateral S' vel: 14.9 cm/s
MV Peak grad: 3 mmHg
MV pk A vel: 77.6 m/s
MVPKEVEL: 84.4 m/s
PW: 12.9 mm — AB (ref 0.6–1.1)
TAPSE: 22.3 mm
TDI e' medial: 7.29

## 2017-08-25 ENCOUNTER — Other Ambulatory Visit: Payer: Self-pay

## 2017-08-25 ENCOUNTER — Emergency Department (HOSPITAL_COMMUNITY): Payer: Medicare Other

## 2017-08-25 ENCOUNTER — Emergency Department (HOSPITAL_COMMUNITY)
Admission: EM | Admit: 2017-08-25 | Discharge: 2017-08-25 | Disposition: A | Discharge status: Home / Self Care | Payer: Medicare Other | Attending: Emergency Medicine | Admitting: Emergency Medicine

## 2017-08-25 ENCOUNTER — Encounter (HOSPITAL_COMMUNITY): Payer: Self-pay | Admitting: Emergency Medicine

## 2017-08-25 DIAGNOSIS — F419 Anxiety disorder, unspecified: Secondary | ICD-10-CM | POA: Insufficient documentation

## 2017-08-25 DIAGNOSIS — Z9049 Acquired absence of other specified parts of digestive tract: Secondary | ICD-10-CM | POA: Diagnosis not present

## 2017-08-25 DIAGNOSIS — Z87891 Personal history of nicotine dependence: Secondary | ICD-10-CM | POA: Insufficient documentation

## 2017-08-25 DIAGNOSIS — Z79899 Other long term (current) drug therapy: Secondary | ICD-10-CM | POA: Insufficient documentation

## 2017-08-25 DIAGNOSIS — R0602 Shortness of breath: Secondary | ICD-10-CM | POA: Diagnosis not present

## 2017-08-25 DIAGNOSIS — R002 Palpitations: Secondary | ICD-10-CM | POA: Diagnosis not present

## 2017-08-25 DIAGNOSIS — Z853 Personal history of malignant neoplasm of breast: Secondary | ICD-10-CM | POA: Insufficient documentation

## 2017-08-25 DIAGNOSIS — I480 Paroxysmal atrial fibrillation: Secondary | ICD-10-CM | POA: Diagnosis not present

## 2017-08-25 DIAGNOSIS — Z96653 Presence of artificial knee joint, bilateral: Secondary | ICD-10-CM | POA: Diagnosis not present

## 2017-08-25 DIAGNOSIS — R079 Chest pain, unspecified: Secondary | ICD-10-CM | POA: Diagnosis not present

## 2017-08-25 LAB — CBC
HEMATOCRIT: 41.9 % (ref 36.0–46.0)
Hemoglobin: 13.7 g/dL (ref 12.0–15.0)
MCH: 30.9 pg (ref 26.0–34.0)
MCHC: 32.7 g/dL (ref 30.0–36.0)
MCV: 94.4 fL (ref 78.0–100.0)
PLATELETS: 266 10*3/uL (ref 150–400)
RBC: 4.44 MIL/uL (ref 3.87–5.11)
RDW: 13.4 % (ref 11.5–15.5)
WBC: 9.1 10*3/uL (ref 4.0–10.5)

## 2017-08-25 LAB — BASIC METABOLIC PANEL
Anion gap: 11 (ref 5–15)
BUN: 20 mg/dL (ref 6–20)
CO2: 26 mmol/L (ref 22–32)
CREATININE: 0.83 mg/dL (ref 0.44–1.00)
Calcium: 9 mg/dL (ref 8.9–10.3)
Chloride: 102 mmol/L (ref 101–111)
GFR calc non Af Amer: >60 mL/min (ref >60)
Glucose, Bld: 132 mg/dL — ABNORMAL HIGH (ref 65–99)
POTASSIUM: 3.4 mmol/L — AB (ref 3.5–5.1)
SODIUM: 139 mmol/L (ref 135–145)

## 2017-08-25 LAB — DIFFERENTIAL
BASOS ABS: 0 10*3/uL (ref 0.0–0.1)
Basophils Relative: 0 %
EOS ABS: 0.1 10*3/uL (ref 0.0–0.7)
Eosinophils Relative: 1 %
LYMPHS ABS: 2.3 10*3/uL (ref 0.7–4.0)
LYMPHS PCT: 25 %
MONOS PCT: 11 %
Monocytes Absolute: 1 10*3/uL (ref 0.1–1.0)
Neutro Abs: 5.8 10*3/uL (ref 1.7–7.7)
Neutrophils Relative %: 63 %

## 2017-08-25 LAB — HEPATIC FUNCTION PANEL
ALBUMIN: 3.6 g/dL (ref 3.5–5.0)
ALT: 33 U/L (ref 14–54)
AST: 23 U/L (ref 15–41)
Alkaline Phosphatase: 78 U/L (ref 38–126)
BILIRUBIN TOTAL: 0.7 mg/dL (ref 0.3–1.2)
Bilirubin, Direct: 0.1 mg/dL (ref 0.1–0.5)
Indirect Bilirubin: 0.6 mg/dL (ref 0.3–0.9)
TOTAL PROTEIN: 6.8 g/dL (ref 6.5–8.1)

## 2017-08-25 LAB — TROPONIN I: Troponin I: <0.03 ng/mL (ref <0.03)

## 2017-08-25 MED ORDER — DILTIAZEM HCL-DEXTROSE 100-5 MG/100ML-% IV SOLN (PREMIX)
5.0000 mg/h | Freq: Once | INTRAVENOUS | Status: AC
  Administered 2017-08-25: 5 mg/h via INTRAVENOUS
  Filled 2017-08-25: qty 100

## 2017-08-25 MED ORDER — DILTIAZEM HCL 25 MG/5ML IV SOLN
10.0000 mg | Freq: Once | INTRAVENOUS | Status: AC
  Administered 2017-08-25: 10 mg via INTRAVENOUS
  Filled 2017-08-25: qty 5

## 2017-08-25 MED ORDER — SODIUM CHLORIDE 0.9 % IV BOLUS (SEPSIS)
500.0000 mL | Freq: Once | INTRAVENOUS | Status: AC
  Administered 2017-08-25: 500 mL via INTRAVENOUS

## 2017-08-25 NOTE — ED Notes (Signed)
EKG given to Dr. Zammit 

## 2017-08-25 NOTE — ED Notes (Signed)
No sticks to left arm.

## 2017-08-25 NOTE — ED Notes (Signed)
Cardizem increased to 10mg /hr.  Patient resting in bed, call light in reach. No noted discomfort.

## 2017-08-25 NOTE — Discharge Instructions (Signed)
Increase your Lopressor to 50 mg twice a day.  Follow-up with the cardiologist in the next couple weeks.  Give them a call if they do not contact you in 2 days

## 2017-08-25 NOTE — ED Triage Notes (Signed)
Pt sent over by Dr. Legrand Rams for non-radiating CP, palpitations, and SOB. CP began today.

## 2017-08-25 NOTE — ED Provider Notes (Signed)
[facility] EMERGENCY DEPARTMENT Provider Note   CSN: [id number] Arrival date & time: 08/25/17  1113     History   Chief Complaint Chief Complaint  Patient presents with  . Chest Pain    HPI Stephanie Jarvis is a 67 y.o. female.  Patient complains of shortness of breath and chest pain and palpitations.   The history is provided by the patient.  Chest Pain   This is a new problem. The current episode started 3 to 5 hours ago. The problem occurs constantly. The problem has not changed since onset.The pain is associated with exertion. The pain is present in the substernal region. The pain is at a severity of 2/10. The pain is mild. The quality of the pain is described as dull. Associated symptoms include palpitations. Pertinent negatives include no abdominal pain, no back pain, no cough and no headaches.  Pertinent negatives for past medical history include no seizures.    Past Medical History:  Diagnosis Date  . Ankle pain   . Anxiety   . Arthritis   . Breast cancer ([place]) 2008   ILC of Left breast; ER+  . 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 Complication of anesthesia    Aspirates when having per patient- "everytime per her daughter"  DId not the last time, per patient  . Dyspnea    with exertion  . Dysrhythmia 2011   Atrial Fibrillation-PAF--not present since Metoprolol  . Family history of anesthesia complication   . GERD (gastroesophageal reflux disease)   . H/O hiatal hernia 2003  . Hepatitis    Hepatitis A when 67 years old  . High cholesterol   . History of blood transfusion    during chemo  . Irregular heartbeat   . Knee pain   . Pneumonia    12/23/16- many years ago  . Sciatica   . Snoring   . Vertigo     Patient Active Problem List   Diagnosis Date Noted  . Breast cancer ([place]) 01/26/2014  . Metatarsal fracture 10/19/2013  . PTTD (posterior tibial tendon dysfunction) 04/01/2012  . Pain in joint, lower  leg 09/18/2011  . Stiffness of joint, lower leg 09/18/2011  . Difficulty in walking(719.7) 09/18/2011  . Swelling of joint of left knee 09/18/2011  . OA (osteoarthritis) of knee 08/01/2011    Past Surgical History:  Procedure Laterality Date  . BREAST LUMPECTOMY Left 1982,2008   x 2   . CHOLECYSTECTOMY N/A 12/31/2016   Procedure: LAPAROSCOPIC CHOLECYSTECTOMY WITH POSSIBLE INTRAOPERATIVE CHOLANGIOGRAM;  Surgeon: Rolm Bookbinder, MD;  Location: [place];  Service: General;  Laterality: N/A;  . COLONOSCOPY    . HIATAL HERNIA REPAIR  2003  . lymph node removal  2008  . MASTECTOMY MODIFIED RADICAL Left 01/26/2014   Procedure: LEFT MODIFIED RADICAL MASTECTOMY;  Surgeon: Jamesetta So, MD;  Location: [facility];  Service: General;  Laterality: Left;  . PORT-A-CATH REMOVAL  2009 ish  . PORTA CATH INSERTION  [id number]  . TOTAL KNEE ARTHROPLASTY Right 2006  . TOTAL KNEE ARTHROPLASTY  08/12/2011   Procedure: TOTAL KNEE ARTHROPLASTY;  Surgeon: Arther Abbott, MD;  Location: [facility];  Service: Orthopedics;  Laterality: Left;  . TUBAL LIGATION  1980    OB History    No data available       Home Medications    Prior to Admission medications   Medication Sig Start Date End Date Taking? Authorizing Provider  albuterol (PROVENTIL) (2.5 MG/3ML) 0.083% nebulizer solution Inhale 3 mLs into the lungs every 4 (four) hours as needed. 08/15/17  Yes [provider]  anastrozole (ARIMIDEX) 1 MG tablet Take 1 tablet (1 mg total) by mouth daily. 03/12/17  Yes Twana First, MD  baclofen (LIORESAL) 20 MG tablet Take 1 tablet by mouth at bedtime as needed. 06/30/17  Yes [provider]  buPROPion (WELLBUTRIN XL) 300 MG 24 hr tablet Take 300 mg by mouth daily.   Yes [provider]  fexofenadine (ALLEGRA) 180 MG tablet Take 180 mg by mouth daily.   Yes [provider]  furosemide (LASIX) 40 MG tablet Take 40 mg by mouth 2 (two) times daily.    Yes [provider]  KLOR-CON  M20 20 MEQ tablet TAKE 1 TABLET TWICE A DAY 07/15/16  Yes Penland, Kelby Fam, MD  meloxicam (MOBIC) 7.5 MG tablet Take 15 mg by mouth daily.    Yes [provider]  metoprolol tartrate (LOPRESSOR) 25 MG tablet Take 12.5 mg by mouth 2 (two) times daily. Half a tablet twice daily   Yes [provider]  omeprazole (PRILOSEC) 20 MG capsule Take 20 mg by mouth 2 (two) times daily.    Yes [provider]  PROVENTIL HFA 108 (90 Base) MCG/ACT inhaler Inhale 2 puffs into the lungs every 6 (six) hours as needed for wheezing or shortness of breath.  06/29/15  Yes [provider]  rosuvastatin (CRESTOR) 20 MG tablet Take 20 mg by mouth at bedtime.  01/25/16  Yes [provider]  sertraline (ZOLOFT) 100 MG tablet Take 100 mg by mouth at bedtime.   Yes [provider]  azithromycin (ZITHROMAX) 250 MG tablet Take 1 tablet by mouth daily. 08/11/17   [provider]  benzonatate (TESSALON) 100 MG capsule Take 1 capsule by mouth 2 (two) times daily. 08/15/17   [provider]  predniSONE (DELTASONE) 10 MG tablet Take 1 tablet by mouth daily. 08/15/17   [provider]  PREMARIN vaginal cream PLACE 0.5 TO 1 GRAM VAGINALLY THREE TIMES A WEEK Patient not taking: Reported on 08/25/2017 12/11/15   Baird Cancer, PA-C    Family History Family History  Problem Relation Age of Onset  . Stroke Father   . Heart disease Father   . Skin cancer Sister        dx. 83-58; unknown type  . Throat cancer Maternal Uncle 75       smoker; voicebox removed  . Alzheimer's disease Paternal Grandmother   . Heart attack Paternal Grandfather   . Alzheimer's disease Maternal Aunt   . Heart disease Maternal Uncle   . Lung cancer Cousin        smoker  . Lung cancer Paternal Aunt        dx. mid-70s; worked at [employer]  . Heart attack Daughter        caused by thyroid issues  . Heart disease Unknown   . Arthritis Unknown   . Lung disease Unknown     . Cancer Unknown   . Kidney disease Unknown     Social History Social History   Tobacco Use  . Smoking status: Former Smoker    Packs/day: 3.00    Years: 25.00    Pack years: 75.00    Types: Cigarettes    Last attempt to quit: 06/30/1998    Years since quitting: 19.1  . Smokeless tobacco: Never Used  Substance Use Topics  . Alcohol  use: Yes    Alcohol/week: 0.0 oz    Comment: 2 glasses wine per month  . Drug use: No     Allergies   No known allergies   Review of Systems Review of Systems  Constitutional: Negative for appetite change and fatigue.  HENT: Negative for congestion, ear discharge and sinus pressure.   Eyes: Negative for discharge.  Respiratory: Negative for cough.   Cardiovascular: Positive for chest pain and palpitations.  Gastrointestinal: Negative for abdominal pain and diarrhea.  Genitourinary: Negative for frequency and hematuria.  Musculoskeletal: Negative for back pain.  Skin: Negative for rash.  Neurological: Negative for seizures and headaches.  Psychiatric/Behavioral: Negative for hallucinations.     Physical Exam Updated Vital Signs BP 128/71   Pulse 80   Temp 97.7 F (36.5 C) (Oral)   Resp 19   Ht 5' 6"  (1.676 m)   Wt 113.4 kg (250 lb)   SpO2 95%   BMI 40.35 kg/m   Physical Exam  Constitutional: She is oriented to person, place, and time. She appears well-developed.  HENT:  Head: Normocephalic.  Eyes: Conjunctivae and EOM are normal. No scleral icterus.  Neck: Neck supple. No thyromegaly present.  Cardiovascular: Exam reveals no gallop and no friction rub.  No murmur heard. Irregular rapid rate  Pulmonary/Chest: No stridor. She has no wheezes. She has no rales. She exhibits no tenderness.  Abdominal: She exhibits no distension. There is no tenderness. There is no rebound.  Musculoskeletal: Normal range of motion. She exhibits no edema.  Lymphadenopathy:    She has no cervical adenopathy.  Neurological: She is oriented to  person, place, and time. She exhibits normal muscle tone. Coordination normal.  Skin: No rash noted. No erythema.  Psychiatric: She has a normal mood and affect. Her behavior is normal.  Nursing note and vitals reviewed.    ED Treatments / Results  Labs (all labs ordered are listed, but only abnormal results are displayed) Labs Reviewed  BASIC METABOLIC PANEL - Abnormal; Notable for the following components:      Result Value   Potassium 3.4 (*)    Glucose, Bld 132 (*)    All other components within normal limits  CBC  TROPONIN I  HEPATIC FUNCTION PANEL  DIFFERENTIAL    EKG  EKG Interpretation  Date/Time:  Monday August 25 2017 11:20:42 EST Ventricular Rate:  150 PR Interval:    QRS Duration: 90 QT Interval:  296 QTC Calculation: 467 R Axis:   44 Text Interpretation:  Atrial fibrillation with rapid ventricular response with premature ventricular or aberrantly conducted complexes Low voltage QRS Nonspecific ST abnormality Abnormal ECG Confirmed by Milton Ferguson [phone]) on 08/25/2017 1:31:42 PM       Radiology Dg Chest 2 View  Result Date: 08/25/2017 CLINICAL DATA:  Chest pain, shortness breath, tachycardia beginning today. EXAM: CHEST  2 VIEW COMPARISON:  08/13/2017 FINDINGS: Stable mild cardiomegaly. Both lungs are clear. Prior left mastectomy again noted. IMPRESSION: Stable mild cardiomegaly.  No active lung disease. Electronically Signed   By: Earle Gell M.D.   On: 08/25/2017 11:49    Procedures Procedures (including critical care time)  Medications Ordered in ED Medications  sodium chloride 0.9 % bolus 500 mL (0 mLs Intravenous Stopped 08/25/17 1307)  diltiazem (CARDIZEM) injection 10 mg (10 mg Intravenous Given 08/25/17 1219)  diltiazem (CARDIZEM) 100 mg in dextrose 5% 155m (1 mg/mL) infusion (5 mg/hr Intravenous New Bag/Given 08/25/17 1223)     Initial Impression / Assessment and Plan /  ED Course  I have reviewed the triage vital signs and the nursing  notes.  Pertinent labs & imaging results that were available during my care of the patient were reviewed by me and considered in my medical decision making (see chart for details).     CRITICAL CARE Performed by: Milton Ferguson Total critical care time: 35 minutes Critical care time was exclusive of separately billable procedures and treating other patients. Critical care was necessary to treat or prevent imminent or life-threatening deterioration. Critical care was time spent personally by me on the following activities: development of treatment plan with patient and/or surrogate as well as nursing, discussions with consultants, evaluation of patient's response to treatment, examination of patient, obtaining history from patient or surrogate, ordering and performing treatments and interventions, ordering and review of laboratory studies, ordering and review of radiographic studies, pulse oximetry and re-evaluation of patient's condition.  Patient was in a rapid atrial fib at about 150 bpm.  Patient was given a bolus of Cardizem and placed on a drip of 5 mg and this was increased to 10 mg.  Eventually the patient converted to normal sinus rhythm and felt much better she was having no chest pain no shortness of breath at that time I spoke with cardiology and they suggested increasing the patient's Lopressor from 25 mg twice a day to 50 mg twice a day.  It was felt the patient will be discharged and cardiology will follow-up in the next week or 2.  No anticoagulants will be started at this point Final Clinical Impressions(s) / ED Diagnoses   Final diagnoses:  Paroxysmal atrial fibrillation [facility])    ED Discharge Orders    None       Milton Ferguson, MD 08/25/17 [phone]

## 2017-08-25 NOTE — ED Notes (Signed)
Patient to radiology.

## 2017-08-26 ENCOUNTER — Encounter: Payer: Self-pay | Admitting: Cardiology

## 2017-08-26 ENCOUNTER — Ambulatory Visit (INDEPENDENT_AMBULATORY_CARE_PROVIDER_SITE_OTHER): Payer: Medicare Other | Admitting: Cardiology

## 2017-08-26 VITALS — BP 118/78 | HR 94 | Ht 66.0 in | Wt 257.0 lb

## 2017-08-26 DIAGNOSIS — I429 Cardiomyopathy, unspecified: Secondary | ICD-10-CM

## 2017-08-26 DIAGNOSIS — I313 Pericardial effusion (noninflammatory): Secondary | ICD-10-CM

## 2017-08-26 DIAGNOSIS — Z853 Personal history of malignant neoplasm of breast: Secondary | ICD-10-CM | POA: Diagnosis not present

## 2017-08-26 DIAGNOSIS — I480 Paroxysmal atrial fibrillation: Secondary | ICD-10-CM | POA: Diagnosis not present

## 2017-08-26 DIAGNOSIS — I3139 Other pericardial effusion (noninflammatory): Secondary | ICD-10-CM

## 2017-08-26 NOTE — Progress Notes (Signed)
Cardiology Office Note  Date: 08/26/2017   ID: Stephanie Jarvis, DOB September 10, 1950, MRN [id number]  PCP: [name], MD  Consulting cardiologist: Rozann Lesches, MD   Chief Complaint  Patient presents with  . Atrial Fibrillation  . Cardiomyopathy    History of Present Illness: Stephanie Jarvis is a 67 y.o. female referred for cardiology consultation by Dr. Legrand Rams for evaluation of abnormal echocardiogram.  Record review finds that she was just seen in the ER yesterday with complaints of shortness of breath, chest discomfort, and palpitations.  She was found to be in rapid atrial fibrillation which spontaneously converted to sinus rhythm on intravenous Cardizem.  Case was discussed by phone with Dr. Bronson Ing with recommendation to increase Lopressor and follow-up as an outpatient.  She tells me that over the last few weeks she has been having shortness of breath with intermittent cough, sharp chest pain, a vague sense of palpitations intermittently.  She was treated for possible URI with outpatient antibiotics and also albuterol inhalers.  Also placed on Lasix with significant diuresis and weight loss which had presumably accumulated on a gradual course.  Recent echocardiogram on February 18 reported moderately reduced LV contraction with no LVEF reported, inferior/inferoseptal/lateral hypokinesis, also moderate sized pericardial effusion.  I discussed the results with her.  She reports no prior history of cardiomyopathy.  CHADSVASC score is 3.  We did discuss consideration for anticoagulation to reduce stroke risk, however our plan is to obtain more information first, specifically with follow-up on LVEF and pericardial effusion size.  She is followed in the oncology clinic with history of breast cancer as detailed below, status post chemotherapy (Taxotere and Cytoxan 2008) and radiation years ago and currently on Arimidex.  She states that she has been doing very well from this  perspective.  Past Medical History:  Diagnosis Date  . Ankle pain   . Anxiety   . Arthritis   . Breast cancer ([place]) 2008   ILC of Left breast; ER+  . 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 GERD (gastroesophageal reflux disease)   . H/O hiatal hernia 2003  . History of atrial fibrillation 2011  . History of blood transfusion   . History of hepatitis A    Age 14  . History of pneumonia   . Hyperlipidemia   . Knee pain   . Sciatica   . Vertigo     Past Surgical History:  Procedure Laterality Date  . BREAST LUMPECTOMY Left 1982,2008   x 2   . CHOLECYSTECTOMY N/A 12/31/2016   Procedure: LAPAROSCOPIC CHOLECYSTECTOMY WITH POSSIBLE INTRAOPERATIVE CHOLANGIOGRAM;  Surgeon: Rolm Bookbinder, MD;  Location: [place];  Service: General;  Laterality: N/A;  . COLONOSCOPY    . HIATAL HERNIA REPAIR  2003  . Lymph node removal  2008  . MASTECTOMY MODIFIED RADICAL Left 01/26/2014   Procedure: LEFT MODIFIED RADICAL MASTECTOMY;  Surgeon: Jamesetta So, MD;  Location: [facility];  Service: General;  Laterality: Left;  . PORT-A-CATH REMOVAL  2009 ish  . PORTA CATH INSERTION  [id number]  . TOTAL KNEE ARTHROPLASTY Right 2006  . TOTAL KNEE ARTHROPLASTY  08/12/2011   Procedure: TOTAL KNEE ARTHROPLASTY;  Surgeon: Arther Abbott, MD;  Location: [facility];  Service: Orthopedics;  Laterality: Left;  . TUBAL LIGATION  1980    Current Outpatient Medications  Medication Sig Dispense Refill  . albuterol (PROVENTIL) (2.5 MG/3ML) 0.083% nebulizer solution Inhale 3 mLs into the  lungs every 4 (four) hours as needed.    Marland Kitchen anastrozole (ARIMIDEX) 1 MG tablet Take 1 tablet (1 mg total) by mouth daily. 90 tablet 1  . baclofen (LIORESAL) 20 MG tablet Take 1 tablet by mouth at bedtime as needed.    . benzonatate (TESSALON) 100 MG capsule Take 1 capsule by mouth 2 (two) times daily.    Marland Kitchen buPROPion (WELLBUTRIN XL) 300 MG 24 hr tablet Take 300 mg by mouth daily.    . fexofenadine  (ALLEGRA) 180 MG tablet Take 180 mg by mouth daily.    . furosemide (LASIX) 40 MG tablet Take 40 mg by mouth 2 (two) times daily.     Marland Kitchen KLOR-CON M20 20 MEQ tablet TAKE 1 TABLET TWICE A DAY 180 tablet 3  . meloxicam (MOBIC) 7.5 MG tablet Take 15 mg by mouth daily.     . metoprolol tartrate (LOPRESSOR) 25 MG tablet Take 25 mg by mouth 2 (two) times daily.    Marland Kitchen omeprazole (PRILOSEC) 20 MG capsule Take 20 mg by mouth 2 (two) times daily.     Marland Kitchen PROVENTIL HFA 108 (90 Base) MCG/ACT inhaler Inhale 2 puffs into the lungs every 6 (six) hours as needed for wheezing or shortness of breath.     . rosuvastatin (CRESTOR) 20 MG tablet Take 20 mg by mouth at bedtime.     . sertraline (ZOLOFT) 100 MG tablet Take 100 mg by mouth at bedtime.     No current facility-administered medications for this visit.    Allergies:  No known allergies   Social History: The patient  reports that she quit smoking about 19 years ago. Her smoking use included cigarettes. She has a 75.00 pack-year smoking history. she has never used smokeless tobacco. She reports that she drinks alcohol. She reports that she does not use drugs.   Family History: The patient's family history includes Alzheimer's disease in her maternal aunt and paternal grandmother; Arthritis in her unknown relative; Cancer in her unknown relative; Heart attack in her daughter and paternal grandfather; Heart disease in her father, maternal uncle, and unknown relative; Kidney disease in her unknown relative; Lung cancer in her cousin and paternal aunt; Lung disease in her unknown relative; Skin cancer in her sister; Stroke in her father; Throat cancer (age of onset: 23) in her maternal uncle.   ROS:  Please see the history of present illness. Otherwise, complete review of systems is positive for intermittent palpitations, no syncope.  All other systems are reviewed and negative.   Physical Exam: VS:  BP 118/78 (BP Location: Right Arm) Comment (BP Location): cannot use  left arm  Pulse 94   Ht 5' 6"  (1.676 m)   Wt 257 lb (116.6 kg)   SpO2 97%   BMI 41.48 kg/m , BMI Body mass index is 41.48 kg/m.  Wt Readings from Last 3 Encounters:  08/26/17 257 lb (116.6 kg)  08/25/17 250 lb (113.4 kg)  03/17/17 257 lb (116.6 kg)    General: Obese woman, appears comfortable at rest. HEENT: Conjunctiva and lids normal, oropharynx clear. Neck: Supple, no elevated JVP or carotid bruits, no thyromegaly. Lungs: Clear to auscultation, nonlabored breathing at rest. Cardiac: Distant RRR,  no S3 or significant systolic murmur, no pericardial rub. Abdomen: Soft, nontender, bowel sounds present. Extremities: Trace ankle edema, distal pulses 1-2+. Skin: Warm and dry. Musculoskeletal: No kyphosis. Neuropsychiatric: Alert and oriented x3, affect grossly appropriate.  ECG: I personally reviewed the tracing from 08/25/2017 which showed sinus rhythm with  borderline prolonged PR interval.  Recent Labwork: 08/25/2017: ALT 33; AST 23; BUN 20; Creatinine, Ser 0.83; Hemoglobin 13.7; Platelets 266; Potassium 3.4; Sodium 139   Other Studies Reviewed Today:  Echocardiogram 08/18/2017: Study Conclusions  - Left ventricle: Poor acoustic windows Endocardium is not well   seein. LVEF is probably moderately depressed with inferior,   inferoseptal and lateral hypokinesis The cavity size was normal.   Wall thickness was increased in a pattern of mild LVH. - Left atrium: The atrium was mildly dilated. - Pericardium, extracardiac: Pericardial effusion surrounds heart   Most prominent in lateral, inferolateral region Measures 17 mm at   maximal dimension. NO evid of tamponade by echo criteria.  Chest x-ray 08/25/2017: FINDINGS: Stable mild cardiomegaly. Both lungs are clear. Prior left mastectomy again noted.  IMPRESSION: Stable mild cardiomegaly.  No active lung disease.  Assessment and Plan:  1.  Recently documented atrial fibrillation with RVR, spontaneously converted to sinus  rhythm on intravenous diltiazem.  Lopressor has been increased from 12.5 mg twice daily to 25 mg twice daily. CHADSVASC score is 3.  It sounds like she may have had atrial fibrillation documented in 2011 based on chart review, but she does not recall this specifically.  She reports having a vague sense of palpitations over the last few weeks.  Since trajectory of her current illness is not completely understood, I am reluctant to start her on anticoagulation at this time.  We will reconsider this as more information is obtained.  2.  Cardiomyopathy with moderately reduced LVEF, not further quantified by recent echocardiogram.  Could be nonischemic etiology such as tachycardia-mediated in the setting of atrial fibrillation or possibly viral based on her recent symptoms and associated pericardial effusion.  Plan is to obtain a an echocardiogram with Definity contrast in the next few weeks with office follow-up.  Further medication adjustments can be made from there.  3.  History of breast cancer several years ago status post radiation and also chemotherapy with Taxotere and Cytoxan 2008.  She states that she has done very well since then.  4.  Pericardial effusion by recent echocardiogram, possibly inflammatory.  This will be reassessed with follow-up echocardiogram.  Would like to see some resolution in this process prior to initiating anticoagulation for atrial fibrillation.  Current medicines were reviewed with the patient today.   Orders Placed This Encounter  Procedures  . ECHOCARDIOGRAM COMPLETE    Disposition: Follow-up in the next 3-4 weeks after echocardiogram.  Signed, Satira Sark, MD, [facility] 08/26/2017 3:25 PM     [facility] [address], [place], [place] [address] Phone: [phone]; Fax: [phone]

## 2017-08-26 NOTE — Patient Instructions (Addendum)
Your physician recommends that you schedule a follow-up appointment in:  3 -4 weeks     START Aspirin 81 mg daily   Continue all other medications    Your physician has requested that you have an echocardiogram in 2 weeks. Echocardiography is a painless test that uses sound waves to create images of your heart. It provides your doctor with information about the size and shape of your heart and how well your heart's chambers and valves are working. This procedure takes approximately one hour. There are no restrictions for this procedure.     No lab work ordered today.      Thank you for choosing [facility] !

## 2017-09-02 ENCOUNTER — Other Ambulatory Visit (HOSPITAL_COMMUNITY): Payer: Self-pay

## 2017-09-02 DIAGNOSIS — C50111 Malignant neoplasm of central portion of right female breast: Secondary | ICD-10-CM

## 2017-09-02 DIAGNOSIS — Z170 Estrogen receptor positive status [ER+]: Principal | ICD-10-CM

## 2017-09-03 ENCOUNTER — Inpatient Hospital Stay (HOSPITAL_COMMUNITY): Payer: Medicare Other | Attending: Internal Medicine

## 2017-09-03 DIAGNOSIS — C50111 Malignant neoplasm of central portion of right female breast: Secondary | ICD-10-CM

## 2017-09-03 DIAGNOSIS — C50912 Malignant neoplasm of unspecified site of left female breast: Secondary | ICD-10-CM | POA: Insufficient documentation

## 2017-09-03 DIAGNOSIS — Z170 Estrogen receptor positive status [ER+]: Secondary | ICD-10-CM

## 2017-09-03 LAB — CBC WITH DIFFERENTIAL/PLATELET
BASOS ABS: 0 10*3/uL (ref 0.0–0.1)
Basophils Relative: 0 %
EOS ABS: 0.2 10*3/uL (ref 0.0–0.7)
Eosinophils Relative: 3 %
HCT: 38.5 % (ref 36.0–46.0)
Hemoglobin: 12.5 g/dL (ref 12.0–15.0)
Lymphocytes Relative: 30 %
Lymphs Abs: 1.9 10*3/uL (ref 0.7–4.0)
MCH: 30.6 pg (ref 26.0–34.0)
MCHC: 32.5 g/dL (ref 30.0–36.0)
MCV: 94.1 fL (ref 78.0–100.0)
MONO ABS: 0.6 10*3/uL (ref 0.1–1.0)
MONOS PCT: 9 %
Neutro Abs: 3.7 10*3/uL (ref 1.7–7.7)
Neutrophils Relative %: 58 %
PLATELETS: 220 10*3/uL (ref 150–400)
RBC: 4.09 MIL/uL (ref 3.87–5.11)
RDW: 13.3 % (ref 11.5–15.5)
WBC: 6.3 10*3/uL (ref 4.0–10.5)

## 2017-09-03 LAB — COMPREHENSIVE METABOLIC PANEL
ALT: 24 U/L (ref 14–54)
ANION GAP: 11 (ref 5–15)
AST: 27 U/L (ref 15–41)
Albumin: 3.6 g/dL (ref 3.5–5.0)
Alkaline Phosphatase: 65 U/L (ref 38–126)
BUN: 13 mg/dL (ref 6–20)
CHLORIDE: 100 mmol/L — AB (ref 101–111)
CO2: 27 mmol/L (ref 22–32)
Calcium: 9.2 mg/dL (ref 8.9–10.3)
Creatinine, Ser: 1.17 mg/dL — ABNORMAL HIGH (ref 0.44–1.00)
GFR calc Af Amer: 54 mL/min — ABNORMAL LOW (ref >60)
GFR calc non Af Amer: 47 mL/min — ABNORMAL LOW (ref >60)
GLUCOSE: 117 mg/dL — AB (ref 65–99)
POTASSIUM: 4.3 mmol/L (ref 3.5–5.1)
SODIUM: 138 mmol/L (ref 135–145)
TOTAL PROTEIN: 6.8 g/dL (ref 6.5–8.1)
Total Bilirubin: 0.6 mg/dL (ref 0.3–1.2)

## 2017-09-09 ENCOUNTER — Ambulatory Visit (HOSPITAL_COMMUNITY)
Admission: RE | Admit: 2017-09-09 | Discharge: 2017-09-09 | Disposition: A | Discharge status: Home / Self Care | Payer: Medicare Other | Source: Ambulatory Visit | Attending: Cardiology | Admitting: Cardiology

## 2017-09-09 DIAGNOSIS — I313 Pericardial effusion (noninflammatory): Secondary | ICD-10-CM | POA: Diagnosis not present

## 2017-09-09 DIAGNOSIS — Z853 Personal history of malignant neoplasm of breast: Secondary | ICD-10-CM | POA: Diagnosis not present

## 2017-09-09 DIAGNOSIS — I3139 Other pericardial effusion (noninflammatory): Secondary | ICD-10-CM

## 2017-09-09 DIAGNOSIS — Z87891 Personal history of nicotine dependence: Secondary | ICD-10-CM | POA: Diagnosis not present

## 2017-09-09 MED ORDER — PERFLUTREN LIPID MICROSPHERE
1.0000 mL | INTRAVENOUS | Status: AC | PRN
  Administered 2017-09-09: 2 mL via INTRAVENOUS
  Filled 2017-09-09: qty 10

## 2017-09-09 NOTE — Progress Notes (Signed)
*  PRELIMINARY RESULTS* Echocardiogram 2D Echocardiogram WITH DEFINITY has been performed.  Leavy Cella 09/09/2017, 11:21 AM

## 2017-09-10 ENCOUNTER — Telehealth: Payer: Self-pay

## 2017-09-10 ENCOUNTER — Encounter (HOSPITAL_COMMUNITY): Payer: Self-pay | Admitting: Internal Medicine

## 2017-09-10 ENCOUNTER — Inpatient Hospital Stay (HOSPITAL_COMMUNITY): Payer: Medicare Other | Admitting: Internal Medicine

## 2017-09-10 ENCOUNTER — Other Ambulatory Visit (HOSPITAL_COMMUNITY): Payer: Self-pay | Admitting: Internal Medicine

## 2017-09-10 VITALS — BP 125/58 | HR 80 | Temp 97.8°F | Resp 18 | Wt 262.0 lb

## 2017-09-10 DIAGNOSIS — Z1231 Encounter for screening mammogram for malignant neoplasm of breast: Secondary | ICD-10-CM

## 2017-09-10 NOTE — Telephone Encounter (Signed)
Called pt. No answer. Left message for pt to return call.  

## 2017-09-10 NOTE — Telephone Encounter (Signed)
-----   Message from Satira Sark, MD sent at 09/09/2017 11:42 AM EDT ----- Results reviewed.  LVEF is normal by this study representing an improvement compared to prior evaluation.  Still has moderate sized pericardial effusion, would hold off starting anticoagulation at this time. A copy of this test should be forwarded to [name], MD.

## 2017-09-22 NOTE — Progress Notes (Signed)
Cardiology Office Note    Date:  09/23/2017   ID:  Stephanie Jarvis, DOB June 13, 1951, MRN [id number]  PCP:  [name], MD  Cardiologist: Rozann Lesches, MD    Chief Complaint  Patient presents with  . Follow-up    1 month visit    History of Present Illness:    Stephanie Jarvis is a 67 y.o. female with past medical history of PAF, history of breast cancer (s/p chemotherapy and radiation treatment in 2008, remains on Amiridex), HTN, and HLD who presents to the office today for 48-monthfollow-up.   She was recently evaluated by Dr. MDomenic Politeon 08/26/2017 following a recent emergency department visit for new onset shortness of breath and palpitations, at which time she was found to be in rapid atrial fibrillation and spontaneously converted to normal sinus rhythm. At the time of her office visit, she reported having intermittent shortness of breath and palpitations over the past few weeks and had been treated for a URI by her PCP. She had recently been started on Lasix due to fluid accumulation and an echocardiogram had been performed which showed a moderately decreased EF with pericardial effusion noted. She was continued on Lopressor 267mBID and was not yet started on anticoagulation due to her known effusion.   A repeat echocardiogram was obtained on 09/09/2017 and showed her EF was improved to 55-60% with trivial MR and trivial TR noted. Moderate-sized pericardial effusion was again present as outlined below. Was recommended to continue to hold off on initiation of anticoagulation at that time.   In talking with the patient today, she reports overall improvement in her symptoms since her last office visit.  She still has some dyspnea on exertion but this has improved. Denies any associated orthopnea, PND, lower extremity edema, chest pain, dizziness, lightheadedness, or presyncope. She does note occasional episodes of her heart "skipping a beat" but denies any tachypalpitations. Says  these episodes do not resemble her prior episodes of atrial fibrillation.    Past Medical History:  Diagnosis Date  . Ankle pain   . Anxiety   . Arthritis   . [facility] [id number]   [facility] of Left breast; ER+  . 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 GERD (gastroesophageal reflux disease)   . H/O hiatal hernia 2003  . History of atrial fibrillation 2011  . History of blood transfusion   . History of hepatitis A    Age 67. History of pneumonia   . Hyperlipidemia   . Knee pain   . Sciatica   . Vertigo     Past Surgical History:  Procedure Laterality Date  . BREAST LUMPECTOMY Left 1982,2008   x 2   . CHOLECYSTECTOMY N/A 12/31/2016   Procedure: LAPAROSCOPIC CHOLECYSTECTOMY WITH POSSIBLE INTRAOPERATIVE CHOLANGIOGRAM;  Surgeon: WaRolm BookbinderMD;  [facility] Service: General;  Laterality: N/A;  . COLONOSCOPY    . HIATAL HERNIA REPAIR  2003  . Lymph node removal  2008  . MASTECTOMY MODIFIED RADICAL Left 01/26/2014   Procedure: LEFT MODIFIED RADICAL MASTECTOMY;  Surgeon: MaJamesetta SoMD;  Location: [facility];  Service: General;  Laterality: Left;  . PORT-A-CATH REMOVAL  2009 ish  . PORTA CATH INSERTION  [id number]. TOTAL KNEE ARTHROPLASTY Right 2006  . TOTAL KNEE ARTHROPLASTY  08/12/2011   Procedure: TOTAL KNEE ARTHROPLASTY;  Surgeon: StArther AbbottMD;  Location: [facility];  Service: Orthopedics;  Laterality: Left;  . TUBAL LIGATION  1980    Current Medications: Outpatient Medications Prior to Visit  Medication Sig Dispense Refill  . albuterol (PROVENTIL) (2.5 MG/3ML) 0.083% nebulizer solution Inhale 3 mLs into the lungs every 4 (four) hours as needed.    Marland Kitchen anastrozole (ARIMIDEX) 1 MG tablet Take 1 tablet (1 mg total) by mouth daily. 90 tablet 1  . aspirin EC 81 MG tablet Take 81 mg by mouth daily.    . baclofen (LIORESAL) 20 MG tablet Take 1 tablet by mouth at bedtime as needed.    Marland Kitchen buPROPion (WELLBUTRIN XL) 300 MG 24 hr  tablet Take 300 mg by mouth daily.    . fexofenadine (ALLEGRA) 180 MG tablet Take 180 mg by mouth daily.    Marland Kitchen KLOR-CON M20 20 MEQ tablet TAKE 1 TABLET TWICE A DAY 180 tablet 3  . meloxicam (MOBIC) 7.5 MG tablet Take 15 mg by mouth daily.     Marland Kitchen omeprazole (PRILOSEC) 20 MG capsule Take 20 mg by mouth 2 (two) times daily.     Marland Kitchen PROVENTIL HFA 108 (90 Base) MCG/ACT inhaler Inhale 2 puffs into the lungs every 6 (six) hours as needed for wheezing or shortness of breath.     . rosuvastatin (CRESTOR) 20 MG tablet Take 20 mg by mouth at bedtime.     . sertraline (ZOLOFT) 100 MG tablet Take 100 mg by mouth at bedtime.    . furosemide (LASIX) 40 MG tablet Take 40 mg by mouth 2 (two) times daily.     . metoprolol tartrate (LOPRESSOR) 25 MG tablet Take 25 mg by mouth 2 (two) times daily.    . benzonatate (TESSALON) 100 MG capsule Take 1 capsule by mouth 2 (two) times daily.     No facility-administered medications prior to visit.      Allergies:   No known allergies   Social History   Socioeconomic History  . Marital status: Widowed    Spouse name: Not on file  . Number of children: 2  . Years of education: 14  . Highest education level: Not on file  Occupational History  . Occupation: retired  Scientific laboratory technician  . Financial resource strain: Not on file  . Food insecurity:    Worry: Not on file    Inability: Not on file  . Transportation needs:    Medical: Not on file    Non-medical: Not on file  Tobacco Use  . Smoking status: Former Smoker    Packs/day: 3.00    Years: 25.00    Pack years: 75.00    Types: Cigarettes    Last attempt to quit: 06/30/1998    Years since quitting: 19.2  . Smokeless tobacco: Never Used  Substance and Sexual Activity  . Alcohol use: Yes    Alcohol/week: 0.0 oz    Comment: 2 glasses wine per month  . Drug use: No  . Sexual activity: Yes    Birth control/protection: Surgical  Lifestyle  . Physical activity:    Days per week: Not on file    Minutes per  session: Not on file  . Stress: Not on file  Relationships  . Social connections:    Talks on phone: Not on file    Gets together: Not on file    Attends religious service: Not on file    Active member of club or organization: Not on file    Attends meetings of clubs or organizations: Not on file    Relationship status:  Not on file  Other Topics Concern  . Not on file  Social History Narrative   Drinks 2-3 cups of coffee a day      Family History:  The patient's family history includes Alzheimer's disease in her maternal aunt and paternal grandmother; Arthritis in her unknown relative; Cancer in her unknown relative; Heart attack in her daughter and paternal grandfather; Heart disease in her father, maternal uncle, and unknown relative; Kidney disease in her unknown relative; Lung cancer in her cousin and paternal aunt; Lung disease in her unknown relative; Skin cancer in her sister; Stroke in her father; Throat cancer (age of onset: 19) in her maternal uncle.   Review of Systems:   Please see the history of present illness.     General:  No chills, fever, night sweats or weight changes.  Cardiovascular:  No chest pain, edema, orthopnea, palpitations, paroxysmal nocturnal dyspnea. Positive for dyspnea on exertion.  Dermatological: No rash, lesions/masses Respiratory: No cough, dyspnea Urologic: No hematuria, dysuria Abdominal:   No nausea, vomiting, diarrhea, bright red blood per rectum, melena, or hematemesis Neurologic:  No visual changes, wkns, changes in mental status. All other systems reviewed and are otherwise negative except as noted above.   Physical Exam:    VS:  BP 118/76   Pulse 61   Ht 5' 6"  (1.676 m)   Wt 261 lb (118.4 kg)   SpO2 96%   BMI 42.13 kg/m    General: Well developed, well nourished Caucasian female appearing in no acute distress. Head: Normocephalic, atraumatic, sclera non-icteric, no xanthomas, nares are without discharge.  Neck: No carotid bruits.  JVD not elevated.  Lungs: Respirations regular and unlabored, without wheezes or rales.  Heart: Regular rate and rhythm. No S3 or S4.  No murmur, no rubs, or gallops appreciated. Abdomen: Soft, non-tender, non-distended with normoactive bowel sounds. No hepatomegaly. No rebound/guarding. No obvious abdominal masses. Msk:  Strength and tone appear normal for age. No joint deformities or effusions. Extremities: No clubbing or cyanosis. No lower extremity edema.  Distal pedal pulses are 2+ bilaterally. Neuro: Alert and oriented X 3. Moves all extremities spontaneously. No focal deficits noted. Psych:  Responds to questions appropriately with a normal affect. Skin: No rashes or lesions noted  Wt Readings from Last 3 Encounters:  09/23/17 261 lb (118.4 kg)  09/10/17 262 lb (118.8 kg)  08/26/17 257 lb (116.6 kg)      Studies/Labs Reviewed:   EKG:  EKG is not ordered today.    Recent Labs: 09/03/2017: ALT 24; BUN 13; Creatinine, Ser 1.17; Hemoglobin 12.5; Platelets 220; Potassium 4.3; Sodium 138   Lipid Panel No results found for: CHOL, TRIG, HDL, CHOLHDL, VLDL, LDLCALC, LDLDIRECT  Additional studies/ records that were reviewed today include:   Echocardiogram: 09/09/2017 Study Conclusions  - Left ventricle: The cavity size was normal. Wall thickness was   increased in a pattern of mild LVH. Systolic function was normal.   The estimated ejection fraction was in the range of 55% to 60%.   Although no diagnostic regional wall motion abnormality was   identified, this possibility cannot be completely excluded on the   basis of this study. No LV mural thrombus with Definity contrast.   Left ventricular diastolic function parameters were normal. - Aortic valve: Mildly calcified annulus. Trileaflet. - Mitral valve: There was trivial regurgitation. - Right atrium: Central venous pressure (est): 3 mm Hg. - Tricuspid valve: There was trivial regurgitation. - Pulmonary arteries: Systolic  pressure could not be  accurately   estimated. - Pericardium, extracardiac: A moderate pericardial effusion was   identified circumferential to the heart. There is evidence of   organization anteriorly. No right ventricular compromise evident,   There is respiratory mitral outflow variation suggesting some   potential hemodynamic significance, although patient clinically   stable and in sinus rhythm with normal heart rate and blood   pressure.  Echocardiogram: 08/2017 Study Conclusions  - Left ventricle: Poor acoustic windows Endocardium is not well   seein. LVEF is probably moderately depressed with inferior,   inferoseptal and lateral hypokinesis The cavity size was normal.   Wall thickness was increased in a pattern of mild LVH. - Left atrium: The atrium was mildly dilated. - Pericardium, extracardiac: Pericardial effusion surrounds heart   Most prominent in lateral, inferolateral region Measures 17 mm at   maximal dimension. NO evid of tamponade by echo criteria.  Assessment:    1. Pericardial effusion   2. Paroxysmal atrial fibrillation (HCC)   3. Essential hypertension      Plan:   In order of problems listed above:  1. Pericardial Effusion - Diagnosed with a pericardial effusion in 08/2017 following a recent URI. Repeat echocardiogram on 09/09/2017 showed her EF was improved to 55-60% with trivial MR and trivial TR noted. Moderate-sized pericardial effusion was again present as above.  - overall, she has noted improvement in her breathing and denies any associated chest pain, orthopnea, or PND. No evidence of tamponade by physical examination.  - will plan for repeat echo in 1 month for reassessment of her effusion. Will continue Lasix at 89m BID at this time (recent labs reviewed). Will plan for repeat lab work in 4 weeks at the time of her echo to reassess kidney function and K+ levels.   2. Paroxysmal Atrial Fibrillation - Noted to have her initial episode in 08/2017  with conversion back to NSR following administration of Cardizem. She does note occasional palpitations and reports this has happened for 10+ years but denies any sustained tachy-palpitations as she experienced during her ED visit.   - Will continue on Lopressor 25 mg twice daily. She has not yet been started on anticoagulation due to her pericardial effusion. She will continue on ASA 834mdaily at this time. Pending resolution of her effusion, would anticipate starting anticoagulation at that time which was reviewed with the patient today.  3. HTN - BP is well controlled at 118/76 during today's visit. - Continue current medication regimen.   Medication Adjustments/Labs and Tests Ordered: Current medicines are reviewed at length with the patient today.  Concerns regarding medicines are outlined above.  Medication changes, Labs and Tests ordered today are listed in the Patient Instructions below. Patient Instructions  Medication Instructions:  Your physician recommends that you continue on your current medications as directed. Please refer to the Current Medication list given to you today.  Labwork: NONE   Testing/Procedures: Your physician has requested that you have an echocardiogram. Echocardiography is a painless test that uses sound waves to create images of your heart. It provides your doctor with information about the size and shape of your heart and how well your heart's chambers and valves are working. This procedure takes approximately one hour. There are no restrictions for this procedure.   Follow-Up: Your physician recommends that you schedule a follow-up appointment in: May with Dr. McDomenic Polite  Any Other Special Instructions Will Be Listed Below (If Applicable).  If you need a refill on your cardiac medications before  your next appointment, please call your pharmacy. Thank you for choosing [facility]!     Signed, Erma Heritage, PA-C  09/23/2017 5:38 PM      [place] [address] [place], [place] [address] Phone: [phone]

## 2017-09-23 ENCOUNTER — Ambulatory Visit (INDEPENDENT_AMBULATORY_CARE_PROVIDER_SITE_OTHER): Payer: Medicare Other | Admitting: Student

## 2017-09-23 ENCOUNTER — Encounter: Payer: Self-pay | Admitting: Student

## 2017-09-23 VITALS — BP 118/76 | HR 61 | Ht 66.0 in | Wt 261.0 lb

## 2017-09-23 DIAGNOSIS — I3139 Other pericardial effusion (noninflammatory): Secondary | ICD-10-CM

## 2017-09-23 DIAGNOSIS — I313 Pericardial effusion (noninflammatory): Secondary | ICD-10-CM

## 2017-09-23 DIAGNOSIS — I10 Essential (primary) hypertension: Secondary | ICD-10-CM | POA: Diagnosis not present

## 2017-09-23 DIAGNOSIS — I480 Paroxysmal atrial fibrillation: Secondary | ICD-10-CM

## 2017-09-23 MED ORDER — METOPROLOL TARTRATE 25 MG PO TABS
25.0000 mg | ORAL_TABLET | Freq: Two times a day (BID) | ORAL | 3 refills | Status: DC
Start: 2017-09-23 — End: 2018-09-23

## 2017-09-23 MED ORDER — FUROSEMIDE 40 MG PO TABS
40.0000 mg | ORAL_TABLET | Freq: Two times a day (BID) | ORAL | 3 refills | Status: DC
Start: 2017-09-23 — End: 2018-09-23

## 2017-09-23 NOTE — Patient Instructions (Signed)
Medication Instructions:  Your physician recommends that you continue on your current medications as directed. Please refer to the Current Medication list given to you today.   Labwork: NONE   Testing/Procedures: Your physician has requested that you have an echocardiogram. Echocardiography is a painless test that uses sound waves to create images of your heart. It provides your doctor with information about the size and shape of your heart and how well your heart's chambers and valves are working. This procedure takes approximately one hour. There are no restrictions for this procedure.    Follow-Up: Your physician recommends that you schedule a follow-up appointment in: May with Dr. Domenic Polite.    Any Other Special Instructions Will Be Listed Below (If Applicable).     If you need a refill on your cardiac medications before your next appointment, please call your pharmacy. Thank you for choosing [facility]!

## 2017-09-24 ENCOUNTER — Ambulatory Visit: Payer: Medicare Other | Admitting: Cardiovascular Disease

## 2017-10-13 DIAGNOSIS — I480 Paroxysmal atrial fibrillation: Secondary | ICD-10-CM | POA: Diagnosis not present

## 2017-10-13 DIAGNOSIS — I313 Pericardial effusion (noninflammatory): Secondary | ICD-10-CM | POA: Diagnosis not present

## 2017-10-13 DIAGNOSIS — K219 Gastro-esophageal reflux disease without esophagitis: Secondary | ICD-10-CM | POA: Diagnosis not present

## 2017-10-28 ENCOUNTER — Ambulatory Visit (HOSPITAL_COMMUNITY)
Admission: RE | Admit: 2017-10-28 | Discharge: 2017-10-28 | Disposition: A | Discharge status: Home / Self Care | Payer: Medicare Other | Source: Ambulatory Visit | Attending: Student | Admitting: Student

## 2017-10-28 DIAGNOSIS — I313 Pericardial effusion (noninflammatory): Secondary | ICD-10-CM | POA: Diagnosis not present

## 2017-10-28 DIAGNOSIS — I3139 Other pericardial effusion (noninflammatory): Secondary | ICD-10-CM

## 2017-10-28 NOTE — Progress Notes (Signed)
*  PRELIMINARY RESULTS* Echocardiogram 2D Echocardiogram LIMITED has been performed.  Leavy Cella 10/28/2017, 9:03 AM

## 2017-10-29 ENCOUNTER — Telehealth: Payer: Self-pay | Admitting: Student

## 2017-10-29 NOTE — Telephone Encounter (Signed)
Called pt. Advised her that there were no labs ordered at [facility]. She voiced understanding. I reminded her of her appointment.

## 2017-10-29 NOTE — Telephone Encounter (Signed)
Patient inquiring if she was to have blood work ordered per [facility]. / tg

## 2017-11-10 ENCOUNTER — Encounter (HOSPITAL_COMMUNITY): Payer: Self-pay

## 2017-11-10 ENCOUNTER — Ambulatory Visit (HOSPITAL_COMMUNITY)
Admission: RE | Admit: 2017-11-10 | Discharge: 2017-11-10 | Disposition: A | Discharge status: Home / Self Care | Payer: Medicare Other | Source: Ambulatory Visit | Attending: Internal Medicine | Admitting: Internal Medicine

## 2017-11-10 DIAGNOSIS — Z1231 Encounter for screening mammogram for malignant neoplasm of breast: Secondary | ICD-10-CM | POA: Insufficient documentation

## 2017-11-14 ENCOUNTER — Inpatient Hospital Stay (HOSPITAL_COMMUNITY): Payer: Medicare Other | Attending: Internal Medicine | Admitting: Internal Medicine

## 2017-11-14 ENCOUNTER — Ambulatory Visit (HOSPITAL_COMMUNITY): Payer: Medicare Other | Admitting: Hematology

## 2017-11-14 ENCOUNTER — Encounter (HOSPITAL_COMMUNITY): Payer: Self-pay | Admitting: Internal Medicine

## 2017-11-14 ENCOUNTER — Other Ambulatory Visit: Payer: Self-pay

## 2017-11-14 VITALS — BP 123/65 | HR 82 | Temp 98.4°F | Resp 18 | Wt 261.8 lb

## 2017-11-14 DIAGNOSIS — Z170 Estrogen receptor positive status [ER+]: Secondary | ICD-10-CM | POA: Insufficient documentation

## 2017-11-14 DIAGNOSIS — Z780 Asymptomatic menopausal state: Secondary | ICD-10-CM | POA: Diagnosis not present

## 2017-11-14 DIAGNOSIS — Z79899 Other long term (current) drug therapy: Secondary | ICD-10-CM | POA: Diagnosis not present

## 2017-11-14 DIAGNOSIS — Z923 Personal history of irradiation: Secondary | ICD-10-CM | POA: Diagnosis not present

## 2017-11-14 DIAGNOSIS — Z853 Personal history of malignant neoplasm of breast: Secondary | ICD-10-CM | POA: Insufficient documentation

## 2017-11-14 DIAGNOSIS — Z7982 Long term (current) use of aspirin: Secondary | ICD-10-CM | POA: Insufficient documentation

## 2017-11-14 DIAGNOSIS — Z87891 Personal history of nicotine dependence: Secondary | ICD-10-CM | POA: Insufficient documentation

## 2017-11-14 DIAGNOSIS — Z9221 Personal history of antineoplastic chemotherapy: Secondary | ICD-10-CM

## 2017-11-14 DIAGNOSIS — C50112 Malignant neoplasm of central portion of left female breast: Secondary | ICD-10-CM

## 2017-11-14 DIAGNOSIS — Z79811 Long term (current) use of aromatase inhibitors: Secondary | ICD-10-CM | POA: Diagnosis not present

## 2017-11-14 MED ORDER — ANASTROZOLE 1 MG PO TABS: 1.0000 mg | ORAL_TABLET | Freq: Every day | ORAL | 3 refills | Status: DC

## 2017-11-14 NOTE — Patient Instructions (Addendum)
[place] at [facility] Discharge Instructions  Today you saw Dr. Walden Field. You will have your Bone Density in September and a follow up clinic appointment for results.    Thank you for choosing [place] at [facility] to provide your oncology and hematology care.  To afford each patient quality time with our provider, please arrive at least 15 minutes before your scheduled appointment time.   If you have a lab appointment with the [facility] please come in thru the  Main Entrance and check in at the main information desk  You need to re-schedule your appointment should you arrive 10 or more minutes late.  We strive to give you quality time with our providers, and arriving late affects you and other patients whose appointments are after yours.  Also, if you no show three or more times for appointments you may be dismissed from the clinic at the providers discretion.     Again, thank you for choosing [facility].  Our hope is that these requests will decrease the amount of time that you wait before being seen by our physicians.       _____________________________________________________________  Should you have questions after your visit to [facility], please contact our office at [phone] between the hours of 8:30 a.m. and 4:30 p.m.  Voicemails left after 4:30 p.m. will not be returned until the following business day.  For prescription refill requests, have your pharmacy contact our office.       Resources For Cancer Patients and their Caregivers ? American Cancer Society: Can assist with transportation, wigs, general needs, runs Look Good Feel Better.        6136865674 ? Cancer Care: Provides financial assistance, online support groups, medication/co-pay assistance.  1-800-813-HOPE (902) 192-3415) ? [facility] Assists [place] cancer patients and their families through emotional ,  educational and financial support.  414-287-3972 ? [place] DSS Where to apply for food stamps, Medicaid and utility assistance. 339-455-8124 ? [facility]: Transportation to medical appointments. 859-630-4856 ? Social Security Administration: May apply for disability if have a Stage IV cancer. 773-772-7462 631-180-5637 ? [employer], Disability and Transit Services: Assists with nutrition, care and transit needs. [facility] Support Programs:   > Cancer Support Group  2nd Tuesday of the month 1pm-2pm, Journey Room   > Creative Journey  3rd Tuesday of the month 1130am-1pm, Journey Room

## 2017-11-14 NOTE — Addendum Note (Signed)
Addended by: Farley Ly on: 11/14/2017 01:28 PM   Modules accepted: Orders

## 2017-11-14 NOTE — Progress Notes (Signed)
Diagnosis Malignant neoplasm of central portion of left breast in female, estrogen receptor positive ([place]) - Plan: CBC with Differential/Platelet, Comprehensive metabolic panel, Lactate dehydrogenase, DG Bone Density, CANCELED: DG Bone Density  Postmenopausal - Plan: DG Bone Density  Staging Cancer Staging Breast cancer ([place]) Staging form: Breast, AJCC 7th Edition - Clinical stage from 02/01/2014: Stage 0 (Tis (DCIS), N0, M0) - Signed by Baird Cancer, PA-C on 12/01/2015   Assessment and Plan: 1.  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 Intermediate Grade DCIS.    She had a remote history of stage I (T1c N0 M0) invasive lobular carcinoma of the left breast, status post lumpectomy, sentinel node biopsy, Oncotype DX score 35 with ER PR positive, HER-2/neu negative, treated with lumpectomy, 4 cycles of Taxotere/Cytoxan ending on 02/12/2007, radiotherapy to the left breast with anastrozole 1 mg daily from 01/2007 until 01/2012. (Treated in [place])  She was originally diagnosed with breast cancer in the left breast in 2008.  She was then diagnosed with a second breast cancer in the left breast in 2015. She has undergone a left mastectomy. She was started back on Arimidex in 2015 and continues to tolerate therapy.    Right screening mammogram done 11/10/2017 is negative.  She is recommended for repeat imaging in May 2020.  She should continue Arimidex as directed.  Prescription is E scribed to her pharmacy.  She will be set up for repeat bone density in September 2019 and will return to clinic for follow-up at that time.  2.  New onset Atrial fibrillation.  She should continue to follow with cardiology as directed.  3.  Health maintenance.  She reports she has undergone colonoscopy.  Follow-up with GI as directed.  Bone density will be set up for September 2019 and she will  follow-up to go over the results.     Interval history:  Stage I (Ta N0M0) ER/PR positive, HER-2/neu unknown, (insufficiency tumor for Her-2 status) Ki-67 of 4%, status post left simple mastectomy, no evidence of disease. Intermediate Grade DCIS.    She had a remote history of stage I (T1c N0 M0) invasive lobular carcinoma of the left breast, status post lumpectomy, sentinel node biopsy, Oncotype DX score 35 with ER PR positive, HER-2/neu negative, treated with lumpectomy, 4 cycles of Taxotere/Cytoxan ending on 02/12/2007, radiotherapy to the left breast with anastrozole 1 mg daily from 01/2007 until 01/2012. (Treated in [place])  She was originally diagnosed with breast cancer in the left breast in 2008. She was treated with surgery, chemotherapy and radiation at that time. She states she took Arimidex for approximately 5 years.   She was then diagnosed with a second breast cancer in the left breast in 2015. She has undergone a left mastectomy. She was started back on Arimidex in 2015.    Last BMD was done 03/13/2016 and was normal.    Current status: Patient is seen today for follow-up.  She is here to go over recent mammogram.  She reports she has been diagnosed with new onset atrial fibrillation and is followed by cardiology.  She needs a refill for Arimidex to be sent to [facility].     Problem List Patient Active Problem List   Diagnosis Date Noted  . Breast cancer ([place]) [C50.919] 01/26/2014  . Metatarsal fracture [S92.309A] 10/19/2013  . PTTD (posterior tibial tendon dysfunction) [E42.353] 04/01/2012  . Pain in joint,  lower leg [M25.569] 09/18/2011  . Stiffness of joint, lower leg [M25.669] 09/18/2011  . Difficulty in walking(719.7) [R26.2] 09/18/2011  . Swelling of joint of left knee [M25.462] 09/18/2011  . OA (osteoarthritis) of knee [M17.10] 08/01/2011    Past Medical History Past Medical History:  Diagnosis Date  . Ankle pain   . Anxiety   . Arthritis   . Breast  cancer ([place]) 2008   ILC of Left breast; ER+  . Breast cancer ([place]) 2015   IDC+DCIS of Left breast; ER/PR+, Her2-, Ki67 = 4%  . GERD (gastroesophageal reflux disease)   . H/O hiatal hernia 2003  . History of atrial fibrillation 2011  . History of blood transfusion   . History of hepatitis A    Age 36  . History of pneumonia   . Hyperlipidemia   . Knee pain   . Sciatica   . Vertigo     Past Surgical History Past Surgical History:  Procedure Laterality Date  . BREAST LUMPECTOMY Left 1982,2008   x 2   . CHOLECYSTECTOMY N/A 12/31/2016   Procedure: LAPAROSCOPIC CHOLECYSTECTOMY WITH POSSIBLE INTRAOPERATIVE CHOLANGIOGRAM;  Surgeon: Rolm Bookbinder, MD;  Location: [facility];  Service: General;  Laterality: N/A;  . COLONOSCOPY    . HIATAL HERNIA REPAIR  2003  . Lymph node removal  2008  . MASTECTOMY MODIFIED RADICAL Left 01/26/2014   Procedure: LEFT MODIFIED RADICAL MASTECTOMY;  Surgeon: Jamesetta So, MD;  Location: [facility];  Service: General;  Laterality: Left;  . PORT-A-CATH REMOVAL  2009 ish  . PORTA CATH INSERTION  [id number]  . TOTAL KNEE ARTHROPLASTY Right 2006  . TOTAL KNEE ARTHROPLASTY  08/12/2011   Procedure: TOTAL KNEE ARTHROPLASTY;  Surgeon: Arther Abbott, MD;  Location: [facility];  Service: Orthopedics;  Laterality: Left;  . TUBAL LIGATION  1980    Family History Family History  Problem Relation Age of Onset  . Stroke Father   . Heart disease Father   . Skin cancer Sister        dx. 38-58; unknown type  . Throat cancer Maternal Uncle 75       smoker; voicebox removed  . Alzheimer's disease Paternal Grandmother   . Heart attack Paternal Grandfather   . Alzheimer's disease Maternal Aunt   . Heart disease Maternal Uncle   . Lung cancer Cousin        smoker  . Lung cancer Paternal Aunt        dx. mid-70s; worked at [employer]  . Heart attack Daughter        caused by thyroid issues  . Heart disease Unknown   . Arthritis Unknown   . Lung disease Unknown   .  Cancer Unknown   . Kidney disease Unknown      Social History  reports that she quit smoking about 19 years ago. Her smoking use included cigarettes. She has a 75.00 pack-year smoking history. She has never used smokeless tobacco. She reports that she drinks alcohol. She reports that she does not use drugs.  Medications  Current Outpatient Medications:  .  albuterol (PROVENTIL) (2.5 MG/3ML) 0.083% nebulizer solution, Inhale 3 mLs into the lungs every 4 (four) hours as needed., Disp: , Rfl:  .  anastrozole (ARIMIDEX) 1 MG tablet, Take 1 tablet (1 mg total) by mouth daily., Disp: 90 tablet, Rfl: 1 .  aspirin EC 81 MG tablet, Take 81 mg by mouth daily., Disp: , Rfl:  .  baclofen (LIORESAL) 20 MG tablet, Take 1  tablet by mouth at bedtime as needed., Disp: , Rfl:  .  buPROPion (WELLBUTRIN XL) 300 MG 24 hr tablet, Take 300 mg by mouth daily., Disp: , Rfl:  .  fexofenadine (ALLEGRA) 180 MG tablet, Take 180 mg by mouth daily., Disp: , Rfl:  .  furosemide (LASIX) 40 MG tablet, Take 1 tablet (40 mg total) by mouth 2 (two) times daily., Disp: 180 tablet, Rfl: 3 .  KLOR-CON M20 20 MEQ tablet, TAKE 1 TABLET TWICE A DAY, Disp: 180 tablet, Rfl: 3 .  meloxicam (MOBIC) 7.5 MG tablet, Take 15 mg by mouth daily. , Disp: , Rfl:  .  metoprolol tartrate (LOPRESSOR) 25 MG tablet, Take 1 tablet (25 mg total) by mouth 2 (two) times daily., Disp: 180 tablet, Rfl: 3 .  omeprazole (PRILOSEC) 20 MG capsule, Take 20 mg by mouth 2 (two) times daily. , Disp: , Rfl:  .  PROVENTIL HFA 108 (90 Base) MCG/ACT inhaler, Inhale 2 puffs into the lungs every 6 (six) hours as needed for wheezing or shortness of breath. , Disp: , Rfl:  .  rosuvastatin (CRESTOR) 20 MG tablet, Take 20 mg by mouth at bedtime. , Disp: , Rfl:  .  sertraline (ZOLOFT) 100 MG tablet, Take 100 mg by mouth at bedtime., Disp: , Rfl:   Allergies No known allergies  Review of Systems Review of Systems - Oncology ROS as per HPI otherwise 12 point ROS is  negative.   Physical Exam  Vitals Wt Readings from Last 3 Encounters:  11/14/17 261 lb 12.8 oz (118.8 kg)  09/23/17 261 lb (118.4 kg)  09/10/17 262 lb (118.8 kg)   Temp Readings from Last 3 Encounters:  11/14/17 98.4 F (36.9 C) (Oral)  09/10/17 97.8 F (36.6 C) (Oral)  08/25/17 97.7 F (36.5 C) (Oral)   BP Readings from Last 3 Encounters:  11/14/17 123/65  09/23/17 118/76  09/10/17 (!) 125/58   Pulse Readings from Last 3 Encounters:  11/14/17 82  09/23/17 61  09/10/17 80   Constitutional: Well-developed, well-nourished, and in no distress.   HENT: Head: Normocephalic and atraumatic.  Mouth/Throat: No oropharyngeal exudate. Mucosa moist. Eyes: Pupils are equal, round, and reactive to light. Conjunctivae are normal. No scleral icterus.  Neck: Normal range of motion. Neck supple. No JVD present.  Cardiovascular: Normal rate, regular rhythm and normal heart sounds.  Exam reveals no gallop and no friction rub.   No murmur heard. Pulmonary/Chest: Effort normal and breath sounds normal. No respiratory distress. No wheezes.No rales.  Abdominal: Soft. Bowel sounds are normal. No distension. There is no tenderness. There is no guarding.  Musculoskeletal: No edema or tenderness.  Lymphadenopathy: No cervical, axillary or supraclavicular adenopathy.  Neurological: Alert and oriented to person, place, and time. No cranial nerve deficit.  Skin: Skin is warm and dry. No rash noted. No erythema. No pallor.  Psychiatric: Affect and judgment normal.  Bilateral breast exam: Chaperone present.  Left mastectomy no nodules or skin changes noted.  Right breast shows no dominant masses.  Labs No visits with results within 3 Day(s) from this visit.  Latest known visit with results is:  Appointment on 09/03/2017  Component Date Value Ref Range Status  . WBC 09/03/2017 6.3  4.0 - 10.5 K/uL Final  . RBC 09/03/2017 4.09  3.87 - 5.11 MIL/uL Final  . Hemoglobin 09/03/2017 12.5  12.0 - 15.0 g/dL  Final  . HCT 09/03/2017 38.5  36.0 - 46.0 % Final  . MCV 09/03/2017 94.1  78.0 -  100.0 fL Final  . MCH 09/03/2017 30.6  26.0 - 34.0 pg Final  . MCHC 09/03/2017 32.5  30.0 - 36.0 g/dL Final  . RDW 09/03/2017 13.3  11.5 - 15.5 % Final  . Platelets 09/03/2017 220  150 - 400 K/uL Final  . Neutrophils Relative % 09/03/2017 58  % Final  . Neutro Abs 09/03/2017 3.7  1.7 - 7.7 K/uL Final  . Lymphocytes Relative 09/03/2017 30  % Final  . Lymphs Abs 09/03/2017 1.9  0.7 - 4.0 K/uL Final  . Monocytes Relative 09/03/2017 9  % Final  . Monocytes Absolute 09/03/2017 0.6  0.1 - 1.0 K/uL Final  . Eosinophils Relative 09/03/2017 3  % Final  . Eosinophils Absolute 09/03/2017 0.2  0.0 - 0.7 K/uL Final  . Basophils Relative 09/03/2017 0  % Final  . Basophils Absolute 09/03/2017 0.0  0.0 - 0.1 K/uL Final   Performed at [facility], [address] [place], [place] [address]  . Sodium 09/03/2017 138  135 - 145 mmol/L Final  . Potassium 09/03/2017 4.3  3.5 - 5.1 mmol/L Final  . Chloride 09/03/2017 100* 101 - 111 mmol/L Final  . CO2 09/03/2017 27  22 - 32 mmol/L Final  . Glucose, Bld 09/03/2017 117* 65 - 99 mg/dL Final  . BUN 09/03/2017 13  6 - 20 mg/dL Final  . Creatinine, Ser 09/03/2017 1.17* 0.44 - 1.00 mg/dL Final  . Calcium 09/03/2017 9.2  8.9 - 10.3 mg/dL Final  . Total Protein 09/03/2017 6.8  6.5 - 8.1 g/dL Final  . Albumin 09/03/2017 3.6  3.5 - 5.0 g/dL Final  . AST 09/03/2017 27  15 - 41 U/L Final  . ALT 09/03/2017 24  14 - 54 U/L Final  . Alkaline Phosphatase 09/03/2017 65  38 - 126 U/L Final  . Total Bilirubin 09/03/2017 0.6  0.3 - 1.2 mg/dL Final  . GFR calc non Af Amer 09/03/2017 47* >60 mL/min Final  . GFR calc Af Amer 09/03/2017 54* >60 mL/min Final   Comment: (NOTE) The eGFR has been calculated using the CKD EPI equation. This calculation has not been validated in all clinical situations. eGFR's persistently <60 mL/min signify possible Chronic Kidney Disease.   Georgiann Hahn gap 09/03/2017  11  5 - 15 Final   Performed at [facility], [address], [place], [place] [address]     Pathology Orders Placed This Encounter  Procedures  . DG Bone Density    Standing Status:   Future    Standing Expiration Date:   11/14/2018    Order Specific Question:   Reason for Exam (SYMPTOM  OR DIAGNOSIS REQUIRED)    Answer:   left breast cancer, postmenopausal    Order Specific Question:   Preferred imaging location?    Answer:   [facility]  . CBC with Differential/Platelet    Standing Status:   Future    Standing Expiration Date:   11/15/2018  . Comprehensive metabolic panel    Standing Status:   Future    Standing Expiration Date:   11/15/2018  . Lactate dehydrogenase    Standing Status:   Future    Standing Expiration Date:   11/15/2018       Zoila Shutter MD

## 2017-11-18 NOTE — Progress Notes (Signed)
Cardiology Office Note  Date: 11/19/2017   ID: Stephanie Jarvis, DOB 1950-08-18, MRN [id number]  PCP: [name], MD  Primary Cardiologist: Rozann Lesches, MD   Chief Complaint  Patient presents with  . PAF    History of Present Illness: Stephanie Jarvis is a 67 y.o. female last seen by Ms. Strader PA-C in March.  She presents for a routine follow-up visit.  Complains of fatigue, rare sense of palpitations, but not necessarily increasing shortness of breath, no orthopnea, dizziness, or syncope.  Follow-up limited echocardiogram in April showed LVEF 55 to 60% range with stable moderate sized pericardial effusion.  Discussed these results with her.  She has continued on Lasix at 40 mg twice daily with potassium supplements.  We have not started anticoagulation as yet, CHADSVASC score of 3.  I personally reviewed her ECG today which shows normal sinus rhythm with sinus arrhythmia, borderline low voltage in the precordial leads and poor R wave progression.  Past Medical History:  Diagnosis Date  . Ankle pain   . Anxiety   . Arthritis   . Breast cancer ([place]) 2008   ILC of Left breast; ER+  . Breast cancer ([place]) 2015   IDC+DCIS of Left breast; ER/PR+, Her2-, Ki67 = 4%  . GERD (gastroesophageal reflux disease)   . H/O hiatal hernia 2003  . History of atrial fibrillation 2011  . History of blood transfusion   . History of hepatitis A    Age 20  . History of pneumonia   . Hyperlipidemia   . Knee pain   . Sciatica   . Vertigo     Past Surgical History:  Procedure Laterality Date  . BREAST LUMPECTOMY Left 1982,2008   x 2   . CHOLECYSTECTOMY N/A 12/31/2016   Procedure: LAPAROSCOPIC CHOLECYSTECTOMY WITH POSSIBLE INTRAOPERATIVE CHOLANGIOGRAM;  Surgeon: Rolm Bookbinder, MD;  Location: [facility];  Service: General;  Laterality: N/A;  . COLONOSCOPY    . HIATAL HERNIA REPAIR  2003  . Lymph node removal  2008  . MASTECTOMY MODIFIED RADICAL Left 01/26/2014   Procedure: LEFT  MODIFIED RADICAL MASTECTOMY;  Surgeon: Jamesetta So, MD;  Location: [facility];  Service: General;  Laterality: Left;  . PORT-A-CATH REMOVAL  2009 ish  . PORTA CATH INSERTION  [id number]  . TOTAL KNEE ARTHROPLASTY Right 2006  . TOTAL KNEE ARTHROPLASTY  08/12/2011   Procedure: TOTAL KNEE ARTHROPLASTY;  Surgeon: Arther Abbott, MD;  Location: [facility];  Service: Orthopedics;  Laterality: Left;  . TUBAL LIGATION  1980    Current Outpatient Medications  Medication Sig Dispense Refill  . albuterol (PROVENTIL) (2.5 MG/3ML) 0.083% nebulizer solution Inhale 3 mLs into the lungs every 4 (four) hours as needed.    Marland Kitchen anastrozole (ARIMIDEX) 1 MG tablet Take 1 tablet (1 mg total) by mouth daily. 90 tablet 3  . aspirin EC 81 MG tablet Take 81 mg by mouth daily.    . baclofen (LIORESAL) 20 MG tablet Take 1 tablet by mouth at bedtime as needed.    Marland Kitchen buPROPion (WELLBUTRIN XL) 300 MG 24 hr tablet Take 300 mg by mouth daily.    . fexofenadine (ALLEGRA) 180 MG tablet Take 180 mg by mouth daily.    . furosemide (LASIX) 40 MG tablet Take 1 tablet (40 mg total) by mouth 2 (two) times daily. 180 tablet 3  . KLOR-CON M20 20 MEQ tablet TAKE 1 TABLET TWICE A DAY 180 tablet 3  . meloxicam (MOBIC) 7.5 MG tablet Take 15  mg by mouth daily.     . metoprolol tartrate (LOPRESSOR) 25 MG tablet Take 1 tablet (25 mg total) by mouth 2 (two) times daily. 180 tablet 3  . omeprazole (PRILOSEC) 20 MG capsule Take 20 mg by mouth 2 (two) times daily.     Marland Kitchen PROVENTIL HFA 108 (90 Base) MCG/ACT inhaler Inhale 2 puffs into the lungs every 6 (six) hours as needed for wheezing or shortness of breath.     . rosuvastatin (CRESTOR) 20 MG tablet Take 20 mg by mouth at bedtime.     . sertraline (ZOLOFT) 100 MG tablet Take 100 mg by mouth at bedtime.     No current facility-administered medications for this visit.    Allergies:  No known allergies   Social History: The patient  reports that she quit smoking about 19 years ago. Her smoking use  included cigarettes. She has a 75.00 pack-year smoking history. She has never used smokeless tobacco. She reports that she drinks alcohol. She reports that she does not use drugs.   Family History: The patient's family history includes Alzheimer's disease in her maternal aunt and paternal grandmother; Arthritis in her unknown relative; Cancer in her unknown relative; Heart attack in her daughter and paternal grandfather; Heart disease in her father, maternal uncle, and unknown relative; Kidney disease in her unknown relative; Lung cancer in her cousin and paternal aunt; Lung disease in her unknown relative; Skin cancer in her sister; Stroke in her father; Throat cancer (age of onset: 71) in her maternal uncle.   ROS:  Please see the history of present illness. Otherwise, complete review of systems is positive for none.  All other systems are reviewed and negative.   Physical Exam: VS:  BP 126/76   Pulse 68   Ht 5' 6"  (1.676 m)   Wt 263 lb (119.3 kg)   SpO2 97%   BMI 42.45 kg/m , BMI Body mass index is 42.45 kg/m.  Wt Readings from Last 3 Encounters:  11/19/17 263 lb (119.3 kg)  11/14/17 261 lb 12.8 oz (118.8 kg)  09/23/17 261 lb (118.4 kg)    General: Obese woman, appears comfortable at rest. HEENT: Conjunctiva and lids normal, oropharynx clear. Neck: Supple, no elevated JVP or carotid bruits, no thyromegaly. Lungs: Clear to auscultation, nonlabored breathing at rest. Cardiac: Regular rate and rhythm, no S3 or significant systolic murmur, no pericardial rub. Abdomen: Soft, nontender, bowel sounds present. Extremities: Trace ankle edema, distal pulses 2+. Skin: Warm and dry. Musculoskeletal: No kyphosis. Neuropsychiatric: Alert and oriented x3, affect grossly appropriate.  ECG: I personally reviewed the tracing from 08/25/2017 which showed rapid atrial fibrillation/flutter with nonspecific ST changes.  Recent Labwork: 09/03/2017: ALT 24; AST 27; BUN 13; Creatinine, Ser 1.17; Hemoglobin  12.5; Platelets 220; Potassium 4.3; Sodium 138   Other Studies Reviewed Today:  Limited echocardiogram 10/28/2017: Study Conclusions  - Left ventricle: The cavity size was normal. Wall thickness was   increased in a pattern of mild LVH. Systolic function was normal.   The estimated ejection fraction was in the range of 55% to 60%.   Wall motion was normal; there were no regional wall motion   abnormalities. Left ventricular diastolic function parameters   were normal. - Mitral valve: Mildly calcified annulus. - Inferior vena cava: The vessel was normal in size. The   respirophasic diameter changes were in the normal range (>= 50%),   consistent with normal central venous pressure. - Pericardium, extracardiac: A moderate size circumferential   pericardial  effusion is noted. Features were not consistent with   frank tamponade physiology.  Assessment and Plan:  1.  Moderate sized, circumferential pericardial effusion, persistent as of April.  Plan to continue Lasix with potassium supplements, follow-up BMET.  We will obtain a limited echocardiogram prior to visit in 3 months.  2.  Paroxysmal atrial fibrillation, currently in sinus rhythm.  CHADSVASC score is 3.  We will continue aspirin and beta-blocker, hold off on initiating anticoagulation pending reassessment of pericardial effusion.  Current medicines were reviewed with the patient today.   Orders Placed This Encounter  Procedures  . Basic metabolic panel  . ECHOCARDIOGRAM LIMITED    Disposition: Follow-up in 3 months.  Signed, Satira Sark, MD, [facility] 11/19/2017 2:08 PM    [facility] [place], [place], [place] [address] Phone: [phone]; Fax: [phone]

## 2017-11-19 ENCOUNTER — Ambulatory Visit (INDEPENDENT_AMBULATORY_CARE_PROVIDER_SITE_OTHER): Payer: Medicare Other | Admitting: Cardiology

## 2017-11-19 ENCOUNTER — Encounter: Payer: Self-pay | Admitting: Cardiology

## 2017-11-19 VITALS — BP 126/76 | HR 68 | Ht 66.0 in | Wt 263.0 lb

## 2017-11-19 DIAGNOSIS — I313 Pericardial effusion (noninflammatory): Secondary | ICD-10-CM | POA: Diagnosis not present

## 2017-11-19 DIAGNOSIS — I480 Paroxysmal atrial fibrillation: Secondary | ICD-10-CM | POA: Diagnosis not present

## 2017-11-19 DIAGNOSIS — I3139 Other pericardial effusion (noninflammatory): Secondary | ICD-10-CM

## 2017-11-19 NOTE — Patient Instructions (Signed)
Medication Instructions:  Your physician recommends that you continue on your current medications as directed. Please refer to the Current Medication list given to you today.  Labwork:  BMET  Orders given today   Testing/Procedures: Your physician has requested that you have an echocardiogram IN 3 MONTHS. Echocardiography is a painless test that uses sound waves to create images of your heart. It provides your doctor with information about the size and shape of your heart and how well your heart's chambers and valves are working. This procedure takes approximately one hour. There are no restrictions for this procedure.  Follow-Up: Your physician recommends that you schedule a follow-up appointment in: 3 MONTHS WITH DR. MCDOWELL  Any Other Special Instructions Will Be Listed Below (If Applicable).  If you need a refill on your cardiac medications before your next appointment, please call your pharmacy.

## 2017-11-21 NOTE — Addendum Note (Signed)
Addended by: Laurine Blazer on: 11/21/2017 09:01 AM   Modules accepted: Orders

## 2017-11-25 ENCOUNTER — Other Ambulatory Visit (HOSPITAL_COMMUNITY)
Admission: RE | Admit: 2017-11-25 | Discharge: 2017-11-25 | Disposition: A | Discharge status: Home / Self Care | Payer: Medicare Other | Source: Ambulatory Visit | Attending: Cardiology | Admitting: Cardiology

## 2017-11-25 DIAGNOSIS — I480 Paroxysmal atrial fibrillation: Secondary | ICD-10-CM | POA: Insufficient documentation

## 2017-11-25 LAB — BASIC METABOLIC PANEL
ANION GAP: 10 (ref 5–15)
BUN: 14 mg/dL (ref 6–20)
CALCIUM: 9.3 mg/dL (ref 8.9–10.3)
CO2: 29 mmol/L (ref 22–32)
Chloride: 99 mmol/L — ABNORMAL LOW (ref 101–111)
Creatinine, Ser: 0.82 mg/dL (ref 0.44–1.00)
Glucose, Bld: 133 mg/dL — ABNORMAL HIGH (ref 65–99)
Potassium: 4 mmol/L (ref 3.5–5.1)
SODIUM: 138 mmol/L (ref 135–145)

## 2017-11-26 ENCOUNTER — Telehealth: Payer: Self-pay

## 2017-11-26 NOTE — Telephone Encounter (Signed)
LMTCB

## 2017-11-26 NOTE — Telephone Encounter (Signed)
-----   Message from Merlene Laughter, LPN sent at 1/51/8343  7:34 AM EDT -----   ----- Message ----- From: Satira Sark, MD Sent: 11/25/2017  10:16 AM To: [name], MD, Merlene Laughter, LPN  Results reviewed.  Renal function and potassium are normal.  Continue with current plan. A copy of this test should be forwarded to [name], MD.

## 2017-11-26 NOTE — Telephone Encounter (Signed)
Patient notified. Routed to PCP 

## 2017-12-09 ENCOUNTER — Encounter: Payer: Self-pay | Admitting: Neurology

## 2017-12-11 ENCOUNTER — Encounter: Payer: Self-pay | Admitting: Neurology

## 2017-12-11 ENCOUNTER — Ambulatory Visit (INDEPENDENT_AMBULATORY_CARE_PROVIDER_SITE_OTHER): Payer: Medicare Other | Admitting: Neurology

## 2017-12-11 VITALS — BP 136/59 | HR 78 | Ht 64.0 in | Wt 262.0 lb

## 2017-12-11 DIAGNOSIS — Z9989 Dependence on other enabling machines and devices: Secondary | ICD-10-CM | POA: Diagnosis not present

## 2017-12-11 DIAGNOSIS — G4733 Obstructive sleep apnea (adult) (pediatric): Secondary | ICD-10-CM

## 2017-12-11 NOTE — Progress Notes (Signed)
Subjective:    Patient ID: Stephanie Jarvis is a 67 y.o. female.  HPI     Interim history:   Stephanie Jarvis is a 81 year old right-handed woman with an underlying medical history of paroxysmal A. Fib, breast cancer with history of recurrence, hyperlipidemia, vertigo, right foot pain, severe obesity, allergies, hypertension, depression, hyperlipidemia, and reflux disease, who presents for follow-up consultation of her moderate to severe obstructive sleep apnea, on treatment with CPAP. The patient is unaccompanied today. I last saw her on 12/11/2016, at which time she was compliant with her CPAP. She was advised to follow-up in one year.  Today, 12/11/2017: I reviewed her CPAP compliance data from 11/10/2017 through 12/09/2017 which is a total of 30 days, during which time she used her CPAP every night with percent used days greater than 4 hours at 100%, indicating superb compliance with an average usage of 8 hours and 39 minutes, residual AHI at goal at 1.9 per hour, leak acceptable with the 95th percentile at 10.5 L/m on a pressure of 10 cm with EPR of 3. She reports doing well, no issues with CPAP machine or supplies, DME is [facility] which keeps her up to date with her supplies. She had an episode of A. fib recently. She endorses having had some stress, she and her husband moved to a smaller home. Otherwise, cancer follow-up has been good.  The patient's allergies, current medications, family history, past medical history, past social history, past surgical history and problem list were reviewed and updated as appropriate.    Previously (copied from previous notes for reference):   I saw her on 12/11/2015, at which time she was compliant with CPAP, was overall doing quite well with it, some mask discomfort. I suggested one-year checkup she was doing well from my end of things.   I reviewed her CPAP compliance data from 11/10/2016 through 12/09/2016 which is a total of 30 days, during  which time she used her CPAP every night with percent used days greater than 4 hours at 100%, indicating superb compliance with an average usage of 9 hours and 12 minutes, which is quite extensive, average AHI 3.2 per hour, leak low with the 95th percentile at 4.4 L/m on a pressure of 10 cm with EPR of 3.    I saw her on 06/12/2015, at which time she reported feeling better after CPAP therapy was initiated. She reported feeling more rested and less daytime somnolence. She had adjusted fairly well. Overall, she was quite pleased with how she was doing.I did increase her CPAP pressure to 10 cm last time because of a borderline residual AHI at the time.   I reviewed her CPAP compliance data from 11/07/2015 through 12/06/2015 which is a total of 30 days during which time she used her machine 28 days with percent used days greater than 4 hours at 90%, indicating excellent compliance with an average usage of 7 hours and 53 minutes, residual AHI 2.6 per hour, leak low with the 95th percentile at 4.8 L/m on a pressure of 10 cm with EPR of 3.   I first met her on 02/10/2015 at the request of her primary care physician, at which time the patient reported snoring and excessive daytime somnolence for over 2 years. She was invited back for sleep study. She had a baseline sleep study, followed by a CPAP titration study, and I went over her test results with her in detail today. Her baseline sleep study from 03/09/2015 showed a  sleep efficiency of 79.3% with a prolonged sleep latency of 56 minutes and wake after sleep onset of 43.5 minutes with mild to moderate sleep fragmentation noted. She had an elevated arousal index, primarily secondary to respiratory events. She had an increased percentage of stage II sleep, slow-wave sleep at 14.8%, and a decreased percentage of REM sleep at 7.7% with a mildly prolonged REM latency of 131 minutes. She had moderate PLMS with an index of 26.1 per hour, resulting in 8.2 arousals per  hour. She had no significant EKG or EEG changes. She had mild to moderate snoring. Total AHI was 21.8 per hour, rising to 30.5 per hour during REM sleep and 35.5 per hour in the supine position, average oxygen saturation was 94%, nadir was 74% during REM sleep. Based on her test results I invited her back for a CPAP titration study. She had this on 04/09/2015. Sleep efficiency was 78.2%, latency to sleep prolonged at 55.5 minutes and wake after sleep onset was 41.5 minutes with mild to moderate sleep fragmentation noted. She had a mildly elevated arousal index, an increased percentage of stage II sleep, absence of slow-wave sleep and a decreased percentage of REM sleep at 50% with a mildly prolonged REM latency of 142 minutes. She had severe PLMS with an index of 73.3 per hour, resulting in 7.2 arousals per hour. She had no significant EKG or EEG changes. CPAP was titrated from 5 cm to 9 cm. AHI was 1.8 per hour at the final pressure. Average oxygen saturation was 92%, nadir was 85%. Based on her test results I prescribed CPAP therapy for home use.   I reviewed her CPAP compliance data from 05/09/2015 through 06/07/2015 which is a total of 30 days during which time she used her machine every night with percent used days greater than 4 hours at 100%, indicating superb compliance with an average usage of 8 hours and 50 minutes, residual AHI borderline at 4.8 per hour, leak low with the 95th percentile at 2.9 L/m and a pressure of 9 cm with EPR of 3.   02/10/2015: She reports snoring and excessive daytime somnolence. Symptoms have been ongoing for 2+ plus years. She moved in with Stephanie Jarvis in 4/16. She quit smoking in 1999 and drinks alcohol occasionally and drinks caffeine in the form of coffee, 2 cups in the morning and 2 in the afternoon. She has a TV in her bedroom but does not typically watch it. She is a restless sleeper. She has woken herself up with a sense of gasping and Stephanie Jarvis has noted pauses in her  breathing. She denies morning headaches but endorses nocturia, typically 2-3 times each night. She goes to bed around 10 PM and falling asleep is not a problem. She has several nighttime awakenings but is able to go back to sleep. Her rise time is usually between 7 and 8 AM. She does not set an alarm. She is retired. 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 Her mother had obstructive sleep apnea. She endorses some restless leg symptoms and is known to move a lot in her sleep. Snoring can be allowed per Stephanie Jarvis. She does not wake up well rested. Her Epworth sleepiness score is 14 out of 24 today, fatigue score is 51 out of 63 today. She has right ankle pain. She takes oxycodone as needed, not daily.  Her Past Medical History Is Significant For: Past Medical History:  Diagnosis Date  . Ankle pain   . Anxiety   .  Arthritis   . Breast cancer ([place]) 2008   ILC of Left breast; ER+  . 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 GERD (gastroesophageal reflux disease)   . H/O hiatal hernia 2003  . History of atrial fibrillation 2011  . History of blood transfusion   . History of hepatitis A    Age 1  . History of pneumonia   . Hyperlipidemia   . Knee pain   . Sciatica   . Vertigo     Her Past Surgical History Is Significant For: Past Surgical History:  Procedure Laterality Date  . BREAST LUMPECTOMY Left 1982,2008   x 2   . CHOLECYSTECTOMY N/A 12/31/2016   Procedure: LAPAROSCOPIC CHOLECYSTECTOMY WITH POSSIBLE INTRAOPERATIVE CHOLANGIOGRAM;  Surgeon: Rolm Bookbinder, MD;  Location: [facility];  Service: General;  Laterality: N/A;  . COLONOSCOPY    . HIATAL HERNIA REPAIR  2003  . Lymph node removal  2008  . MASTECTOMY MODIFIED RADICAL Left 01/26/2014   Procedure: LEFT MODIFIED RADICAL MASTECTOMY;  Surgeon: Jamesetta So, MD;  Location: [facility];  Service: General;  Laterality: Left;  . PORT-A-CATH REMOVAL  2009 ish  . PORTA CATH INSERTION   [id number]  . TOTAL KNEE ARTHROPLASTY Right 2006  . TOTAL KNEE ARTHROPLASTY  08/12/2011   Procedure: TOTAL KNEE ARTHROPLASTY;  Surgeon: Arther Abbott, MD;  Location: [facility];  Service: Orthopedics;  Laterality: Left;  . TUBAL LIGATION  1980    Her Family History Is Significant For: Family History  Problem Relation Age of Onset  . Stroke Father   . Heart disease Father   . Skin cancer Sister        dx. 98-58; unknown type  . Throat cancer Maternal Uncle 75       smoker; voicebox removed  . Alzheimer's disease Paternal Grandmother   . Heart attack Paternal Grandfather   . Alzheimer's disease Maternal Aunt   . Heart disease Maternal Uncle   . Lung cancer Cousin        smoker  . Lung cancer Paternal Aunt        dx. mid-70s; worked at [employer]  . Heart attack Daughter        caused by thyroid issues  . Heart disease Unknown   . Arthritis Unknown   . Lung disease Unknown   . Cancer Unknown   . Kidney disease Unknown     Her Social History Is Significant For: Social History   Socioeconomic History  . Marital status: Widowed    Spouse name: Not on file  . Number of children: 2  . Years of education: 57  . Highest education level: Not on file  Occupational History  . Occupation: retired  Scientific laboratory technician  . Financial resource strain: Not on file  . Food insecurity:    Worry: Not on file    Inability: Not on file  . Transportation needs:    Medical: Not on file    Non-medical: Not on file  Tobacco Use  . Smoking status: Former Smoker    Packs/day: 3.00    Years: 25.00    Pack years: 75.00    Types: Cigarettes    Last attempt to quit: 06/30/1998    Years since quitting: 19.4  . Smokeless tobacco: Never Used  Substance and Sexual Activity  . Alcohol use: Yes    Alcohol/week: 0.0 oz    Comment: 2 glasses wine per month  . Drug  use: No  . Sexual activity: Yes    Birth control/protection: Surgical  Lifestyle  . Physical activity:    Days per week: Not on file     Minutes per session: Not on file  . Stress: Not on file  Relationships  . Social connections:    Talks on phone: Not on file    Gets together: Not on file    Attends religious service: Not on file    Active member of club or organization: Not on file    Attends meetings of clubs or organizations: Not on file    Relationship status: Not on file  Other Topics Concern  . Not on file  Social History Narrative   Drinks 2-3 cups of coffee a day     Her Allergies Are:  Allergies  Allergen Reactions  . No Known Allergies   :   Her Current Medications Are:  Outpatient Encounter Medications as of 12/11/2017  Medication Sig  . albuterol (PROVENTIL) (2.5 MG/3ML) 0.083% nebulizer solution Inhale 3 mLs into the lungs every 4 (four) hours as needed.  Marland Kitchen anastrozole (ARIMIDEX) 1 MG tablet Take 1 tablet (1 mg total) by mouth daily.  Marland Kitchen aspirin EC 81 MG tablet Take 81 mg by mouth daily.  . baclofen (LIORESAL) 20 MG tablet Take 1 tablet by mouth at bedtime as needed.  Marland Kitchen buPROPion (WELLBUTRIN XL) 300 MG 24 hr tablet Take 300 mg by mouth daily.  . fexofenadine (ALLEGRA) 180 MG tablet Take 180 mg by mouth daily.  . furosemide (LASIX) 40 MG tablet Take 1 tablet (40 mg total) by mouth 2 (two) times daily.  Marland Kitchen KLOR-CON M20 20 MEQ tablet TAKE 1 TABLET TWICE A DAY  . meloxicam (MOBIC) 7.5 MG tablet Take 15 mg by mouth daily.   . metoprolol tartrate (LOPRESSOR) 25 MG tablet Take 1 tablet (25 mg total) by mouth 2 (two) times daily.  Marland Kitchen omeprazole (PRILOSEC) 20 MG capsule Take 20 mg by mouth 2 (two) times daily.   Marland Kitchen PROVENTIL HFA 108 (90 Base) MCG/ACT inhaler Inhale 2 puffs into the lungs every 6 (six) hours as needed for wheezing or shortness of breath.   . rosuvastatin (CRESTOR) 20 MG tablet Take 20 mg by mouth at bedtime.   . sertraline (ZOLOFT) 100 MG tablet Take 100 mg by mouth at bedtime.   No facility-administered encounter medications on file as of 12/11/2017.   :  Review of Systems:  Out of a  complete 14 point review of systems, all are reviewed and negative with the exception of these symptoms as listed below: Review of Systems  Neurological:       Pt presents today to discuss her cpap, which she reports is going well. Pt has been recently diagnosed with afib.    Objective:  Neurological Exam  Physical Exam Physical Examination:   Vitals:   12/11/17 0818  BP: (!) 136/59  Pulse: 78    General Examination: The patient is a very pleasant 67 y.o. female in no acute distress. She appears well-developed and well-nourished and well groomed.   HEENT:Normocephalic, atraumatic, pupils are equal, round and reactive to light and accommodation. Extraocular tracking is good without limitation to gaze excursion or nystagmus noted. Normal smooth pursuit is noted. Hearing is grossly intact. Face is symmetric with normal facial animation and normal facial sensation. Speech is clear with no dysarthria noted. There is no hypophonia. There is no lip, neck/head, jaw or voice tremor. Neck shows FROM. Oropharynx exam reveals: mild  mouth dryness, unchanged findings.   Chest:Clear to auscultation without wheezing, rhonchi or crackles noted.  Heart:S1+S2+0, regular and normal without murmurs, rubs or gallops noted.   Abdomen:Soft, non-tender and non-distended with normal bowel sounds appreciated on auscultation.  Extremities:There is no pitting edema in the distal lower extremities bilaterally, puffy feet.   Skin: Warm and dry without trophic changes noted. There are no varicose veins.  Musculoskeletal: exam reveals no obvious joint deformities, tenderness or joint swelling or erythema.   Neurologically:  Mental status: The patient is awake, alert and oriented in all 4 spheres. Her immediate and remote memory, attention, language skills and fund of knowledge are appropriate. There is no evidence of aphasia, agnosia, apraxia or anomia. Speech is clear with normal prosody and  enunciation. Thought process is linear. Mood is normal and affect is normal.  Cranial nerves II - XII are as described above under HEENT exam.  Motor exam: Normal bulk, strength and tone is noted. There is no tremor. Fine motor skills and coordination: intact grossly.   Cerebellar testing: No dysmetria or intention tremor on finger to nose testing. There is no truncal or gait ataxia.  Sensory exam: intact to light touch in the upper and lower extremities.  Gait, station and balance: She stands easily. No veering to one side is noted. No leaning to one side is noted. Posture is age-appropriate and stance is narrow based. Gait shows normal stride length and normal pace. No problems turning are noted.   Assessment and Plan:  In summary, Stephanie Jarvis is a very pleasant 67 year old female with an underlying medical history of paroxysmal A. Fib, breast cancer, with history of recurrence, hyperlipidemia, vertigo, right foot pain, morbid obesity, allergies, hypertension, depression, hyperlipidemia, and reflux disease, whopresents for follow-up consultation of her obstructive sleep apnea, well established on CPAP. She had a baseline sleep study on 03/09/2015. We increased her CPAP pressure to 10 cm in 2016 and since then her AHI is at goal. She is using a nasal mask. She is commended for her treatment adherence, and I suggested a one-year checkup, she can see one of our NPs next time. She is up To date with her supplies through [facility]. Physical exam is stable, weight has been stable, she is reminded to work on weight loss. She is advised to be well hydrated with water. I answered all her questions today and  She was in agreement. I spent 20 minutes in total face-to-face time with the patient, more than 50% of which was spent in counseling and coordination of care, reviewing test results, reviewing medication and discussing or reviewing the diagnosis of OSA, its prognosis and treatment options. Pertinent  laboratory and imaging test results that were available during this visit with the patient were reviewed by me and considered in my medical decision making (see chart for details).

## 2017-12-11 NOTE — Patient Instructions (Signed)

## 2017-12-29 DIAGNOSIS — I480 Paroxysmal atrial fibrillation: Secondary | ICD-10-CM | POA: Diagnosis not present

## 2017-12-29 DIAGNOSIS — M5431 Sciatica, right side: Secondary | ICD-10-CM | POA: Diagnosis not present

## 2018-01-06 ENCOUNTER — Ambulatory Visit: Payer: Medicare Other | Admitting: Orthopaedic Surgery

## 2018-01-08 ENCOUNTER — Ambulatory Visit (INDEPENDENT_AMBULATORY_CARE_PROVIDER_SITE_OTHER): Payer: Medicare Other | Admitting: Orthopaedic Surgery

## 2018-01-08 ENCOUNTER — Encounter: Payer: Self-pay | Admitting: Orthopaedic Surgery

## 2018-01-08 VITALS — BP 128/72 | HR 80 | Temp 97.4°F | Ht 64.0 in | Wt 268.0 lb

## 2018-01-08 DIAGNOSIS — G8929 Other chronic pain: Secondary | ICD-10-CM

## 2018-01-08 DIAGNOSIS — M7061 Trochanteric bursitis, right hip: Secondary | ICD-10-CM

## 2018-01-08 DIAGNOSIS — M5441 Lumbago with sciatica, right side: Secondary | ICD-10-CM

## 2018-01-08 DIAGNOSIS — Z6841 Body Mass Index (BMI) 40.0 and over, adult: Secondary | ICD-10-CM | POA: Diagnosis not present

## 2018-01-08 NOTE — Progress Notes (Signed)
Subjective:    Patient ID: Stephanie Jarvis, female    DOB: May 06, 1951, 67 y.o.   MRN: [id number]  HPI She has long history of lower back pain and right sided sciatica that flares up now and then.  She drove to [place] to see grandchildren and great-grandchildren a couple of weeks ago.  On return her sciatica flared up.  She called Dr. Legrand Rams but he is out of town on vacation.  She was given Gabapentin to take and that has helped but her right hip is very tender over the trochanteric area and her sciatica is more active now.  She has no weakness, no redness, no numbness.   Review of Systems  Constitutional: Positive for activity change.  Musculoskeletal: Positive for arthralgias, back pain and gait problem.  All other systems reviewed and are negative.  For Review of Systems, all other systems reviewed and are negative.  Past Medical History:  Diagnosis Date  . Ankle pain   . Anxiety   . Arthritis   . Breast cancer ([place]) 2008   ILC of Left breast; ER+  . 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 GERD (gastroesophageal reflux disease)   . H/O hiatal hernia 2003  . History of atrial fibrillation 2011  . History of blood transfusion   . History of hepatitis A    Age 65  . History of pneumonia   . Hyperlipidemia   . Knee pain   . Sciatica   . Vertigo     Past Surgical History:  Procedure Laterality Date  . BREAST LUMPECTOMY Left 1982,2008   x 2   . CHOLECYSTECTOMY N/A 12/31/2016   Procedure: LAPAROSCOPIC CHOLECYSTECTOMY WITH POSSIBLE INTRAOPERATIVE CHOLANGIOGRAM;  Surgeon: Rolm Bookbinder, MD;  Location: [facility];  Service: General;  Laterality: N/A;  . COLONOSCOPY    . HIATAL HERNIA REPAIR  2003  . Lymph node removal  2008  . MASTECTOMY MODIFIED RADICAL Left 01/26/2014   Procedure: LEFT MODIFIED RADICAL MASTECTOMY;  Surgeon: Jamesetta So, MD;  [facility];  Service: General;  Laterality: Left;  . PORT-A-CATH REMOVAL  2009 ish    . PORTA CATH INSERTION  [id number]  . TOTAL KNEE ARTHROPLASTY Right 2006  . TOTAL KNEE ARTHROPLASTY  08/12/2011   Procedure: TOTAL KNEE ARTHROPLASTY;  Surgeon: Arther Abbott, MD;  Location: [facility];  Service: Orthopedics;  Laterality: Left;  . TUBAL LIGATION  1980    Current Outpatient Medications on File Prior to Visit  Medication Sig Dispense Refill  . anastrozole (ARIMIDEX) 1 MG tablet Take 1 tablet (1 mg total) by mouth daily. 90 tablet 3  . aspirin EC 81 MG tablet Take 81 mg by mouth daily.    . baclofen (LIORESAL) 20 MG tablet Take 1 tablet by mouth at bedtime as needed.    Marland Kitchen buPROPion (WELLBUTRIN XL) 300 MG 24 hr tablet Take 300 mg by mouth daily.    . fexofenadine (ALLEGRA) 180 MG tablet Take 180 mg by mouth daily.    . furosemide (LASIX) 40 MG tablet Take 1 tablet (40 mg total) by mouth 2 (two) times daily. 180 tablet 3  . gabapentin (NEURONTIN) 600 MG tablet     . KLOR-CON M20 20 MEQ tablet TAKE 1 TABLET TWICE A DAY 180 tablet 3  . meloxicam (MOBIC) 7.5 MG tablet Take 15 mg by mouth daily.     . metoprolol tartrate (LOPRESSOR) 25 MG tablet Take 1 tablet (25  mg total) by mouth 2 (two) times daily. 180 tablet 3  . omeprazole (PRILOSEC) 20 MG capsule Take 20 mg by mouth 2 (two) times daily.     Marland Kitchen PROVENTIL HFA 108 (90 Base) MCG/ACT inhaler Inhale 2 puffs into the lungs every 6 (six) hours as needed for wheezing or shortness of breath.     . rosuvastatin (CRESTOR) 20 MG tablet Take 20 mg by mouth at bedtime.     . sertraline (ZOLOFT) 100 MG tablet Take 100 mg by mouth at bedtime.    Marland Kitchen albuterol (PROVENTIL) (2.5 MG/3ML) 0.083% nebulizer solution Inhale 3 mLs into the lungs every 4 (four) hours as needed.     No current facility-administered medications on file prior to visit.     Social History   Socioeconomic History  . Marital status: Widowed    Spouse name: Not on file  . Number of children: 2  . Years of education: 45  . Highest education level: Not on file  Occupational  History  . Occupation: retired  Scientific laboratory technician  . Financial resource strain: Not on file  . Food insecurity:    Worry: Not on file    Inability: Not on file  . Transportation needs:    Medical: Not on file    Non-medical: Not on file  Tobacco Use  . Smoking status: Former Smoker    Packs/day: 3.00    Years: 25.00    Pack years: 75.00    Types: Cigarettes    Last attempt to quit: 06/30/1998    Years since quitting: 19.5  . Smokeless tobacco: Never Used  Substance and Sexual Activity  . Alcohol use: Yes    Alcohol/week: 0.0 oz    Comment: 2 glasses wine per month  . Drug use: No  . Sexual activity: Yes    Birth control/protection: Surgical  Lifestyle  . Physical activity:    Days per week: Not on file    Minutes per session: Not on file  . Stress: Not on file  Relationships  . Social connections:    Talks on phone: Not on file    Gets together: Not on file    Attends religious service: Not on file    Active member of club or organization: Not on file    Attends meetings of clubs or organizations: Not on file    Relationship status: Not on file  . Intimate partner violence:    Fear of current or ex partner: Not on file    Emotionally abused: Not on file    Physically abused: Not on file    Forced sexual activity: Not on file  Other Topics Concern  . Not on file  Social History Narrative   Drinks 2-3 cups of coffee a day     Family History  Problem Relation Age of Onset  . Stroke Father   . Heart disease Father   . Skin cancer Sister        dx. 28-58; unknown type  . Throat cancer Maternal Uncle 75       smoker; voicebox removed  . Alzheimer's disease Paternal Grandmother   . Heart attack Paternal Grandfather   . Alzheimer's disease Maternal Aunt   . Heart disease Maternal Uncle   . Lung cancer Cousin        smoker  . Lung cancer Paternal Aunt        dx. mid-70s; worked at [employer]  . Heart attack Daughter  caused by thyroid issues  . Heart  disease Unknown   . Arthritis Unknown   . Lung disease Unknown   . Cancer Unknown   . Kidney disease Unknown     BP 128/72   Pulse 80   Temp (!) 97.4 F (36.3 C)   Ht 5' 4" (1.626 m)   Wt 268 lb (121.6 kg)   BMI 46.00 kg/m   Body mass index is 46 kg/m.      Objective:   Physical Exam  Constitutional: She is oriented to person, place, and time. She appears well-developed and well-nourished.  HENT:  Head: Normocephalic and atraumatic.  Eyes: Pupils are equal, round, and reactive to light. Conjunctivae and EOM are normal.  Neck: Normal range of motion. Neck supple.  Cardiovascular: Normal rate, regular rhythm and intact distal pulses.  Pulmonary/Chest: Effort normal.  Abdominal: Soft.  Musculoskeletal:       Right hip: She exhibits tenderness.       Legs: Neurological: She is alert and oriented to person, place, and time. She has normal reflexes. She displays normal reflexes. No cranial nerve deficit. She exhibits normal muscle tone. Coordination normal.  Skin: Skin is warm and dry.  Psychiatric: She has a normal mood and affect. Her behavior is normal. Judgment and thought content normal.    Her BMI is 46.0 and is noted here.  She is aware of this.      Assessment & Plan:   Encounter Diagnoses  Name Primary?  . Trochanteric bursitis of right hip Yes  . Chronic right-sided low back pain with right-sided sciatica   . Body mass index 45.0-49.9, adult ([place])   . Morbid obesity ([place])    PROCEDURE NOTE:  The patient request injection, verbal consent was obtained.  The right trochanteric area of the hip was prepped appropriately after time out was performed.   Sterile technique was observed and injection of 1 cc of Depo-Medrol 40 mg with several cc's of plain xylocaine. Anesthesia was provided by ethyl chloride and a 20-gauge needle was used to inject the hip area. The injection was tolerated well.  A band aid dressing was applied.  The patient was advised to apply  ice later today and tomorrow to the injection sight as needed.  I will see her in two weeks.    Call if any problem.  Precautions discussed.   Electronically Signed Sanjuana Kava, MD 7/11/20199:11 AM

## 2018-01-22 ENCOUNTER — Ambulatory Visit (INDEPENDENT_AMBULATORY_CARE_PROVIDER_SITE_OTHER): Payer: Medicare Other | Admitting: Orthopaedic Surgery

## 2018-01-22 ENCOUNTER — Encounter: Payer: Self-pay | Admitting: Orthopaedic Surgery

## 2018-01-22 VITALS — BP 138/80 | Temp 98.4°F | Ht 64.0 in | Wt 266.0 lb

## 2018-01-22 DIAGNOSIS — M7061 Trochanteric bursitis, right hip: Secondary | ICD-10-CM | POA: Diagnosis not present

## 2018-01-22 DIAGNOSIS — Z6841 Body Mass Index (BMI) 40.0 and over, adult: Secondary | ICD-10-CM | POA: Diagnosis not present

## 2018-01-22 NOTE — Progress Notes (Signed)
Patient Stephanie Jarvis, female DOB:Feb 11, 1951, 67 y.o. DTO:[id number]  Chief Complaint  Patient presents with  . Hip Pain    right     HPI  Stephanie Jarvis is a 67 y.o. female who has trochanteric bursitis of the right hip.  She is improved.  She has less pain but still has some pain.  She had stopped her Mobic and I told her to resume it.  She is to use ice to the area.   Body mass index is 45.66 kg/m.  ROS  Review of Systems  Constitutional: Positive for activity change.  Musculoskeletal: Positive for arthralgias, back pain and gait problem.  All other systems reviewed and are negative.   All other systems reviewed and are negative.  Past Medical History:  Diagnosis Date  . Ankle pain   . Anxiety   . Arthritis   . Breast cancer ([place]) 2008   ILC of Left breast; ER+  . 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 GERD (gastroesophageal reflux disease)   . H/O hiatal hernia 2003  . History of atrial fibrillation 2011  . History of blood transfusion   . History of hepatitis A    Age 40  . History of pneumonia   . Hyperlipidemia   . Knee pain   . Sciatica   . Vertigo     Past Surgical History:  Procedure Laterality Date  . BREAST LUMPECTOMY Left 1982,2008   x 2   . CHOLECYSTECTOMY N/A 12/31/2016   Procedure: LAPAROSCOPIC CHOLECYSTECTOMY WITH POSSIBLE INTRAOPERATIVE CHOLANGIOGRAM;  Surgeon: Rolm Bookbinder, MD;  Location: [facility];  Service: General;  Laterality: N/A;  . COLONOSCOPY    . HIATAL HERNIA REPAIR  2003  . Lymph node removal  2008  . MASTECTOMY MODIFIED RADICAL Left 01/26/2014   Procedure: LEFT MODIFIED RADICAL MASTECTOMY;  Surgeon: Jamesetta So, MD;  Location: [facility];  Service: General;  Laterality: Left;  . PORT-A-CATH REMOVAL  2009 ish  . PORTA CATH INSERTION  [id number]  . TOTAL KNEE ARTHROPLASTY Right 2006  . TOTAL KNEE ARTHROPLASTY  08/12/2011   Procedure: TOTAL KNEE ARTHROPLASTY;  Surgeon: Arther Abbott, MD;  Location: [facility];  Service: Orthopedics;  Laterality: Left;  . TUBAL LIGATION  1980    Family History  Problem Relation Age of Onset  . Stroke Father   . Heart disease Father   . Skin cancer Sister        dx. 15-58; unknown type  . Throat cancer Maternal Uncle 75       smoker; voicebox removed  . Alzheimer's disease Paternal Grandmother   . Heart attack Paternal Grandfather   . Alzheimer's disease Maternal Aunt   . Heart disease Maternal Uncle   . Lung cancer Cousin        smoker  . Lung cancer Paternal Aunt        dx. mid-70s; worked at [employer]  . Heart attack Daughter        caused by thyroid issues  . Heart disease Unknown   . Arthritis Unknown   . Lung disease Unknown   . Cancer Unknown   . Kidney disease Unknown     Social History Social History   Tobacco Use  . Smoking status: Former Smoker    Packs/day: 3.00    Years: 25.00    Pack years: 75.00    Types: Cigarettes    Last attempt to quit: 06/30/1998  Years since quitting: 19.5  . Smokeless tobacco: Never Used  Substance Use Topics  . Alcohol use: Yes    Alcohol/week: 0.0 oz    Comment: 2 glasses wine per month  . Drug use: No    Allergies  Allergen Reactions  . No Known Allergies     Current Outpatient Medications  Medication Sig Dispense Refill  . albuterol (PROVENTIL) (2.5 MG/3ML) 0.083% nebulizer solution Inhale 3 mLs into the lungs every 4 (four) hours as needed.    Stephanie Jarvis anastrozole (ARIMIDEX) 1 MG tablet Take 1 tablet (1 mg total) by mouth daily. 90 tablet 3  . aspirin EC 81 MG tablet Take 81 mg by mouth daily.    . baclofen (LIORESAL) 20 MG tablet Take 1 tablet by mouth at bedtime as needed.    Stephanie Jarvis buPROPion (WELLBUTRIN XL) 300 MG 24 hr tablet Take 300 mg by mouth daily.    . fexofenadine (ALLEGRA) 180 MG tablet Take 180 mg by mouth daily.    . furosemide (LASIX) 40 MG tablet Take 1 tablet (40 mg total) by mouth 2 (two) times daily. 180 tablet 3  . gabapentin (NEURONTIN)  600 MG tablet     . KLOR-CON M20 20 MEQ tablet TAKE 1 TABLET TWICE A DAY 180 tablet 3  . meloxicam (MOBIC) 7.5 MG tablet Take 15 mg by mouth daily.     . metoprolol tartrate (LOPRESSOR) 25 MG tablet Take 1 tablet (25 mg total) by mouth 2 (two) times daily. 180 tablet 3  . omeprazole (PRILOSEC) 20 MG capsule Take 20 mg by mouth 2 (two) times daily.     Stephanie Jarvis PROVENTIL HFA 108 (90 Base) MCG/ACT inhaler Inhale 2 puffs into the lungs every 6 (six) hours as needed for wheezing or shortness of breath.     . rosuvastatin (CRESTOR) 20 MG tablet Take 20 mg by mouth at bedtime.     . sertraline (ZOLOFT) 100 MG tablet Take 100 mg by mouth at bedtime.     No current facility-administered medications for this visit.      Physical Exam  Blood pressure 138/80, temperature 98.4 F (36.9 C), height 5' 4" (1.626 m), weight 266 lb (120.7 kg).  Constitutional: overall normal hygiene, normal nutrition, well developed, normal grooming, normal body habitus. Assistive device:none  Musculoskeletal: gait and station Limp none, muscle tone and strength are normal, no tremors or atrophy is present.  .  Neurological: coordination overall normal.  Deep tendon reflex/nerve stretch intact.  Sensation normal.  Cranial nerves II-XII intact.   Skin:   Normal overall no scars, lesions, ulcers or rashes. No psoriasis.  Psychiatric: Alert and oriented x 3.  Recent memory intact, remote memory unclear.  Normal mood and affect. Well groomed.  Good eye contact.  Cardiovascular: overall no swelling, no varicosities, no edema bilaterally, normal temperatures of the legs and arms, no clubbing, cyanosis and good capillary refill.  Lymphatic: palpation is normal.  Her right hip is tender over the trochanteric area but she has less tenderness than before. ROM of the hip is good.  She has no limp today.  All other systems reviewed and are negative   The patient has been educated about the nature of the problem(s) and counseled on  treatment options.  The patient appeared to understand what I have discussed and is in agreement with it.  Encounter Diagnoses  Name Primary?  . Trochanteric bursitis of right hip Yes  . Body mass index 45.0-49.9, adult ([place])   . Morbid obesity (  [place])     PLAN Call if any problems.  Precautions discussed.  Continue current medications.   Return to clinic 1 month   Electronically Signed Sanjuana Kava, MD 7/25/20199:19 AM

## 2018-02-18 ENCOUNTER — Encounter: Payer: Self-pay | Admitting: Orthopaedic Surgery

## 2018-02-18 ENCOUNTER — Ambulatory Visit (INDEPENDENT_AMBULATORY_CARE_PROVIDER_SITE_OTHER): Payer: Medicare Other | Admitting: Orthopaedic Surgery

## 2018-02-18 VITALS — BP 131/68 | HR 76 | Ht 66.0 in | Wt 266.8 lb

## 2018-02-18 DIAGNOSIS — M7061 Trochanteric bursitis, right hip: Secondary | ICD-10-CM

## 2018-02-18 DIAGNOSIS — Z6841 Body Mass Index (BMI) 40.0 and over, adult: Secondary | ICD-10-CM

## 2018-02-18 NOTE — Progress Notes (Signed)
PROCEDURE NOTE:  The patient request injection, verbal consent was obtained.  The right trochanteric area of the hip was prepped appropriately after time out was performed.   Sterile technique was observed and injection of 1 cc of Depo-Medrol 40 mg with several cc's of plain xylocaine. Anesthesia was provided by ethyl chloride and a 20-gauge needle was used to inject the hip area. The injection was tolerated well.  A band aid dressing was applied.  The patient was advised to apply ice later today and tomorrow to the injection sight as needed.  The patient meets the AMA guidelines for Morbid (severe) obesity with a BMI > 40.0 and I have recommended weight loss.  I will see as needed.  Call if any problem.  Precautions discussed.   Electronically Signed Sanjuana Kava, MD 8/21/20199:20 AM

## 2018-02-19 ENCOUNTER — Ambulatory Visit: Payer: Medicare Other | Admitting: Orthopaedic Surgery

## 2018-02-25 ENCOUNTER — Ambulatory Visit (INDEPENDENT_AMBULATORY_CARE_PROVIDER_SITE_OTHER): Payer: Medicare Other

## 2018-02-25 ENCOUNTER — Other Ambulatory Visit: Payer: Self-pay

## 2018-02-25 ENCOUNTER — Telehealth: Payer: Self-pay | Admitting: *Deleted

## 2018-02-25 DIAGNOSIS — I3139 Other pericardial effusion (noninflammatory): Secondary | ICD-10-CM

## 2018-02-25 DIAGNOSIS — I313 Pericardial effusion (noninflammatory): Secondary | ICD-10-CM

## 2018-02-25 NOTE — Telephone Encounter (Signed)
Patient informed and copy sent to PCP. 

## 2018-02-25 NOTE — Telephone Encounter (Signed)
-----   Message from Satira Sark, MD sent at 02/25/2018 12:53 PM EDT ----- Results reviewed.  Small to moderate pericardial effusion, somewhat smaller compared to previous study.  We will discuss further at office follow-up regarding any potential medication adjustments. A copy of this test should be forwarded to [name], MD.

## 2018-03-05 ENCOUNTER — Encounter: Payer: Self-pay | Admitting: *Deleted

## 2018-03-05 NOTE — Progress Notes (Signed)
Cardiology Office Note  Date: 03/06/2018   ID: Stephanie Jarvis, DOB Apr 01, 1951, MRN [id number]  PCP: [name], MD  Primary Cardiologist: Rozann Lesches, MD   Chief Complaint  Patient presents with  . Atrial Fibrillation  . Pericardial Effusion    History of Present Illness: Stephanie Jarvis is a 67 y.o. female last seen in May.  She is here for a follow-up visit.  She does not report any palpitations or progressive shortness of breath, no dizziness or syncope.  Follow-up echocardiogram in August showed LVEF 60 to 65%, small to moderate circumferential pericardial effusion with somewhat smaller dimensions compared to previous study in April.  No obvious hemodynamic significance.  We discussed the results today.  CHADSVASC score is 3.  We have discussed initiating anticoagulation.  I reviewed her baseline lab work.  Past Medical History:  Diagnosis Date  . Ankle pain   . Anxiety   . Arthritis   . Breast cancer ([facility]) 2008   ILC of Left breast; ER+  . 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 GERD (gastroesophageal reflux disease)   . H/O hiatal hernia 2003  . History of atrial fibrillation 2011  . History of blood transfusion   . History of hepatitis A    Age 40  . History of pneumonia   . Hyperlipidemia   . Knee pain   . Sciatica   . Vertigo     Past Surgical History:  Procedure Laterality Date  . BREAST LUMPECTOMY Left 1982,2008   x 2   . CHOLECYSTECTOMY N/A 12/31/2016   Procedure: LAPAROSCOPIC CHOLECYSTECTOMY WITH POSSIBLE INTRAOPERATIVE CHOLANGIOGRAM;  Surgeon: Rolm Bookbinder, MD;  Location: [facility];  Service: General;  Laterality: N/A;  . COLONOSCOPY    . HIATAL HERNIA REPAIR  2003  . Lymph node removal  2008  . MASTECTOMY MODIFIED RADICAL Left 01/26/2014   Procedure: LEFT MODIFIED RADICAL MASTECTOMY;  Surgeon: Jamesetta So, MD;  Location: [facility];  Service: General;  Laterality: Left;  . PORT-A-CATH REMOVAL   2009 ish  . PORTA CATH INSERTION  [id number]  . TOTAL KNEE ARTHROPLASTY Right 2006  . TOTAL KNEE ARTHROPLASTY  08/12/2011   Procedure: TOTAL KNEE ARTHROPLASTY;  Surgeon: Arther Abbott, MD;  Location: [facility];  Service: Orthopedics;  Laterality: Left;  . TUBAL LIGATION  1980    Current Outpatient Medications  Medication Sig Dispense Refill  . albuterol (PROVENTIL) (2.5 MG/3ML) 0.083% nebulizer solution Inhale 3 mLs into the lungs every 4 (four) hours as needed.    Marland Kitchen anastrozole (ARIMIDEX) 1 MG tablet Take 1 tablet (1 mg total) by mouth daily. 90 tablet 3  . baclofen (LIORESAL) 20 MG tablet Take 1 tablet by mouth at bedtime as needed.    Marland Kitchen buPROPion (WELLBUTRIN XL) 300 MG 24 hr tablet Take 300 mg by mouth daily.    . fexofenadine (ALLEGRA) 180 MG tablet Take 180 mg by mouth daily.    . furosemide (LASIX) 40 MG tablet Take 1 tablet (40 mg total) by mouth 2 (two) times daily. 180 tablet 3  . gabapentin (NEURONTIN) 600 MG tablet     . KLOR-CON M20 20 MEQ tablet TAKE 1 TABLET TWICE A DAY 180 tablet 3  . meloxicam (MOBIC) 7.5 MG tablet Take 15 mg by mouth daily.     . metoprolol tartrate (LOPRESSOR) 25 MG tablet Take 1 tablet (25 mg total) by mouth 2 (two) times daily. 180 tablet 3  .  omeprazole (PRILOSEC) 20 MG capsule Take 20 mg by mouth 2 (two) times daily.     Marland Kitchen PROVENTIL HFA 108 (90 Base) MCG/ACT inhaler Inhale 2 puffs into the lungs every 6 (six) hours as needed for wheezing or shortness of breath.     . rosuvastatin (CRESTOR) 20 MG tablet Take 20 mg by mouth at bedtime.     . sertraline (ZOLOFT) 100 MG tablet Take 100 mg by mouth at bedtime.    Marland Kitchen apixaban (ELIQUIS) 5 MG TABS tablet Take 1 tablet (5 mg total) by mouth 2 (two) times daily. Patient has voucher for 1st month free 60 tablet 4   No current facility-administered medications for this visit.    Allergies:  No known allergies   Social History: The patient  reports that she quit smoking about 19 years ago. Her smoking use included  cigarettes. She has a 75.00 pack-year smoking history. She has never used smokeless tobacco. She reports that she drinks alcohol. She reports that she does not use drugs.   ROS:  Please see the history of present illness. Otherwise, complete review of systems is positive for none.  All other systems are reviewed and negative.   Physical Exam: VS:  BP 132/78   Pulse 74   Ht 5' 6"  (1.676 m)   Wt 268 lb 6.4 oz (121.7 kg)   SpO2 95% Comment: on room air  BMI 43.32 kg/m , BMI Body mass index is 43.32 kg/m.  Wt Readings from Last 3 Encounters:  03/06/18 268 lb 6.4 oz (121.7 kg)  02/18/18 266 lb 12.8 oz (121 kg)  01/22/18 266 lb (120.7 kg)    General: Obese woman, appears comfortable at rest. HEENT: Conjunctiva and lids normal, oropharynx clear. Neck: Supple, no elevated JVP or carotid bruits, no thyromegaly. Lungs: Clear to auscultation, nonlabored breathing at rest. Cardiac: Regular rate and rhythm, no S3 or significant systolic murmur, no pericardial rub. Abdomen: Soft, nontender, bowel sounds present. Extremities: Trace ankle edema, distal pulses 2+. Skin: Warm and dry. Musculoskeletal: No kyphosis. Neuropsychiatric: Alert and oriented x3, affect grossly appropriate.  ECG: I personally reviewed the tracing from 11/19/2017 which showed normal sinus rhythm with sinus arrhythmia, borderline low voltage in the precordial leads and poor R wave progression.  Recent Labwork: 09/03/2017: ALT 24; AST 27; Hemoglobin 12.5; Platelets 220 11/25/2017: BUN 14; Creatinine, Ser 0.82; Potassium 4.0; Sodium 138   Other Studies Reviewed Today:  Echocardiogram 02/17/2018: Study Conclusions  - Limited echo to evaluate pericardial effusion. - Left ventricle: The cavity size was normal. Wall thickness was   increased in a pattern of mild LVH. Systolic function was normal.   The estimated ejection fraction was in the range of 60% to 65%.   Wall motion was normal; there were no regional wall motion    abnormalities. - Inferior vena cava: The vessel was normal in size. The   respirophasic diameter changes were in the normal range (= 50%),   consistent with normal central venous pressure. - Pericardium, extracardiac: There is a small to moderate   circumferential pericardial effusion measuring 1.3 cm in diastole   adjacent to the LV. There is no tamonade physiology by echo. - Stable, perhaps mildly decreased pericardial effusion compared to   prior study 10/28/2017.  Assessment and Plan:  1.  Pericardial effusion, small to moderate by recent follow-up echocardiogram with decrease in size overall.  She is asymptomatic.  She continues on Lasix and potassium supplements.  2.  Paroxysmal atrial fibrillation with CHADSVASC  score of 3.  She will continue beta-blocker and we will transition from aspirin to Eliquis 5 mg twice daily for stroke prophylaxis.  Follow-up CBC and BMET in 4 months.  Current medicines were reviewed with the patient today.   Orders Placed This Encounter  Procedures  . Basic metabolic panel  . CBC    Disposition: Follow-up in 4 months.  Signed, Satira Sark, MD, [facility] 03/06/2018 9:35 AM    [facility] [place], [place], [place] [address] Phone: [phone]; Fax: [phone]

## 2018-03-06 ENCOUNTER — Ambulatory Visit (INDEPENDENT_AMBULATORY_CARE_PROVIDER_SITE_OTHER): Payer: Medicare Other | Admitting: Cardiology

## 2018-03-06 ENCOUNTER — Encounter: Payer: Self-pay | Admitting: Cardiology

## 2018-03-06 VITALS — BP 132/78 | HR 74 | Ht 66.0 in | Wt 268.4 lb

## 2018-03-06 DIAGNOSIS — I313 Pericardial effusion (noninflammatory): Secondary | ICD-10-CM

## 2018-03-06 DIAGNOSIS — I3139 Other pericardial effusion (noninflammatory): Secondary | ICD-10-CM

## 2018-03-06 DIAGNOSIS — I480 Paroxysmal atrial fibrillation: Secondary | ICD-10-CM | POA: Diagnosis not present

## 2018-03-06 MED ORDER — APIXABAN 5 MG PO TABS: 5.0000 mg | ORAL_TABLET | Freq: Two times a day (BID) | ORAL | 4 refills | Status: DC

## 2018-03-06 NOTE — Patient Instructions (Addendum)
Medication Instructions:   Your physician has recommended you make the following change in your medication:   Stop aspirin  Start eliquis 5 mg by mouth twice daily.  Continue all other medications the same.  Labwork:  Your physician recommends that you return for lab work in: 4 months just before your next visit to check your BMET & CBC. Lab orders given to you during visit.  Testing/Procedures:  NONE  Follow-Up:  Your physician recommends that you schedule a follow-up appointment in: 4 months. You will receive a reminder letter in the mail in about 2 months reminding you to call and schedule your appointment. If you don't receive this letter, please contact our office.  Any Other Special Instructions Will Be Listed Below (If Applicable).  If you need a refill on your cardiac medications before your next appointment, please call your pharmacy.

## 2018-03-18 ENCOUNTER — Ambulatory Visit (HOSPITAL_COMMUNITY)
Admission: RE | Admit: 2018-03-18 | Discharge: 2018-03-18 | Disposition: A | Discharge status: Home / Self Care | Payer: Medicare Other | Source: Ambulatory Visit | Attending: Internal Medicine | Admitting: Internal Medicine

## 2018-03-18 ENCOUNTER — Inpatient Hospital Stay (HOSPITAL_COMMUNITY): Payer: Medicare Other | Attending: Hematology

## 2018-03-18 DIAGNOSIS — Z7901 Long term (current) use of anticoagulants: Secondary | ICD-10-CM | POA: Diagnosis not present

## 2018-03-18 DIAGNOSIS — Z79899 Other long term (current) drug therapy: Secondary | ICD-10-CM | POA: Diagnosis not present

## 2018-03-18 DIAGNOSIS — Z79811 Long term (current) use of aromatase inhibitors: Secondary | ICD-10-CM | POA: Insufficient documentation

## 2018-03-18 DIAGNOSIS — C50112 Malignant neoplasm of central portion of left female breast: Secondary | ICD-10-CM

## 2018-03-18 DIAGNOSIS — Z9221 Personal history of antineoplastic chemotherapy: Secondary | ICD-10-CM | POA: Diagnosis not present

## 2018-03-18 DIAGNOSIS — Z87891 Personal history of nicotine dependence: Secondary | ICD-10-CM | POA: Insufficient documentation

## 2018-03-18 DIAGNOSIS — Z853 Personal history of malignant neoplasm of breast: Secondary | ICD-10-CM | POA: Diagnosis not present

## 2018-03-18 DIAGNOSIS — Z170 Estrogen receptor positive status [ER+]: Secondary | ICD-10-CM | POA: Insufficient documentation

## 2018-03-18 DIAGNOSIS — I4891 Unspecified atrial fibrillation: Secondary | ICD-10-CM | POA: Insufficient documentation

## 2018-03-18 DIAGNOSIS — R5383 Other fatigue: Secondary | ICD-10-CM | POA: Diagnosis not present

## 2018-03-18 DIAGNOSIS — Z8249 Family history of ischemic heart disease and other diseases of the circulatory system: Secondary | ICD-10-CM | POA: Insufficient documentation

## 2018-03-18 DIAGNOSIS — Z780 Asymptomatic menopausal state: Secondary | ICD-10-CM | POA: Diagnosis not present

## 2018-03-18 DIAGNOSIS — Z923 Personal history of irradiation: Secondary | ICD-10-CM | POA: Diagnosis not present

## 2018-03-18 DIAGNOSIS — Z13820 Encounter for screening for osteoporosis: Secondary | ICD-10-CM | POA: Diagnosis not present

## 2018-03-18 LAB — CBC WITH DIFFERENTIAL/PLATELET
BASOS ABS: 0 10*3/uL (ref 0.0–0.1)
Basophils Relative: 1 %
EOS ABS: 0.2 10*3/uL (ref 0.0–0.7)
EOS PCT: 3 %
HEMATOCRIT: 36.4 % (ref 36.0–46.0)
Hemoglobin: 12 g/dL (ref 12.0–15.0)
Lymphocytes Relative: 30 %
Lymphs Abs: 1.9 10*3/uL (ref 0.7–4.0)
MCH: 31.3 pg (ref 26.0–34.0)
MCHC: 33 g/dL (ref 30.0–36.0)
MCV: 95 fL (ref 78.0–100.0)
MONO ABS: 0.5 10*3/uL (ref 0.1–1.0)
Monocytes Relative: 8 %
Neutro Abs: 3.6 10*3/uL (ref 1.7–7.7)
Neutrophils Relative %: 58 %
Platelets: 224 10*3/uL (ref 150–400)
RBC: 3.83 MIL/uL — ABNORMAL LOW (ref 3.87–5.11)
RDW: 13.6 % (ref 11.5–15.5)
WBC: 6.1 10*3/uL (ref 4.0–10.5)

## 2018-03-18 LAB — COMPREHENSIVE METABOLIC PANEL
ALT: 22 U/L (ref 0–44)
ANION GAP: 10 (ref 5–15)
AST: 23 U/L (ref 15–41)
Albumin: 3.6 g/dL (ref 3.5–5.0)
Alkaline Phosphatase: 70 U/L (ref 38–126)
BILIRUBIN TOTAL: 0.6 mg/dL (ref 0.3–1.2)
BUN: 14 mg/dL (ref 8–23)
CALCIUM: 8.9 mg/dL (ref 8.9–10.3)
CO2: 28 mmol/L (ref 22–32)
CREATININE: 0.82 mg/dL (ref 0.44–1.00)
Chloride: 101 mmol/L (ref 98–111)
GFR calc non Af Amer: >60 mL/min (ref >60)
GLUCOSE: 160 mg/dL — AB (ref 70–99)
Potassium: 3.7 mmol/L (ref 3.5–5.1)
Sodium: 139 mmol/L (ref 135–145)
TOTAL PROTEIN: 6.9 g/dL (ref 6.5–8.1)

## 2018-03-18 LAB — LACTATE DEHYDROGENASE: LDH: 145 U/L (ref 98–192)

## 2018-03-20 ENCOUNTER — Inpatient Hospital Stay (HOSPITAL_BASED_OUTPATIENT_CLINIC_OR_DEPARTMENT_OTHER): Payer: Medicare Other | Admitting: Internal Medicine

## 2018-03-20 ENCOUNTER — Encounter (HOSPITAL_COMMUNITY): Payer: Self-pay | Admitting: Internal Medicine

## 2018-03-20 VITALS — BP 120/71 | HR 76 | Temp 98.3°F | Resp 16 | Wt 271.4 lb

## 2018-03-20 DIAGNOSIS — Z87891 Personal history of nicotine dependence: Secondary | ICD-10-CM

## 2018-03-20 DIAGNOSIS — Z79811 Long term (current) use of aromatase inhibitors: Secondary | ICD-10-CM | POA: Diagnosis not present

## 2018-03-20 DIAGNOSIS — Z79899 Other long term (current) drug therapy: Secondary | ICD-10-CM

## 2018-03-20 DIAGNOSIS — I4891 Unspecified atrial fibrillation: Secondary | ICD-10-CM

## 2018-03-20 DIAGNOSIS — Z170 Estrogen receptor positive status [ER+]: Secondary | ICD-10-CM

## 2018-03-20 DIAGNOSIS — C50112 Malignant neoplasm of central portion of left female breast: Secondary | ICD-10-CM | POA: Diagnosis not present

## 2018-03-20 DIAGNOSIS — Z853 Personal history of malignant neoplasm of breast: Secondary | ICD-10-CM

## 2018-03-20 DIAGNOSIS — Z9221 Personal history of antineoplastic chemotherapy: Secondary | ICD-10-CM | POA: Diagnosis not present

## 2018-03-20 DIAGNOSIS — Z923 Personal history of irradiation: Secondary | ICD-10-CM

## 2018-03-20 DIAGNOSIS — R5383 Other fatigue: Secondary | ICD-10-CM | POA: Diagnosis not present

## 2018-03-20 DIAGNOSIS — Z7901 Long term (current) use of anticoagulants: Secondary | ICD-10-CM

## 2018-03-20 NOTE — Patient Instructions (Signed)
[facility] at [facility] Discharge Instructions  You saw Dr. Walden Field today. Please have for Mammogram in May and follow up with [place] afterwards.   Thank you for choosing [facility] to provide your oncology and hematology care.  To afford each patient quality time with our provider, please arrive at least 15 minutes before your scheduled appointment time.   If you have a lab appointment with the [facility] please come in thru the  Main Entrance and check in at the main information desk  You need to re-schedule your appointment should you arrive 10 or more minutes late.  We strive to give you quality time with our providers, and arriving late affects you and other patients whose appointments are after yours.  Also, if you no show three or more times for appointments you may be dismissed from the clinic at the providers discretion.     Again, thank you for choosing [facility].  Our hope is that these requests will decrease the amount of time that you wait before being seen by our physicians.       _____________________________________________________________  Should you have questions after your visit to [facility], please contact our office at [phone] between the hours of 8:00 a.m. and 4:30 p.m.  Voicemails left after 4:00 p.m. will not be returned until the following business day.  For prescription refill requests, have your pharmacy contact our office and allow 72 hours.    Cancer Center Support Programs:   > Cancer Support Group  2nd Tuesday of the month 1pm-2pm, Journey Room

## 2018-03-20 NOTE — Progress Notes (Signed)
Diagnosis Malignant neoplasm of central portion of left breast in female, estrogen receptor positive ([place]) - Plan: MM 3D SCREEN BREAST UNI RIGHT, CBC with Differential/Platelet, Comprehensive metabolic panel, Lactate dehydrogenase  Staging Cancer Staging Breast cancer [facility]) Staging form: Breast, AJCC 7th Edition - Clinical stage from 02/01/2014: Stage 0 (Tis (DCIS), N0, M0) - Signed by Baird Cancer, PA-C on 12/01/2015   Assessment and Plan:  1.  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 Intermediate Grade DCIS.    She had a remote history of stage I (T1c N0 M0) invasive lobular carcinoma of the left breast, status post lumpectomy, sentinel node biopsy, Oncotype DX score 35 with ER PR positive, HER-2/neu negative, treated with lumpectomy, 4 cycles of Taxotere/Cytoxan ending on 02/12/2007, radiotherapy to the left breast with anastrozole 1 mg daily from 01/2007 until 01/2012. (Treated in [place])  She was originally diagnosed with breast cancer in the left breast in 2008.  She was then diagnosed with a second breast cancer in the left breast in 2015. She has undergone a left mastectomy. She was started back on Arimidex in 2015 and continues to tolerate therapy.    Right screening mammogram done 11/10/2017 is negative.  She is set up for right screening mammogram in  May 2020.  She should continue Arimidex as directed.  She will RTC in 10/2018 for follow-up to go over labs.    2. Atrial fibrillation.  She is on Eliquis.  Follow with cardiology as directed.  3.  Fatigue.  Labs done 03/18/2018 reviewed and showed WBC 6.1, HB 12 plts 224,000.  Chemistries WNL with K+ 3.7 Cr 0.82 and normal LFTS.   I have discussed with her HB is WNL.  She will follow-up with PCP to discuss if thyroid levels have been checked.  Follow-up with cardiology as directed.    4.  Health maintenance.   Follow-up with GI as directed.  Bone density done 03/18/2018 was normal.  She should take calcium and vitamin D to maintain BMD while on AI.  She will have repeat BMD in 03/2020.   30 minutes spent with more than 50% spent in counseling and coordination of care.    Interval history: Historical data obtained from note dated 11/14/2017.   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 Intermediate Grade DCIS.    She had a remote history of stage I (T1c N0 M0) invasive lobular carcinoma of the left breast, status post lumpectomy, sentinel node biopsy, Oncotype DX score 35 with ER PR positive, HER-2/neu negative, treated with lumpectomy, 4 cycles of Taxotere/Cytoxan ending on 02/12/2007, radiotherapy to the left breast with anastrozole 1 mg daily from 01/2007 until 01/2012. (Treated in [place])  She was originally diagnosed with breast cancer in the left breast in 2008. She was treated with surgery, chemotherapy and radiation at that time. She states she took Arimidex for approximately 5 years.   She was then diagnosed with a second breast cancer in the left breast in 2015. She has undergone a left mastectomy. She was started back on Arimidex in 2015.   Current status: Patient is seen today for follow-up.  She is here to go over BMD.  She reports she is now on Eliquis due to atrial fibrillation and is followed by cardiology.  She remains on Arimidex.  Breast cancer ([place])   12/08/2013 Procedure    Left needle core biopsy    12/09/2013 Pathology Results    Invasive ductal carcinoma with DCIS, ER 100%, PR 31%, Ki-67 marker 4%.  Insufficient material for HER2 testing.    12/28/2013 Breast MRI    Post biopsy changes located within the left breast laterally (upper-outer quadrant) related to the patient's recent stereotactic biopsy. Also, postsurgical scarring changes within the left breast.  No areas of worrisome enhancement within the right breast.    01/26/2014 Definitive Surgery    Left modified radical mastectomy by Dr. Arnoldo Morale    02/01/2014 Pathology Results    Intermediate grade DCIS, 0.5 cm, 0/6 lymph nodes, negative resection margins.    03/13/2016 Imaging    Bone density- BMD as determined from Femur Neck Right is 1.023 g/cm2 with a T-Score of -0.1. This patient is considered normal according to [facility]) criteria.      Problem List Patient Active Problem List   Diagnosis Date Noted  . Breast cancer ([place]) [C50.919] 01/26/2014  . Metatarsal fracture [S92.309A] 10/19/2013  . PTTD (posterior tibial tendon dysfunction) [W11.914] 04/01/2012  . Pain in joint, lower leg [M25.569] 09/18/2011  . Stiffness of joint, lower leg [M25.669] 09/18/2011  . Difficulty in walking(719.7) [R26.2] 09/18/2011  . Swelling of joint of left knee [M25.462] 09/18/2011  . OA (osteoarthritis) of knee [M17.10] 08/01/2011    Past Medical History Past Medical History:  Diagnosis Date  . Ankle pain   . Anxiety   . Arthritis   . Breast cancer ([place]) 2008   ILC of Left breast; ER+  . Breast cancer ([place]) 2015   IDC+DCIS of Left breast; ER/PR+, Her2-, Ki67 = 4%  . GERD (gastroesophageal reflux disease)   . H/O hiatal hernia 2003  . History of atrial fibrillation 2011  . History of blood transfusion   . History of hepatitis A    Age 67  . History of pneumonia   . Hyperlipidemia   . Knee pain   . Sciatica   . Vertigo     Past Surgical History Past Surgical History:  Procedure Laterality Date  . BREAST LUMPECTOMY Left 1982,2008   x 2   . CHOLECYSTECTOMY N/A 12/31/2016   Procedure: LAPAROSCOPIC CHOLECYSTECTOMY WITH POSSIBLE INTRAOPERATIVE CHOLANGIOGRAM;  Surgeon: Rolm Bookbinder, MD;  Location: [place];  Service: General;  Laterality: N/A;  . COLONOSCOPY    . HIATAL HERNIA REPAIR  2003  . Lymph node removal  2008  . MASTECTOMY MODIFIED RADICAL Left 01/26/2014    Procedure: LEFT MODIFIED RADICAL MASTECTOMY;  Surgeon: Jamesetta So, MD;  Location: [facility];  Service: General;  Laterality: Left;  . PORT-A-CATH REMOVAL  2009 ish  . PORTA CATH INSERTION  [id number]  . TOTAL KNEE ARTHROPLASTY Right 2006  . TOTAL KNEE ARTHROPLASTY  08/12/2011   Procedure: TOTAL KNEE ARTHROPLASTY;  Surgeon: Arther Abbott, MD;  Location: [facility];  Service: Orthopedics;  Laterality: Left;  . TUBAL LIGATION  1980    Family History Family History  Problem Relation Age of Onset  . Stroke Father   . Heart disease Father   . Skin cancer Sister        dx. 1-58; unknown type  . Throat cancer Maternal Uncle 75       smoker; voicebox removed  . Alzheimer's disease Paternal Grandmother   . Heart attack Paternal Grandfather   . Alzheimer's disease Maternal Aunt   . Heart disease Maternal Uncle   .  Lung cancer Cousin        smoker  . Lung cancer Paternal Aunt        dx. mid-70s; worked at [employer]  . Heart attack Daughter        caused by thyroid issues  . Heart disease Unknown   . Arthritis Unknown   . Lung disease Unknown   . Cancer Unknown   . Kidney disease Unknown      Social History  reports that she quit smoking about 19 years ago. Her smoking use included cigarettes. She has a 75.00 pack-year smoking history. She has never used smokeless tobacco. She reports that she drinks alcohol. She reports that she does not use drugs.  Medications  Current Outpatient Medications:  .  albuterol (PROVENTIL) (2.5 MG/3ML) 0.083% nebulizer solution, Inhale 3 mLs into the lungs every 4 (four) hours as needed., Disp: , Rfl:  .  anastrozole (ARIMIDEX) 1 MG tablet, Take 1 tablet (1 mg total) by mouth daily., Disp: 90 tablet, Rfl: 3 .  apixaban (ELIQUIS) 5 MG TABS tablet, Take 1 tablet (5 mg total) by mouth 2 (two) times daily. Patient has voucher for 1st month free, Disp: 60 tablet, Rfl: 4 .  baclofen (LIORESAL) 20 MG tablet, Take 1 tablet by mouth at bedtime as needed.,  Disp: , Rfl:  .  buPROPion (WELLBUTRIN XL) 300 MG 24 hr tablet, Take 300 mg by mouth daily., Disp: , Rfl:  .  fexofenadine (ALLEGRA) 180 MG tablet, Take 180 mg by mouth daily., Disp: , Rfl:  .  furosemide (LASIX) 40 MG tablet, Take 1 tablet (40 mg total) by mouth 2 (two) times daily., Disp: 180 tablet, Rfl: 3 .  KLOR-CON M20 20 MEQ tablet, TAKE 1 TABLET TWICE A DAY, Disp: 180 tablet, Rfl: 3 .  meloxicam (MOBIC) 7.5 MG tablet, Take 15 mg by mouth daily. , Disp: , Rfl:  .  metoprolol tartrate (LOPRESSOR) 25 MG tablet, Take 1 tablet (25 mg total) by mouth 2 (two) times daily., Disp: 180 tablet, Rfl: 3 .  omeprazole (PRILOSEC) 20 MG capsule, Take 20 mg by mouth 2 (two) times daily. , Disp: , Rfl:  .  PROVENTIL HFA 108 (90 Base) MCG/ACT inhaler, Inhale 2 puffs into the lungs every 6 (six) hours as needed for wheezing or shortness of breath. , Disp: , Rfl:  .  rosuvastatin (CRESTOR) 20 MG tablet, Take 20 mg by mouth at bedtime. , Disp: , Rfl:  .  sertraline (ZOLOFT) 100 MG tablet, Take 100 mg by mouth at bedtime., Disp: , Rfl:   Allergies No known allergies  Review of Systems Review of Systems - Oncology ROS negative other than fatigue   Physical Exam  Vitals Wt Readings from Last 3 Encounters:  03/20/18 271 lb 6.4 oz (123.1 kg)  03/06/18 268 lb 6.4 oz (121.7 kg)  02/18/18 266 lb 12.8 oz (121 kg)   Temp Readings from Last 3 Encounters:  03/20/18 98.3 F (36.8 C) (Oral)  01/22/18 98.4 F (36.9 C)  01/08/18 (!) 97.4 F (36.3 C)   BP Readings from Last 3 Encounters:  03/20/18 120/71  03/06/18 132/78  02/18/18 131/68   Pulse Readings from Last 3 Encounters:  03/20/18 76  03/06/18 74  02/18/18 76   Constitutional: Well-developed, well-nourished, and in no distress.   HENT: Head: Normocephalic and atraumatic.  Mouth/Throat: No oropharyngeal exudate. Mucosa moist. Eyes: Pupils are equal, round, and reactive to light. Conjunctivae are normal. No scleral icterus.  Neck: Normal  range  of motion. Neck supple. No JVD present.  Cardiovascular: Normal rate, regular rhythm and normal heart sounds.  Exam reveals no gallop and no friction rub.   No murmur heard. Pulmonary/Chest: Effort normal and breath sounds normal. No respiratory distress. No wheezes.No rales.  Abdominal: Soft. Bowel sounds are normal. No distension. There is no tenderness. There is no guarding.  Musculoskeletal: No edema or tenderness.  Lymphadenopathy: No cervical, axillary or supraclavicular adenopathy.  Neurological: Alert and oriented to person, place, and time. No cranial nerve deficit.  Skin: Skin is warm and dry. No rash noted. No erythema. No pallor.  Psychiatric: Affect and judgment normal.  Breast Exam:  Chaperone present.  Left mastectomy site healed well. No skin changes noted or palpable nodules.  Right breast shows no dominant masses.    Labs Appointment on 03/18/2018  Component Date Value Ref Range Status  . WBC 03/18/2018 6.1  4.0 - 10.5 K/uL Final  . RBC 03/18/2018 3.83* 3.87 - 5.11 MIL/uL Final  . Hemoglobin 03/18/2018 12.0  12.0 - 15.0 g/dL Final  . HCT 03/18/2018 36.4  36.0 - 46.0 % Final  . MCV 03/18/2018 95.0  78.0 - 100.0 fL Final  . MCH 03/18/2018 31.3  26.0 - 34.0 pg Final  . MCHC 03/18/2018 33.0  30.0 - 36.0 g/dL Final  . RDW 03/18/2018 13.6  11.5 - 15.5 % Final  . Platelets 03/18/2018 224  150 - 400 K/uL Final  . Neutrophils Relative % 03/18/2018 58  % Final  . Neutro Abs 03/18/2018 3.6  1.7 - 7.7 K/uL Final  . Lymphocytes Relative 03/18/2018 30  % Final  . Lymphs Abs 03/18/2018 1.9  0.7 - 4.0 K/uL Final  . Monocytes Relative 03/18/2018 8  % Final  . Monocytes Absolute 03/18/2018 0.5  0.1 - 1.0 K/uL Final  . Eosinophils Relative 03/18/2018 3  % Final  . Eosinophils Absolute 03/18/2018 0.2  0.0 - 0.7 K/uL Final  . Basophils Relative 03/18/2018 1  % Final  . Basophils Absolute 03/18/2018 0.0  0.0 - 0.1 K/uL Final   Performed at [facility], [address]  [place], [place] [address]  . Sodium 03/18/2018 139  135 - 145 mmol/L Final  . Potassium 03/18/2018 3.7  3.5 - 5.1 mmol/L Final  . Chloride 03/18/2018 101  98 - 111 mmol/L Final  . CO2 03/18/2018 28  22 - 32 mmol/L Final  . Glucose, Bld 03/18/2018 160* 70 - 99 mg/dL Final  . BUN 03/18/2018 14  8 - 23 mg/dL Final  . Creatinine, Ser 03/18/2018 0.82  0.44 - 1.00 mg/dL Final  . Calcium 03/18/2018 8.9  8.9 - 10.3 mg/dL Final  . Total Protein 03/18/2018 6.9  6.5 - 8.1 g/dL Final  . Albumin 03/18/2018 3.6  3.5 - 5.0 g/dL Final  . AST 03/18/2018 23  15 - 41 U/L Final  . ALT 03/18/2018 22  0 - 44 U/L Final  . Alkaline Phosphatase 03/18/2018 70  38 - 126 U/L Final  . Total Bilirubin 03/18/2018 0.6  0.3 - 1.2 mg/dL Final  . GFR calc non Af Amer 03/18/2018 >60  >60 mL/min Final  . GFR calc Af Amer 03/18/2018 >60  >60 mL/min Final   Comment: (NOTE) The eGFR has been calculated using the CKD EPI equation. This calculation has not been validated in all clinical situations. eGFR's persistently <60 mL/min signify possible Chronic Kidney Disease.   . Anion gap 03/18/2018 10  5 - 15 Final   Performed at [facility], [address] [place], [place] [address]  . LDH 03/18/2018 145  98 - 192 U/L Final   Performed at [facility], [address] [place], [place] [address]     Pathology Orders Placed This Encounter  Procedures  . MM 3D SCREEN BREAST UNI RIGHT    Standing Status:   Future    Standing Expiration Date:   05/21/2019    Order Specific Question:   Reason for Exam (SYMPTOM  OR DIAGNOSIS REQUIRED)    Answer:   left breast cancer    Order Specific Question:   Preferred imaging location?    Answer:   [facility]  . CBC with Differential/Platelet    Standing Status:   Future    Standing Expiration Date:   03/20/2020  . Comprehensive metabolic panel    Standing Status:   Future    Standing Expiration Date:   03/20/2020  . Lactate dehydrogenase    Standing Status:   Future    Standing  Expiration Date:   03/20/2020       Zoila Shutter MD

## 2018-03-23 ENCOUNTER — Telehealth: Payer: Self-pay | Admitting: Cardiology

## 2018-03-23 NOTE — Telephone Encounter (Signed)
Agree, would continue to monitor.  Could have been related to focal irritation, but need to make sure that she does not have recurring hematuria that would need further work-up.

## 2018-03-23 NOTE — Telephone Encounter (Signed)
Pt says she notice blood in urine this morning - says she noticed it when she wiped - hasn't had any blood in urine/stool after several more times using the bathroom or blood anywhere else - advised pt to continue to monitor and to call us back if this continues to happen - will forward to provider if anything further recommended

## 2018-03-23 NOTE — Telephone Encounter (Signed)
LM to return call.

## 2018-03-23 NOTE — Telephone Encounter (Signed)
Patient noticed this morning that she has blood in her urine.   Concerned it is from blood thinner she was put on

## 2018-03-23 NOTE — Telephone Encounter (Signed)
LM to return call back.

## 2018-03-24 NOTE — Telephone Encounter (Signed)
LM to f/u and return call

## 2018-03-31 DIAGNOSIS — R739 Hyperglycemia, unspecified: Secondary | ICD-10-CM | POA: Diagnosis not present

## 2018-03-31 DIAGNOSIS — I480 Paroxysmal atrial fibrillation: Secondary | ICD-10-CM | POA: Diagnosis not present

## 2018-03-31 DIAGNOSIS — E669 Obesity, unspecified: Secondary | ICD-10-CM | POA: Diagnosis not present

## 2018-03-31 DIAGNOSIS — I313 Pericardial effusion (noninflammatory): Secondary | ICD-10-CM | POA: Diagnosis not present

## 2018-03-31 DIAGNOSIS — Z0001 Encounter for general adult medical examination with abnormal findings: Secondary | ICD-10-CM | POA: Diagnosis not present

## 2018-03-31 DIAGNOSIS — E7800 Pure hypercholesterolemia, unspecified: Secondary | ICD-10-CM | POA: Diagnosis not present

## 2018-03-31 DIAGNOSIS — J4520 Mild intermittent asthma, uncomplicated: Secondary | ICD-10-CM | POA: Diagnosis not present

## 2018-03-31 DIAGNOSIS — Z23 Encounter for immunization: Secondary | ICD-10-CM | POA: Diagnosis not present

## 2018-04-01 ENCOUNTER — Other Ambulatory Visit: Payer: Self-pay | Admitting: *Deleted

## 2018-04-01 MED ORDER — APIXABAN 5 MG PO TABS: 5.0000 mg | ORAL_TABLET | Freq: Two times a day (BID) | ORAL | 2 refills | Status: DC

## 2018-04-14 ENCOUNTER — Other Ambulatory Visit: Payer: Self-pay | Admitting: *Deleted

## 2018-04-14 MED ORDER — APIXABAN 5 MG PO TABS: 5.0000 mg | ORAL_TABLET | Freq: Two times a day (BID) | ORAL | 3 refills | Status: DC

## 2018-04-27 ENCOUNTER — Other Ambulatory Visit: Payer: Self-pay | Admitting: Cardiology

## 2018-04-27 MED ORDER — APIXABAN 5 MG PO TABS: 5.0000 mg | ORAL_TABLET | Freq: Two times a day (BID) | ORAL | 3 refills | Status: DC

## 2018-04-27 NOTE — Telephone Encounter (Signed)
° °  1. Which medications need to be refilled? (please list name of each medication and dose if known) Eliquis 5mg   2. Which pharmacy/location (including street and city if local pharmacy) is medication to be sent to? [facility]  3. Do they need a 30 day or 90 day supply? 90 day supply.

## 2018-04-27 NOTE — Telephone Encounter (Signed)
[facility] pt request

## 2018-06-05 ENCOUNTER — Telehealth: Payer: Self-pay | Admitting: Neurology

## 2018-06-05 DIAGNOSIS — G4733 Obstructive sleep apnea (adult) (pediatric): Secondary | ICD-10-CM

## 2018-06-05 DIAGNOSIS — Z9989 Dependence on other enabling machines and devices: Principal | ICD-10-CM

## 2018-06-05 NOTE — Telephone Encounter (Signed)
LMTC./fim 

## 2018-06-05 NOTE — Telephone Encounter (Signed)
Pt requesting a call stating she would like to discuss increasing the air flow. Stating she is snoring again also not sleeping as well.

## 2018-06-08 ENCOUNTER — Encounter: Payer: Self-pay | Admitting: Neurology

## 2018-06-08 NOTE — Telephone Encounter (Signed)
I called pt again to discuss. No answer, left a message asking her to call me back. 

## 2018-06-09 NOTE — Telephone Encounter (Signed)
Pt returned my call. She said that her cpap is going well other than her snoring has increased and she feels that the 10 cm H2O is not enough. Pt is getting regular supplies from [facility]. Pt wants to know if her pressure can be increased.

## 2018-06-09 NOTE — Telephone Encounter (Signed)
I called pt again to discuss. This is our third attempt at reaching pt by phone. I will send pt a letter asking her to call us back.

## 2018-06-09 NOTE — Telephone Encounter (Signed)
I called pt to discuss. No answer, left a message asking her to call me back.  I have sent the order to [facility]. Received a receipt of confirmation.

## 2018-06-09 NOTE — Telephone Encounter (Signed)
I reviewed her CPAP compliance data for the past 30 days, during which time she was fully compliant with an average usage of 8 hours and 56 minutes, residual AHI is at goal at 2.4 per hour and leak on the low side on a pressure of 10 cm. Nevertheless, if she has bothersome snoring, I can increase her CPAP pressure for residual snoring to 11 cm. Please fax to her DME company and notify patient. FU routinely as scheduled for June 2020 with MM.

## 2018-06-09 NOTE — Addendum Note (Signed)
Addended by: Star Age on: 06/09/2018 12:03 PM   Modules accepted: Orders

## 2018-06-11 NOTE — Telephone Encounter (Signed)
I called pt again, left a detailed message on her cell phone number per DPR, advising her that the cpap pressure was changed to 11 cm H2O. I asked her to call me back if she had any questions or concerns.

## 2018-06-22 DIAGNOSIS — J45901 Unspecified asthma with (acute) exacerbation: Secondary | ICD-10-CM | POA: Diagnosis not present

## 2018-06-23 NOTE — Progress Notes (Signed)
This encounter was created in error - please disregard.

## 2018-07-02 ENCOUNTER — Other Ambulatory Visit (HOSPITAL_COMMUNITY)
Admission: RE | Admit: 2018-07-02 | Discharge: 2018-07-02 | Disposition: A | Discharge status: Home / Self Care | Payer: Medicare Other | Source: Ambulatory Visit | Attending: Cardiology | Admitting: Cardiology

## 2018-07-02 DIAGNOSIS — I480 Paroxysmal atrial fibrillation: Secondary | ICD-10-CM | POA: Diagnosis not present

## 2018-07-02 LAB — BASIC METABOLIC PANEL
Anion gap: 8 (ref 5–15)
BUN: 22 mg/dL (ref 8–23)
CALCIUM: 8.6 mg/dL — AB (ref 8.9–10.3)
CO2: 27 mmol/L (ref 22–32)
CREATININE: 0.92 mg/dL (ref 0.44–1.00)
Chloride: 105 mmol/L (ref 98–111)
GFR calc Af Amer: >60 mL/min (ref >60)
GLUCOSE: 173 mg/dL — AB (ref 70–99)
Potassium: 3.3 mmol/L — ABNORMAL LOW (ref 3.5–5.1)
SODIUM: 140 mmol/L (ref 135–145)

## 2018-07-02 LAB — CBC
HCT: 39.5 % (ref 36.0–46.0)
Hemoglobin: 12.8 g/dL (ref 12.0–15.0)
MCH: 30.8 pg (ref 26.0–34.0)
MCHC: 32.4 g/dL (ref 30.0–36.0)
MCV: 95.2 fL (ref 80.0–100.0)
Platelets: 331 10*3/uL (ref 150–400)
RBC: 4.15 MIL/uL (ref 3.87–5.11)
RDW: 13.6 % (ref 11.5–15.5)
WBC: 10.2 10*3/uL (ref 4.0–10.5)
nRBC: 0 % (ref 0.0–0.2)

## 2018-07-03 ENCOUNTER — Encounter: Payer: Self-pay | Admitting: *Deleted

## 2018-07-05 NOTE — Progress Notes (Signed)
Cardiology Office Note  Date: 07/06/2018   ID: Stephanie Jarvis, DOB 01-Nov-1950, MRN [id number]  PCP: [name], MD  Primary Cardiologist: Rozann Lesches, MD   Chief Complaint  Patient presents with  . Atrial Fibrillation    History of Present Illness: Stephanie Jarvis is a 68 y.o. female last seen in September 2019.  She is here for a routine follow-up visit.  Reports no chest pain or palpitations, no dizziness or syncope.  She was started on Eliquis at the last visit.  She has tolerated this well, denies any bleeding problems, no changes in stool.  Recent lab work is reviewed below. Hemoglobin and renal function were normal. Potassium supplements were increased due to hypokalemia with follow-up lab work pending later this month.  Echocardiogram from August 2019 is outlined below.  Past Medical History:  Diagnosis Date  . Ankle pain   . Anxiety   . Arthritis   . Breast cancer ([place]) 2008   ILC of Left breast; ER+  . Breast cancer ([place]) 2015   IDC+DCIS of Left breast; ER/PR+, Her2-, Ki67 = 4%  . GERD (gastroesophageal reflux disease)   . H/O hiatal hernia 2003  . History of atrial fibrillation 2011  . History of blood transfusion   . History of hepatitis A    Age 31  . History of pneumonia   . Hyperlipidemia   . Knee pain   . Sciatica   . Vertigo     Past Surgical History:  Procedure Laterality Date  . BREAST LUMPECTOMY Left 1982,2008   x 2   . CHOLECYSTECTOMY N/A 12/31/2016   Procedure: LAPAROSCOPIC CHOLECYSTECTOMY WITH POSSIBLE INTRAOPERATIVE CHOLANGIOGRAM;  Surgeon: Rolm Bookbinder, MD;  Location: [place];  Service: General;  Laterality: N/A;  . COLONOSCOPY    . HIATAL HERNIA REPAIR  2003  . Lymph node removal  2008  . MASTECTOMY MODIFIED RADICAL Left 01/26/2014   Procedure: LEFT MODIFIED RADICAL MASTECTOMY;  Surgeon: Jamesetta So, MD;  [facility];  Service: General;  Laterality: Left;  . PORT-A-CATH REMOVAL  2009 ish  . PORTA CATH INSERTION   [id number]  . TOTAL KNEE ARTHROPLASTY Right 2006  . TOTAL KNEE ARTHROPLASTY  08/12/2011   Procedure: TOTAL KNEE ARTHROPLASTY;  Surgeon: Arther Abbott, MD;  [facility];  Service: Orthopedics;  Laterality: Left;  . TUBAL LIGATION  1980    Current Outpatient Medications  Medication Sig Dispense Refill  . albuterol (PROVENTIL) (2.5 MG/3ML) 0.083% nebulizer solution Inhale 3 mLs into the lungs every 4 (four) hours as needed.    Marland Kitchen anastrozole (ARIMIDEX) 1 MG tablet Take 1 tablet (1 mg total) by mouth daily. 90 tablet 3  . apixaban (ELIQUIS) 5 MG TABS tablet Take 1 tablet (5 mg total) by mouth 2 (two) times daily. 180 tablet 3  . baclofen (LIORESAL) 20 MG tablet Take 1 tablet by mouth at bedtime as needed.    Marland Kitchen buPROPion (WELLBUTRIN XL) 300 MG 24 hr tablet Take 300 mg by mouth daily.    . fexofenadine (ALLEGRA) 180 MG tablet Take 180 mg by mouth daily.    . furosemide (LASIX) 40 MG tablet Take 1 tablet (40 mg total) by mouth 2 (two) times daily. 180 tablet 3  . KLOR-CON M20 20 MEQ tablet TAKE 1 TABLET TWICE A DAY 180 tablet 3  . meloxicam (MOBIC) 7.5 MG tablet Take 15 mg by mouth daily.     . metoprolol tartrate (LOPRESSOR) 25 MG tablet Take 1 tablet (25  mg total) by mouth 2 (two) times daily. 180 tablet 3  . omeprazole (PRILOSEC) 20 MG capsule Take 20 mg by mouth 2 (two) times daily.     Marland Kitchen PROVENTIL HFA 108 (90 Base) MCG/ACT inhaler Inhale 2 puffs into the lungs every 6 (six) hours as needed for wheezing or shortness of breath.     . rosuvastatin (CRESTOR) 20 MG tablet Take 20 mg by mouth at bedtime.     . sertraline (ZOLOFT) 100 MG tablet Take 100 mg by mouth at bedtime.     No current facility-administered medications for this visit.    Allergies:  No known allergies   Social History: The patient  reports that she quit smoking about 20 years ago. Her smoking use included cigarettes. She has a 75.00 pack-year smoking history. She has never used smokeless tobacco. She reports current  alcohol use. She reports that she does not use drugs.   ROS:  Please see the history of present illness. Otherwise, complete review of systems is positive for none.  All other systems are reviewed and negative.   Physical Exam: VS:  BP 102/68   Pulse 83   Ht 5' 6"  (1.676 m)   Wt 268 lb (121.6 kg)   SpO2 98%   BMI 43.26 kg/m , BMI Body mass index is 43.26 kg/m.  Wt Readings from Last 3 Encounters:  07/06/18 268 lb (121.6 kg)  03/20/18 271 lb 6.4 oz (123.1 kg)  03/06/18 268 lb 6.4 oz (121.7 kg)    General: Obese woman, appears comfortable at rest. HEENT: Conjunctiva and lids normal, oropharynx clear. Neck: Supple, no elevated JVP or carotid bruits, no thyromegaly. Lungs: Clear to auscultation, nonlabored breathing at rest. Cardiac: Regular rate and rhythm, no S3 or significant systolic murmur. Abdomen: Soft, nontender, bowel sounds present. Extremities: Trace ankle edema, distal pulses 2+. Skin: Warm and dry. Musculoskeletal: No kyphosis. Neuropsychiatric: Alert and oriented x3, affect grossly appropriate.  ECG: I personally reviewed the tracing from 11/19/2017 which showed normal sinus rhythm with sinus arrhythmia, borderline low voltage in the precordial leads and poor R wave progression.  Recent Labwork: 03/18/2018: ALT 22; AST 23 07/02/2018: BUN 22; Creatinine, Ser 0.92; Hemoglobin 12.8; Platelets 331; Potassium 3.3; Sodium 140   Other Studies Reviewed Today:  Echocardiogram 02/17/2018: Study Conclusions  - Limited echo to evaluate pericardial effusion. - Left ventricle: The cavity size was normal. Wall thickness was increased in a pattern of mild LVH. Systolic function was normal. The estimated ejection fraction was in the range of 60% to 65%. Wall motion was normal; there were no regional wall motion abnormalities. - Inferior vena cava: The vessel was normal in size. The respirophasic diameter changes were in the normal range (= 50%), consistent with  normal central venous pressure. - Pericardium, extracardiac: There is a small to moderate circumferential pericardial effusion measuring 1.3 cm in diastole adjacent to the LV. There is no tamonade physiology by echo. - Stable, perhaps mildly decreased pericardial effusion compared to prior study 10/28/2017.  Assessment and Plan:  1.  Paroxysmal atrial fibrillation with CHADSVASC score of 3.  She is doing well on Eliquis, no obvious bleeding problems.  Follow-up CBC and BMET for her next visit.  2.  Pericardial effusion, asymptomatic and small to moderate by last imaging study in August 2019.  3.  Mixed hyperlipidemia, on Crestor with follow-up per PCP.  Current medicines were reviewed with the patient today.  Disposition: Follow-up CBC and BMET for her next visit.  Signed, Mikeal Hawthorne  Delman Kitten, MD, [facility] 07/06/2018 9:[address] [place] [facility] [place], [place], [place] [address] Phone: [phone]; Fax: [phone]

## 2018-07-06 ENCOUNTER — Encounter: Payer: Self-pay | Admitting: Cardiology

## 2018-07-06 ENCOUNTER — Ambulatory Visit (INDEPENDENT_AMBULATORY_CARE_PROVIDER_SITE_OTHER): Payer: Medicare Other | Admitting: Cardiology

## 2018-07-06 VITALS — BP 102/68 | HR 83 | Ht 66.0 in | Wt 268.0 lb

## 2018-07-06 DIAGNOSIS — E782 Mixed hyperlipidemia: Secondary | ICD-10-CM | POA: Diagnosis not present

## 2018-07-06 DIAGNOSIS — I3139 Other pericardial effusion (noninflammatory): Secondary | ICD-10-CM

## 2018-07-06 DIAGNOSIS — I313 Pericardial effusion (noninflammatory): Secondary | ICD-10-CM

## 2018-07-06 DIAGNOSIS — I480 Paroxysmal atrial fibrillation: Secondary | ICD-10-CM | POA: Diagnosis not present

## 2018-07-06 NOTE — Patient Instructions (Signed)
Medication Instructions:  Continue all current medications.  Labwork: CBC, BMET - due just prior to next office visit.   Testing/Procedures: none  Follow-Up: Your physician wants you to follow up in: 6 months.  You will receive a reminder letter in the mail one-two months in advance.  If you don't receive a letter, please call our office to schedule the follow up appointment   Any Other Special Instructions Will Be Listed Below (If Applicable).  If you need a refill on your cardiac medications before your next appointment, please call your pharmacy.

## 2018-07-09 ENCOUNTER — Encounter: Payer: Self-pay | Admitting: *Deleted

## 2018-07-23 ENCOUNTER — Telehealth: Payer: Self-pay | Admitting: *Deleted

## 2018-07-23 ENCOUNTER — Other Ambulatory Visit: Payer: Self-pay | Admitting: *Deleted

## 2018-07-23 DIAGNOSIS — I10 Essential (primary) hypertension: Secondary | ICD-10-CM

## 2018-07-23 MED ORDER — POTASSIUM CHLORIDE CRYS ER 20 MEQ PO TBCR: 40.0000 meq | EXTENDED_RELEASE_TABLET | ORAL | 3 refills | Status: DC

## 2018-07-23 NOTE — Telephone Encounter (Signed)
-----   Message from Erma Heritage, [place] sent at 07/02/2018  3:10 PM EST ----- Covering for Dr. Domenic Polite - Please let the patient know that her hemoglobin and platelets remain within a normal range. Kidney function within normal limits. Glucose was elevated to 173 which would be expected if not a fasting sample. K+ is slightly low at 3.3.  Please verify she is taking K-Dur 20 mEq twice daily as outlined at the time of her last office visit. If taking at this dose, would increase to 40 mEq in AM/20 mEq in PM with a repeat BMET in 2-3 weeks for reassessment. Please forward to [name], MD. Thank you.

## 2018-07-23 NOTE — Telephone Encounter (Signed)
Patient informed and verbalized understanding of plan. Patient confirmed that she has been taking potassium 20 meq BID. Patient says she did not need a new prescription sent to pharmacy because she had plenty. Patient verified that her number in her chart is correct. Copy sent to PCP. Lab order faxed to [facility].

## 2018-08-21 ENCOUNTER — Encounter: Payer: Self-pay | Admitting: *Deleted

## 2018-08-31 ENCOUNTER — Other Ambulatory Visit (HOSPITAL_COMMUNITY)
Admission: RE | Admit: 2018-08-31 | Discharge: 2018-08-31 | Disposition: A | Discharge status: Home / Self Care | Payer: Medicare Other | Source: Other Acute Inpatient Hospital | Attending: Cardiology | Admitting: Cardiology

## 2018-08-31 DIAGNOSIS — I10 Essential (primary) hypertension: Secondary | ICD-10-CM | POA: Insufficient documentation

## 2018-08-31 LAB — BASIC METABOLIC PANEL
Anion gap: 9 (ref 5–15)
BUN: 13 mg/dL (ref 8–23)
CALCIUM: 9.1 mg/dL (ref 8.9–10.3)
CO2: 28 mmol/L (ref 22–32)
Chloride: 102 mmol/L (ref 98–111)
Creatinine, Ser: 0.81 mg/dL (ref 0.44–1.00)
GFR calc Af Amer: >60 mL/min (ref >60)
GFR calc non Af Amer: >60 mL/min (ref >60)
Glucose, Bld: 146 mg/dL — ABNORMAL HIGH (ref 70–99)
Potassium: 3.6 mmol/L (ref 3.5–5.1)
Sodium: 139 mmol/L (ref 135–145)

## 2018-09-01 ENCOUNTER — Telehealth: Payer: Self-pay | Admitting: *Deleted

## 2018-09-01 ENCOUNTER — Encounter: Payer: Self-pay | Admitting: *Deleted

## 2018-09-01 NOTE — Telephone Encounter (Signed)
-----   Message from Satira Sark, MD sent at 08/31/2018 10:39 AM EST ----- Results reviewed.  Potassium is increased to 3.6 following change in KCl supplementation.  Continue same for now. A copy of this test should be forwarded to [name], MD.

## 2018-09-01 NOTE — Telephone Encounter (Signed)
Results mailed to patient. Copy sent to PCP

## 2018-09-22 DIAGNOSIS — I480 Paroxysmal atrial fibrillation: Secondary | ICD-10-CM | POA: Diagnosis not present

## 2018-09-22 DIAGNOSIS — K219 Gastro-esophageal reflux disease without esophagitis: Secondary | ICD-10-CM | POA: Diagnosis not present

## 2018-09-22 DIAGNOSIS — J4520 Mild intermittent asthma, uncomplicated: Secondary | ICD-10-CM | POA: Diagnosis not present

## 2018-09-23 ENCOUNTER — Other Ambulatory Visit: Payer: Self-pay | Admitting: *Deleted

## 2018-09-23 MED ORDER — FUROSEMIDE 40 MG PO TABS: 40.0000 mg | ORAL_TABLET | Freq: Two times a day (BID) | ORAL | 3 refills | Status: DC

## 2018-09-23 MED ORDER — METOPROLOL TARTRATE 25 MG PO TABS: 25.0000 mg | ORAL_TABLET | Freq: Two times a day (BID) | ORAL | 3 refills | Status: DC

## 2018-11-13 ENCOUNTER — Other Ambulatory Visit (HOSPITAL_COMMUNITY): Payer: Self-pay | Admitting: *Deleted

## 2018-11-13 DIAGNOSIS — C50111 Malignant neoplasm of central portion of right female breast: Secondary | ICD-10-CM

## 2018-11-13 DIAGNOSIS — C50112 Malignant neoplasm of central portion of left female breast: Secondary | ICD-10-CM

## 2018-11-15 ENCOUNTER — Other Ambulatory Visit (HOSPITAL_COMMUNITY): Payer: Self-pay | Admitting: Internal Medicine

## 2018-11-16 ENCOUNTER — Encounter (HOSPITAL_COMMUNITY): Payer: Self-pay

## 2018-11-16 ENCOUNTER — Inpatient Hospital Stay (HOSPITAL_COMMUNITY): Payer: Medicare Other | Attending: Hematology

## 2018-11-16 ENCOUNTER — Ambulatory Visit (HOSPITAL_COMMUNITY)
Admission: RE | Admit: 2018-11-16 | Discharge: 2018-11-16 | Disposition: A | Discharge status: Home / Self Care | Payer: Medicare Other | Source: Ambulatory Visit | Attending: Internal Medicine | Admitting: Internal Medicine

## 2018-11-16 ENCOUNTER — Other Ambulatory Visit: Payer: Self-pay

## 2018-11-16 DIAGNOSIS — C50112 Malignant neoplasm of central portion of left female breast: Secondary | ICD-10-CM

## 2018-11-16 DIAGNOSIS — Z170 Estrogen receptor positive status [ER+]: Secondary | ICD-10-CM

## 2018-11-16 DIAGNOSIS — Z1231 Encounter for screening mammogram for malignant neoplasm of breast: Secondary | ICD-10-CM | POA: Insufficient documentation

## 2018-11-16 DIAGNOSIS — C50111 Malignant neoplasm of central portion of right female breast: Secondary | ICD-10-CM

## 2018-11-16 LAB — COMPREHENSIVE METABOLIC PANEL
ALT: 27 U/L (ref 0–44)
AST: 25 U/L (ref 15–41)
Albumin: 3.7 g/dL (ref 3.5–5.0)
Alkaline Phosphatase: 72 U/L (ref 38–126)
Anion gap: 12 (ref 5–15)
BUN: 11 mg/dL (ref 8–23)
CO2: 27 mmol/L (ref 22–32)
Calcium: 9.5 mg/dL (ref 8.9–10.3)
Chloride: 100 mmol/L (ref 98–111)
Creatinine, Ser: 0.71 mg/dL (ref 0.44–1.00)
GFR calc Af Amer: >60 mL/min (ref >60)
GFR calc non Af Amer: >60 mL/min (ref >60)
Glucose, Bld: 143 mg/dL — ABNORMAL HIGH (ref 70–99)
Potassium: 3.7 mmol/L (ref 3.5–5.1)
Sodium: 139 mmol/L (ref 135–145)
Total Bilirubin: 0.8 mg/dL (ref 0.3–1.2)
Total Protein: 7 g/dL (ref 6.5–8.1)

## 2018-11-16 LAB — CBC WITH DIFFERENTIAL/PLATELET
Abs Immature Granulocytes: 0.01 10*3/uL (ref 0.00–0.07)
Basophils Absolute: 0.1 10*3/uL (ref 0.0–0.1)
Basophils Relative: 1 %
Eosinophils Absolute: 0.2 10*3/uL (ref 0.0–0.5)
Eosinophils Relative: 3 %
HCT: 36.3 % (ref 36.0–46.0)
Hemoglobin: 12.2 g/dL (ref 12.0–15.0)
Immature Granulocytes: 0 %
Lymphocytes Relative: 29 %
Lymphs Abs: 1.8 10*3/uL (ref 0.7–4.0)
MCH: 31.6 pg (ref 26.0–34.0)
MCHC: 33.6 g/dL (ref 30.0–36.0)
MCV: 94 fL (ref 80.0–100.0)
Monocytes Absolute: 0.7 10*3/uL (ref 0.1–1.0)
Monocytes Relative: 10 %
Neutro Abs: 3.7 10*3/uL (ref 1.7–7.7)
Neutrophils Relative %: 57 %
Platelets: 244 10*3/uL (ref 150–400)
RBC: 3.86 MIL/uL — ABNORMAL LOW (ref 3.87–5.11)
RDW: 13.8 % (ref 11.5–15.5)
WBC: 6.4 10*3/uL (ref 4.0–10.5)
nRBC: 0 % (ref 0.0–0.2)

## 2018-11-16 LAB — LACTATE DEHYDROGENASE: LDH: 154 U/L (ref 98–192)

## 2018-11-16 NOTE — Telephone Encounter (Signed)
This is an [facility] patient

## 2018-11-17 ENCOUNTER — Ambulatory Visit (HOSPITAL_COMMUNITY): Payer: Medicare Other | Admitting: Hematology

## 2018-11-17 NOTE — Telephone Encounter (Signed)
Please advise refill.  Scheduled F/U tomorrow (Wed. 11-18-2018).

## 2018-11-18 ENCOUNTER — Inpatient Hospital Stay (HOSPITAL_COMMUNITY): Payer: Medicare Other | Admitting: Hematology

## 2018-12-02 ENCOUNTER — Encounter (HOSPITAL_COMMUNITY): Payer: Self-pay | Admitting: Nurse Practitioner

## 2018-12-02 ENCOUNTER — Inpatient Hospital Stay (HOSPITAL_COMMUNITY): Payer: Medicare Other | Attending: Nurse Practitioner | Admitting: Nurse Practitioner

## 2018-12-02 ENCOUNTER — Other Ambulatory Visit: Payer: Self-pay

## 2018-12-02 VITALS — BP 130/84 | HR 84 | Temp 98.9°F | Resp 16 | Wt 271.0 lb

## 2018-12-02 DIAGNOSIS — I4891 Unspecified atrial fibrillation: Secondary | ICD-10-CM | POA: Insufficient documentation

## 2018-12-02 DIAGNOSIS — F419 Anxiety disorder, unspecified: Secondary | ICD-10-CM

## 2018-12-02 DIAGNOSIS — Z79811 Long term (current) use of aromatase inhibitors: Secondary | ICD-10-CM | POA: Diagnosis not present

## 2018-12-02 DIAGNOSIS — Z87891 Personal history of nicotine dependence: Secondary | ICD-10-CM | POA: Diagnosis not present

## 2018-12-02 DIAGNOSIS — Z9012 Acquired absence of left breast and nipple: Secondary | ICD-10-CM | POA: Diagnosis not present

## 2018-12-02 DIAGNOSIS — Z170 Estrogen receptor positive status [ER+]: Secondary | ICD-10-CM | POA: Diagnosis not present

## 2018-12-02 DIAGNOSIS — Z7901 Long term (current) use of anticoagulants: Secondary | ICD-10-CM | POA: Diagnosis not present

## 2018-12-02 DIAGNOSIS — E785 Hyperlipidemia, unspecified: Secondary | ICD-10-CM | POA: Diagnosis not present

## 2018-12-02 DIAGNOSIS — Z791 Long term (current) use of non-steroidal anti-inflammatories (NSAID): Secondary | ICD-10-CM | POA: Diagnosis not present

## 2018-12-02 DIAGNOSIS — C50112 Malignant neoplasm of central portion of left female breast: Secondary | ICD-10-CM | POA: Diagnosis not present

## 2018-12-02 DIAGNOSIS — Z7951 Long term (current) use of inhaled steroids: Secondary | ICD-10-CM | POA: Diagnosis not present

## 2018-12-02 DIAGNOSIS — Z1239 Encounter for other screening for malignant neoplasm of breast: Secondary | ICD-10-CM

## 2018-12-02 DIAGNOSIS — Z79899 Other long term (current) drug therapy: Secondary | ICD-10-CM | POA: Insufficient documentation

## 2018-12-02 NOTE — Progress Notes (Signed)
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  Insufficient material for HER2 testing.    12/28/2013 Breast MRI    Post biopsy changes located within the left breast laterally (upper-outer quadrant) related to the patient's recent stereotactic biopsy. Also, postsurgical scarring changes within the left breast. No areas of worrisome enhancement within the right breast.    01/26/2014 Definitive Surgery    Left modified radical mastectomy by Dr. Arnoldo Morale    02/01/2014 Pathology Results    Intermediate grade DCIS, 0.5 cm, 0/6 lymph nodes, negative resection margins.    03/13/2016 Imaging    Bone density- BMD as determined from Femur Neck Right is 1.023 g/cm2 with a T-Score of -0.1. This patient is considered normal according to [name]) criteria.      CANCER STAGING: Cancer Staging Breast cancer [facility]) Staging form: Breast, AJCC 7th Edition - Clinical stage from 02/01/2014: Stage 0 (Tis (DCIS), N0, M0) - Signed by Baird Cancer, PA-C on 12/01/2015    INTERVAL HISTORY:  Stephanie Jarvis 68 y.o. female returns for routine follow-up for breast cancer.  Patient reports she has been doing well since her last visit.  She denies any new lumps or masses felt.  She does feel a little fatigued from time to time.  She reports she does not have the energy to exercise.  However she is going to start trying to set a timer to exercise daily.  She denies any bleeding. Denies any nausea, vomiting, or diarrhea. Denies any new pains. Had  not noticed any recent bleeding such as epistaxis, hematuria or hematochezia. Denies recent chest pain on exertion, shortness of breath on minimal exertion, pre-syncopal episodes, or palpitations. Denies any numbness or tingling in hands or feet. Denies any recent fevers, infections, or recent hospitalizations. Patient reports appetite at 100% and energy level at 25%.  She is eating well and maintaining her weight at this time.    REVIEW OF SYSTEMS:  Review of Systems  Constitutional: Positive for fatigue.  Neurological: Positive for dizziness and numbness.  All other systems reviewed and are negative.    PAST MEDICAL/SURGICAL HISTORY:  Past Medical History:  Diagnosis Date  . Ankle pain   . Anxiety   . Arthritis   . Breast cancer ([place]) 2008   ILC of Left breast; ER+  . 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 GERD (gastroesophageal reflux disease)   . H/O hiatal hernia 2003  . History of atrial fibrillation 2011  . History of blood transfusion   . History of hepatitis A    Age 39  . History of pneumonia   . Hyperlipidemia   . Knee pain   . Sciatica   . Vertigo    Past Surgical History:  Procedure Laterality Date  . BREAST LUMPECTOMY Left 1982,2008   x 2   . CHOLECYSTECTOMY N/A 12/31/2016   Procedure: LAPAROSCOPIC CHOLECYSTECTOMY WITH POSSIBLE INTRAOPERATIVE CHOLANGIOGRAM;  Surgeon: Rolm Bookbinder, MD;  Location: [facility];  Service: General;  Laterality: N/A;  . COLONOSCOPY    . HIATAL HERNIA REPAIR  2003  . Lymph node removal  2008  . MASTECTOMY MODIFIED RADICAL Left 01/26/2014   Procedure: LEFT MODIFIED RADICAL MASTECTOMY;  Surgeon: Jamesetta So, MD;  Location: [facility];  Service: General;  Laterality: Left;  . PORT-A-CATH REMOVAL  2009 ish  . PORTA CATH INSERTION  [id number]  . TOTAL KNEE ARTHROPLASTY Right 2006  . TOTAL KNEE ARTHROPLASTY  08/12/2011   Procedure: TOTAL KNEE ARTHROPLASTY;  Surgeon: Arther Abbott, MD;  Location: [facility];  Service: Orthopedics;  Laterality: Left;  . TUBAL LIGATION  1980     SOCIAL HISTORY:  Social History   Socioeconomic History  . Marital status: Widowed    Spouse name: Not on file  . Number of children: 2  . Years of education: 38  . Highest education level: Not on file  Occupational History  . Occupation: retired  Scientific laboratory technician  . Financial resource strain: Not on file  . Food insecurity:    Worry: Not on file    Inability: Not on file  . Transportation needs:    Medical: Not on file    Non-medical: Not on file  Tobacco Use  . Smoking status: Former Smoker    Packs/day: 3.00    Years: 25.00    Pack years: 75.00    Types: Cigarettes    Last attempt to quit: 06/30/1998    Years since quitting: 20.4  . Smokeless tobacco: Never Used  Substance and Sexual Activity  . Alcohol use: Yes    Alcohol/week: 0.0 standard drinks    Comment: 2 glasses wine per month  . Drug use: No  . Sexual activity: Yes    Birth control/protection: Surgical  Lifestyle  . Physical activity:    Days per week: Not on file    Minutes per session: Not on file  . Stress: Not on file  Relationships  . Social connections:    Talks on phone: Not on file    Gets together: Not on file    Attends religious service: Not on file    Active member of club or organization: Not on file    Attends meetings of clubs or organizations: Not on file    Relationship status: Not on file  . Intimate partner violence:    Fear of current or ex partner: Not on file    Emotionally abused: Not on file    Physically abused: Not on file    Forced sexual activity: Not on file  Other Topics Concern  . Not on file  Social History Narrative   Drinks 2-3 cups of coffee a day     FAMILY HISTORY:  Family History  Problem Relation Age of Onset  . Stroke Father   . Heart disease Father   . Skin cancer Sister        dx. 38-58; unknown type  . Throat cancer Maternal Uncle 75       smoker; voicebox removed  .  Alzheimer's disease Paternal Grandmother   . Heart attack Paternal Grandfather   . Alzheimer's disease Maternal Aunt   . Heart disease Maternal Uncle   . Lung cancer Cousin        smoker  . Lung cancer Paternal Aunt        dx. mid-70s; worked at [employer]  . Heart attack Daughter        caused by thyroid issues  . Heart disease  Other   . Arthritis Other   . Lung disease Other   . Cancer Other   . Kidney disease Other     CURRENT MEDICATIONS:  Outpatient Encounter Medications as of 12/02/2018  Medication Sig Note  . albuterol (PROVENTIL) (2.5 MG/3ML) 0.083% nebulizer solution Inhale 3 mLs into the lungs every 4 (four) hours as needed.   Marland Kitchen anastrozole (ARIMIDEX) 1 MG tablet TAKE 1 TABLET DAILY   . apixaban (ELIQUIS) 5 MG TABS tablet Take 1 tablet (5 mg total) by mouth 2 (two) times daily.   . baclofen (LIORESAL) 20 MG tablet Take 1 tablet by mouth at bedtime as needed.   Marland Kitchen buPROPion (WELLBUTRIN XL) 300 MG 24 hr tablet Take 300 mg by mouth daily.   . Calcium Carbonate-Vit D-Min (CALCIUM 1200 PO) Take by mouth.   . fexofenadine (ALLEGRA) 180 MG tablet Take 180 mg by mouth daily.   . furosemide (LASIX) 40 MG tablet Take 1 tablet (40 mg total) by mouth 2 (two) times daily.   . metoprolol tartrate (LOPRESSOR) 25 MG tablet Take 1 tablet (25 mg total) by mouth 2 (two) times daily.   Marland Kitchen omeprazole (PRILOSEC) 20 MG capsule Take 20 mg by mouth 2 (two) times daily.    . potassium chloride SA (KLOR-CON M20) 20 MEQ tablet Take 2 tablets (40 mEq total) by mouth every morning. & 20 meq in the evening   . rosuvastatin (CRESTOR) 20 MG tablet Take 20 mg by mouth at bedtime.  04/01/2016: Received from: External Pharmacy  . sertraline (ZOLOFT) 100 MG tablet Take 100 mg by mouth at bedtime.   . [DISCONTINUED] PROVENTIL HFA 108 (90 Base) MCG/ACT inhaler Inhale 2 puffs into the lungs every 6 (six) hours as needed for wheezing or shortness of breath.  08/04/2015: Received from: External Pharmacy  .  [DISCONTINUED] meloxicam (MOBIC) 7.5 MG tablet Take 15 mg by mouth daily.     No facility-administered encounter medications on file as of 12/02/2018.     ALLERGIES:  Allergies  Allergen Reactions  . No Known Allergies      PHYSICAL EXAM:  ECOG Performance status: 1  Vitals:   12/02/18 0900  BP: 130/84  Pulse: 84  Resp: 16  Temp: 98.9 F (37.2 C)  SpO2: 96%   Filed Weights   12/02/18 0900  Weight: 271 lb (122.9 kg)    Physical Exam Constitutional:      Appearance: Normal appearance. She is normal weight.  Cardiovascular:     Rate and Rhythm: Normal rate and regular rhythm.     Heart sounds: Normal heart sounds.  Pulmonary:     Effort: Pulmonary effort is normal.     Breath sounds: Normal breath sounds.  Abdominal:     General: Bowel sounds are normal.     Palpations: Abdomen is soft.  Musculoskeletal: Normal range of motion.  Skin:    General: Skin is warm and dry.  Neurological:     Mental Status: She is alert and oriented to person, place, and time. Mental status is at baseline.  Psychiatric:        Mood and Affect: Mood normal.        Behavior: Behavior normal.        Thought Content: Thought content normal.        Judgment: Judgment normal.   Breast: RIGHT: No palpable masses, no skin changes or nipple discharge, no adenopathy. LEFT: Mastectomy site WNL.  No palpable masses.  No skin changes.   LABORATORY DATA:  I have reviewed the labs as listed.  CBC    Component Value Date/Time   WBC 6.4 11/16/2018 0816   RBC 3.86 (L) 11/16/2018 0816   HGB 12.2 11/16/2018 0816   HCT 36.3 11/16/2018 0816   PLT 244 11/16/2018 0816   MCV 94.0 11/16/2018 0816   MCH 31.6 11/16/2018 0816   MCHC 33.6 11/16/2018 0816   RDW 13.8 11/16/2018 0816   LYMPHSABS 1.8 11/16/2018 0816   MONOABS 0.7 11/16/2018 0816   EOSABS 0.2 11/16/2018 0816   BASOSABS 0.1 11/16/2018 0816   CMP Latest Ref Rng & Units 11/16/2018 08/31/2018 07/02/2018  Glucose 70 - 99 mg/dL 143(H) 146(H)  173(H)  BUN 8 - 23 mg/dL _0 Creatinine 0.44 - 1.00 mg/dL 0.71 0.81 0.92  Sodium 135 - 145 mmol/L 139 139 140  Potassium 3.5 - 5.1 mmol/L 3.7 3.6 3.3(L)  Chloride 98 - 111 mmol/L 100 102 105  CO2 22 - 32 mmol/L _1 Calcium 8.9 - 10.3 mg/dL 9.5 9.1 8.6(L)  Total Protein 6.5 - 8.1 g/dL 7.0 - -  Total Bilirubin 0.3 - 1.2 mg/dL 0.8 - -  Alkaline Phos 38 - 126 U/L 72 - -  AST 15 - 41 U/L 25 - -  ALT 0 - 44 U/L 27 - -       DIAGNOSTIC IMAGING:  I have independently reviewed the mammogram and discussed with the patient.   I personally performed a face-to-face visit.  All questions were answered to patient's stated satisfaction. Encouraged patient to call with any new concerns or questions before his next visit to the cancer center and we can certain see him sooner, if needed.     ASSESSMENT & PLAN:   Breast cancer ([place]) 1.  Stage I intermediate grade DCIS: - Diagnosed in 2015, status post left simple mastectomy, ER/PR positive, HER-2 unknown (insufficient tumor for her HER-2 status), Ki-67 of 4%. - She was originally diagnosed with breast cancer in the left breast in 2008.  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  Status post lumpectomy, sentinel node biopsy, Oncotype DX score 35, PR/PR positive, HER-2 negative.  She had 4 cycles of Taxotere/Cytoxan ending on 02/12/2007, radiation to the left breast.  She took anastrozole 1 mg daily from 01/2007 until 01/2012.  She was treated in [place]. - She was started back on Arimidex in 2015 and continues to tolerate therapy well.  She will complete 5 years of therapy at the end of June 2020. - She had her last mammogram on 11/16/2018 which showed B RADS category 1 negative. -Upon physical assessment today did not reveal any adenopathy or masses. -Labs on 11/16/2018 showed her potassium 3.7, creatinine 0.71, LDH 154, WBC 6.1, hemoglobin 12.2, platelets 244. - She will continue to follow-up yearly with mammograms labs  and office visit.  2.  Atrial fibrillation: - She is on Eliquis for this. -She follows up with her cardiology as directed.  3.  Health maintenance: -She had her last bone density test done on 03/18/2018 it was normal. -She is still recommended to take calcium and vitamin D daily. -She will have her bone density test repeated 03/2020 we will schedule this at her next visit.      Orders placed this encounter:  Orders Placed This Encounter  Procedures  . MM Digital Screening Unilat R  . Lactate dehydrogenase  . CBC with Differential/Platelet  . Comprehensive metabolic panel  . Ferritin  . Iron and  TIBC  . Vitamin B12  . VITAMIN D 25 Hydroxy (Vit-D Deficiency, Fractures)  . Folate      Natale Lay, FNP-C [place] [phone]

## 2018-12-02 NOTE — Assessment & Plan Note (Addendum)
1.  Stage I intermediate grade DCIS: - Diagnosed in 2015, status post left simple mastectomy, ER/PR positive, HER-2 unknown (insufficient tumor for her HER-2 status), Ki-67 of 4%. - She was originally diagnosed with breast cancer in the left breast in 2008.  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  Status post lumpectomy, sentinel node biopsy, Oncotype DX score 35, PR/PR positive, HER-2 negative.  She had 4 cycles of Taxotere/Cytoxan ending on 02/12/2007, radiation to the left breast.  She took anastrozole 1 mg daily from 01/2007 until 01/2012.  She was treated in [place]. - She was started back on Arimidex in 2015 and continues to tolerate therapy well.  She will complete 5 years of therapy at the end of June 2020. - She had her last mammogram on 11/16/2018 which showed B RADS category 1 negative. -Upon physical assessment today did not reveal any adenopathy or masses. -Labs on 11/16/2018 showed her potassium 3.7, creatinine 0.71, LDH 154, WBC 6.1, hemoglobin 12.2, platelets 244. - She will continue to follow-up yearly with mammograms labs and office visit.  2.  Atrial fibrillation: - She is on Eliquis for this. -She follows up with her cardiology as directed.  3.  Health maintenance: -She had her last bone density test done on 03/18/2018 it was normal. -She is still recommended to take calcium and vitamin D daily. -She will have her bone density test repeated 03/2020 we will schedule this at her next visit.

## 2018-12-02 NOTE — Patient Instructions (Signed)
[facility] Discharge Instructions  Follow up in 1 year with labs and mammogram   Thank you for choosing [facility] at [facility] to provide your oncology and hematology care.  To afford each patient quality time with our provider, please arrive at least 15 minutes before your scheduled appointment time.   If you have a lab appointment with the Cancer Center please come in thru the  Main Entrance and check in at the main information desk  You need to re-schedule your appointment should you arrive 10 or more minutes late.  We strive to give you quality time with our providers, and arriving late affects you and other patients whose appointments are after yours.  Also, if you no show three or more times for appointments you may be dismissed from the clinic at the providers discretion.     Again, thank you for choosing [facility].  Our hope is that these requests will decrease the amount of time that you wait before being seen by our physicians.       _____________________________________________________________  Should you have questions after your visit to  Cancer Center, please contact our office at [phone] between the hours of 8:00 a.m. and 4:30 p.m.  Voicemails left after 4:00 p.m. will not be returned until the following business day.  For prescription refill requests, have your pharmacy contact our office and allow 72 hours.    Cancer Center Support Programs:   > Cancer Support Group  2nd Tuesday of the month 1pm-2pm, Journey Room    

## 2018-12-10 ENCOUNTER — Telehealth: Payer: Self-pay | Admitting: *Deleted

## 2018-12-10 DIAGNOSIS — H25813 Combined forms of age-related cataract, bilateral: Secondary | ICD-10-CM | POA: Diagnosis not present

## 2018-12-10 NOTE — Telephone Encounter (Signed)
LMVM for pt to return call. Due to current COVID 19 pandemic, our office is severely reducing in office visits until further notice, in order to minimize the risk to our patients and healthcare providers.Unable to get in contact with the patient to convert their office visit with [facility] on 12/15/2018 into a doxy.me visit to mychart video visit. I left a voicemail asking the patient to return my call. Office number was provided.     If patient calls back please convert their office visit into a doxy.me visit or my chart video visit.

## 2018-12-14 ENCOUNTER — Encounter: Payer: Self-pay | Admitting: Adult Health

## 2018-12-15 ENCOUNTER — Ambulatory Visit: Payer: Medicare Other | Admitting: Adult Health

## 2018-12-15 ENCOUNTER — Other Ambulatory Visit: Payer: Self-pay

## 2018-12-15 ENCOUNTER — Encounter: Payer: Self-pay | Admitting: Adult Health

## 2018-12-15 ENCOUNTER — Ambulatory Visit (INDEPENDENT_AMBULATORY_CARE_PROVIDER_SITE_OTHER): Payer: Medicare Other | Admitting: Adult Health

## 2018-12-15 VITALS — BP 137/81 | HR 72 | Temp 96.2°F | Ht 66.0 in | Wt 269.4 lb

## 2018-12-15 DIAGNOSIS — G4733 Obstructive sleep apnea (adult) (pediatric): Secondary | ICD-10-CM | POA: Diagnosis not present

## 2018-12-15 DIAGNOSIS — Z9989 Dependence on other enabling machines and devices: Secondary | ICD-10-CM

## 2018-12-15 NOTE — Progress Notes (Signed)
PATIENT: Stephanie Jarvis DOB: 1951/02/15  REASON FOR VISIT: follow up HISTORY FROM: patient  HISTORY OF PRESENT ILLNESS: Today 12/15/18:  Stephanie Jarvis is a 68 year old female with a history of obstructive sleep apnea on CPAP.  Her download indicates that she used her machine 30 out of 30 days for compliance of 100%.  She used her machine every night greater than 4 hours.  On average she uses her machine 8 hours and 26 minutes.  Her residual AHI is 2.6 on 11 cm of water with EPR 3.  She does not have a significant leak.  She states that she continues to see the benefit of using the CPAP machine.  She denies any new issues.  HISTORY 12/11/2017: I reviewed her CPAP compliance data from 11/10/2017 through 12/09/2017 which is a total of 30 days, during which time she used her CPAP every night with percent used days greater than 4 hours at 100%, indicating superb compliance with an average usage of 8 hours and 39 minutes, residual AHI at goal at 1.9 per hour, leak acceptable with the 95th percentile at 10.5 L/m on a pressure of 10 cm with EPR of 3. She reportsdoing well, no issues with CPAP machine or supplies, DME [facility] which keeps her up to date with her supplies. She had an episode of A. fib recently. She endorses having had some stress, she and her husband moved to a smaller home. Otherwise, cancer follow-up has been good.  REVIEW OF SYSTEMS: Out of a complete 14 system review of symptoms, the patient complains only of the following symptoms, and all other reviewed systems are negative.  See HPI Epworth sleepiness score is 7  ALLERGIES: Allergies  Allergen Reactions   No Known Allergies     HOME MEDICATIONS: Outpatient Medications Prior to Visit  Medication Sig Dispense Refill   albuterol (PROVENTIL) (2.5 MG/3ML) 0.083% nebulizer solution Inhale 3 mLs into the lungs every 4 (four) hours as needed.     anastrozole (ARIMIDEX) 1 MG tablet TAKE 1 TABLET DAILY 90 tablet  3   apixaban (ELIQUIS) 5 MG TABS tablet Take 1 tablet (5 mg total) by mouth 2 (two) times daily. 180 tablet 3   baclofen (LIORESAL) 20 MG tablet Take 1 tablet by mouth at bedtime as needed.     buPROPion (WELLBUTRIN XL) 300 MG 24 hr tablet Take 300 mg by mouth daily.     Calcium Carbonate-Vit D-Min (CALCIUM 1200 PO) Take by mouth.     fexofenadine (ALLEGRA) 180 MG tablet Take 180 mg by mouth daily.     furosemide (LASIX) 40 MG tablet Take 1 tablet (40 mg total) by mouth 2 (two) times daily. 180 tablet 3   metoprolol tartrate (LOPRESSOR) 25 MG tablet Take 1 tablet (25 mg total) by mouth 2 (two) times daily. 180 tablet 3   omeprazole (PRILOSEC) 20 MG capsule Take 20 mg by mouth 2 (two) times daily.      potassium chloride SA (KLOR-CON M20) 20 MEQ tablet Take 2 tablets (40 mEq total) by mouth every morning. & 20 meq in the evening 180 tablet 3   rosuvastatin (CRESTOR) 20 MG tablet Take 20 mg by mouth at bedtime.      sertraline (ZOLOFT) 100 MG tablet Take 100 mg by mouth at bedtime.     No facility-administered medications prior to visit.     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%   GERD (gastroesophageal reflux disease)    H/O hiatal hernia 2003   History of atrial fibrillation 2011   History of blood transfusion    History of hepatitis A    Age 15   History of pneumonia    Hyperlipidemia    Knee pain    Sciatica    Vertigo     PAST SURGICAL HISTORY: Past Surgical History:  Procedure Laterality Date   BREAST LUMPECTOMY Left 7846,9629   x 2    CHOLECYSTECTOMY N/A 12/31/2016   Procedure: LAPAROSCOPIC CHOLECYSTECTOMY WITH POSSIBLE INTRAOPERATIVE CHOLANGIOGRAM;  Surgeon: Rolm Bookbinder, MD;  Location: [facility];  Service: General;  Laterality: N/A;   COLONOSCOPY      HIATAL HERNIA REPAIR  2003   Lymph node removal  2008   MASTECTOMY MODIFIED RADICAL Left 01/26/2014   Procedure: LEFT MODIFIED RADICAL MASTECTOMY;  Surgeon: Jamesetta So, MD;  [facility];  Service: General;  Laterality: Left;   PORT-A-CATH REMOVAL  2009 ish   PORTA CATH INSERTION  [id number]   TOTAL KNEE ARTHROPLASTY Right 2006   TOTAL KNEE ARTHROPLASTY  08/12/2011   Procedure: TOTAL KNEE ARTHROPLASTY;  Surgeon: Arther Abbott, MD;  [facility];  Service: Orthopedics;  Laterality: Left;   TUBAL LIGATION  1980    FAMILY HISTORY: Family History  Problem Relation Age of Onset   Stroke Father    Heart disease Father    Skin cancer Sister        dx. 48-58; unknown type   Throat cancer Maternal Uncle 48       smoker; voicebox removed   Alzheimer's disease Paternal Grandmother    Heart attack Paternal Grandfather    Alzheimer's disease Maternal Aunt    Heart disease Maternal Uncle    Lung cancer Cousin        smoker   Lung cancer Paternal Aunt        dx. mid-70s; worked at [employer] attack Daughter        caused by thyroid issues   Heart disease Other    Arthritis Other    Lung disease Other    Cancer Other    Kidney disease Other     SOCIAL HISTORY: Social History   Socioeconomic History   Marital status: Widowed    Spouse name: Not on file   Number of children: 2   Years of education: 12   Highest education level: Not on file  Occupational History   Occupation: retired  Scientist, product/process development strain: Not on file   Food insecurity    Worry: Not on file    Inability: Not on Lexicographer needs    Medical: Not on file    Non-medical: Not on file  Tobacco Use   Smoking status: Former Smoker    Packs/day: 3.00    Years: 25.00    Pack years: 75.00    Types: Cigarettes    Quit date: 06/30/1998    Years since quitting: 20.4   Smokeless tobacco: Never Used  Substance and Sexual  Activity   Alcohol use: Yes    Alcohol/week: 0.0 standard drinks    Comment: 2 glasses wine per month   Drug use: No   Sexual activity: Yes    Birth control/protection:  Surgical  Lifestyle   Physical activity    Days per week: Not on file    Minutes per session: Not on file   Stress: Not on file  Relationships   Social connections    Talks on phone: Not on file    Gets together: Not on file    Attends religious service: Not on file    Active member of club or organization: Not on file    Attends meetings of clubs or organizations: Not on file    Relationship status: Not on file   Intimate partner violence    Fear of current or ex partner: Not on file    Emotionally abused: Not on file    Physically abused: Not on file    Forced sexual activity: Not on file  Other Topics Concern   Not on file  Social History Narrative   Drinks 2-3 cups of coffee a day       PHYSICAL EXAM  Vitals:   12/15/18 0927  Temp: (!) 96.2 F (35.7 C)  Weight: 269 lb 6.4 oz (122.2 kg)  Height: 5' 6"  (1.676 m)   Body mass index is 43.48 kg/m.  Generalized: Well developed, in no acute distress  Chest: Lung sounds clear to auscultation bilaterally  Neurological examination  Mentation: Alert oriented to time, place, history taking. Follows all commands speech and language fluent Cranial nerve II-XII: Pupils were equal round reactive to light. Extraocular movements were full, visual field were full on confrontational test. Facial sensation and strength were normal. Uvula tongue midline. Head turning and shoulder shrug  were normal and symmetric. Motor: The motor testing reveals 5 over 5 strength of all 4 extremities. Good symmetric motor tone is noted throughout.  Sensory: Sensory testing is intact to soft touch on all 4 extremities. No evidence of extinction is noted.  Coordination: Cerebellar testing reveals good finger-nose-finger and heel-to-shin bilaterally.  Gait and station: Gait is  normal.    DIAGNOSTIC DATA (LABS, IMAGING, TESTING) - I reviewed patient records, labs, notes, testing and imaging myself where available.  Lab Results  Component Value Date   WBC 6.4 11/16/2018   HGB 12.2 11/16/2018   HCT 36.3 11/16/2018   MCV 94.0 11/16/2018   PLT 244 11/16/2018      Component Value Date/Time   NA 139 11/16/2018 0816   K 3.7 11/16/2018 0816   CL 100 11/16/2018 0816   CO2 27 11/16/2018 0816   GLUCOSE 143 (H) 11/16/2018 0816   BUN 11 11/16/2018 0816   CREATININE 0.71 11/16/2018 0816   CALCIUM 9.5 11/16/2018 0816   PROT 7.0 11/16/2018 0816   ALBUMIN 3.7 11/16/2018 0816   AST 25 11/16/2018 0816   ALT 27 11/16/2018 0816   ALKPHOS 72 11/16/2018 0816   BILITOT 0.8 11/16/2018 0816   GFRNONAA >60 11/16/2018 0816   GFRAA >60 11/16/2018 0816     ASSESSMENT AND PLAN 68 y.o. year old female  has a past medical history of Ankle pain, Anxiety, Arthritis, Breast cancer ([place]) (2008), Breast cancer ([place]) (2015), GERD (gastroesophageal reflux disease), H/O hiatal hernia (2003), History of atrial fibrillation (2011), History of blood transfusion, History of hepatitis A, History of pneumonia, Hyperlipidemia, Knee pain, Sciatica, and Vertigo. here with :  1.  Obstructive sleep apnea on CPAP  The patient CPAP download shows excellent compliance and good treatment of her apnea.  She is encouraged to continue the CPAP nightly and greater than 4 hours each night.  She is advised that if  her symptoms worsen or she develops new symptoms she should let us know.  She will follow-up in 6 months or sooner if needed.   I spent 15 minutes with the patient. 50% of this time was spent reviewing her CPAP download   Ward Givens, MSN, NP-C 12/15/2018, 9:29 AM [facility] [address] [place], [place] [address] [phone]

## 2018-12-15 NOTE — Patient Instructions (Signed)
Your Plan:  Continue using CPAP nightly If your symptoms worsen or you develop new symptoms please let us know.   Thank you for coming to see us at [facility]. I hope we have been able to provide you high quality care today.  You may receive a patient satisfaction survey over the next few weeks. We would appreciate your feedback and comments so that we may continue to improve ourselves and the health of our patients.  

## 2018-12-18 DIAGNOSIS — H25811 Combined forms of age-related cataract, right eye: Secondary | ICD-10-CM | POA: Diagnosis not present

## 2018-12-21 DIAGNOSIS — I480 Paroxysmal atrial fibrillation: Secondary | ICD-10-CM | POA: Diagnosis not present

## 2018-12-21 DIAGNOSIS — K219 Gastro-esophageal reflux disease without esophagitis: Secondary | ICD-10-CM | POA: Diagnosis not present

## 2018-12-21 DIAGNOSIS — J4520 Mild intermittent asthma, uncomplicated: Secondary | ICD-10-CM | POA: Diagnosis not present

## 2018-12-27 DIAGNOSIS — H2512 Age-related nuclear cataract, left eye: Secondary | ICD-10-CM | POA: Diagnosis not present

## 2018-12-30 DIAGNOSIS — H25812 Combined forms of age-related cataract, left eye: Secondary | ICD-10-CM | POA: Diagnosis not present

## 2019-01-20 DIAGNOSIS — I499 Cardiac arrhythmia, unspecified: Secondary | ICD-10-CM | POA: Diagnosis not present

## 2019-01-20 DIAGNOSIS — E785 Hyperlipidemia, unspecified: Secondary | ICD-10-CM | POA: Diagnosis not present

## 2019-02-03 ENCOUNTER — Ambulatory Visit (INDEPENDENT_AMBULATORY_CARE_PROVIDER_SITE_OTHER): Payer: Medicare Other | Admitting: Cardiology

## 2019-02-03 ENCOUNTER — Other Ambulatory Visit: Payer: Self-pay

## 2019-02-03 ENCOUNTER — Encounter: Payer: Self-pay | Admitting: Cardiology

## 2019-02-03 VITALS — BP 142/68 | HR 82 | Temp 95.9°F | Ht 66.0 in | Wt 262.0 lb

## 2019-02-03 DIAGNOSIS — I313 Pericardial effusion (noninflammatory): Secondary | ICD-10-CM

## 2019-02-03 DIAGNOSIS — E782 Mixed hyperlipidemia: Secondary | ICD-10-CM | POA: Diagnosis not present

## 2019-02-03 DIAGNOSIS — I3139 Other pericardial effusion (noninflammatory): Secondary | ICD-10-CM

## 2019-02-03 DIAGNOSIS — I480 Paroxysmal atrial fibrillation: Secondary | ICD-10-CM | POA: Diagnosis not present

## 2019-02-03 NOTE — Progress Notes (Signed)
Cardiology Office Note  Date: 02/03/2019   ID: Stephanie Jarvis, Stephanie Jarvis [birth year], MRN [id number]  PCP:  Stephanie Fire, MD  Cardiologist:  Rozann Lesches, MD Electrophysiologist:  None   Chief Complaint  Patient presents with  . Cardiac follow-up    History of Present Illness: Stephanie Jarvis is a 68 y.o. female last seen in January.  She presents for a routine visit.  Since last encounter she does not report any chest pain or palpitations.  She states that she has been taking her medications regularly.  I reviewed her medication list, cardiac regimen including Eliquis and Lopressor as before.  She had lab work done in May which I reviewed.  I personally reviewed her ECG today which shows sinus rhythm with poor R wave progression.   Past Medical History:  Diagnosis Date  . Ankle pain   . Anxiety   . Arthritis   . Breast cancer ([place]) 2008   ILC of Left breast; ER+  . 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 GERD (gastroesophageal reflux disease)   . H/O hiatal hernia 2003  . History of atrial fibrillation 2011  . History of blood transfusion   . History of hepatitis A    Age 69  . History of pneumonia   . Hyperlipidemia   . Knee pain   . Sciatica   . Vertigo     Past Surgical History:  Procedure Laterality Date  . BREAST LUMPECTOMY Left 1982,2008   x 2   . CHOLECYSTECTOMY N/A 12/31/2016   Procedure: LAPAROSCOPIC CHOLECYSTECTOMY WITH POSSIBLE INTRAOPERATIVE CHOLANGIOGRAM;  Surgeon: Rolm Bookbinder, MD;  Location: [facility];  Service: General;  Laterality: N/A;  . COLONOSCOPY    . HIATAL HERNIA REPAIR  2003  . Lymph node removal  2008  . MASTECTOMY MODIFIED RADICAL Left 01/26/2014   Procedure: LEFT MODIFIED RADICAL MASTECTOMY;  Surgeon: Jamesetta So, MD;  [facility];  Service: General;  Laterality: Left;  . PORT-A-CATH REMOVAL  2009 ish  . PORTA CATH INSERTION  [id number]  . TOTAL KNEE ARTHROPLASTY Right 2006  .  TOTAL KNEE ARTHROPLASTY  08/12/2011   Procedure: TOTAL KNEE ARTHROPLASTY;  Surgeon: Arther Abbott, MD;  [facility];  Service: Orthopedics;  Laterality: Left;  . TUBAL LIGATION  1980    Current Outpatient Medications  Medication Sig Dispense Refill  . albuterol (PROVENTIL) (2.5 MG/3ML) 0.083% nebulizer solution Inhale 3 mLs into the lungs every 4 (four) hours as needed.    Marland Kitchen apixaban (ELIQUIS) 5 MG TABS tablet Take 1 tablet (5 mg total) by mouth 2 (two) times daily. 180 tablet 3  . baclofen (LIORESAL) 20 MG tablet Take 1 tablet by mouth at bedtime as needed.    Marland Kitchen buPROPion (WELLBUTRIN XL) 300 MG 24 hr tablet Take 300 mg by mouth daily.    . Calcium Carbonate-Vit D-Min (CALCIUM 1200 PO) Take by mouth.    . fexofenadine (ALLEGRA) 180 MG tablet Take 180 mg by mouth daily.    . furosemide (LASIX) 40 MG tablet Take 1 tablet (40 mg total) by mouth 2 (two) times daily. 180 tablet 3  . metoprolol tartrate (LOPRESSOR) 25 MG tablet Take 1 tablet (25 mg total) by mouth 2 (two) times daily. 180 tablet 3  . omeprazole (PRILOSEC) 20 MG capsule Take 20 mg by mouth 2 (two) times daily.     . potassium chloride SA (KLOR-CON M20) 20 MEQ tablet Take 2  tablets (40 mEq total) by mouth every morning. & 20 meq in the evening (Patient taking differently: Take 20 mEq by mouth 2 (two) times daily. & 20 meq in the evening) 180 tablet 3  . rosuvastatin (CRESTOR) 20 MG tablet Take 20 mg by mouth at bedtime.     . sertraline (ZOLOFT) 100 MG tablet Take 100 mg by mouth at bedtime.     No current facility-administered medications for this visit.    Allergies:  No known allergies   Social History: The patient  reports that she quit smoking about 20 years ago. Her smoking use included cigarettes. She has a 75.00 pack-year smoking history. She has never used smokeless tobacco. She reports current alcohol use. She reports that she does not use drugs.   ROS:  Please see the history of present illness. Otherwise,  complete review of systems is positive for none.  All other systems are reviewed and negative.   Physical Exam: VS:  BP (!) 142/68   Pulse 82   Temp (!) 95.9 F (35.5 C)   Ht 5' 6"  (1.676 m)   Wt 262 lb (118.8 kg)   SpO2 95%   BMI 42.29 kg/m , BMI Body mass index is 42.29 kg/m.  Wt Readings from Last 3 Encounters:  02/03/19 262 lb (118.8 kg)  12/15/18 269 lb 6.4 oz (122.2 kg)  12/02/18 271 lb (122.9 kg)    General: Obese woman, appears comfortable at rest. HEENT: Conjunctiva and lids normal, wearing a mask. Neck: Supple, no elevated JVP or carotid bruits, no thyromegaly. Lungs: Clear to auscultation, nonlabored breathing at rest. Cardiac: Regular rate and rhythm, no S3 or significant systolic murmur. Abdomen: Soft, nontender, bowel sounds present. Extremities: Trace ankle edema, distal pulses 2+. Skin: Warm and dry. Musculoskeletal: No kyphosis. Neuropsychiatric: Alert and oriented x3, affect grossly appropriate.  ECG:  An ECG dated 11/19/2017 was personally reviewed today and demonstrated:  Sinus arrhythmia.  Poor R wave progression.  Recent Labwork: 11/16/2018: ALT 27; AST 25; BUN 11; Creatinine, Ser 0.71; Hemoglobin 12.2; Platelets 244; Potassium 3.7; Sodium 139   Other Studies Reviewed Today:  Echocardiogram 02/17/2018: Study Conclusions  - Limited echo to evaluate pericardial effusion. - Left ventricle: The cavity size was normal. Wall thickness was increased in a pattern of mild LVH. Systolic function was normal. The estimated ejection fraction was in the range of 60% to 65%. Wall motion was normal; there were no regional wall motion abnormalities. - Inferior vena cava: The vessel was normal in size. The respirophasic diameter changes were in the normal range (= 50%), consistent with normal central venous pressure. - Pericardium, extracardiac: There is a small to moderate circumferential pericardial effusion measuring 1.3 cm in diastole adjacent  to the LV. There is no tamonade physiology by echo. - Stable, perhaps mildly decreased pericardial effusion compared to prior study 10/28/2017.  Assessment and Plan:  1.  Paroxysmal atrial fibrillation with CHADSVASC score of 3.  She is tolerating Eliquis and Lopressor.  No progressive palpitations.  Lab work from May reviewed.  2.  Small to moderate asymptomatic pericardial effusion by echocardiogram last year.  Will discuss follow-up echocardiogram around the time of her next visit.  3.  Mixed hyperlipidemia on Crestor.  She continues to follow with Dr. Legrand Rams.  Medication Adjustments/Labs and Tests Ordered: Current medicines are reviewed at length with the patient today.  Concerns regarding medicines are outlined above.   Tests Ordered: Orders Placed This Encounter  Procedures  . EKG 12-Lead  Medication Changes: No orders of the defined types were placed in this encounter.   Disposition:  Follow up 6 months in the [place] office.  Signed, Satira Sark, MD, [facility] 02/03/2019 3:59 PM    [facility] [place], [place], [place] [address] Phone: [phone]; Fax: [phone]

## 2019-02-03 NOTE — Patient Instructions (Addendum)

## 2019-02-20 DIAGNOSIS — I499 Cardiac arrhythmia, unspecified: Secondary | ICD-10-CM | POA: Diagnosis not present

## 2019-02-20 DIAGNOSIS — E785 Hyperlipidemia, unspecified: Secondary | ICD-10-CM | POA: Diagnosis not present

## 2019-03-23 DIAGNOSIS — E785 Hyperlipidemia, unspecified: Secondary | ICD-10-CM | POA: Diagnosis not present

## 2019-03-23 DIAGNOSIS — K219 Gastro-esophageal reflux disease without esophagitis: Secondary | ICD-10-CM | POA: Diagnosis not present

## 2019-04-08 ENCOUNTER — Other Ambulatory Visit: Payer: Self-pay | Admitting: *Deleted

## 2019-04-08 MED ORDER — APIXABAN 5 MG PO TABS: 5.0000 mg | ORAL_TABLET | Freq: Two times a day (BID) | ORAL | 3 refills | Status: DC

## 2019-04-15 ENCOUNTER — Telehealth: Payer: Self-pay | Admitting: Cardiology

## 2019-04-15 NOTE — Telephone Encounter (Signed)
Patient called stating that she was returning a call. She would like to be called at [phone] (after 3pm)

## 2019-04-15 NOTE — Telephone Encounter (Signed)
Reports vaginal bleeding with some clots. Reports having some spotting in the past but it has increased. Denies dizziness, chest pain, sob or fatigue. Advised to contact her PCP asap for an evaluation. Says she does not have a OB/Gyn. Verbalized understanding of plan.

## 2019-04-15 NOTE — Telephone Encounter (Signed)
Patient is available now.

## 2019-04-22 ENCOUNTER — Other Ambulatory Visit: Payer: Self-pay | Admitting: Obstetrics & Gynecology

## 2019-04-22 ENCOUNTER — Other Ambulatory Visit: Payer: Self-pay

## 2019-04-22 ENCOUNTER — Other Ambulatory Visit (HOSPITAL_COMMUNITY)
Admission: RE | Admit: 2019-04-22 | Discharge: 2019-04-22 | Disposition: A | Discharge status: Home / Self Care | Payer: Medicare Other | Source: Ambulatory Visit | Attending: Obstetrics and Gynecology | Admitting: Obstetrics and Gynecology

## 2019-04-22 ENCOUNTER — Encounter: Payer: Self-pay | Admitting: Obstetrics and Gynecology

## 2019-04-22 ENCOUNTER — Ambulatory Visit (INDEPENDENT_AMBULATORY_CARE_PROVIDER_SITE_OTHER): Payer: Medicare Other | Admitting: Obstetrics and Gynecology

## 2019-04-22 ENCOUNTER — Other Ambulatory Visit: Payer: Self-pay | Admitting: Obstetrics and Gynecology

## 2019-04-22 VITALS — Ht 65.0 in | Wt 264.6 lb

## 2019-04-22 DIAGNOSIS — E785 Hyperlipidemia, unspecified: Secondary | ICD-10-CM | POA: Diagnosis not present

## 2019-04-22 DIAGNOSIS — Z780 Asymptomatic menopausal state: Secondary | ICD-10-CM | POA: Diagnosis not present

## 2019-04-22 DIAGNOSIS — Z124 Encounter for screening for malignant neoplasm of cervix: Secondary | ICD-10-CM

## 2019-04-22 DIAGNOSIS — R87619 Unspecified abnormal cytological findings in specimens from cervix uteri: Secondary | ICD-10-CM | POA: Insufficient documentation

## 2019-04-22 DIAGNOSIS — N939 Abnormal uterine and vaginal bleeding, unspecified: Secondary | ICD-10-CM | POA: Diagnosis not present

## 2019-04-22 DIAGNOSIS — C541 Malignant neoplasm of endometrium: Secondary | ICD-10-CM | POA: Diagnosis not present

## 2019-04-22 DIAGNOSIS — Z1151 Encounter for screening for human papillomavirus (HPV): Secondary | ICD-10-CM | POA: Diagnosis not present

## 2019-04-22 DIAGNOSIS — K219 Gastro-esophageal reflux disease without esophagitis: Secondary | ICD-10-CM | POA: Diagnosis not present

## 2019-04-22 NOTE — Progress Notes (Addendum)
Patient ID: MATRICIA BEGNAUD, female   DOB: Dec 17, 1950, 68 y.o.   MRN: [id number]    [facility] Visit  @DATE @            Patient name: Stephanie Jarvis MRN [id number]  Date of birth: 28-Sep-1950  CC & HPI:  Stephanie Jarvis is a 68 y.o. female NEW PT presenting today for vaginal bleeding with clots. In August, she began spotting but it has gotten progressively worse in the past couple of weeks. She is currently taking Eliquis 5 mg BID as prescribed by her cardiologist, but the bleeding started before she began taking it. She was on Arimidex up until July 2020 and the bleeding began soon after she stopped taking it.  The pt has not had a pap smear recently and is not sexually active. She has been postmenopausal for many years. The patient denies fever, chills or any other symptoms or complaints at this time.   ROS:  ROS  + vaginal bleeding with clots - fever - chills All systems are negative except as noted in the HPI and PMH.   Pertinent History Reviewed:   Reviewed: Medical         Past Medical History:  Diagnosis Date  . Ankle pain   . Anxiety   . Arthritis   . Breast cancer ([place]) 2008   ILC of Left breast; ER+  . 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 GERD (gastroesophageal reflux disease)   . H/O hiatal hernia 2003  . History of atrial fibrillation 2011  . History of blood transfusion   . History of hepatitis A    Age 66  . History of pneumonia   . Hyperlipidemia   . Knee pain   . Sciatica   . Vertigo                               Surgical Hx:    Past Surgical History:  Procedure Laterality Date  . BREAST LUMPECTOMY Left 1982,2008   x 2   . CHOLECYSTECTOMY N/A 12/31/2016   Procedure: LAPAROSCOPIC CHOLECYSTECTOMY WITH POSSIBLE INTRAOPERATIVE CHOLANGIOGRAM;  Surgeon: Rolm Bookbinder, MD;  Location: [facility];  Service: General;  Laterality: N/A;  . COLONOSCOPY    . HIATAL HERNIA REPAIR  2003  . Lymph node removal   2008  . MASTECTOMY MODIFIED RADICAL Left 01/26/2014   Procedure: LEFT MODIFIED RADICAL MASTECTOMY;  Surgeon: Jamesetta So, MD;  [facility];  Service: General;  Laterality: Left;  . PORT-A-CATH REMOVAL  2009 ish  . PORTA CATH INSERTION  [id number]  . TOTAL KNEE ARTHROPLASTY Right 2006  . TOTAL KNEE ARTHROPLASTY  08/12/2011   Procedure: TOTAL KNEE ARTHROPLASTY;  Surgeon: Arther Abbott, MD;  [facility];  Service: Orthopedics;  Laterality: Left;  . TUBAL LIGATION  1980   Medications: Reviewed & Updated - see associated section                       Current Outpatient Medications:  .  albuterol (PROVENTIL) (2.5 MG/3ML) 0.083% nebulizer solution, Inhale 3 mLs into the lungs every 4 (four) hours as needed., Disp: , Rfl:  .  apixaban (ELIQUIS) 5 MG TABS tablet, Take 1 tablet (5 mg total) by mouth 2 (two) times daily., Disp: 180 tablet, Rfl: 3 .  baclofen (LIORESAL) 20 MG tablet, Take 1 tablet  by mouth at bedtime as needed., Disp: , Rfl:  .  buPROPion (WELLBUTRIN XL) 300 MG 24 hr tablet, Take 300 mg by mouth daily., Disp: , Rfl:  .  Calcium Carbonate-Vit D-Min (CALCIUM 1200 PO), Take by mouth., Disp: , Rfl:  .  fexofenadine (ALLEGRA) 180 MG tablet, Take 180 mg by mouth daily., Disp: , Rfl:  .  furosemide (LASIX) 40 MG tablet, Take 1 tablet (40 mg total) by mouth 2 (two) times daily., Disp: 180 tablet, Rfl: 3 .  metoprolol tartrate (LOPRESSOR) 25 MG tablet, Take 1 tablet (25 mg total) by mouth 2 (two) times daily., Disp: 180 tablet, Rfl: 3 .  omeprazole (PRILOSEC) 20 MG capsule, Take 20 mg by mouth 2 (two) times daily. , Disp: , Rfl:  .  potassium chloride SA (KLOR-CON M20) 20 MEQ tablet, Take 2 tablets (40 mEq total) by mouth every morning. & 20 meq in the evening (Patient taking differently: Take 20 mEq by mouth 2 (two) times daily. & 20 meq in the evening), Disp: 180 tablet, Rfl: 3 .  rosuvastatin (CRESTOR) 20 MG tablet, Take 20 mg by mouth at bedtime. , Disp: , Rfl:  .  sertraline  (ZOLOFT) 100 MG tablet, Take 100 mg by mouth at bedtime., Disp: , Rfl:   Social History: Reviewed -  reports that she quit smoking about 20 years ago. Her smoking use included cigarettes. She has a 75.00 pack-year smoking history. She has never used smokeless tobacco.  Objective Findings:  Vitals: There were no vitals taken for this visit.  PHYSICAL EXAMINATION General appearance - alert, well appearing, and in no distress, oriented to person, place, and time and overweight Mental status - alert, oriented to person, place, and time, normal mood, behavior, speech, dress, motor activity, and thought processes, affect appropriate to mood  PELVIC Cervix - tiny, atrophic Pap done, long speculum required Uterus - modest amount of tissue in the uterus  Endometrial Biopsy: Patient given informed consent, signed copy in the chart, time out was performed. Time out taken. The patient was placed in the lithotomy position and the cervix brought into view with sterile speculum.  Portio of cervix cleansed x 2 with betadine swabs.  A tenaculum was placed in the anterior lip of the cervix. The uterus was sounded for depth of 6 cm. Milex uterine Explora 3 mm was introduced to into the uterus, suction created,  and an good endometrial sample was obtained. All equipment was removed and accounted for.   The patient tolerated the procedure well.   Patient given post procedure instructions.   Assessment & Plan:   A:  1. PMB,  2. Pap smear completed today 3. Endometrial Bx completed today  P:  1.  Schedule U/S at [facility] once bx results completed.  By signing my name below, I, De Burrs, attest that this documentation has been prepared under the direction and in the presence of Jonnie Kind, MD. Electronically Signed: De Burrs, Medical Scribe. 04/22/19. 11:04 AM.  I personally performed the services described in this documentation, which was SCRIBED in my presence. The recorded  information has been reviewed and considered accurate. It has been edited as necessary during review. Jonnie Kind, MD

## 2019-04-26 ENCOUNTER — Telehealth: Payer: Self-pay | Admitting: Obstetrics and Gynecology

## 2019-04-26 NOTE — Telephone Encounter (Signed)
Patient made aware of dx and referral plans to [facility]. Pt to expect a call within 48 hours, or call the office number.

## 2019-04-27 ENCOUNTER — Telehealth: Payer: Self-pay | Admitting: *Deleted

## 2019-04-27 LAB — CYTOLOGY - PAP
Comment: NEGATIVE
High risk HPV: NEGATIVE

## 2019-04-27 LAB — CERVICOVAGINAL ANCILLARY ONLY: HPV: NOT DETECTED

## 2019-04-27 NOTE — Telephone Encounter (Signed)
Pt wanted to see if Dr. Glo Herring could send in something for her pelvic cramping.

## 2019-04-28 ENCOUNTER — Encounter: Payer: Self-pay | Admitting: Obstetrics and Gynecology

## 2019-04-28 ENCOUNTER — Telehealth: Payer: Self-pay | Admitting: Obstetrics and Gynecology

## 2019-04-28 DIAGNOSIS — C541 Malignant neoplasm of endometrium: Secondary | ICD-10-CM | POA: Insufficient documentation

## 2019-04-28 MED ORDER — CIPROFLOXACIN HCL 500 MG PO TABS: 500.0000 mg | ORAL_TABLET | Freq: Two times a day (BID) | ORAL | 0 refills | Status: DC

## 2019-04-28 NOTE — Telephone Encounter (Signed)
Brixtyn is having some pelvic discomfort and cramping sensations now several days status post endometrial biopsy no fever and no unusual discharge.  She is still having some light bleeding.  I will cover with some antibiotics just in case there was some bacteria that got into the endometrial cavity during the biopsy despite our prep. Marda mentions that she has chronic diarrhea so this may be unrelated to the biopsy Laree reports that she is spoken with the cancer clinic and will hear from them tomorrow about her follow-up appointment

## 2019-04-29 ENCOUNTER — Telehealth: Payer: Self-pay | Admitting: *Deleted

## 2019-04-29 NOTE — Progress Notes (Signed)
GYNECOLOGIC ONCOLOGY NEW PATIENT CONSULTATION   Patient Name: Stephanie Jarvis  Patient Age: 68 y.o. Date of Service: 05/03/19 Referring Provider: [name], MD [place],  [place] [address]   Primary Care Provider: [name], MD Consulting Provider: Jeral Pinch, MD   Assessment/Plan:  Clinical stage 1 endometrial cancer.  We reviewed the nature of endometrial cancer and its recommended surgical staging, including total hysterectomy, bilateral salpingo-oophorectomy, and lymph node assessment. The patient is a suitable candidate for staging via a minimally invasive approach to surgery.  We reviewed that robotic assistance would be used to complete the surgery.   We discussed that most endometrial cancer is detected early and that decisions regarding adjuvant therapy will be made based on her final pathology.   We reviewed the sentinel lymph node technique. Risks and benefits of sentinel lymph node biopsy was reviewed. We reviewed the technique and ICG dye. The patient DOES NOT have an iodine allergy or known liver dysfunction. We reviewed the false negative rate (0.4%), and that 3% of patients with metastatic disease will not have it detected by SLN biopsy in endometrial cancer. A low risk of allergic reaction to the dye, <0.2% for ICG, has been reported. We also discussed that in the case of failed mapping, which occurs 40% of the time, a bilateral or unilateral lymphadenectomy will be performed at the surgeon's discretion.   Potential benefits of sentinel nodes including a higher detection rate for metastasis due to ultrastaging and potential reduction in operative morbidity. However, there remains uncertainty as to the role for treatment of micrometastatic disease. Further, the benefit of operative morbidity associated with the SLN technique in endometrial cancer is not yet completely known. In other patient populations (e.g. the cervical cancer population)  there has been observed reductions in morbidity with SLN biopsy compared to pelvic lymphadenectomy. Lymphedema, nerve dysfunction and lymphocysts are all potential risks with the SLN technique as with complete lymphadenectomy. Additional risks to the patient include the risk of damage to an internal organ while operating in an altered view (e.g. the black and white image of the robotic fluorescence imaging mode).   The patient was consented for a robotic assisted hysterectomy, bilateral salpingo-oophorectomy, sentinel lymph node evaluation, possible lymph node dissection, possible laparotomy. The risks of surgery were discussed in detail and she understands these to include infection; wound separation; hernia; vaginal cuff separation, injury to adjacent organs such as bowel, bladder, blood vessels, ureters and nerves; bleeding which may require blood transfusion; anesthesia risk; thromboembolic events; possible death; unforeseen complications; possible need for re-exploration; medical complications such as heart attack, stroke, pleural effusion and pneumonia; and, if full lymphadenectomy is performed the risk of lymphedema and lymphocyst. The patient will receive DVT and antibiotic prophylaxis as indicated. She voiced a clear understanding. She had the opportunity to ask questions. Perioperative instructions were reviewed with her. Prescriptions for post-op medications were sent to her pharmacy of choice.  Given limited exam today, we will plan to get a transvaginal ultrasound at [facility] prior to surgery.  I will reach out to the patient's cardiologist.  Given the reason for her anticoagulation, I think she can stop Eliquis 2 to 3 days before surgery without a Lovenox bridge.  Postoperatively, I would plan to have her use prophylactic dose of Lovenox on postoperative day #1 and 2 and to restart her Eliquis the evening of postoperative day 2 or morning of postoperative day #3.  Due to her OSA and  CPAP use,  I have asked her to bring her CPAP on the day of surgery.  She understands that the hope is this will be an outpatient surgery, but if she has any difficulty waking up from anesthesia or needs to spend the night, it would be best for her to have her CPAP available.  A copy of this note was sent to the patient's referring provider.   Stephanie Pinch, MD  Division of Gynecologic Oncology  Department of Obstetrics and Gynecology  [facility]  ___________________________________________  Chief Complaint: Chief Complaint  Patient presents with  . Endometrial cancer determined by uterine biopsy ([facility])    History of Present Illness:  Stephanie Jarvis is a 36 y.o. y.o. female who is seen in consultation at the request of Stephanie Fire, MD for an evaluation of newly diagnosed endometrial cancer.  She began having spotting in August which progressively worsened since that time. She is on Eliquis 62m BID in the setting of atrial fibrillation which she started in November 2019. She had been on Arimidex (hx of breast cancer) until 12/2018 and began bleeding shortly after stopping.  Notes associated cramping and pain.  She denies any vaginal discharge.  She has had decreased appetite over the last several weeks with an unintentional 7 pound weight loss.  She notes some nausea with her pain, denies any emesis.  She denies any change to her bowel function.  After her gallbladder surgery, she now has bowel movements shortly after eating.  She denies any urinary symptoms.  She denies any fevers or chills.  She was seen in late October and underwent endometrial biopsy.  After EMB, she was having cramping and continued light bleeding. She was treated with ciprofloxacin given continued cramping after the procedure.  She continues to have significant cramping and pelvic pain in the morning when she has bleeding.  This completely subsides in the afternoon and evening.  She has been  using Tylenol for the pain which helps some.  Her past medical history is notable for atrial fibrillation as well as seasonal allergies.  She uses a CPAP machine at night for obstructive sleep apnea.  She reports shortness of breath with ambulation related to seasonal allergies.  She uses her albuterol inhaler once or twice daily.  She can walk up the set of stairs at her house slowly although gets short of breath.  She denies any chest pain at rest or with ambulation.  PAST MEDICAL HISTORY:  Past Medical History:  Diagnosis Date  . Ankle pain   . Anxiety   . Arthritis   . [place] [place]   IL[facility] of Left breast; ER+  . 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 Endometrial cancer ([facility]   . GERD (gastroesophageal reflux disease)   . H/O hiatal hernia 2003  . History of atrial fibrillation 2011  . History of blood transfusion   . History of hepatitis A    Age 68 . History of pneumonia   . Hyperlipidemia   . Knee pain   . Obesity, Class III, BMI 40-49.9 (morbid obesity) ([facility]   . Sciatica   . Vertigo      PAST SURGICAL HISTORY:  Past Surgical History:  Procedure Laterality Date  . BREAST LUMPECTOMY Left 1982,2008   x 2   . CHOLECYSTECTOMY N/A 12/31/2016   Procedure: LAPAROSCOPIC CHOLECYSTECTOMY WITH POSSIBLE INTRAOPERATIVE CHOLANGIOGRAM;  Surgeon: WRolm Bookbinder MD;  [facility]  Service: General;  Laterality: N/A;  . COLONOSCOPY    . HIATAL HERNIA REPAIR  2003  . Lymph node removal  2008  . MASTECTOMY MODIFIED RADICAL Left 01/26/2014   Procedure: LEFT MODIFIED RADICAL MASTECTOMY;  Surgeon: Jamesetta So, MD;  Location: [facility];  Service: General;  Laterality: Left;  . PORT-A-CATH REMOVAL  2009 ish  . PORTA CATH INSERTION  [id number]  . TOTAL KNEE ARTHROPLASTY Right 2006  . TOTAL KNEE ARTHROPLASTY  08/12/2011   Procedure: TOTAL KNEE ARTHROPLASTY;  Surgeon: Arther Abbott, MD;  Location: [facility];  Service: Orthopedics;   Laterality: Left;  . TUBAL LIGATION  1980    OB/GYN HISTORY:  OB History  Gravida Para Term Preterm AB Living  _0 SAB TAB Ectopic Multiple Live Births          2    # Outcome Date GA Lbr Len/2nd Weight Sex Delivery Anes PTL Lv  2 Term     F Vag-Spont   LIV  1 Term     F Vag-Spont   DEC    No LMP recorded. Patient is postmenopausal.  Age at menarche: 62  Age at menopause: 25 Hx of HRT: yes - Arimidex Hx of STDs: no Last pap: 04/2019 History of abnormal pap smears: yes  SCREENING STUDIES:  Last mammogram: 10/2018  Last colonoscopy: 8-10 years ago Last bone mineral density: 03/2018  MEDICATIONS: Outpatient Encounter Medications as of 05/03/2019  Medication Sig  . albuterol (PROVENTIL) (2.5 MG/3ML) 0.083% nebulizer solution Inhale 3 mLs into the lungs every 4 (four) hours as needed.  . ALBUTEROL IN Inhale into the lungs as needed.  Marland Kitchen apixaban (ELIQUIS) 5 MG TABS tablet Take 1 tablet (5 mg total) by mouth 2 (two) times daily.  . baclofen (LIORESAL) 20 MG tablet Take 1 tablet by mouth at bedtime as needed.  Marland Kitchen buPROPion (WELLBUTRIN XL) 300 MG 24 hr tablet Take 300 mg by mouth daily.  . Calcium Carbonate-Vit D-Min (CALCIUM 1200 PO) Take by mouth.  . ciprofloxacin (CIPRO) 500 MG tablet Take 1 tablet (500 mg total) by mouth 2 (two) times daily.  . fexofenadine (ALLEGRA) 180 MG tablet Take 180 mg by mouth daily.  . furosemide (LASIX) 40 MG tablet Take 1 tablet (40 mg total) by mouth 2 (two) times daily.  . metoprolol tartrate (LOPRESSOR) 25 MG tablet Take 1 tablet (25 mg total) by mouth 2 (two) times daily.  Marland Kitchen omeprazole (PRILOSEC) 20 MG capsule Take 20 mg by mouth 2 (two) times daily.   . potassium chloride SA (KLOR-CON M20) 20 MEQ tablet Take 2 tablets (40 mEq total) by mouth every morning. & 20 meq in the evening (Patient taking differently: Take 20 mEq by mouth 2 (two) times daily. & 20 meq in the evening)  . rosuvastatin (CRESTOR) 20 MG tablet Take 20 mg by mouth at  bedtime.   . sertraline (ZOLOFT) 100 MG tablet Take 100 mg by mouth at bedtime.  Marland Kitchen ibuprofen (ADVIL) 600 MG tablet Take 1 tablet (600 mg total) by mouth every 6 (six) hours as needed for moderate pain. For AFTER surgery  . oxyCODONE (OXY IR/ROXICODONE) 5 MG immediate release tablet Take 1 tablet (5 mg total) by mouth every 4 (four) hours as needed for severe pain. For AFTER surgery only, do not take and drive  . senna-docusate (SENOKOT-S) 8.6-50 MG tablet Take 2 tablets by mouth at bedtime. For AFTER surgery, do not take if having diarrhea  No facility-administered encounter medications on file as of 05/03/2019.     ALLERGIES:  Allergies  Allergen Reactions  . No Known Allergies      FAMILY HISTORY:  Family History  Problem Relation Age of Onset  . Stroke Father   . Heart disease Father   . Skin cancer Sister        dx. 55-58; unknown type  . Throat cancer Maternal Uncle 75       smoker; voicebox removed  . Alzheimer's disease Paternal Grandmother   . Heart attack Paternal Grandfather   . Alzheimer's disease Maternal Aunt   . Heart disease Maternal Uncle   . Lung cancer Cousin        smoker  . Lung cancer Paternal Aunt        dx. mid-70s; worked at [employer]  . Heart attack Daughter        caused by thyroid issues  . Heart disease Other   . Arthritis Other   . Lung disease Other   . Cancer Other   . Kidney disease Other   . Colon cancer Other   . Ovarian cancer Other   . Endometrial cancer Other      SOCIAL HISTORY:  Relationships  Social connections  . Talks on phone: Not on file  . Gets together: Not on file  . Attends religious service: Not on file  . Active member of club or organization: Not on file  . Attends meetings of clubs or organizations: Not on file  . Relationship status: Not on file    REVIEW OF SYSTEMS:  Constitutional  Feels well, denies fatigue  ENT Normal appearing ears and nares bilaterally Skin/Breast  No rash, sores, jaundice,  itching, dryness Cardiovascular  No chest pain, + some LE edema, shortness of breath  Pulmonary  No cough or wheeze.  Gastro Intestinal  No vomitting or diarrhoea. Some nausea related to pain. No bright red blood per rectum, no abdominal pain, change in bowel movement, or constipation.  Genito Urinary  No frequency, urgency, dysuria Musculo Skeletal  No myalgia, arthralgia, joint swelling or pain. + back pain related to abdominal cramping.  Neurologic  No weakness, numbness, change in gait,  Psychology  No depression, anxiety, insomnia.   Physical Exam:  Vital Signs for this encounter:  Blood pressure (!) 152/80, pulse 78, temperature (!) 97.1 F (36.2 C), temperature source Temporal, resp. rate 20, height 5' 5" (1.651 m), weight 258 lb (117 kg), SpO2 97 %. Body mass index is 42.93 kg/m. General: Alert, oriented, no acute distress.  HEENT: Normocephalic, atraumatic. Sclera anicteric.  Chest: Clear to auscultation bilaterally.  Cardiovascular: Regular rate and rhythm, no murmurs, rubs, or gallops.  Abdomen: Obese. Normoactive bowel sounds. Soft, nondistended, nontender to palpation. No masses or hepatosplenomegaly appreciated. No palpable fluid wave.  Extremities: Grossly normal range of motion. Warm, well perfused. No edema bilaterally.  Skin: No rashes or lesions.  Lymphatics: No cervical, supraclavicular, or inguinal adenopathy.  GU:  Normal external female genitalia. No lesions. No discharge. Minimal older  appearing blood within the vaginal vault.             Bladder/urethra:  No lesions or masses, well supported bladder             Vagina: Mild atrophy, discomfort with initial placement of the speculum and on  bimanual exam.             Cervix: Normal appearing, posterior facing. Unable to palpate on bimanual  exam  given patient's discomfort.             Uterus: Unable to appreciate size due to discomfort and body habitus, no  parametrial involvement or nodularity.              Adnexa: No masses appreciated.  Rectal: no nodularity.  LABORATORY AND RADIOLOGIC DATA:  Pathology 04/22/19: EMB - endometrioid adenocarcinoma, FIGO gr 1 Pap test - AGC, HPV negative

## 2019-04-29 NOTE — H&P (View-Only) (Signed)
GYNECOLOGIC ONCOLOGY NEW PATIENT CONSULTATION   Patient Name: Stephanie Jarvis  Patient Age: 68 y.o. Date of Service: 05/03/19 Referring Provider: Fanta, Tesfaye, MD [address] [place],  [place] [address]   Primary Care Provider: Fanta, Tesfaye, MD Consulting Provider: Nathan Stallworth, MD   Assessment/Plan:  Clinical stage 1 endometrial cancer.  We reviewed the nature of endometrial cancer and its recommended surgical staging, including total hysterectomy, bilateral salpingo-oophorectomy, and lymph node assessment. The patient is a suitable candidate for staging via a minimally invasive approach to surgery.  We reviewed that robotic assistance would be used to complete the surgery.   We discussed that most endometrial cancer is detected early and that decisions regarding adjuvant therapy will be made based on her final pathology.   We reviewed the sentinel lymph node technique. Risks and benefits of sentinel lymph node biopsy was reviewed. We reviewed the technique and ICG dye. The patient DOES NOT have an iodine allergy or known liver dysfunction. We reviewed the false negative rate (0.4%), and that 3% of patients with metastatic disease will not have it detected by SLN biopsy in endometrial cancer. A low risk of allergic reaction to the dye, <0.2% for ICG, has been reported. We also discussed that in the case of failed mapping, which occurs 40% of the time, a bilateral or unilateral lymphadenectomy will be performed at the surgeon's discretion.   Potential benefits of sentinel nodes including a higher detection rate for metastasis due to ultrastaging and potential reduction in operative morbidity. However, there remains uncertainty as to the role for treatment of micrometastatic disease. Further, the benefit of operative morbidity associated with the SLN technique in endometrial cancer is not yet completely known. In other patient populations (e.g. the cervical cancer population)  there has been observed reductions in morbidity with SLN biopsy compared to pelvic lymphadenectomy. Lymphedema, nerve dysfunction and lymphocysts are all potential risks with the SLN technique as with complete lymphadenectomy. Additional risks to the patient include the risk of damage to an internal organ while operating in an altered view (e.g. the black and white image of the robotic fluorescence imaging mode).   The patient was consented for a robotic assisted hysterectomy, bilateral salpingo-oophorectomy, sentinel lymph node evaluation, possible lymph node dissection, possible laparotomy. The risks of surgery were discussed in detail and she understands these to include infection; wound separation; hernia; vaginal cuff separation, injury to adjacent organs such as bowel, bladder, blood vessels, ureters and nerves; bleeding which may require blood transfusion; anesthesia risk; thromboembolic events; possible death; unforeseen complications; possible need for re-exploration; medical complications such as heart attack, stroke, pleural effusion and pneumonia; and, if full lymphadenectomy is performed the risk of lymphedema and lymphocyst. The patient will receive DVT and antibiotic prophylaxis as indicated. She voiced a clear understanding. She had the opportunity to ask questions. Perioperative instructions were reviewed with her. Prescriptions for post-op medications were sent to her pharmacy of choice.  Given limited exam today, we will plan to get a transvaginal ultrasound at [facility] prior to surgery.  I will reach out to the patient's cardiologist.  Given the reason for her anticoagulation, I think she can stop Eliquis 2 to 3 days before surgery without a Lovenox bridge.  Postoperatively, I would plan to have her use prophylactic dose of Lovenox on postoperative day #1 and 2 and to restart her Eliquis the evening of postoperative day 2 or morning of postoperative day #3.  Due to her OSA and  CPAP use,   I have asked her to bring her CPAP on the day of surgery.  She understands that the hope is this will be an outpatient surgery, but if she has any difficulty waking up from anesthesia or needs to spend the night, it would be best for her to have her CPAP available.  A copy of this note was sent to the patient's referring provider.   Gloriann Riede, MD  Division of Gynecologic Oncology  Department of Obstetrics and Gynecology  [facility]  ___________________________________________  Chief Complaint: Chief Complaint  Patient presents with  . Endometrial cancer determined by uterine biopsy (HCC)    History of Present Illness:  Stephanie Jarvis is a 68 y.o. y.o. female who is seen in consultation at the request of Fanta, Tesfaye, MD for an evaluation of newly diagnosed endometrial cancer.  She began having spotting in August which progressively worsened since that time. She is on Eliquis 5mg BID in the setting of atrial fibrillation which she started in November 2019. She had been on Arimidex (hx of breast cancer) until 12/2018 and began bleeding shortly after stopping.  Notes associated cramping and pain.  She denies any vaginal discharge.  She has had decreased appetite over the last several weeks with an unintentional 7 pound weight loss.  She notes some nausea with her pain, denies any emesis.  She denies any change to her bowel function.  After her gallbladder surgery, she now has bowel movements shortly after eating.  She denies any urinary symptoms.  She denies any fevers or chills.  She was seen in late October and underwent endometrial biopsy.  After EMB, she was having cramping and continued light bleeding. She was treated with ciprofloxacin given continued cramping after the procedure.  She continues to have significant cramping and pelvic pain in the morning when she has bleeding.  This completely subsides in the afternoon and evening.  She has been  using Tylenol for the pain which helps some.  Her past medical history is notable for atrial fibrillation as well as seasonal allergies.  She uses a CPAP machine at night for obstructive sleep apnea.  She reports shortness of breath with ambulation related to seasonal allergies.  She uses her albuterol inhaler once or twice daily.  She can walk up the set of stairs at her house slowly although gets short of breath.  She denies any chest pain at rest or with ambulation.  PAST MEDICAL HISTORY:  Past Medical History:  Diagnosis Date  . Ankle pain   . Anxiety   . Arthritis   . Breast cancer (HCC) 2008   ILC of Left breast; ER+  . 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 Endometrial cancer (HCC)   . GERD (gastroesophageal reflux disease)   . H/O hiatal hernia 2003  . History of atrial fibrillation 2011  . History of blood transfusion   . History of hepatitis A    Age 16  . History of pneumonia   . Hyperlipidemia   . Knee pain   . Obesity, Class III, BMI 40-49.9 (morbid obesity) (HCC)   . Sciatica   . Vertigo      PAST SURGICAL HISTORY:  Past Surgical History:  Procedure Laterality Date  . BREAST LUMPECTOMY Left 1982,2008   x 2   . CHOLECYSTECTOMY N/A 12/31/2016   Procedure: LAPAROSCOPIC CHOLECYSTECTOMY WITH POSSIBLE INTRAOPERATIVE CHOLANGIOGRAM;  Surgeon: Wakefield, Matthew, MD;  Location: [facility] OR;    Service: General;  Laterality: N/A;  . COLONOSCOPY    . HIATAL HERNIA REPAIR  2003  . Lymph node removal  2008  . MASTECTOMY MODIFIED RADICAL Left 01/26/2014   Procedure: LEFT MODIFIED RADICAL MASTECTOMY;  Surgeon: Jamesetta So, MD;  Location: [facility] ORS;  Service: General;  Laterality: Left;  . PORT-A-CATH REMOVAL  2009 ish  . PORTA CATH INSERTION  [id number]  . TOTAL KNEE ARTHROPLASTY Right 2006  . TOTAL KNEE ARTHROPLASTY  08/12/2011   Procedure: TOTAL KNEE ARTHROPLASTY;  Surgeon: Arther Abbott, MD;  Location: [facility] ORS;  Service: Orthopedics;   Laterality: Left;  . TUBAL LIGATION  1980    OB/GYN HISTORY:  OB History  Gravida Para Term Preterm AB Living  _0 SAB TAB Ectopic Multiple Live Births          2    # Outcome Date GA Lbr Len/2nd Weight Sex Delivery Anes PTL Lv  2 Term     F Vag-Spont   LIV  1 Term     F Vag-Spont   DEC    No LMP recorded. Patient is postmenopausal.  Age at menarche: 68  Age at menopause: 31 Hx of HRT: yes - Arimidex Hx of STDs: no Last pap: 04/2019 History of abnormal pap smears: yes  SCREENING STUDIES:  Last mammogram: 10/2018  Last colonoscopy: 8-10 years ago Last bone mineral density: 03/2018  MEDICATIONS: Outpatient Encounter Medications as of 05/03/2019  Medication Sig  . albuterol (PROVENTIL) (2.5 MG/3ML) 0.083% nebulizer solution Inhale 3 mLs into the lungs every 4 (four) hours as needed.  . ALBUTEROL IN Inhale into the lungs as needed.  Marland Kitchen apixaban (ELIQUIS) 5 MG TABS tablet Take 1 tablet (5 mg total) by mouth 2 (two) times daily.  . baclofen (LIORESAL) 20 MG tablet Take 1 tablet by mouth at bedtime as needed.  Marland Kitchen buPROPion (WELLBUTRIN XL) 300 MG 24 hr tablet Take 300 mg by mouth daily.  . Calcium Carbonate-Vit D-Min (CALCIUM 1200 PO) Take by mouth.  . ciprofloxacin (CIPRO) 500 MG tablet Take 1 tablet (500 mg total) by mouth 2 (two) times daily.  . fexofenadine (ALLEGRA) 180 MG tablet Take 180 mg by mouth daily.  . furosemide (LASIX) 40 MG tablet Take 1 tablet (40 mg total) by mouth 2 (two) times daily.  . metoprolol tartrate (LOPRESSOR) 25 MG tablet Take 1 tablet (25 mg total) by mouth 2 (two) times daily.  Marland Kitchen omeprazole (PRILOSEC) 20 MG capsule Take 20 mg by mouth 2 (two) times daily.   . potassium chloride SA (KLOR-CON M20) 20 MEQ tablet Take 2 tablets (40 mEq total) by mouth every morning. & 20 meq in the evening (Patient taking differently: Take 20 mEq by mouth 2 (two) times daily. & 20 meq in the evening)  . rosuvastatin (CRESTOR) 20 MG tablet Take 20 mg by mouth at  bedtime.   . sertraline (ZOLOFT) 100 MG tablet Take 100 mg by mouth at bedtime.  Marland Kitchen ibuprofen (ADVIL) 600 MG tablet Take 1 tablet (600 mg total) by mouth every 6 (six) hours as needed for moderate pain. For AFTER surgery  . oxyCODONE (OXY IR/ROXICODONE) 5 MG immediate release tablet Take 1 tablet (5 mg total) by mouth every 4 (four) hours as needed for severe pain. For AFTER surgery only, do not take and drive  . senna-docusate (SENOKOT-S) 8.6-50 MG tablet Take 2 tablets by mouth at bedtime. For AFTER surgery, do not take if having diarrhea  No facility-administered encounter medications on file as of 05/03/2019.     ALLERGIES:  Allergies  Allergen Reactions  . No Known Allergies      FAMILY HISTORY:  Family History  Problem Relation Age of Onset  . Stroke Father   . Heart disease Father   . Skin cancer Sister        dx. 57-58; unknown type  . Throat cancer Maternal Uncle 75       smoker; voicebox removed  . Alzheimer's disease Paternal Grandmother   . Heart attack Paternal Grandfather   . Alzheimer's disease Maternal Aunt   . Heart disease Maternal Uncle   . Lung cancer Cousin        smoker  . Lung cancer Paternal Aunt        dx. mid-70s; worked at tobacco company  . Heart attack Daughter        caused by thyroid issues  . Heart disease Other   . Arthritis Other   . Lung disease Other   . Cancer Other   . Kidney disease Other   . Colon cancer Other   . Ovarian cancer Other   . Endometrial cancer Other      SOCIAL HISTORY:  Relationships  Social connections  . Talks on phone: Not on file  . Gets together: Not on file  . Attends religious service: Not on file  . Active member of club or organization: Not on file  . Attends meetings of clubs or organizations: Not on file  . Relationship status: Not on file    REVIEW OF SYSTEMS:  Constitutional  Feels well, denies fatigue  ENT Normal appearing ears and nares bilaterally Skin/Breast  No rash, sores, jaundice,  itching, dryness Cardiovascular  No chest pain, + some LE edema, shortness of breath  Pulmonary  No cough or wheeze.  Gastro Intestinal  No vomitting or diarrhoea. Some nausea related to pain. No bright red blood per rectum, no abdominal pain, change in bowel movement, or constipation.  Genito Urinary  No frequency, urgency, dysuria Musculo Skeletal  No myalgia, arthralgia, joint swelling or pain. + back pain related to abdominal cramping.  Neurologic  No weakness, numbness, change in gait,  Psychology  No depression, anxiety, insomnia.   Physical Exam:  Vital Signs for this encounter:  Blood pressure (!) 152/80, pulse 78, temperature (!) 97.1 F (36.2 C), temperature source Temporal, resp. rate 20, height 5' 5" (1.651 m), weight 258 lb (117 kg), SpO2 97 %. Body mass index is 42.93 kg/m. General: Alert, oriented, no acute distress.  HEENT: Normocephalic, atraumatic. Sclera anicteric.  Chest: Clear to auscultation bilaterally.  Cardiovascular: Regular rate and rhythm, no murmurs, rubs, or gallops.  Abdomen: Obese. Normoactive bowel sounds. Soft, nondistended, nontender to palpation. No masses or hepatosplenomegaly appreciated. No palpable fluid wave.  Extremities: Grossly normal range of motion. Warm, well perfused. No edema bilaterally.  Skin: No rashes or lesions.  Lymphatics: No cervical, supraclavicular, or inguinal adenopathy.  GU:  Normal external female genitalia. No lesions. No discharge. Minimal older  appearing blood within the vaginal vault.             Bladder/urethra:  No lesions or masses, well supported bladder             Vagina: Mild atrophy, discomfort with initial placement of the speculum and on  bimanual exam.             Cervix: Normal appearing, posterior facing. Unable to palpate on bimanual   exam  given patient's discomfort.             Uterus: Unable to appreciate size due to discomfort and body habitus, no  parametrial involvement or nodularity.              Adnexa: No masses appreciated.  Rectal: no nodularity.  LABORATORY AND RADIOLOGIC DATA:  Pathology 04/22/19: EMB - endometrioid adenocarcinoma, FIGO gr 1 Pap test - AGC, HPV negative

## 2019-04-29 NOTE — Telephone Encounter (Signed)
Called and scheduled the patient for a new patient appt. Gave the patient the policy for parking, visitors and masks

## 2019-05-03 ENCOUNTER — Encounter: Payer: Self-pay | Admitting: Gynecologic Oncology

## 2019-05-03 ENCOUNTER — Telehealth: Payer: Self-pay

## 2019-05-03 ENCOUNTER — Inpatient Hospital Stay: Payer: Medicare Other | Attending: Gynecologic Oncology | Admitting: Gynecologic Oncology

## 2019-05-03 ENCOUNTER — Other Ambulatory Visit: Payer: Self-pay | Admitting: Gynecologic Oncology

## 2019-05-03 ENCOUNTER — Other Ambulatory Visit: Payer: Self-pay

## 2019-05-03 VITALS — BP 152/80 | HR 78 | Temp 97.1°F | Resp 20 | Ht 65.0 in | Wt 258.0 lb

## 2019-05-03 DIAGNOSIS — R110 Nausea: Secondary | ICD-10-CM | POA: Diagnosis not present

## 2019-05-03 DIAGNOSIS — Z801 Family history of malignant neoplasm of trachea, bronchus and lung: Secondary | ICD-10-CM | POA: Diagnosis not present

## 2019-05-03 DIAGNOSIS — Z7901 Long term (current) use of anticoagulants: Secondary | ICD-10-CM | POA: Insufficient documentation

## 2019-05-03 DIAGNOSIS — Z79899 Other long term (current) drug therapy: Secondary | ICD-10-CM | POA: Diagnosis not present

## 2019-05-03 DIAGNOSIS — I4891 Unspecified atrial fibrillation: Secondary | ICD-10-CM | POA: Insufficient documentation

## 2019-05-03 DIAGNOSIS — G4733 Obstructive sleep apnea (adult) (pediatric): Secondary | ICD-10-CM | POA: Diagnosis not present

## 2019-05-03 DIAGNOSIS — Z6841 Body Mass Index (BMI) 40.0 and over, adult: Secondary | ICD-10-CM | POA: Diagnosis not present

## 2019-05-03 DIAGNOSIS — K219 Gastro-esophageal reflux disease without esophagitis: Secondary | ICD-10-CM | POA: Insufficient documentation

## 2019-05-03 DIAGNOSIS — Z853 Personal history of malignant neoplasm of breast: Secondary | ICD-10-CM | POA: Diagnosis not present

## 2019-05-03 DIAGNOSIS — E785 Hyperlipidemia, unspecified: Secondary | ICD-10-CM | POA: Diagnosis not present

## 2019-05-03 DIAGNOSIS — Z791 Long term (current) use of non-steroidal anti-inflammatories (NSAID): Secondary | ICD-10-CM | POA: Insufficient documentation

## 2019-05-03 DIAGNOSIS — C541 Malignant neoplasm of endometrium: Secondary | ICD-10-CM

## 2019-05-03 DIAGNOSIS — M1990 Unspecified osteoarthritis, unspecified site: Secondary | ICD-10-CM | POA: Diagnosis not present

## 2019-05-03 DIAGNOSIS — F419 Anxiety disorder, unspecified: Secondary | ICD-10-CM | POA: Insufficient documentation

## 2019-05-03 DIAGNOSIS — I890 Lymphedema, not elsewhere classified: Secondary | ICD-10-CM | POA: Diagnosis not present

## 2019-05-03 MED ORDER — SENNOSIDES-DOCUSATE SODIUM 8.6-50 MG PO TABS: 2.0000 | ORAL_TABLET | Freq: Every day | ORAL | 1 refills | Status: DC

## 2019-05-03 MED ORDER — OXYCODONE HCL 5 MG PO TABS: 5.0000 mg | ORAL_TABLET | ORAL | 0 refills | Status: DC | PRN

## 2019-05-03 MED ORDER — IBUPROFEN 600 MG PO TABS: 600.0000 mg | ORAL_TABLET | Freq: Four times a day (QID) | ORAL | 1 refills | Status: DC | PRN

## 2019-05-03 NOTE — Telephone Encounter (Signed)
Told Stephanie Jarvis that Dr. Berline Lopes wants her to hold the Eliquis 3 days prior to her surgery on 05-18-19. Her last dose of Eliquis would be on Friday 05-14-19 until after her surgery. Pt wrote down instructions and verbalized understanding.

## 2019-05-03 NOTE — Telephone Encounter (Signed)
Patient's last Hgb A1c was 5.7 on 03-31-18 with PCP Dr. Legrand Rams. Will send a copy to be scanned into EMR. [name] will order a Hgb A1c with Pre Op labs.

## 2019-05-03 NOTE — Patient Instructions (Signed)
Preparing for your Surgery  Plan for surgery on May 18, 2019 with Dr. Jeral Pinch at [facility] will be scheduled for a robotic assisted total laparoscopic hysterectomy, bilateral salpingo-oophorectomy, sentinel lymph node biopsy.   Pre-operative Testing -You will receive a phone call from presurgical testing at [facility] if you have not received a call already to arrange for a pre-operative testing appointment before your surgery.  This appointment normally occurs one to two weeks before your scheduled surgery.   -Bring your insurance card, copy of an advanced directive if applicable, medication list  -At that visit, you will be asked to sign a consent for a possible blood transfusion in case a transfusion becomes necessary during surgery.  The need for a blood transfusion is rare but having consent is a necessary part of your care.     -WE WILL REACH OUT TO YOUR CARDIOLOGIST FOR RECOMMENDATIONS ABOUT STOPPING YOUR ELIQUIS AND CONTACT YOU WITH THAT INFORMATION.  -As part of our enhanced surgical recovery pathway, you may be advised to drink a carbohydrate drink the morning of surgery (at least 3 hours before). If you are diabetic, this will be substituted with G2 gatorade in order to prevent elevated glucose levels prior to surgery.  -Do not take supplements such as fish oil (omega 3), red yeast rice, tumeric before your surgery.  Day Before Surgery at [facility] will be asked to take in a light diet the day before surgery.  Avoid carbonated beverages.  You will be advised to have nothing to eat or drink after midnight the evening before.    Eat a light diet the day before surgery.  Examples including soups, broths, toast, yogurt, mashed potatoes.  Things to avoid include carbonated beverages (fizzy beverages), raw fruits and raw vegetables, or beans.   If your bowels are filled with gas, your surgeon will have difficulty visualizing your pelvic organs which  increases your surgical risks.  Your role in recovery Your role is to become active as soon as directed by your doctor, while still giving yourself time to heal.  Rest when you feel tired. You will be asked to do the following in order to speed your recovery:  - Cough and breathe deeply. This helps toclear and expand your lungs and can prevent pneumonia.  - Do mild physical activity. Walking or moving your legs help your circulation and body functions return to normal. A staff member will help you when you try to walk and will provide you with simple exercises. Do not try to get up or walk alone the first time. - Actively manage your pain. Managing your pain lets you move in comfort. We will ask you to rate your pain on a scale of zero to 10. It is your responsibility to tell your doctor or nurse where and how much you hurt so your pain can be treated.  Special Considerations -If you are diabetic, you may be placed on insulin after surgery to have closer control over your blood sugars to promote healing and recovery.  This does not mean that you will be discharged on insulin.  If applicable, your oral antidiabetics will be resumed when you are tolerating a solid diet.  -Your final pathology results from surgery should be available around one week after surgery and the results will be relayed to you when available.  -Dr. Lahoma Crocker is the surgeon that assists your GYN Oncologist with surgery.  If you end up staying the night, the  next day after your surgery you will either see Dr. Denman George or Dr. Lahoma Crocker.  -FMLA forms can be faxed to [phone] and please allow 5-7 business days for completion.  Pain Management After Surgery -You have been prescribed your pain medication and bowel regimen medications before surgery so that you can have these available when you are discharged from the hospital. The pain medication is for use ONLY AFTER surgery and a new prescription will not be  given.   -Make sure that you have Tylenol and Ibuprofen at home to use on a regular basis after surgery for pain control. We recommend alternating the medications every hour to six hours since they work differently and are processed in the body differently for pain relief.  -Review the attached handout on narcotic use and their risks and side effects.   Bowel Regimen -You have been prescribed Sennakot-S to take nightly to prevent constipation especially if you are taking the narcotic pain medication intermittently.  It is important to prevent constipation and drink adequate amounts of liquids.  Blood Transfusion Information WHAT IS A BLOOD TRANSFUSION? A transfusion is the replacement of blood or some of its parts. Blood is made up of multiple cells which provide different functions.  Red blood cells carry oxygen and are used for blood loss replacement.  White blood cells fight against infection.  Platelets control bleeding.  Plasma helps clot blood.  Other blood products are available for specialized needs, such as hemophilia or other clotting disorders. BEFORE THE TRANSFUSION  Who gives blood for transfusions?   You may be able to donate blood to be used at a later date on yourself (autologous donation).  Relatives can be asked to donate blood. This is generally not any safer than if you have received blood from a stranger. The same precautions are taken to ensure safety when a relative's blood is donated.  Healthy volunteers who are fully evaluated to make sure their blood is safe. This is blood bank blood. Transfusion therapy is the safest it has ever been in the practice of medicine. Before blood is taken from a donor, a complete history is taken to make sure that person has no history of diseases nor engages in risky social behavior (examples are intravenous drug use or sexual activity with multiple partners). The donor's travel history is screened to minimize risk of transmitting  infections, such as malaria. The donated blood is tested for signs of infectious diseases, such as HIV and hepatitis. The blood is then tested to be sure it is compatible with you in order to minimize the chance of a transfusion reaction. If you or a relative donates blood, this is often done in anticipation of surgery and is not appropriate for emergency situations. It takes many days to process the donated blood. RISKS AND COMPLICATIONS Although transfusion therapy is very safe and saves many lives, the main dangers of transfusion include:   Getting an infectious disease.  Developing a transfusion reaction. This is an allergic reaction to something in the blood you were given. Every precaution is taken to prevent this. The decision to have a blood transfusion has been considered carefully by your caregiver before blood is given. Blood is not given unless the benefits outweigh the risks.  AFTER SURGERY INSTRUCTIONS 05/03/2019  Return to work: 4-6 weeks if applicable  Activity: 1. Be up and out of the bed during the day.  Take a nap if needed.  You may walk up steps but be careful and  use the hand rail.  Stair climbing will tire you more than you think, you may need to stop part way and rest.   2. No lifting or straining for 6 weeks.  3. No driving for 1 week(s).  Do not drive if you are taking narcotic pain medicine.  4. Shower daily.  Use soap and water on your incision and pat dry; don't rub.  No tub baths until cleared by your surgeon.   5. No sexual activity and nothing in the vagina for 8 weeks.  6. You may experience a small amount of clear drainage from your incisions, which is normal.  If the drainage persists or increases, please call the office.  7. You may experience vaginal spotting after surgery or around the 6-8 week mark from surgery when the stitches at the top of the vagina begin to dissolve.  The spotting is normal but if you experience heavy bleeding, call our office.   8. Take Tylenol or ibuprofen first for pain and only use narcotic pain medication for severe pain not relieved by the Tylenol or Ibuprofen.  Monitor your Tylenol intake to a max of 4,000 mg.  Diet: 1. Low sodium Heart Healthy Diet is recommended.  2. It is safe to use a laxative, such as Miralax or Colace, if you have difficulty moving your bowels. You can take Sennakot at bedtime every evening to keep bowel movements regular and to prevent constipation.    Wound Care: 1. Keep clean and dry.  Shower daily.  Reasons to call the Doctor:  Fever - Oral temperature greater than 100.4 degrees Fahrenheit  Foul-smelling vaginal discharge  Difficulty urinating  Nausea and vomiting  Increased pain at the site of the incision that is unrelieved with pain medicine.  Difficulty breathing with or without chest pain  New calf pain especially if only on one side  Sudden, continuing increased vaginal bleeding with or without clots.   Contacts: For questions or concerns you should contact:  Dr. Jeral Pinch at [phone]  Joylene John, NP at [phone]  After Hours: call [phone] and have the GYN Oncologist paged/contacted

## 2019-05-06 ENCOUNTER — Ambulatory Visit (HOSPITAL_COMMUNITY)
Admission: RE | Admit: 2019-05-06 | Discharge: 2019-05-06 | Disposition: A | Discharge status: Home / Self Care | Payer: Medicare Other | Source: Ambulatory Visit | Attending: Gynecologic Oncology | Admitting: Gynecologic Oncology

## 2019-05-06 ENCOUNTER — Other Ambulatory Visit: Payer: Self-pay

## 2019-05-06 ENCOUNTER — Ambulatory Visit (HOSPITAL_COMMUNITY): Admission: RE | Admit: 2019-05-06 | Payer: Medicare Other | Source: Ambulatory Visit

## 2019-05-06 DIAGNOSIS — C541 Malignant neoplasm of endometrium: Secondary | ICD-10-CM

## 2019-05-12 NOTE — Patient Instructions (Addendum)
DUE TO COVID-19 ONLY ONE VISITOR IS ALLOWED TO COME WITH YOU AND STAY IN THE WAITING ROOM ONLY DURING PRE OP AND PROCEDURE DAY OF SURGERY. THE 1 VISITOR MAY VISIT WITH YOU AFTER SURGERY IN YOUR PRIVATE ROOM DURING VISITING HOURS ONLY!  YOU NEED TO HAVE A COVID 19 TEST ON_______ @_______ , THIS TEST MUST BE DONE BEFORE SURGERY, COME  [place] [place] , [address].  ([place]) ONCE YOUR COVID TEST IS COMPLETED, PLEASE BEGIN THE QUARANTINE INSTRUCTIONS AS OUTLINED IN YOUR HANDOUT.                Stephanie Jarvis  05/12/2019   Your procedure is scheduled on: 05-18-19   Report to [facility] Main  Entrance   Report to admitting at        1015 AM     Call this number if you have problems the morning of surgery [phone]    Eat a light diet the day before surgery.  Examples including soups, broths, toast, yogurt, mashed potatoes.  Things to avoid include carbonated beverages (fizzy beverages), raw fruits and raw vegetables, or beans.   If your bowels are filled with gas, your surgeon will have difficulty visualizing your pelvic organs which increases your surgical risks.   Remember: NO SOLID FOOD AFTER MIDNIGHT THE NIGHT PRIOR TO SURGERY. NOTHING BY MOUTH EXCEPT CLEAR LIQUIDS UNTIL 1030 AM . PLEASE FINISH  G2   DRINK PER SURGEON ORDER  WHICH NEEDS TO BE COMPLETED AT        39 AM THEN NOTHING BY MOUTH .    CLEAR LIQUID DIET   Foods Allowed                                                                     Foods Excluded  Coffee and tea, regular and decaf                                         liquids that you cannot  Plain Jell-O any favor except red or purple                                           see through such as: Fruit ices (not with fruit pulp)                                                    milk, soups, orange juice  Iced Popsicles                                                  All solid food  Cranberry, grape and apple juices Sports drinks like Gatorade Lightly seasoned clear broth or consume(fat free) Sugar, honey syrup  _____________________________________________________________________     BRUSH YOUR TEETH MORNING OF SURGERY AND RINSE YOUR MOUTH OUT, NO CHEWING GUM CANDY OR MINTS.     Take these medicines the morning of surgery with A SIP OF WATER: omeprazole, metoprolol, allegra, wellbutrin, inhalers and bring with you                                 You may not have any metal on your body including hair pins and              piercings  Do not wear jewelry, make-up, lotions, powders or perfumes, deodorant             Do not wear nail polish on your fingernails.  Do not shave  48 hours prior to surgery.     Do not bring valuables to the hospital. [place].  Contacts, dentures or bridgework may not be worn into surgery.      Patients discharged the day of surgery will not be allowed to drive home. IF YOU ARE HAVING SURGERY AND GOING HOME THE SAME DAY, YOU MUST HAVE AN ADULT TO DRIVE YOU HOME AND BE WITH YOU FOR 24 HOURS. YOU MAY GO HOME BY TAXI OR UBER OR ORTHERWISE, BUT AN ADULT MUST ACCOMPANY YOU HOME AND STAY WITH YOU FOR 24 HOURS.  Name and phone number of your driver:  Special Instructions: N/A              Please read over the following fact sheets you were given: _____________________________________________________________________       [facility] - Preparing for Surgery Before surgery, you can play an important role.  Because skin is not sterile, your skin needs to be as free of germs as possible.  You can reduce the number of germs on your skin by washing with CHG (chlorahexidine gluconate) soap before surgery.  CHG is an antiseptic cleaner which kills germs and bonds with the skin to continue killing germs even after washing. Please DO NOT use if you have an allergy to CHG or antibacterial soaps.  If  your skin becomes reddened/irritated stop using the CHG and inform your nurse when you arrive at Short Stay. Do not shave (including legs and underarms) for at least 48 hours prior to the first CHG shower.  You may shave your face/neck. Please follow these instructions carefully:  1.  Shower with CHG Soap the night before surgery and the  morning of Surgery.  2.  If you choose to wash your hair, wash your hair first as usual with your  normal  shampoo.  3.  After you shampoo, rinse your hair and body thoroughly to remove the  shampoo.                           4.  Use CHG as you would any other liquid soap.  You can apply chg directly  to the skin and wash                       Gently with a scrungie or clean washcloth.  5.  Apply the CHG Soap to your  body ONLY FROM THE NECK DOWN.   Do not use on face/ open                           Wound or open sores. Avoid contact with eyes, ears mouth and genitals (private parts).                       Wash face,  Genitals (private parts) with your normal soap.             6.  Wash thoroughly, paying special attention to the area where your surgery  will be performed.  7.  Thoroughly rinse your body with warm water from the neck down.  8.  DO NOT shower/wash with your normal soap after using and rinsing off  the CHG Soap.                9.  Pat yourself dry with a clean towel.            10.  Wear clean pajamas.            11.  Place clean sheets on your bed the night of your first shower and do not  sleep with pets. Day of Surgery : Do not apply any lotions/deodorants the morning of surgery.  Please wear clean clothes to the hospital/surgery center.  FAILURE TO FOLLOW THESE INSTRUCTIONS MAY RESULT IN THE CANCELLATION OF YOUR SURGERY PATIENT SIGNATURE_________________________________  NURSE SIGNATURE__________________________________  ________________________________________________________________________   Stephanie Jarvis  An incentive  spirometer is a tool that can help keep your lungs clear and active. This tool measures how well you are filling your lungs with each breath. Taking long deep breaths may help reverse or decrease the chance of developing breathing (pulmonary) problems (especially infection) following:  A long period of time when you are unable to move or be active. BEFORE THE PROCEDURE   If the spirometer includes an indicator to show your best effort, your nurse or respiratory therapist will set it to a desired goal.  If possible, sit up straight or lean slightly forward. Try not to slouch.  Hold the incentive spirometer in an upright position. INSTRUCTIONS FOR USE  1. Sit on the edge of your bed if possible, or sit up as far as you can in bed or on a chair. 2. Hold the incentive spirometer in an upright position. 3. Breathe out normally. 4. Place the mouthpiece in your mouth and seal your lips tightly around it. 5. Breathe in slowly and as deeply as possible, raising the piston or the ball toward the top of the column. 6. Hold your breath for 3-5 seconds or for as long as possible. Allow the piston or ball to fall to the bottom of the column. 7. Remove the mouthpiece from your mouth and breathe out normally. 8. Rest for a few seconds and repeat Steps 1 through 7 at least 10 times every 1-2 hours when you are awake. Take your time and take a few normal breaths between deep breaths. 9. The spirometer may include an indicator to show your best effort. Use the indicator as a goal to work toward during each repetition. 10. After each set of 10 deep breaths, practice coughing to be sure your lungs are clear. If you have an incision (the cut made at the time of surgery), support your incision when coughing by placing a pillow or rolled up towels firmly against it.  Once you are able to get out of bed, walk around indoors and cough well. You may stop using the incentive spirometer when instructed by your caregiver.   RISKS AND COMPLICATIONS  Take your time so you do not get dizzy or light-headed.  If you are in pain, you may need to take or ask for pain medication before doing incentive spirometry. It is harder to take a deep breath if you are having pain. AFTER USE  Rest and breathe slowly and easily.  It can be helpful to keep track of a log of your progress. Your caregiver can provide you with a simple table to help with this. If you are using the spirometer at home, follow these instructions: [place] IF:   You are having difficultly using the spirometer.  You have trouble using the spirometer as often as instructed.  Your pain medication is not giving enough relief while using the spirometer.  You develop fever of 100.5 F (38.1 C) or higher. SEEK IMMEDIATE MEDICAL CARE IF:   You cough up bloody sputum that had not been present before.  You develop fever of 102 F (38.9 C) or greater.  You develop worsening pain at or near the incision site. MAKE SURE YOU:   Understand these instructions.  Will watch your condition.  Will get help right away if you are not doing well or get worse. Document Released: 10/28/2006 Document Revised: 09/09/2011 Document Reviewed: 12/29/2006 Exit[employer] Patient Information 2014 Exit[employer], [place].   ________________________________________________________________________  WHAT IS A BLOOD TRANSFUSION? Blood Transfusion Information  A transfusion is the replacement of blood or some of its parts. Blood is made up of multiple cells which provide different functions.  Red blood cells carry oxygen and are used for blood loss replacement.  White blood cells fight against infection.  Platelets control bleeding.  Plasma helps clot blood.  Other blood products are available for specialized needs, such as hemophilia or other clotting disorders. BEFORE THE TRANSFUSION  Who gives blood for transfusions?   Healthy volunteers who are fully evaluated  to make sure their blood is safe. This is blood bank blood. Transfusion therapy is the safest it has ever been in the practice of medicine. Before blood is taken from a donor, a complete history is taken to make sure that person has no history of diseases nor engages in risky social behavior (examples are intravenous drug use or sexual activity with multiple partners). The donor's travel history is screened to minimize risk of transmitting infections, such as malaria. The donated blood is tested for signs of infectious diseases, such as HIV and hepatitis. The blood is then tested to be sure it is compatible with you in order to minimize the chance of a transfusion reaction. If you or a relative donates blood, this is often done in anticipation of surgery and is not appropriate for emergency situations. It takes many days to process the donated blood. RISKS AND COMPLICATIONS Although transfusion therapy is very safe and saves many lives, the main dangers of transfusion include:   Getting an infectious disease.  Developing a transfusion reaction. This is an allergic reaction to something in the blood you were given. Every precaution is taken to prevent this. The decision to have a blood transfusion has been considered carefully by your caregiver before blood is given. Blood is not given unless the benefits outweigh the risks. AFTER THE TRANSFUSION  Right after receiving a blood transfusion, you will usually feel much better and more energetic.  This is especially true if your red blood cells have gotten low (anemic). The transfusion raises the level of the red blood cells which carry oxygen, and this usually causes an energy increase.  The nurse administering the transfusion will monitor you carefully for complications. HOME CARE INSTRUCTIONS  No special instructions are needed after a transfusion. You may find your energy is better. Speak with your caregiver about any limitations on activity for  underlying diseases you may have. SEEK MEDICAL CARE IF:   Your condition is not improving after your transfusion.  You develop redness or irritation at the intravenous (IV) site. SEEK IMMEDIATE MEDICAL CARE IF:  Any of the following symptoms occur over the next 12 hours:  Shaking chills.  You have a temperature by mouth above 102 F (38.9 C), not controlled by medicine.  Chest, back, or muscle pain.  People around you feel you are not acting correctly or are confused.  Shortness of breath or difficulty breathing.  Dizziness and fainting.  You get a rash or develop hives.  You have a decrease in urine output.  Your urine turns a dark color or changes to pink, red, or brown. Any of the following symptoms occur over the next 10 days:  You have a temperature by mouth above 102 F (38.9 C), not controlled by medicine.  Shortness of breath.  Weakness after normal activity.  The white part of the eye turns yellow (jaundice).  You have a decrease in the amount of urine or are urinating less often.  Your urine turns a dark color or changes to pink, red, or brown. Document Released: 06/14/2000 Document Revised: 09/09/2011 Document Reviewed: 02/01/2008 [facility] Patient Information 2014 [place], [place].  _______________________________________________________________________

## 2019-05-13 ENCOUNTER — Other Ambulatory Visit: Payer: Self-pay

## 2019-05-13 ENCOUNTER — Encounter (HOSPITAL_COMMUNITY): Payer: Self-pay

## 2019-05-13 ENCOUNTER — Encounter (HOSPITAL_COMMUNITY)
Admission: RE | Admit: 2019-05-13 | Discharge: 2019-05-13 | Disposition: A | Discharge status: Home / Self Care | Payer: Medicare Other | Source: Ambulatory Visit | Attending: Gynecologic Oncology | Admitting: Gynecologic Oncology

## 2019-05-13 DIAGNOSIS — Z7901 Long term (current) use of anticoagulants: Secondary | ICD-10-CM | POA: Insufficient documentation

## 2019-05-13 DIAGNOSIS — Z9049 Acquired absence of other specified parts of digestive tract: Secondary | ICD-10-CM | POA: Insufficient documentation

## 2019-05-13 DIAGNOSIS — Z87891 Personal history of nicotine dependence: Secondary | ICD-10-CM | POA: Insufficient documentation

## 2019-05-13 DIAGNOSIS — M1990 Unspecified osteoarthritis, unspecified site: Secondary | ICD-10-CM | POA: Diagnosis not present

## 2019-05-13 DIAGNOSIS — K219 Gastro-esophageal reflux disease without esophagitis: Secondary | ICD-10-CM | POA: Diagnosis not present

## 2019-05-13 DIAGNOSIS — G4730 Sleep apnea, unspecified: Secondary | ICD-10-CM | POA: Diagnosis not present

## 2019-05-13 DIAGNOSIS — C541 Malignant neoplasm of endometrium: Secondary | ICD-10-CM | POA: Insufficient documentation

## 2019-05-13 DIAGNOSIS — Z853 Personal history of malignant neoplasm of breast: Secondary | ICD-10-CM | POA: Diagnosis not present

## 2019-05-13 DIAGNOSIS — Z6841 Body Mass Index (BMI) 40.0 and over, adult: Secondary | ICD-10-CM | POA: Insufficient documentation

## 2019-05-13 DIAGNOSIS — J45909 Unspecified asthma, uncomplicated: Secondary | ICD-10-CM | POA: Diagnosis not present

## 2019-05-13 DIAGNOSIS — Z9012 Acquired absence of left breast and nipple: Secondary | ICD-10-CM | POA: Insufficient documentation

## 2019-05-13 DIAGNOSIS — F329 Major depressive disorder, single episode, unspecified: Secondary | ICD-10-CM | POA: Diagnosis not present

## 2019-05-13 DIAGNOSIS — F419 Anxiety disorder, unspecified: Secondary | ICD-10-CM | POA: Diagnosis not present

## 2019-05-13 DIAGNOSIS — Z01812 Encounter for preprocedural laboratory examination: Secondary | ICD-10-CM | POA: Diagnosis not present

## 2019-05-13 DIAGNOSIS — E785 Hyperlipidemia, unspecified: Secondary | ICD-10-CM | POA: Insufficient documentation

## 2019-05-13 DIAGNOSIS — I10 Essential (primary) hypertension: Secondary | ICD-10-CM | POA: Diagnosis not present

## 2019-05-13 DIAGNOSIS — I4891 Unspecified atrial fibrillation: Secondary | ICD-10-CM | POA: Insufficient documentation

## 2019-05-13 DIAGNOSIS — Z79899 Other long term (current) drug therapy: Secondary | ICD-10-CM | POA: Diagnosis not present

## 2019-05-13 DIAGNOSIS — Z96651 Presence of right artificial knee joint: Secondary | ICD-10-CM | POA: Diagnosis not present

## 2019-05-13 HISTORY — DX: Depression, unspecified: F32.A

## 2019-05-13 HISTORY — DX: Other diseases of pharynx: J39.2

## 2019-05-13 HISTORY — DX: Unspecified asthma, uncomplicated: J45.909

## 2019-05-13 LAB — CBC
HCT: 37.1 % (ref 36.0–46.0)
Hemoglobin: 12.2 g/dL (ref 12.0–15.0)
MCH: 30.8 pg (ref 26.0–34.0)
MCHC: 32.9 g/dL (ref 30.0–36.0)
MCV: 93.7 fL (ref 80.0–100.0)
Platelets: 283 10*3/uL (ref 150–400)
RBC: 3.96 MIL/uL (ref 3.87–5.11)
RDW: 13.8 % (ref 11.5–15.5)
WBC: 8.8 10*3/uL (ref 4.0–10.5)
nRBC: 0 % (ref 0.0–0.2)

## 2019-05-13 LAB — URINALYSIS, ROUTINE W REFLEX MICROSCOPIC
Bilirubin Urine: NEGATIVE
Glucose, UA: NEGATIVE mg/dL
Ketones, ur: NEGATIVE mg/dL
Nitrite: NEGATIVE
Protein, ur: NEGATIVE mg/dL
Specific Gravity, Urine: 1.009 (ref 1.005–1.030)
WBC, UA: >50 WBC/hpf — ABNORMAL HIGH (ref 0–5)
pH: 5 (ref 5.0–8.0)

## 2019-05-13 LAB — COMPREHENSIVE METABOLIC PANEL
ALT: 23 U/L (ref 0–44)
AST: 23 U/L (ref 15–41)
Albumin: 4.1 g/dL (ref 3.5–5.0)
Alkaline Phosphatase: 80 U/L (ref 38–126)
Anion gap: 12 (ref 5–15)
BUN: 10 mg/dL (ref 8–23)
CO2: 27 mmol/L (ref 22–32)
Calcium: 9.2 mg/dL (ref 8.9–10.3)
Chloride: 102 mmol/L (ref 98–111)
Creatinine, Ser: 0.75 mg/dL (ref 0.44–1.00)
GFR calc Af Amer: >60 mL/min (ref >60)
GFR calc non Af Amer: >60 mL/min (ref >60)
Glucose, Bld: 102 mg/dL — ABNORMAL HIGH (ref 70–99)
Potassium: 3.9 mmol/L (ref 3.5–5.1)
Sodium: 141 mmol/L (ref 135–145)
Total Bilirubin: 0.8 mg/dL (ref 0.3–1.2)
Total Protein: 7.4 g/dL (ref 6.5–8.1)

## 2019-05-13 LAB — HEMOGLOBIN A1C
Hgb A1c MFr Bld: 5.6 % (ref 4.8–5.6)
Mean Plasma Glucose: 114.02 mg/dL

## 2019-05-13 LAB — ABO/RH: ABO/RH(D): O POS

## 2019-05-13 NOTE — Progress Notes (Signed)
PCP - Tesfaye Fanta Cardiologist - Rozann Lesches  LOV 02-03-19 epic  Chest x-ray -  EKG - 02-03-19 epic  Poor R wave progression also seen on previous ekg in 2018 eoic Stress Test -  ECHO - 02-25-18 epic Cardiac Cath -   Sleep Study -  CPAP -   Fasting Blood Sugar -  Checks Blood Sugar _____ times a day  Blood Thinner Instructions:Elequis last dose 05-14-19 per Dr. Berline Lopes Hx of a fib Aspirin Instructions: Last Dose:  Anesthesia review:   Patient denies shortness of breath, fever, cough and chest pain at PAT appointment   NONE   Patient verbalized understanding of instructions that were given to them at the PAT appointment. Patient was also instructed that they will need to review over the PAT instructions again at home before surgery.

## 2019-05-14 ENCOUNTER — Other Ambulatory Visit (HOSPITAL_COMMUNITY)
Admission: RE | Admit: 2019-05-14 | Discharge: 2019-05-14 | Disposition: A | Discharge status: Home / Self Care | Payer: Medicare Other | Source: Ambulatory Visit | Attending: Gynecologic Oncology | Admitting: Gynecologic Oncology

## 2019-05-14 DIAGNOSIS — Z01812 Encounter for preprocedural laboratory examination: Secondary | ICD-10-CM | POA: Insufficient documentation

## 2019-05-14 DIAGNOSIS — Z20828 Contact with and (suspected) exposure to other viral communicable diseases: Secondary | ICD-10-CM | POA: Diagnosis not present

## 2019-05-14 LAB — SARS CORONAVIRUS 2 (TAT 6-24 HRS): SARS Coronavirus 2: NEGATIVE

## 2019-05-15 LAB — URINE CULTURE: Culture: 20000 — AB

## 2019-05-17 ENCOUNTER — Telehealth: Payer: Self-pay

## 2019-05-17 NOTE — Progress Notes (Signed)
Anesthesia Chart Review   Case: [id number] Date/Time: 05/18/19 1115   Procedures:      XI ROBOTIC ASSISTED TOTAL HYSTERECTOMY WITH BILATERAL SALPINGO OOPHORECTOMY (N/A )     SENTINEL LYMPH NODE BIOPSY (N/A )   Anesthesia type: General   Pre-op diagnosis: ENDOMETRIAL CANCER   Location: [facility]   Surgeon: Lafonda Mosses, MD      DISCUSSION:68 y.o. former smoker (1 pack years, quit 06/30/97), GERD, HLD, A-fib (on Eliquis), HTN, asthma, endometrial cancer scheduled for above procedure 05/18/2019 with Jeral Pinch.    Pt last seen by cardiologist, Dr. Rozann Lesches, 02/03/2019.  Stable at this visit with 6 month follow up recommended.  Advised to hold Eliquis 3 days prior to procedure.  VS: BP (!) 156/76 (BP Location: Right Arm)   Pulse 72   Temp 36.9 C (Oral)   Resp 18   Ht 5' 5"  (1.651 m)   Wt 119.7 kg   SpO2 96%   BMI 43.93 kg/m   PROVIDERS: [name], MD is PCP   Rozann Lesches, MD is Cardiologist  LABS: Labs reviewed: Acceptable for surgery. (all labs ordered are listed, but only abnormal results are displayed)  Labs Reviewed  COMPREHENSIVE METABOLIC PANEL - Abnormal; Notable for the following components:      Result Value   Glucose, Bld 102 (*)    All other components within normal limits  URINALYSIS, ROUTINE W REFLEX MICROSCOPIC - Abnormal; Notable for the following components:   APPearance HAZY (*)    Hgb urine dipstick LARGE (*)    Leukocytes,Ua LARGE (*)    WBC, UA >50 (*)    Bacteria, UA RARE (*)    All other components within normal limits  CBC  HEMOGLOBIN A1C  TYPE AND SCREEN  ABO/RH     IMAGES:   EKG: 02/03/2019 Rate 91 bpm Sinus rhythm with lead motion artifact Poor R wave progression   CV: Echo 02/25/2018 Study Conclusions  - Limited echo to evaluate pericardial effusion. - Left ventricle: The cavity size was normal. Wall thickness was   increased in a pattern of mild LVH. Systolic function was normal.   The  estimated ejection fraction was in the range of 60% to 65%.   Wall motion was normal; there were no regional wall motion   abnormalities. - Inferior vena cava: The vessel was normal in size. The   respirophasic diameter changes were in the normal range (= 50%),   consistent with normal central venous pressure. - Pericardium, extracardiac: There is a small to moderate   circumferential pericardial effusion measuring 1.3 cm in diastole   adjacent to the LV. There is no tamonade physiology by echo. - Stable, perhaps mildly decreased pericardial effusion compared to   prior study 10/28/2017. Past Medical History:  Diagnosis Date  . Ankle pain   . Anxiety   . Arthritis   . Asthma    Seasonal asthma  . Breast cancer ([place]) 2008   ILC of Left breast; ER+  . 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 Complication of anesthesia    Bad gag reflex  . Depression   . Dysrhythmia    A-fib  . Endometrial cancer ([place])   . GERD (gastroesophageal reflux disease)   . H/O hiatal hernia 2003  . History of atrial fibrillation 2011  . History of blood transfusion   . History of hepatitis A    Age 62  .  History of pneumonia   . Hyperactive gag reflex   . Hyperlipidemia   . Hypertension   . Knee pain   . Obesity, Class III, BMI 40-49.9 (morbid obesity) ([place])   . Pneumonia   . Sciatica   . Sleep apnea    cpap  . Vertigo     Past Surgical History:  Procedure Laterality Date  . BREAST LUMPECTOMY Left 1982,2008   x 2   . CHOLECYSTECTOMY N/A 12/31/2016   Procedure: LAPAROSCOPIC CHOLECYSTECTOMY WITH POSSIBLE INTRAOPERATIVE CHOLANGIOGRAM;  Surgeon: Rolm Bookbinder, MD;  Location: [facility];  Service: General;  Laterality: N/A;  . COLONOSCOPY    . EYE SURGERY     bil cataract surgery  . HIATAL HERNIA REPAIR  2003  . Lymph node removal  2008  . MASTECTOMY MODIFIED RADICAL Left 01/26/2014   Procedure: LEFT MODIFIED RADICAL MASTECTOMY;  Surgeon: Jamesetta So,  MD;  Location: [facility];  Service: General;  Laterality: Left;  . PORT-A-CATH REMOVAL  2009 ish  . PORTA CATH INSERTION  [id number]  . TOTAL KNEE ARTHROPLASTY Right 2006  . TOTAL KNEE ARTHROPLASTY  08/12/2011   Procedure: TOTAL KNEE ARTHROPLASTY;  Surgeon: Arther Abbott, MD;  Location: [facility];  Service: Orthopedics;  Laterality: Left;  . TUBAL LIGATION  1980    MEDICATIONS: . albuterol (PROVENTIL) (2.5 MG/3ML) 0.083% nebulizer solution  . albuterol (VENTOLIN HFA) 108 (90 Base) MCG/ACT inhaler  . apixaban (ELIQUIS) 5 MG TABS tablet  . baclofen (LIORESAL) 20 MG tablet  . buPROPion (WELLBUTRIN XL) 300 MG 24 hr tablet  . Calcium Carbonate-Vit D-Min (CALCIUM 1200 PO)  . ciprofloxacin (CIPRO) 500 MG tablet  . fexofenadine (ALLEGRA) 180 MG tablet  . furosemide (LASIX) 40 MG tablet  . ibuprofen (ADVIL) 600 MG tablet  . metoprolol tartrate (LOPRESSOR) 25 MG tablet  . omeprazole (PRILOSEC) 20 MG capsule  . oxyCODONE (OXY IR/ROXICODONE) 5 MG immediate release tablet  . potassium chloride SA (KLOR-CON M20) 20 MEQ tablet  . rosuvastatin (CRESTOR) 20 MG tablet  . senna-docusate (SENOKOT-S) 8.6-50 MG tablet  . sertraline (ZOLOFT) 100 MG tablet   No current facility-administered medications for this encounter.      Maia Plan [facility] Pre-Surgical Testing [phone] 05/17/19  12:57 PM

## 2019-05-17 NOTE — Telephone Encounter (Signed)
Stephanie Jarvis states that she is set for surgery  Tomorrow. She has no questions at this time.

## 2019-05-18 ENCOUNTER — Encounter (HOSPITAL_COMMUNITY)
Admission: RE | Disposition: A | Discharge status: Home / Self Care | Payer: Self-pay | Source: Other Acute Inpatient Hospital | Attending: Gynecologic Oncology

## 2019-05-18 ENCOUNTER — Ambulatory Visit (HOSPITAL_COMMUNITY): Payer: Medicare Other | Admitting: Physician Assistant

## 2019-05-18 ENCOUNTER — Ambulatory Visit (HOSPITAL_COMMUNITY)
Admission: RE | Admit: 2019-05-18 | Discharge: 2019-05-18 | Disposition: A | Discharge status: Home / Self Care | Payer: Medicare Other | Source: Other Acute Inpatient Hospital | Attending: Gynecologic Oncology | Admitting: Gynecologic Oncology

## 2019-05-18 ENCOUNTER — Encounter (HOSPITAL_COMMUNITY): Payer: Self-pay | Admitting: Gynecologic Oncology

## 2019-05-18 ENCOUNTER — Ambulatory Visit (HOSPITAL_COMMUNITY): Payer: Medicare Other | Admitting: Certified Registered Nurse Anesthetist

## 2019-05-18 DIAGNOSIS — K219 Gastro-esophageal reflux disease without esophagitis: Secondary | ICD-10-CM | POA: Diagnosis not present

## 2019-05-18 DIAGNOSIS — C775 Secondary and unspecified malignant neoplasm of intrapelvic lymph nodes: Secondary | ICD-10-CM | POA: Insufficient documentation

## 2019-05-18 DIAGNOSIS — J45909 Unspecified asthma, uncomplicated: Secondary | ICD-10-CM | POA: Diagnosis not present

## 2019-05-18 DIAGNOSIS — I4891 Unspecified atrial fibrillation: Secondary | ICD-10-CM | POA: Insufficient documentation

## 2019-05-18 DIAGNOSIS — I10 Essential (primary) hypertension: Secondary | ICD-10-CM | POA: Diagnosis not present

## 2019-05-18 DIAGNOSIS — M179 Osteoarthritis of knee, unspecified: Secondary | ICD-10-CM | POA: Diagnosis not present

## 2019-05-18 DIAGNOSIS — Z7901 Long term (current) use of anticoagulants: Secondary | ICD-10-CM | POA: Diagnosis not present

## 2019-05-18 DIAGNOSIS — F419 Anxiety disorder, unspecified: Secondary | ICD-10-CM | POA: Diagnosis not present

## 2019-05-18 DIAGNOSIS — G4730 Sleep apnea, unspecified: Secondary | ICD-10-CM | POA: Diagnosis not present

## 2019-05-18 DIAGNOSIS — Z87891 Personal history of nicotine dependence: Secondary | ICD-10-CM | POA: Diagnosis not present

## 2019-05-18 DIAGNOSIS — C541 Malignant neoplasm of endometrium: Secondary | ICD-10-CM | POA: Diagnosis not present

## 2019-05-18 DIAGNOSIS — Z853 Personal history of malignant neoplasm of breast: Secondary | ICD-10-CM | POA: Insufficient documentation

## 2019-05-18 DIAGNOSIS — G4733 Obstructive sleep apnea (adult) (pediatric): Secondary | ICD-10-CM | POA: Diagnosis not present

## 2019-05-18 DIAGNOSIS — Z96653 Presence of artificial knee joint, bilateral: Secondary | ICD-10-CM | POA: Diagnosis not present

## 2019-05-18 DIAGNOSIS — Z6841 Body Mass Index (BMI) 40.0 and over, adult: Secondary | ICD-10-CM | POA: Diagnosis not present

## 2019-05-18 DIAGNOSIS — Z79899 Other long term (current) drug therapy: Secondary | ICD-10-CM | POA: Diagnosis not present

## 2019-05-18 DIAGNOSIS — K660 Peritoneal adhesions (postprocedural) (postinfection): Secondary | ICD-10-CM | POA: Diagnosis not present

## 2019-05-18 DIAGNOSIS — E785 Hyperlipidemia, unspecified: Secondary | ICD-10-CM | POA: Insufficient documentation

## 2019-05-18 DIAGNOSIS — F329 Major depressive disorder, single episode, unspecified: Secondary | ICD-10-CM | POA: Diagnosis not present

## 2019-05-18 HISTORY — PX: ROBOTIC ASSISTED TOTAL HYSTERECTOMY WITH BILATERAL SALPINGO OOPHERECTOMY: SHX6086

## 2019-05-18 HISTORY — PX: SENTINEL NODE BIOPSY: SHX6608

## 2019-05-18 LAB — TYPE AND SCREEN
ABO/RH(D): O POS
Antibody Screen: NEGATIVE

## 2019-05-18 SURGERY — HYSTERECTOMY, TOTAL, ROBOT-ASSISTED, LAPAROSCOPIC, WITH BILATERAL SALPINGO-OOPHORECTOMY
Anesthesia: General

## 2019-05-18 MED ORDER — PHENYLEPHRINE 40 MCG/ML (10ML) SYRINGE FOR IV PUSH (FOR BLOOD PRESSURE SUPPORT)
PREFILLED_SYRINGE | INTRAVENOUS | Status: DC | PRN
  Administered 2019-05-18: 120 ug via INTRAVENOUS
  Administered 2019-05-18: 80 ug via INTRAVENOUS

## 2019-05-18 MED ORDER — ROCURONIUM BROMIDE 10 MG/ML (PF) SYRINGE
PREFILLED_SYRINGE | INTRAVENOUS | Status: AC
  Filled 2019-05-18: qty 10

## 2019-05-18 MED ORDER — LIDOCAINE 2% (20 MG/ML) 5 ML SYRINGE
INTRAMUSCULAR | Status: DC | PRN
  Administered 2019-05-18: 60 mg via INTRAVENOUS

## 2019-05-18 MED ORDER — FENTANYL CITRATE (PF) 100 MCG/2ML IJ SOLN
INTRAMUSCULAR | Status: DC | PRN
  Administered 2019-05-18 (×3): 50 ug via INTRAVENOUS

## 2019-05-18 MED ORDER — CEFAZOLIN SODIUM-DEXTROSE 2-4 GM/100ML-% IV SOLN
2.0000 g | INTRAVENOUS | Status: AC
  Administered 2019-05-18: 2 g via INTRAVENOUS
  Filled 2019-05-18: qty 100

## 2019-05-18 MED ORDER — SODIUM CHLORIDE 0.9% FLUSH: 3.0000 mL | INTRAVENOUS | Status: DC | PRN

## 2019-05-18 MED ORDER — LACTATED RINGERS IR SOLN
Status: DC | PRN
  Administered 2019-05-18: 1000 mL

## 2019-05-18 MED ORDER — HYDROMORPHONE HCL 1 MG/ML IJ SOLN
0.2500 mg | INTRAMUSCULAR | Status: DC | PRN
  Administered 2019-05-18: 0.5 mg via INTRAVENOUS

## 2019-05-18 MED ORDER — PROPOFOL 10 MG/ML IV BOLUS
INTRAVENOUS | Status: AC
  Filled 2019-05-18: qty 20

## 2019-05-18 MED ORDER — MORPHINE SULFATE (PF) 4 MG/ML IV SOLN: 2.0000 mg | INTRAVENOUS | Status: DC | PRN

## 2019-05-18 MED ORDER — EPHEDRINE SULFATE-NACL 50-0.9 MG/10ML-% IV SOSY
PREFILLED_SYRINGE | INTRAVENOUS | Status: DC | PRN
  Administered 2019-05-18 (×2): 10 mg via INTRAVENOUS

## 2019-05-18 MED ORDER — LACTATED RINGERS IV SOLN
INTRAVENOUS | Status: DC
  Administered 2019-05-18 (×2): via INTRAVENOUS

## 2019-05-18 MED ORDER — FENTANYL CITRATE (PF) 100 MCG/2ML IJ SOLN
INTRAMUSCULAR | Status: AC
  Filled 2019-05-18: qty 2

## 2019-05-18 MED ORDER — SUGAMMADEX SODIUM 200 MG/2ML IV SOLN
INTRAVENOUS | Status: DC | PRN
  Administered 2019-05-18: 400 mg via INTRAVENOUS

## 2019-05-18 MED ORDER — STERILE WATER FOR INJECTION IJ SOLN
INTRAMUSCULAR | Status: AC
  Filled 2019-05-18: qty 10

## 2019-05-18 MED ORDER — SODIUM CHLORIDE 0.9 % IV SOLN: 250.0000 mL | INTRAVENOUS | Status: DC | PRN

## 2019-05-18 MED ORDER — SCOPOLAMINE 1 MG/3DAYS TD PT72
1.0000 | MEDICATED_PATCH | TRANSDERMAL | Status: DC
  Administered 2019-05-18: 10:00:00 1.5 mg via TRANSDERMAL
  Filled 2019-05-18: qty 1

## 2019-05-18 MED ORDER — OXYCODONE HCL 5 MG PO TABS: 5.0000 mg | ORAL_TABLET | ORAL | Status: DC | PRN

## 2019-05-18 MED ORDER — DEXAMETHASONE SODIUM PHOSPHATE 10 MG/ML IJ SOLN
INTRAMUSCULAR | Status: AC
  Filled 2019-05-18: qty 1

## 2019-05-18 MED ORDER — ACETAMINOPHEN 500 MG PO TABS
1000.0000 mg | ORAL_TABLET | ORAL | Status: AC
  Administered 2019-05-18: 10:00:00 1000 mg via ORAL
  Filled 2019-05-18: qty 2

## 2019-05-18 MED ORDER — HEPARIN SODIUM (PORCINE) 5000 UNIT/ML IJ SOLN
5000.0000 [IU] | INTRAMUSCULAR | Status: AC
  Administered 2019-05-18: 10:00:00 5000 [IU] via SUBCUTANEOUS
  Filled 2019-05-18: qty 1

## 2019-05-18 MED ORDER — HYDROMORPHONE HCL 1 MG/ML IJ SOLN
INTRAMUSCULAR | Status: AC
  Filled 2019-05-18: qty 1

## 2019-05-18 MED ORDER — BUPIVACAINE HCL 0.25 % IJ SOLN
INTRAMUSCULAR | Status: DC | PRN
  Administered 2019-05-18: 27 mL

## 2019-05-18 MED ORDER — STERILE WATER FOR INJECTION IJ SOLN
INTRAMUSCULAR | Status: DC | PRN
  Administered 2019-05-18: 4 mL

## 2019-05-18 MED ORDER — SODIUM CHLORIDE 0.9% FLUSH: 3.0000 mL | Freq: Two times a day (BID) | INTRAVENOUS | Status: DC

## 2019-05-18 MED ORDER — ONDANSETRON HCL 4 MG/2ML IJ SOLN
INTRAMUSCULAR | Status: DC | PRN
  Administered 2019-05-18: 4 mg via INTRAVENOUS

## 2019-05-18 MED ORDER — ONDANSETRON HCL 4 MG/2ML IJ SOLN
INTRAMUSCULAR | Status: AC
  Filled 2019-05-18: qty 2

## 2019-05-18 MED ORDER — ROCURONIUM BROMIDE 10 MG/ML (PF) SYRINGE
PREFILLED_SYRINGE | INTRAVENOUS | Status: DC | PRN
  Administered 2019-05-18 (×3): 30 mg via INTRAVENOUS
  Administered 2019-05-18: 10 mg via INTRAVENOUS

## 2019-05-18 MED ORDER — EPHEDRINE 5 MG/ML INJ
INTRAVENOUS | Status: AC
  Filled 2019-05-18: qty 10

## 2019-05-18 MED ORDER — MEPERIDINE HCL 50 MG/ML IJ SOLN: 6.2500 mg | INTRAMUSCULAR | Status: DC | PRN

## 2019-05-18 MED ORDER — DEXAMETHASONE SODIUM PHOSPHATE 4 MG/ML IJ SOLN
4.0000 mg | INTRAMUSCULAR | Status: AC
  Administered 2019-05-18: 5 mg via INTRAVENOUS

## 2019-05-18 MED ORDER — LIDOCAINE 2% (20 MG/ML) 5 ML SYRINGE: INTRAMUSCULAR | Status: DC | PRN

## 2019-05-18 MED ORDER — PROPOFOL 10 MG/ML IV BOLUS
INTRAVENOUS | Status: DC | PRN
  Administered 2019-05-18: 170 mg via INTRAVENOUS

## 2019-05-18 MED ORDER — BUPIVACAINE HCL (PF) 0.25 % IJ SOLN
INTRAMUSCULAR | Status: AC
  Filled 2019-05-18: qty 30

## 2019-05-18 MED ORDER — GABAPENTIN 300 MG PO CAPS
300.0000 mg | ORAL_CAPSULE | ORAL | Status: AC
  Administered 2019-05-18: 300 mg via ORAL
  Filled 2019-05-18: qty 1

## 2019-05-18 MED ORDER — ACETAMINOPHEN 650 MG RE SUPP
650.0000 mg | RECTAL | Status: DC | PRN
  Filled 2019-05-18: qty 1

## 2019-05-18 MED ORDER — ACETAMINOPHEN 325 MG PO TABS: 650.0000 mg | ORAL_TABLET | ORAL | Status: DC | PRN

## 2019-05-18 MED ORDER — INDOCYANINE GREEN 25 MG IV SOLR
INTRAVENOUS | Status: DC | PRN
  Administered 2019-05-18: 2.5 mg

## 2019-05-18 MED ORDER — PROMETHAZINE HCL 25 MG/ML IJ SOLN: 6.2500 mg | INTRAMUSCULAR | Status: DC | PRN

## 2019-05-18 MED ORDER — LIDOCAINE 2% (20 MG/ML) 5 ML SYRINGE
INTRAMUSCULAR | Status: AC
  Filled 2019-05-18: qty 5

## 2019-05-18 MED ORDER — PHENYLEPHRINE 40 MCG/ML (10ML) SYRINGE FOR IV PUSH (FOR BLOOD PRESSURE SUPPORT)
PREFILLED_SYRINGE | INTRAVENOUS | Status: AC
  Filled 2019-05-18: qty 10

## 2019-05-18 SURGICAL SUPPLY — 65 items
APPLICATOR SURGIFLO ENDO (HEMOSTASIS) IMPLANT
BAG LAPAROSCOPIC 12 15 PORT 16 (BASKET) IMPLANT
BAG RETRIEVAL 12/15 (BASKET)
BAG RETRIEVAL 12/15MM (BASKET)
BLADE SURG SZ10 CARB STEEL (BLADE) IMPLANT
COVER BACK TABLE 60X90IN (DRAPES) ×3 IMPLANT
COVER TIP SHEARS 8 DVNC (MISCELLANEOUS) ×1 IMPLANT
COVER TIP SHEARS 8MM DA VINCI (MISCELLANEOUS) ×2
COVER WAND RF STERILE (DRAPES) IMPLANT
DECANTER SPIKE VIAL GLASS SM (MISCELLANEOUS) ×3 IMPLANT
DERMABOND ADVANCED (GAUZE/BANDAGES/DRESSINGS) ×2
DERMABOND ADVANCED .7 DNX12 (GAUZE/BANDAGES/DRESSINGS) ×1 IMPLANT
DRAPE ARM DVNC X/XI (DISPOSABLE) ×4 IMPLANT
DRAPE COLUMN DVNC XI (DISPOSABLE) ×1 IMPLANT
DRAPE DA VINCI XI ARM (DISPOSABLE) ×8
DRAPE DA VINCI XI COLUMN (DISPOSABLE) ×2
DRAPE SHEET LG 3/4 BI-LAMINATE (DRAPES) ×3 IMPLANT
DRAPE SURG IRRIG POUCH 19X23 (DRAPES) ×3 IMPLANT
DRSG OPSITE POSTOP 4X6 (GAUZE/BANDAGES/DRESSINGS) IMPLANT
DRSG OPSITE POSTOP 4X8 (GAUZE/BANDAGES/DRESSINGS) IMPLANT
ELECT REM PT RETURN 15FT ADLT (MISCELLANEOUS) ×3 IMPLANT
GLOVE BIO SURGEON STRL SZ 6 (GLOVE) ×12 IMPLANT
GLOVE BIO SURGEON STRL SZ 6.5 (GLOVE) ×4 IMPLANT
GLOVE BIO SURGEONS STRL SZ 6.5 (GLOVE) ×2
GOWN STRL REUS W/ TWL LRG LVL3 (GOWN DISPOSABLE) ×4 IMPLANT
GOWN STRL REUS W/TWL LRG LVL3 (GOWN DISPOSABLE) ×8
HOLDER FOLEY CATH W/STRAP (MISCELLANEOUS) IMPLANT
IRRIG SUCT STRYKERFLOW 2 WTIP (MISCELLANEOUS) ×3
IRRIGATION SUCT STRKRFLW 2 WTP (MISCELLANEOUS) ×1 IMPLANT
KIT PROCEDURE DA VINCI SI (MISCELLANEOUS) ×2
KIT PROCEDURE DVNC SI (MISCELLANEOUS) ×1 IMPLANT
KIT TURNOVER KIT A (KITS) ×3 IMPLANT
MANIPULATOR UTERINE 4.5 ZUMI (MISCELLANEOUS) ×3 IMPLANT
NEEDLE HYPO 22GX1.5 SAFETY (NEEDLE) ×3 IMPLANT
NEEDLE SPNL 18GX3.5 QUINCKE PK (NEEDLE) ×3 IMPLANT
OBTURATOR OPTICAL STANDARD 8MM (TROCAR) ×2
OBTURATOR OPTICAL STND 8 DVNC (TROCAR) ×1
OBTURATOR OPTICALSTD 8 DVNC (TROCAR) ×1 IMPLANT
PACK ROBOT GYN CUSTOM WL (TRAY / TRAY PROCEDURE) ×3 IMPLANT
PAD POSITIONING PINK XL (MISCELLANEOUS) ×3 IMPLANT
PORT ACCESS TROCAR AIRSEAL 12 (TROCAR) ×1 IMPLANT
PORT ACCESS TROCAR AIRSEAL 5M (TROCAR) ×2
POUCH SPECIMEN RETRIEVAL 10MM (ENDOMECHANICALS) IMPLANT
SEAL CANN UNIV 5-8 DVNC XI (MISCELLANEOUS) ×3 IMPLANT
SEAL XI 5MM-8MM UNIVERSAL (MISCELLANEOUS) ×6
SET TRI-LUMEN FLTR TB AIRSEAL (TUBING) ×3 IMPLANT
SPONGE LAP 18X18 X RAY DECT (DISPOSABLE) IMPLANT
SURGIFLO W/THROMBIN 8M KIT (HEMOSTASIS) IMPLANT
SUT MNCRL AB 4-0 PS2 18 (SUTURE) IMPLANT
SUT PDS AB 1 TP1 96 (SUTURE) IMPLANT
SUT VIC AB 0 CT1 27 (SUTURE)
SUT VIC AB 0 CT1 27XBRD ANTBC (SUTURE) IMPLANT
SUT VIC AB 2-0 CT1 27 (SUTURE)
SUT VIC AB 2-0 CT1 TAPERPNT 27 (SUTURE) IMPLANT
SUT VICRYL 4-0 PS2 18IN ABS (SUTURE) ×6 IMPLANT
SYR 10ML LL (SYRINGE) ×3 IMPLANT
TOWEL OR NON WOVEN STRL DISP B (DISPOSABLE) ×3 IMPLANT
TRAP SPECIMEN MUCOUS 40CC (MISCELLANEOUS) IMPLANT
TRAY FOL W/BAG SLVR 16FR STRL (SET/KITS/TRAYS/PACK) ×1 IMPLANT
TRAY FOLEY MTR SLVR 16FR STAT (SET/KITS/TRAYS/PACK) IMPLANT
TRAY FOLEY W/BAG SLVR 16FR LF (SET/KITS/TRAYS/PACK) ×2
TROCAR XCEL NON-BLD 5MMX100MML (ENDOMECHANICALS) IMPLANT
UNDERPAD 30X36 HEAVY ABSORB (UNDERPADS AND DIAPERS) ×3 IMPLANT
WATER STERILE IRR 1000ML POUR (IV SOLUTION) ×3 IMPLANT
YANKAUER SUCT BULB TIP 10FT TU (MISCELLANEOUS) IMPLANT

## 2019-05-18 NOTE — Interval H&P Note (Signed)
History and Physical Interval Note:  05/18/2019 9:48 AM  Stephanie Jarvis  has presented today for surgery, with the diagnosis of ENDOMETRIAL CANCER.  The various methods of treatment have been discussed with the patient and family. After consideration of risks, benefits and other options for treatment, the patient has consented to  Procedure(s): XI ROBOTIC ASSISTED TOTAL HYSTERECTOMY WITH BILATERAL SALPINGO OOPHORECTOMY (N/A) SENTINEL LYMPH NODE BIOPSY (N/A) as a surgical intervention.  The patient's history has been reviewed, patient examined, no change in status, stable for surgery.  I have reviewed the patient's chart and labs.  Questions were answered to the patient's satisfaction.     Lafonda Mosses

## 2019-05-18 NOTE — Transfer of Care (Signed)
Immediate Anesthesia Transfer of Care Note  Patient: Stephanie Jarvis  Procedure(s) Performed: XI ROBOTIC ASSISTED TOTAL HYSTERECTOMY WITH BILATERAL SALPINGO OOPHORECTOMY (N/A ) SENTINEL LYMPH NODE BIOPSY (N/A )  Patient Location: PACU  Anesthesia Type:General  Level of Consciousness: awake and patient cooperative  Airway & Oxygen Therapy: Patient Spontanous Breathing and Patient connected to face mask  Post-op Assessment: Report given to RN and Post -op Vital signs reviewed and stable  Post vital signs: Reviewed and stable  Last Vitals:  Vitals Value Taken Time  BP 133/86 05/18/19 1438  Temp    Pulse 65 05/18/19 1440  Resp 18 05/18/19 1440  SpO2 98 % 05/18/19 1440  Vitals shown include unvalidated device data.  Last Pain:  Vitals:   05/18/19 1017  TempSrc:   PainSc: 0-No pain         Complications: No apparent anesthesia complications

## 2019-05-18 NOTE — Anesthesia Preprocedure Evaluation (Signed)
Anesthesia Evaluation  Patient identified by MRN, date of birth, ID band Patient awake    Reviewed: Allergy & Precautions, NPO status , Patient's Chart, lab work & pertinent test results  Airway Mallampati: II  TM Distance: >3 FB Neck ROM: Full    Dental  (+) Teeth Intact, Dental Advisory Given   Pulmonary asthma , sleep apnea , pneumonia, former smoker,    breath sounds clear to auscultation       Cardiovascular hypertension, + dysrhythmias Atrial Fibrillation  Rhythm:Regular Rate:Normal     Neuro/Psych PSYCHIATRIC DISORDERS Anxiety Depression    GI/Hepatic hiatal hernia, GERD  ,  Endo/Other  Morbid obesity  Renal/GU      Musculoskeletal  (+) Arthritis ,   Abdominal (+) + obese,   Peds  Hematology   Anesthesia Other Findings   Reproductive/Obstetrics                            Anesthesia Physical  Anesthesia Plan  ASA: III  Anesthesia Plan: General   Post-op Pain Management:    Induction: Intravenous  PONV Risk Score and Plan: 4 or greater and Ondansetron, Dexamethasone and Treatment may vary due to age or medical condition  Airway Management Planned: Oral ETT  Additional Equipment: None  Intra-op Plan:   Post-operative Plan: Extubation in OR  Informed Consent: I have reviewed the patients History and Physical, chart, labs and discussed the procedure including the risks, benefits and alternatives for the proposed anesthesia with the patient or authorized representative who has indicated his/her understanding and acceptance.     Dental advisory given  Plan Discussed with: CRNA  Anesthesia Plan Comments:        Anesthesia Quick Evaluation                                  Anesthesia Evaluation  Patient identified by MRN, date of birth, ID band Patient awake    Reviewed: Allergy & Precautions, NPO status , Patient's Chart, lab work & pertinent test  results  Airway Mallampati: II  TM Distance: >3 FB Neck ROM: Full    Dental  (+) Teeth Intact, Dental Advisory Given   Pulmonary former smoker,    breath sounds clear to auscultation       Cardiovascular  Rhythm:Regular Rate:Normal     Neuro/Psych    GI/Hepatic   Endo/Other    Renal/GU      Musculoskeletal   Abdominal (+) + obese,   Peds  Hematology   Anesthesia Other Findings   Reproductive/Obstetrics                             Anesthesia Physical Anesthesia Plan  ASA: III  Anesthesia Plan: General   Post-op Pain Management:    Induction: Intravenous  PONV Risk Score and Plan: Ondansetron and Dexamethasone  Airway Management Planned: Oral ETT  Additional Equipment:   Intra-op Plan:   Post-operative Plan: Extubation in OR  Informed Consent: I have reviewed the patients History and Physical, chart, labs and discussed the procedure including the risks, benefits and alternatives for the proposed anesthesia with the patient or authorized representative who has indicated his/her understanding and acceptance.   Dental advisory given  Plan Discussed with: CRNA and Anesthesiologist  Anesthesia Plan Comments:         Anesthesia Quick Evaluation

## 2019-05-18 NOTE — Op Note (Addendum)
OPERATIVE NOTE   Surgeon: Jeral Pinch  Assistants: Dr Lahoma Crocker (an MD assistant was necessary for tissue manipulation, management of robotic instrumentation, retraction and positioning due to the complexity of the case and hospital policies).   Anesthesia: GET  Pre-operative Diagnosis: endometrial cancer grade 1  Post-operative Diagnosis: same  Operation: Robotic-assisted laparoscopic total hysterectomy with bilateral salpingoophorectomy, SLN biopsy Modifier 22: Given BMI >40, obesity made retroperitoneal visualization limited and increased the complexity of the case and necessitated additional instrumentation for retraction. Obesity related complexity increased the duration of the procedure by 40 minutes.    Urine Output: 150 cc  Operative Findings On EUA, 8-10 cm mobile uterus, narrow pelvis. On intra-abdominal entry, adhesion of the omentum to the anterior abdominal wall in the midline. Physiologic adhesions of the cecum to the right sidewall and the sigmoid to the left sidewall. Uterus 10 cm with bulbous fundus. Normal appearing bilateral tubes and ovaries. Peritoneum and parametria somewhat friable. Mapping successful to bilateral external iliac sentinel lymph nodes.  Estimated Blood Loss: 100 cc   Total IV Fluids: 1,000 ml         Specimens: uterus, cervix, bilateral tubes and ovaries, bilateral external iliac sentinel lymph nodes         Complications:  None; patient tolerated the procedure well.         Disposition: PACU - hemodynamically stable.  Procedure Details  The patient was seen in the Holding Room. The risks, benefits, complications, treatment options, and expected outcomes were discussed with the patient.  The patient concurred with the proposed plan, giving informed consent. The patient was identified as Stephanie Jarvis and the procedure verified as a Robotic-assisted hysterectomy with bilateral salpingo oophorectomy with SLN biopsy. A Time Out was  held and the above information confirmed.  Patient was placed in supine position after anesthesia induction. Her arms were tucked to her side with all appropriate precautions.  The shoulders were stabilized with padded shoulder blocks applied to the acromium processes.  The patient was placed in the semi-lithotomy position in [place].  The perineum was prepped with Hibiclens. The patient was then prepped and draped in the usual sterile fashion. Foley catheter was placed after prepping with betadine.  A sterile speculum was placed in the vagina.  The cervix was grasped with a single-tooth tenaculum. 2mg  total of ICG was injected into the cervical stroma at 2 and 9 o'clock with 1cc injected at a 1cm and 59mm depth (concentration 0.5mg /ml) in all locations. The cervix was dilated with Kennon Rounds dilators.  The ZUMI uterine manipulator with a medium colpotomizer ring was placed without difficulty.  A pneum occluder balloon was placed over the manipulator.  OG tube placement was confirmed and to suction.   Next, a 5 mm skin incision was made 1 cm below the subcostal margin in the midclavicular line.  The 5 mm Optiview port and scope was used for direct entry.  Opening pressure was under 10 mm CO2.  The abdomen was insufflated and the findings were noted as above.   At this point and all points during the procedure, the patient's intra-abdominal pressure did not exceed 15 mmHg. Next, a 10 mm skin incision was made superior to the umbilicus and a right and left port was placed about 8 cm lateral to the robot port on the right and left sides.  A fourth arm was placed in the right mid-abdomen, 8 cm lateral to the right-sided port.  All ports were placed under direct visualization.  The patient was placed in steep Trendelenburg.  The 5 mm Optiview port was exchanged for a 10 mm port under direct visualization. Sharp dissection and electrocautery were used to lyse the omental adhesion to the anterior abdominal wall. Bowel  was folded away into the upper abdomen.  The robot was docked in the normal manner.  The right and left peritoneum were opened parallel to the IP ligament to open the retroperitoneal spaces bilaterally; both round ligaments were taken. The ureters were visualized bilaterally. The SLN mapping was performed in bilateral pelvic basins. The para rectal and paravesical spaces were opened up entirely with careful dissection below the level of the ureters bilaterally and to the depth of the uterine artery origin in order to skeletonize the uterine "web" and ensure visualization of all parametrial channels.  Lymphatic channels were identified travelling to the following visualized sentinel lymph nodes: right and left external iliac. These SLN's were separated from their surrounding lymphatic tissue, removed and sent for permanent pathology.  The hysterectomy was started after noting the ureter again on the medial leaf of the broad ligament.  The peritoneum above the ureter was incised and stretched and the infundibulopelvic ligament was skeletonized, cauterized and cut.  The posterior peritoneum was taken down to the level of the KOH ring.  The anterior peritoneum was also taken down.  The bladder flap was created to the level of the KOH ring.  The uterine artery on the right side was skeletonized, cauterized and cut in the normal manner.  A similar procedure was performed on the left.  The colpotomy was made and the uterus, cervix, bilateral ovaries and tubes were amputated and delivered through the vagina.  Pedicles were inspected and excellent hemostasis was achieved.    The colpotomy at the vaginal cuff was closed with Vicryl on a CT1 needle in a running manner.  Irrigation was used and excellent hemostasis was achieved.  At this point in the procedure was completed.  Robotic instruments were removed under direct visulaization.  The robot was undocked. The 10 mm ports were closed with Vicryl on a UR-5 needle and  the fascia was closed with 0 Vicryl on a UR-5 needle.  The subcutaneous tissue was closed with 4-0 Vicryl and the skin with 4-0 Monocryl in a subcuticular manner.  Dermabond was applied.  Sponge, lap and needle counts correct x 2.  The patient was taken to the recovery room in stable condition.  The vagina was swabbed with  minimal bleeding noted.   All instrument and needle counts were correct x  3.   The patient was transferred to the recovery room in stable condition.  Jeral Pinch, MD

## 2019-05-18 NOTE — Anesthesia Procedure Notes (Signed)
Procedure Name: Intubation Date/Time: 05/18/2019 11:40 AM Performed by: Claudia Desanctis, CRNA Pre-anesthesia Checklist: Patient identified, Emergency Drugs available, Suction available and Patient being monitored Patient Re-evaluated:Patient Re-evaluated prior to induction Oxygen Delivery Method: Circle system utilized Preoxygenation: Pre-oxygenation with 100% oxygen Induction Type: IV induction Ventilation: Mask ventilation without difficulty Laryngoscope Size: 2 and Miller Grade View: Grade I Tube type: Oral Tube size: 7.0 mm Number of attempts: 1 Airway Equipment and Method: Stylet Placement Confirmation: ETT inserted through vocal cords under direct vision,  positive ETCO2 and breath sounds checked- equal and bilateral Secured at: 20 cm Tube secured with: Tape Dental Injury: Teeth and Oropharynx as per pre-operative assessment

## 2019-05-18 NOTE — Discharge Instructions (Signed)
We are not sending you home with lovenox to use after surgery. You can start your Eliquis 3 days after surgery (either Friday evening or Saturday am).  Return to work: 4 weeks (2 weeks with physical restrictions).  Activity: 1. Be up and out of the bed during the day.  Take a nap if needed.  You may walk up steps but be careful and use the hand rail.  Stair climbing will tire you more than you think, you may need to stop part way and rest.   2. No lifting or straining for 4 weeks.  3. No driving for 1 weeks.  Do Not drive if you are taking narcotic pain medicine.  4. Shower daily.  Use soap and water on your incision and pat dry; don't rub.   5. No sexual activity and nothing in the vagina for 8 weeks.  Medications:  - Take ibuprofen and tylenol first line for pain control. Take these regularly (every 6 hours) to decrease the build up of pain.  - If necessary, for severe pain not relieved by ibuprofen, contact Dr Charisse March office and you will be prescribed percocet.  - While taking percocet you should take sennakot every night to reduce the likelihood of constipation. If this causes diarrhea, stop its use.  Diet: 1. Low sodium Heart Healthy Diet is recommended.  2. It is safe to use a laxative if you have difficulty moving your bowels.   Wound Care: 1. Keep clean and dry.  Shower daily.  Reasons to call the Doctor:   Fever - Oral temperature greater than 100.4 degrees Fahrenheit  Foul-smelling vaginal discharge  Difficulty urinating  Nausea and vomiting  Increased pain at the site of the incision that is unrelieved with pain medicine.  Difficulty breathing with or without chest pain  New calf pain especially if only on one side  Sudden, continuing increased vaginal bleeding with or without clots.   Follow-up: 1. See Gaylyn Rong in 4 weeks.  Contacts: For questions or concerns you should contact:  Dr. Gaylyn Rong at [phone] After hours and on  week-ends call [phone] and ask to speak to the physician on call for Gynecologic Oncology  After Your Surgery  The information in this section will tell you what to expect after your surgery, both during your stay and after you leave. You will learn how to safely recover from your surgery. Write down any questions you have and be sure to ask your doctor or nurse.  What to Expect When you wake up after your surgery, you will be in the [facility] (PACU) or your recovery room. A nurse will be monitoring your body temperature, blood pressure, pulse, and oxygen levels. You may have a urinary catheter in your bladder to help monitor the amount of urine you are making. It should come out before you go home. You will also have compression boots on your lower legs to help your circulation. Your pain medication will be given through an IV line or in tablet form. If you are having pain, tell your nurse. Your nurse will tell you how to recover from your surgery. Below are examples of ways you can help yourself recover safely.  You will be encouraged to walk with the help of your nurse or physical therapist. We will give you medication to relieve pain. Walking helps reduce the risk for blood clots and pneumonia. It also helps to stimulate your bowels so they begin working again.  Use your  incentive spirometer. This will help your lungs expand, which prevents pneumonia.   Commonly Asked Questions  Will I have pain after surgery? Yes, you will have some pain after your surgery, especially in the first few days. Your doctor and nurse will ask you about your pain often. You will be given medication to manage your pain as needed. If your pain is not relieved, please tell your doctor or nurse. It is important to control your pain so you can cough, breathe deeply, use your incentive spirometer, and get out of bed and walk.  Will I be able to eat? Yes, you will be able to eat a regular diet or  eat as tolerated. You should start with foods that are soft and easy to digest such as apple sauce and chicken noodle soup. Eat small meals frequently, and then advance to regular foods. If you experience bloating, gas, or cramps, limit high-fiber foods, including whole grain breads and cereal, nuts, seeds, salads, fresh fruit, broccoli, cabbage, and cauliflower. Will I have pain when I am home? The length of time each person has pain or discomfort varies. You may still have some pain when you go home and will probably be taking pain medication. Follow the guidelines below.  Take your medications as directed and as needed.  Call your doctor if the medication prescribed for you doesn't relieve your pain.  Don't drive or drink alcohol while you're taking prescription pain medication.  As your incision heals, you will have less pain and need less pain medication. A mild pain reliever such as acetaminophen (Tylenol) or ibuprofen (Advil) will relieve aches and discomfort. However, large quantities of acetaminophen may be harmful to your liver. Don't take more acetaminophen than the amount directed on the bottle or as instructed by your doctor or nurse.  Pain medication should help you as you resume your normal activities. Take enough medication to do your exercises comfortably. Pain medication is most effective 30 to 45 minutes after taking it.  Keep track of when you take your pain medication. Taking it when your pain first begins is more effective than waiting for the pain to get worse. Pain medication may cause constipation (having fewer bowel movements than what is normal for you).  How can I prevent constipation?  Go to the bathroom at the same time every day. Your body will get used to going at that time.  If you feel the urge to go, don't put it off. Try to use the bathroom 5 to 15 minutes after meals.  After breakfast is a good time to move your bowels. The reflexes in your colon are  strongest at this time.  Exercise, if you can. Walking is an excellent form of exercise.  Drink 8 (8-ounce) glasses (2 liters) of liquids daily, if you can. Drink water, juices, soups, ice cream shakes, and other drinks that don't have caffeine. Drinks with caffeine, such as coffee and soda, pull fluid out of the body.  Slowly increase the fiber in your diet to 25 to 35 grams per day. Fruits, vegetables, whole grains, and cereals contain fiber. If you have an ostomy or have had recent bowel surgery, check with your doctor or nurse before making any changes in your diet.  Both over-the-counter and prescription medications are available to treat constipation. Start with 1 of the following over-the-counter medications first: o Docusate sodium (Colace) 100 mg. Take ___1__ capsules _2____ times a day. This is a stool softener that causes few side effects. Don't  take it with mineral oil. o Polyethylene glycol (MiraLAX) 17 grams daily. o Senna (Senokot) 2 tablets at bedtime. This is a stimulant laxative, which can cause cramping.  If you haven't had a bowel movement in 2 days, call your doctor or nurse.  Can I shower? Yes, you should shower 24 hours after your surgery. Be sure to shower every day. Taking a warm shower is relaxing and can help decrease muscle aches. Use soap when you shower and gently wash your incision. Pat the areas dry with a towel after showering, and leave your incision uncovered (unless there is drainage). Call your doctor if you see any redness or drainage from your incision. Don't take tub baths until you discuss it with your doctor at the first appointment after your surgery. How do I care for my incisions? You will have several small incisions on your abdomen. The incisions are closed with Steri-Strips or Dermabond. You may also have square white dressings on your incisions ([place]). You can remove these in the shower 24 hours after your surgery. You should clean your  incisions with soap and water. If you go home with Steri-Strips on your incision, they will loosen and may fall off by themselves. If they haven't fallen off within 10 days, you can remove them. If you go home with Dermabond over your sutures (stitches), it will also loosen and peel off.  What are the most common symptoms after a hysterectomy? It's common for you to have some vaginal spotting or light bleeding. You should monitor this with a pad or a panty liner. If you have having heavy bleeding (bleeding through a pad or liner every 1 to 2 hours), call your doctor right away. It's also common to have some discomfort after surgery from the air that was pumped into your abdomen during surgery. To help with this, walk, drink plenty of liquids and make sure to take the stool softeners you received.  When is it safe for me to drive? You may resume driving 2 weeks after surgery, as long as you are not taking pain medication that may make you drowsy.  When can I resume sexual activity? Do not place anything in your vagina or have vaginal intercourse for 8 weeks after your surgery. Some people will need to wait longer than 8 weeks, so speak with your doctor before resuming sexual intercourse.  Will I be able to travel? Yes, you can travel. If you are traveling by plane within a few weeks after your surgery, make sure you get up and walk every hour. Be sure to stretch your legs, drink plenty of liquids, and keep your feet elevated when possible.  Will I need any supplies? Most people do not need any supplies after the surgery. In the rare case that you do need supplies, such as tubes or drains, your nurse will order them for you.  When can I return to work? The time it takes to return to work depends on the type of work you do, the type of surgery you had, and how fast your body heals. Most people can return to work about 2 to 4 weeks after the surgery.  What exercises can I do? Exercise will help  you gain strength and feel better. Walking and stair climbing are excellent forms of exercise. Gradually increase the distance you walk. Climb stairs slowly, resting or stopping as needed. Ask your doctor or nurse before starting more strenuous exercises.  When can I lift heavy objects? Most people  should not lift anything heavier than 10 pounds (4.5 kilograms) for at least 4 weeks after surgery. Speak with your doctor about when you can do heavy lifting.  How can I cope with my feelings? After surgery for a serious illness, you may have new and upsetting feelings. Many people say they felt weepy, sad, worried, nervous, irritable, and angry at one time or another. You may find that you can't control some of these feelings. If this happens, it's a good idea to seek emotional support. The first step in coping is to talk about how you feel. Family and friends can help. Your nurse, doctor, and social worker can reassure, support, and guide you. It's always a good idea to let these professionals know how you, your family, and your friends are feeling emotionally. Many resources are available to patients and their families. Whether you're in the hospital or at home, the nurses, doctors, and social workers are here to help you and your family and friends handle the emotional aspects of your illness.  When is my first appointment after surgery? Your first appointment after surgery will be 2 to 4 weeks after surgery. Your nurse will give you instructions on how to make this appointment, including the phone number to call.  What if I have other questions? If you have any questions or concerns, please talk with your doctor or nurse. You can reach them Monday through Friday from 9:00 am to 5:00 pm. After 5:00 pm, during the weekend, and on holidays, call [phone] and ask for the doctor on call for your doctor.   Have a temperature of 101 F (38.3 C) or higher  Have pain that does not get better with pain  medication  Have redness, drainage, or swelling from your incisions

## 2019-05-19 ENCOUNTER — Encounter: Payer: Self-pay | Admitting: Gynecologic Oncology

## 2019-05-19 ENCOUNTER — Encounter (HOSPITAL_COMMUNITY): Payer: Self-pay | Admitting: Gynecologic Oncology

## 2019-05-19 ENCOUNTER — Telehealth: Payer: Self-pay

## 2019-05-19 NOTE — Anesthesia Postprocedure Evaluation (Signed)
Anesthesia Post Note  Patient: Stephanie Jarvis  Procedure(s) Performed: XI ROBOTIC ASSISTED TOTAL HYSTERECTOMY WITH BILATERAL SALPINGO OOPHORECTOMY (N/A ) SENTINEL LYMPH NODE BIOPSY (N/A )     Patient location during evaluation: PACU Anesthesia Type: General Level of consciousness: awake and alert Pain management: pain level controlled Vital Signs Assessment: post-procedure vital signs reviewed and stable Respiratory status: spontaneous breathing, nonlabored ventilation, respiratory function stable and patient connected to nasal cannula oxygen Cardiovascular status: blood pressure returned to baseline and stable Postop Assessment: no apparent nausea or vomiting Anesthetic complications: no    Last Vitals:  Vitals:   05/18/19 1800 05/18/19 2000  BP: 124/75 128/72  Pulse: 66 65  Resp: 16 20  Temp:  36.8 C  SpO2: 94% 96%    Last Pain:  Vitals:   05/18/19 2000  TempSrc:   PainSc: [name]

## 2019-05-19 NOTE — Telephone Encounter (Signed)
I spoke with Ms Villiard this am.  She states she is feeling "good".  She is voiding without difficulty. She is eating without difficulty. She has been ambulating.  She is passing gas.  She has mild pain that is controlled with ibuprofen.  Her incisions are c/d/i.  She has minimal vaginal drainage.  I encouraged her to call us with an questions or concerns.  She verbalized understanding.

## 2019-05-20 ENCOUNTER — Other Ambulatory Visit: Payer: Self-pay

## 2019-05-24 ENCOUNTER — Inpatient Hospital Stay (HOSPITAL_BASED_OUTPATIENT_CLINIC_OR_DEPARTMENT_OTHER): Payer: Medicare Other | Admitting: Gynecologic Oncology

## 2019-05-24 ENCOUNTER — Encounter: Payer: Self-pay | Admitting: Gynecologic Oncology

## 2019-05-24 ENCOUNTER — Other Ambulatory Visit: Payer: Self-pay | Admitting: Oncology

## 2019-05-24 ENCOUNTER — Telehealth: Payer: Self-pay | Admitting: *Deleted

## 2019-05-24 DIAGNOSIS — C541 Malignant neoplasm of endometrium: Secondary | ICD-10-CM

## 2019-05-24 DIAGNOSIS — C778 Secondary and unspecified malignant neoplasm of lymph nodes of multiple regions: Secondary | ICD-10-CM

## 2019-05-24 NOTE — Progress Notes (Signed)
Gynecologic Oncology Multi-Disciplinary Disposition Conference Note  Date of the Conference: 05/24/2019  Patient Name: Stephanie Jarvis  Referring Provider: Dr. Legrand Rams Primary GYN Oncologist: Dr. Berline Lopes  Stage/Disposition:  Stage 3C1 endometrial cancer. Disposition is to imaging followed by chemotherapy with carboplatin/taxol and vaginal bracytherapy.   This Multidisciplinary conference took place involving physicians from [place], [place], Radiation Oncology, Pathology, Radiology along with the Gynecologic Oncology Nurse Practitioner and RN.  Comprehensive assessment of the patient's malignancy, staging, need for surgery, chemotherapy, radiation therapy, and need for further testing were reviewed. Supportive measures, both inpatient and following discharge were also discussed. The recommended plan of care is documented. Greater than 35 minutes were spent correlating and coordinating this patient's care.

## 2019-05-24 NOTE — Progress Notes (Signed)
Gynecologic Oncology Telehealth Consult Note: Gyn-Onc  I connected with Stephanie Jarvis on 05/24/19 at  3:15 PM EST by telephone and verified that I am speaking with the correct person using two identifiers.  I discussed the limitations, risks, security and privacy concerns of performing an evaluation and management service by telemedicine and the availability of in-person appointments. I also discussed with the patient that there may be a patient responsible charge related to this service. The patient expressed understanding and agreed to proceed.  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  Insufficient material for HER2 testing.   12/28/2013 Breast MRI   Post biopsy changes located within the left breast laterally (upper-outer quadrant) related to the patient's recent stereotactic biopsy. Also, postsurgical scarring changes within the left breast. No areas of worrisome enhancement within the right breast.   01/26/2014 Definitive Surgery   Left modified radical mastectomy by Dr. Arnoldo Morale   02/01/2014 Pathology Results   Intermediate grade DCIS, 0.5 cm, 0/6 lymph nodes, negative resection margins.   03/13/2016 Imaging   Bone density- BMD as determined from Femur Neck Right is 1.023 g/cm2 with a T-Score of -0.1. This patient is considered normal according to [facility]) criteria.   Endometrial cancer determined by uterine biopsy ([place])  04/22/2019 Initial Biopsy   EMB - Gr1 EMCA   04/28/2019 Initial Diagnosis   Endometrial cancer determined by uterine biopsy ([place])   05/18/2019 Surgery   TRH/BSO, bilateral  SLNs SLNs bulky.    Pathologic Stage   IIIC1, +LVSI, outer half MI, endometrioid adenoca, G 1-2     Interval History: Patient is overall doing very well after surgery.  She reports minimal pain.  She denies any issues with her incision other than some mild itching.  She notes the glue is intact and denies any surrounding redness or drainage.  She denies any vaginal bleeding or discharge.  She had some mild spotting immediately after surgery that has since resolved.  She had some difficulty emptying her bladder immediately after surgery but now is reporting normal urinary function.  She took a Senokot on Friday after surgery and endorses normal bowel function since.  Past Medical/Surgical History: Past Medical History:  Diagnosis Date  . Ankle pain   . Anxiety   . Arthritis   . Asthma    Seasonal asthma  . Breast cancer ([place]) 2008   ILC of Left breast; ER+  . Breast cancer ([place]) 2015   IDC+DCIS of Left breast; ER/PR+, Her2-, Ki67 = 4%  . Complication of anesthesia    Bad gag reflex  . Depression   . Dysrhythmia    A-fib  . Endometrial cancer ([place])   . GERD (gastroesophageal reflux disease)   . H/O hiatal hernia 2003  . History of atrial fibrillation 2011  . History of blood transfusion   . History of hepatitis A    Age 16  . History of pneumonia   . Hyperactive gag reflex   . Hyperlipidemia   . Hypertension   . Knee pain   . Obesity, Class III, BMI 40-49.9 (morbid obesity) ([place])   . Pneumonia   . Sciatica   . Sleep  apnea    cpap  . Vertigo     Past Surgical History:  Procedure Laterality Date  . BREAST LUMPECTOMY Left 1982,2008   x 2   . CHOLECYSTECTOMY N/A 12/31/2016   Procedure: LAPAROSCOPIC CHOLECYSTECTOMY WITH POSSIBLE INTRAOPERATIVE CHOLANGIOGRAM;  Surgeon: Rolm Bookbinder, MD;  Location: [place];  Service: General;  Laterality: N/A;  . COLONOSCOPY    . EYE SURGERY     bil cataract surgery  . HIATAL HERNIA REPAIR  2003  . Lymph node removal  2008  .  MASTECTOMY MODIFIED RADICAL Left 01/26/2014   Procedure: LEFT MODIFIED RADICAL MASTECTOMY;  Surgeon: Jamesetta So, MD;  Location: [facility];  Service: General;  Laterality: Left;  . PORT-A-CATH REMOVAL  2009 ish  . PORTA CATH INSERTION  [id number]  . ROBOTIC ASSISTED TOTAL HYSTERECTOMY WITH BILATERAL SALPINGO OOPHERECTOMY N/A 05/18/2019   Procedure: XI ROBOTIC ASSISTED TOTAL HYSTERECTOMY WITH BILATERAL SALPINGO OOPHORECTOMY;  Surgeon: Lafonda Mosses, MD;  Location: [facility];  Service: Gynecology;  Laterality: N/A;  . SENTINEL NODE BIOPSY N/A 05/18/2019   Procedure: SENTINEL LYMPH NODE BIOPSY;  Surgeon: Lafonda Mosses, MD;  Location[facility];  Service: Gynecology;  Laterality: N/A;  . TOTAL KNEE ARTHROPLASTY Right 2006  . TOTAL KNEE ARTHROPLASTY  08/12/2011   Procedure: TOTAL KNEE ARTHROPLASTY;  Surgeon: Arther Abbott, MD;  Location: [facility];  Service: Orthopedics;  Laterality: Left;  . TUBAL LIGATION  1980    Family History  Problem Relation Age of Onset  . Stroke Father   . Heart disease Father   . Skin cancer Sister        dx. 66-58; unknown type  . Throat cancer Maternal Uncle 75       smoker; voicebox removed  . Alzheimer's disease Paternal Grandmother   . Heart attack Paternal Grandfather   . Alzheimer's disease Maternal Aunt   . Heart disease Maternal Uncle   . Lung cancer Cousin        smoker  . Lung cancer Paternal Aunt        dx. mid-70s; worked at [employer]  . Heart attack Daughter        caused by thyroid issues  . Heart disease Other   . Arthritis Other   . Lung disease Other   . Cancer Other   . Kidney disease Other   . Colon cancer Other   . Ovarian cancer Other   . Endometrial cancer Other     Social History   Socioeconomic History  . Marital status: Widowed    Spouse name: Not on file  . Number of children: 2  . Years of education: 37  . Highest education level: Not on file  Occupational History  . Occupation: retired  Scientific laboratory technician  .  Financial resource strain: Not on file  . Food insecurity    Worry: Not on file    Inability: Not on file  . Transportation needs    Medical: Not on file    Non-medical: Not on file  Tobacco Use  . Smoking status: Former Smoker    Packs/day: 3.00    Years: 25.00    Pack years: 75.00    Types: Cigarettes    Quit date: 06/30/1997    Years since quitting: 21.9  . Smokeless tobacco: Never Used  Substance and Sexual Activity  . Alcohol use: Yes    Alcohol/week: 0.0 standard drinks    Comment: 2 glasses wine per month  . Drug use: No  .  Sexual activity: Not Currently    Birth control/protection: Surgical    Comment: tubal  Lifestyle  . Physical activity    Days per week: Not on file    Minutes per session: Not on file  . Stress: Not on file  Relationships  . Social Herbalist on phone: Not on file    Gets together: Not on file    Attends religious service: Not on file    Active member of club or organization: Not on file    Attends meetings of clubs or organizations: Not on file    Relationship status: Not on file  Other Topics Concern  . Not on file  Social History Narrative   Drinks 2-3 cups of coffee a day     Current Medications:  Current Outpatient Medications:  .  albuterol (PROVENTIL) (2.5 MG/3ML) 0.083% nebulizer solution, Take 3 mLs by nebulization 4 (four) times daily as needed for wheezing or shortness of breath. , Disp: , Rfl:  .  albuterol (VENTOLIN HFA) 108 (90 Base) MCG/ACT inhaler, Inhale 1-2 puffs into the lungs every 6 (six) hours as needed for wheezing or shortness of breath., Disp: , Rfl:  .  apixaban (ELIQUIS) 5 MG TABS tablet, Take 1 tablet (5 mg total) by mouth 2 (two) times daily., Disp: 180 tablet, Rfl: 3 .  baclofen (LIORESAL) 20 MG tablet, Take 20 mg by mouth 2 (two) times daily as needed for muscle spasms. , Disp: , Rfl:  .  buPROPion (WELLBUTRIN XL) 300 MG 24 hr tablet, Take 300 mg by mouth daily., Disp: , Rfl:  .  Calcium  Carbonate-Vit D-Min (CALCIUM 1200 PO), Take 1 tablet by mouth daily. , Disp: , Rfl:  .  fexofenadine (ALLEGRA) 180 MG tablet, Take 180 mg by mouth daily., Disp: , Rfl:  .  furosemide (LASIX) 40 MG tablet, Take 1 tablet (40 mg total) by mouth 2 (two) times daily., Disp: 180 tablet, Rfl: 3 .  ibuprofen (ADVIL) 600 MG tablet, Take 1 tablet (600 mg total) by mouth every 6 (six) hours as needed for moderate pain. For AFTER surgery, Disp: 30 tablet, Rfl: 1 .  metoprolol tartrate (LOPRESSOR) 25 MG tablet, Take 1 tablet (25 mg total) by mouth 2 (two) times daily., Disp: 180 tablet, Rfl: 3 .  omeprazole (PRILOSEC) 20 MG capsule, Take 20 mg by mouth 2 (two) times daily. , Disp: , Rfl:  .  oxyCODONE (OXY IR/ROXICODONE) 5 MG immediate release tablet, Take 1 tablet (5 mg total) by mouth every 4 (four) hours as needed for severe pain. For AFTER surgery only, do not take and drive, Disp: 10 tablet, Rfl: 0 .  potassium chloride SA (KLOR-CON M20) 20 MEQ tablet, Take 2 tablets (40 mEq total) by mouth every morning. & 20 meq in the evening (Patient taking differently: Take 20 mEq by mouth 2 (two) times daily. ), Disp: 180 tablet, Rfl: 3 .  rosuvastatin (CRESTOR) 20 MG tablet, Take 20 mg by mouth at bedtime. , Disp: , Rfl:  .  senna-docusate (SENOKOT-S) 8.6-50 MG tablet, Take 2 tablets by mouth at bedtime. For AFTER surgery, do not take if having diarrhea, Disp: 60 tablet, Rfl: 1 .  sertraline (ZOLOFT) 100 MG tablet, Take 100 mg by mouth at bedtime., Disp: , Rfl:   Review of Symptoms: Complete review is negative except as above in Interval History.  Physical Exam: There were no vitals taken for this visit. Not performed given limitations of this visit type.  Laboratory &  Radiologic Studies: FINAL MICROSCOPIC DIAGNOSIS:   A. LYMPH NODE, SENTINEL, RIGHT EXTERNAL ILIAC, BIOPSY:  - Metastatic adenocarcinoma in one lymph node (1/1).   B. LYMPH NODE, SENTINEL, LEFT EXTERNAL ILIAC, BIOPSY:  - Metastatic  adenocarcinoma in one lymph node (1/1).   C. UTERUS, CERVIX, BILATERAL FALLOPIAN TUBES AND OVARIES, HYSTERECTOMY  WITH  SALPINGO-OOPHORECTOMY:  - Endometrioid adenocarcinoma, 5.5 cm.  - Carcinoma focally invades outer half of the myometrium.  - Lymphovascular space involvement by tumor.  - Margins not involved.  - Cervix, bilateral ovaries and bilateral fallopian tubes free of tumor.    ONCOLOGY TABLE:  UTERUS, CARCINOMA OR CARCINOSARCOMA  Procedure: Total hysterectomy with bilateral salpingo-oophorectomy and  two sentinel lymph nodes.  Histologic type: Endometrioid  Histologic Grade: FIGO grade 1/2.  Myometrial invasion:    Depth of invasion: 9 mm    Myometrial thickness: 12 mm  Uterine Serosa Involvement: Not identified.  Cervical stromal involvement: Not identified.  Extent of involvement of other organs: Not identified.  Lymphovascular invasion: Present.  Regional Lymph Nodes:    Examined:   2 Sentinel                0 non-sentinel                2 total     Lymph nodes with metastasis: 2     Isolated tumor cells (<0.2 mm): 0     Micrometastasis: (>0.2 mm and < 2.0 mm): 0     Macrometastasis: (>2.0 mm): 2     Extracapsular extension: Present, focal.  Representative Tumor Block: C3, C4, C5, C6 and C7.  MMR / MSI testing: Ordered.  Pathologic Stage Classification (pTNM, AJCC 8th edition): pT1b, pN1a  (v4.1.0.2)   SURGICAL PATHOLOGY  * THIS IS AN ADDENDUM REPORT *  CASE: [id number]  PATIENT: [facility]  Surgical Pathology Report  *Addendum *   Reason for Addendum #1: DNA Mismatch Repair IHC Results   Clinical History: Endometrial cancer ([place])      FINAL MICROSCOPIC DIAGNOSIS:   A. LYMPH NODE, SENTINEL, RIGHT EXTERNAL ILIAC, BIOPSY:  - Metastatic adenocarcinoma in one lymph node (1/1).   B. LYMPH NODE, SENTINEL, LEFT EXTERNAL ILIAC, BIOPSY:  - Metastatic adenocarcinoma in one lymph node (1/1).    C. UTERUS, CERVIX, BILATERAL FALLOPIAN TUBES AND OVARIES, HYSTERECTOMY  WITH  SALPINGO-OOPHORECTOMY:  - Endometrioid adenocarcinoma, 5.5 cm.  - Carcinoma focally invades outer half of the myometrium.  - Lymphovascular space involvement by tumor.  - Margins not involved.  - Cervix, bilateral ovaries and bilateral fallopian tubes free of tumor.    ONCOLOGY TABLE:  UTERUS, CARCINOMA OR CARCINOSARCOMA  Procedure: Total hysterectomy with bilateral salpingo-oophorectomy and  two sentinel lymph nodes.  Histologic type: Endometrioid  Histologic Grade: FIGO grade 1/2.  Myometrial invasion:    Depth of invasion: 9 mm    Myometrial thickness: 12 mm  Uterine Serosa Involvement: Not identified.  Cervical stromal involvement: Not identified.  Extent of involvement of other organs: Not identified.  Lymphovascular invasion: Present.  Regional Lymph Nodes:    Examined:   2 Sentinel                0 non-sentinel                2 total     Lymph nodes with metastasis: 2     Isolated tumor cells (<0.2 mm): 0     Micrometastasis: (>0.2 mm and < 2.0 mm): 0     Macrometastasis: (>  2.0 mm): 2     Extracapsular extension: Present, focal.  Representative Tumor Block: C3, C4, C5, C6 and C7.  MMR / MSI testing: Ordered.  Pathologic Stage Classification (pTNM, AJCC 8th edition): pT1b, pN1a  (v4.1.0.2)   GROSS DESCRIPTION:   A: The specimen is received fresh and consists of a 1.3 x 1.0 x 0.7 cm  aggregate of tan-pink, disrupted possible lymph node. The specimen is  sectioned and entirely submitted in 3 cassettes.   B: The specimen is received fresh and consists of a 1.9 x 1.3 x 0.8 cm  tan-pink possible lymph node. The specimen is serially sectioned and  entirely submitted in 2 cassettes.   C: Specimen: Specimen is received fresh labeled uterus, cervix,  bilateral tubes and ovaries  Specimen integrity: Intact uterus and cervix with  attached bilateral  adnexa  Size and shape: Symmetrical uterus measuring 8.6 cm fundus to cervix by  5.0 cm cornu to cornu by 5.0 cm anterior posterior  Weight: 125 g  Serosa: Tan-pink, smooth, hyperemic, and focally disrupted along the  posterior aspect  Cervix: Ectocervix is tan-pink, smooth, hyperemic, and measures 2.8 x  1.7 cm with a 1.0 cm cervical os. The endocervical canal measures 3.0  cm in length, and is tan-pink and corrugated. The cervical stroma is  tan-white and fibrous.  Endometrium: Triangular endometrial cavity measures approximately 6.3 x  5.5cm, and is filled with a 5.5 x 5.1 cm tan-pink, friable, exophytic  lesion. The lesion displays a tan-white softened cut surface, and  invades approximately 0.9 cm with the myometrial thickness is 1.2 cm.  Myometrium: Myometrium is tan-pink and trabeculated, and no intramural  lesions are grossly identified.  Right adnexa: Right fimbriated fallopian tube is disrupted with blunted  ends, suggestive of previous tubal ligation. Remaining segments measure  1.5 and 2.3 cm in length by 0.7 cm in diameter. The serosal surfaces  are tan-red, with mild adhesions. Sectioning the distal segment reveals  a tan-white cut surface with a pinpoint lumen. The adjacent right ovary  measures 1.3 x 1.1 x 0.8 cm. The outer surface is tan-white and smooth.  Sectioning the fallopian tube reveals a tan-white fibrous cut surface.  Left adnexa: The left fimbriated fallopian tube displays a blunted  proximal end, suggestive of previous tubal ligation. 2 measures 2.4 cm  in length by 0.8 cm in diameter. The serosal surface is tan-gray,  hyperemic, with mild adhesions. Sectioning of fallopian tube reveals a  tan cut surface with a pinpoint lumen. The adjacent left ovary measures  2.0 x 1.2 x 1.0 cm. The outer surface is tan-pink and smooth.  Sectioning the ovary reveals a tan fibrous cut surface.  Block Summary:  12 blocks submitted  1 =  anterior cervix and lower uterine segment  2 = posterior cervix and lower uterine segment  3-5 = anterior endomyometrium  6 and 7 = posterior endomyometrium  8 and 9 = right fallopian tube  10 = right ovary  11 = left fallopian tube  12 = left ovary Craig Staggers 05/21/2019)     Final Diagnosis performed by Claudette Laws, MD.  Electronically signed  05/20/2019  Technical and / or Professional components performed at [facility], [place] [address] [place], [place] [address].  Immunohistochemistry Technical component (if applicable) was performed  at [facility]. [address] [place],  [place], [place] [address].  IMMUNOHISTOCHEMISTRY DISCLAIMER (if applicable):  Some of these immunohistochemical stains may have been developed and the  performance characteristics determine by [facility]. Some  may not have been cleared or approved by the U.S. Food and Drug  Administration. The FDA has determined that such clearance or approval  is not necessary. This test is used for clinical purposes. It should not  be regarded as investigational or for research. This laboratory is  certified under the [place]  (CLIA-88) as qualified to perform high complexity clinical laboratory  testing. The controls stained appropriately.     ADDENDUM:   Mismatch Repair Protein (IHC)   SUMMARY INTERPRETATION: ABNORMAL   There is loss of the major and minor MMR proteins MLH1 and PMS2. The  loss of expression may be secondary to promoter hyper-methylation, gene  mutation or other genetic event. BRAF mutation testing and/or MLH1  methylation testing is indicated. The presence of a BRAF mutation and/or  MLH1 hypermethylation is indicative of a sporadic-type tumor. The  absence of either BRAF mutation and/or presence of normal methylation  indicate the possible presence of a hereditary germline mutation (e.g.  Lynch  syndrome) and referral to genetic counseling is warranted. It is  recommended that the loss of protein expression be correlated with  molecular based MSI testing.   IHC EXPRESSION RESULTS   TEST      RESULT  MLH1:     LOSS OF NUCLEAR EXPRESSION  MSH2:     Preserved nuclear expression  MSH6:     Preserved nuclear expression  PMS2:     LOSS OF NUCLEAR EXPRESSION   Assessment & Plan: Stephanie Jarvis is a 68 y.o. 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  Patient is overall doing very well after surgery.  We discussed continued postop goals and activity restrictions.  I discussed in detail of the final pathology findings from her surgery and their implications.  Given metastatic cancer found in both sentinel lymph nodes, I recommend proceeding with a CT scan to assess for evidence of metastatic disease.  The patient was presented at tumor board today with a consensus to proceed with systemic treatment with carboplatin and paclitaxel as well as vaginal brachytherapy.  The patient previously received treatment for breast cancer and although disappointed about her results, is ready to proceed with recommended treatment.  She noticed that she will be getting a call to schedule her CT scan and I will reach out to [name], our nurse navigator, to get the patient scheduled with Dr. Alvy Bimler as well as Dr. Sondra Come.  Her IHC showed loss of nuclear expression of MLH1 and PMS2.  I will follow up on her MSI testing.  She may need MLH1 promoter methylation testing to rule out Lynch syndrome.  I discussed the assessment and treatment plan with the patient. The patient was provided with an opportunity to ask questions and all were answered. The patient agreed with the plan and demonstrated an understanding of the instructions.   The patient was advised to call back or see an  in-person evaluation if the symptoms worsen or if the condition fails to improve as anticipated.   I provided 15 minutes of non face-to-face telephone visit time during this encounter, and > 50% was spent counseling as documented under my assessment & plan.  Jeral Pinch, MD  Division of Gynecologic Oncology  Department of Obstetrics and Gynecology  [facility]

## 2019-05-24 NOTE — Telephone Encounter (Signed)
Called and gave the patient the date/time of her CT scan. Gave the instructions for to go pick up contrast

## 2019-05-25 ENCOUNTER — Telehealth: Payer: Self-pay | Admitting: Oncology

## 2019-05-25 ENCOUNTER — Inpatient Hospital Stay: Payer: Medicare Other | Admitting: Gynecologic Oncology

## 2019-05-25 ENCOUNTER — Encounter: Payer: Self-pay | Admitting: Hematology and Oncology

## 2019-05-25 DIAGNOSIS — C541 Malignant neoplasm of endometrium: Secondary | ICD-10-CM

## 2019-05-25 LAB — SURGICAL PATHOLOGY

## 2019-05-25 NOTE — Telephone Encounter (Signed)
Stephanie Jarvis with appointment to see Dr. Alvy Bimler on 06/03/19 at 11:45 am.  She verbalized understanding of appointment.

## 2019-05-31 ENCOUNTER — Telehealth: Payer: Self-pay | Admitting: Oncology

## 2019-05-31 NOTE — Telephone Encounter (Signed)
[place] about where she would like to have her treatment.  She said she would prefer to go to [facility] since she lives in [place].  Advised her that we will try to reschedule her appointment for medical oncology and will call her back.

## 2019-06-01 ENCOUNTER — Telehealth: Payer: Self-pay | Admitting: Oncology

## 2019-06-01 NOTE — Telephone Encounter (Signed)
Left a message regarding genetic counseling.  Requested a return call. 

## 2019-06-02 ENCOUNTER — Ambulatory Visit (HOSPITAL_COMMUNITY)
Admission: RE | Admit: 2019-06-02 | Discharge: 2019-06-02 | Disposition: A | Discharge status: Home / Self Care | Payer: Medicare Other | Source: Ambulatory Visit | Attending: Gynecologic Oncology | Admitting: Gynecologic Oncology

## 2019-06-02 ENCOUNTER — Other Ambulatory Visit: Payer: Self-pay

## 2019-06-02 ENCOUNTER — Telehealth: Payer: Self-pay | Admitting: Oncology

## 2019-06-02 DIAGNOSIS — C541 Malignant neoplasm of endometrium: Secondary | ICD-10-CM | POA: Diagnosis not present

## 2019-06-02 DIAGNOSIS — C55 Malignant neoplasm of uterus, part unspecified: Secondary | ICD-10-CM | POA: Diagnosis not present

## 2019-06-02 MED ORDER — IOHEXOL 300 MG/ML  SOLN
100.0000 mL | Freq: Once | INTRAMUSCULAR | Status: AC | PRN
  Administered 2019-06-02: 12:00:00 100 mL via INTRAVENOUS

## 2019-06-02 NOTE — Telephone Encounter (Signed)
Scheduled genetic counseling with Zeya for 06/14/19 at 11 am.  She verbalized understanding of appointment time.

## 2019-06-03 ENCOUNTER — Ambulatory Visit: Payer: Medicare Other | Admitting: Hematology and Oncology

## 2019-06-08 ENCOUNTER — Inpatient Hospital Stay: Payer: Medicare Other | Admitting: Gynecologic Oncology

## 2019-06-08 ENCOUNTER — Encounter: Payer: Self-pay | Admitting: *Deleted

## 2019-06-08 ENCOUNTER — Inpatient Hospital Stay (HOSPITAL_COMMUNITY): Payer: Medicare Other | Attending: Hematology | Admitting: Hematology

## 2019-06-08 ENCOUNTER — Other Ambulatory Visit: Payer: Self-pay

## 2019-06-08 ENCOUNTER — Encounter (HOSPITAL_COMMUNITY): Payer: Self-pay | Admitting: Hematology

## 2019-06-08 VITALS — BP 142/70 | HR 73 | Temp 96.7°F | Resp 20 | Wt 264.5 lb

## 2019-06-08 DIAGNOSIS — Z841 Family history of disorders of kidney and ureter: Secondary | ICD-10-CM | POA: Diagnosis not present

## 2019-06-08 DIAGNOSIS — Z809 Family history of malignant neoplasm, unspecified: Secondary | ICD-10-CM | POA: Diagnosis not present

## 2019-06-08 DIAGNOSIS — Z9012 Acquired absence of left breast and nipple: Secondary | ICD-10-CM | POA: Insufficient documentation

## 2019-06-08 DIAGNOSIS — Z818 Family history of other mental and behavioral disorders: Secondary | ICD-10-CM | POA: Diagnosis not present

## 2019-06-08 DIAGNOSIS — C542 Malignant neoplasm of myometrium: Secondary | ICD-10-CM | POA: Insufficient documentation

## 2019-06-08 DIAGNOSIS — Z8041 Family history of malignant neoplasm of ovary: Secondary | ICD-10-CM | POA: Diagnosis not present

## 2019-06-08 DIAGNOSIS — I4891 Unspecified atrial fibrillation: Secondary | ICD-10-CM

## 2019-06-08 DIAGNOSIS — Z853 Personal history of malignant neoplasm of breast: Secondary | ICD-10-CM

## 2019-06-08 DIAGNOSIS — Z9221 Personal history of antineoplastic chemotherapy: Secondary | ICD-10-CM | POA: Diagnosis not present

## 2019-06-08 DIAGNOSIS — I700 Atherosclerosis of aorta: Secondary | ICD-10-CM

## 2019-06-08 DIAGNOSIS — Z87891 Personal history of nicotine dependence: Secondary | ICD-10-CM | POA: Diagnosis not present

## 2019-06-08 DIAGNOSIS — Z923 Personal history of irradiation: Secondary | ICD-10-CM | POA: Insufficient documentation

## 2019-06-08 DIAGNOSIS — Z823 Family history of stroke: Secondary | ICD-10-CM

## 2019-06-08 DIAGNOSIS — Z7901 Long term (current) use of anticoagulants: Secondary | ICD-10-CM

## 2019-06-08 DIAGNOSIS — C541 Malignant neoplasm of endometrium: Secondary | ICD-10-CM

## 2019-06-08 DIAGNOSIS — Z800 Family history of malignant neoplasm of digestive organs: Secondary | ICD-10-CM | POA: Insufficient documentation

## 2019-06-08 DIAGNOSIS — Z8261 Family history of arthritis: Secondary | ICD-10-CM | POA: Insufficient documentation

## 2019-06-08 DIAGNOSIS — C775 Secondary and unspecified malignant neoplasm of intrapelvic lymph nodes: Secondary | ICD-10-CM | POA: Diagnosis not present

## 2019-06-08 DIAGNOSIS — K5730 Diverticulosis of large intestine without perforation or abscess without bleeding: Secondary | ICD-10-CM | POA: Diagnosis not present

## 2019-06-08 DIAGNOSIS — Z808 Family history of malignant neoplasm of other organs or systems: Secondary | ICD-10-CM | POA: Diagnosis not present

## 2019-06-08 DIAGNOSIS — Z8249 Family history of ischemic heart disease and other diseases of the circulatory system: Secondary | ICD-10-CM | POA: Insufficient documentation

## 2019-06-08 DIAGNOSIS — Z0000 Encounter for general adult medical examination without abnormal findings: Secondary | ICD-10-CM

## 2019-06-08 DIAGNOSIS — Z79899 Other long term (current) drug therapy: Secondary | ICD-10-CM | POA: Diagnosis not present

## 2019-06-08 MED ORDER — INFLUENZA VAC A&B SA ADJ QUAD 0.5 ML IM PRSY: 0.5000 mL | PREFILLED_SYRINGE | Freq: Once | INTRAMUSCULAR | Status: DC

## 2019-06-08 NOTE — Progress Notes (Signed)
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  Insufficient material for HER2 testing.   12/28/2013 Breast MRI   Post biopsy changes located within the left breast laterally (upper-outer quadrant) related to the patient's recent stereotactic biopsy. Also, postsurgical scarring changes within the left breast. No areas of worrisome enhancement within the right breast.   01/26/2014 Definitive Surgery   Left modified radical mastectomy by Dr. Arnoldo Morale   02/01/2014 Pathology Results   Intermediate grade DCIS, 0.5 cm, 0/6 lymph nodes, negative resection margins.   03/13/2016 Imaging   Bone density- BMD as determined from Femur Neck Right is 1.023 g/cm2 with a T-Score of -0.1. This patient is considered normal according to [facility]) criteria.   Endometrial cancer determined by uterine biopsy ([place])  04/22/2019 Initial Biopsy   EMB - Gr1 EMCA   04/28/2019 Initial Diagnosis   Endometrial cancer determined by uterine biopsy ([place])   05/18/2019 Surgery   TRH/BSO, bilateral SLNs SLNs bulky.    Pathologic Stage   IIIC1, +LVSI, outer half MI, endometrioid adenoca, G 1-2   06/29/2019 -  Chemotherapy   The patient had PALONOSETRON HCL INJECTION 0.25 MG/5ML, 0.25 mg, Intravenous,  Once, 0 of 6 cycles pegfilgrastim-jmdb (FULPHILA) injection 6 mg, 6 mg, Subcutaneous,  Once, 0 of 6 cycles CARBOplatin (PARAPLATIN) in sodium chloride 0.9 % 100 mL chemo infusion, , Intravenous,  Once, 0 of 6 cycles FOSAPREPITANT IV INFUSION 150 MG, 150 mg, Intravenous,  Once, 0 of 6 cycles PACLitaxel (TAXOL) 414 mg in sodium chloride 0.9 % 500 mL chemo infusion (> 23m/m2), 175 mg/m2, Intravenous,   Once, 0 of 6 cycles  for chemotherapy treatment.      Interval History: The patient is overall doing very well after surgery.  She denies any abdominal or pelvic pain.  She denies fevers, chills, nausea, emesis.  She endorses a good appetite and normal bowel and bladder function.  She has had minimal intermittent spotting only when she wipes after voiding that she says is not daily.  She denies any other vaginal bleeding or discharge.  Been careful about not lifting anything heavy.  She saw medical oncology yesterday and discussed further plan for adjuvant chemotherapy.  Past Medical/Surgical History: Past Medical History:  Diagnosis Date  . Ankle pain   . Anxiety   . Arthritis   . Asthma    Seasonal asthma  . Breast cancer ([facility] 2008   ILC of Left breast; ER+  . Breast cancer ([place]   IDC+DCIS of Left breast; ER/PR+, Her2-, Ki67 = 4%  . Complication of anesthesia    Bad gag reflex  . Depression   . Dysrhythmia    A-fib  . [place]ometrial cancer ([place]   . GERD (gastroesophageal reflux disease)   . H/O hiatal hernia 2003  . History of atrial fibrillation 2011  . History of blood transfusion   . History of hepatitis A    Age 68 . History of pneumonia   . Hyperactive gag reflex   . Hyperlipidemia   . Hypertension   . Knee pain   . Obesity, Class III, BMI 40-49.9 (morbid obesity) ([place]   . Pneumonia   . Sciatica   . Sleep apnea  cpap  . Vertigo     Past Surgical History:  Procedure Laterality Date  . BREAST LUMPECTOMY Left 1982,2008   x 2   . CHOLECYSTECTOMY N/A 12/31/2016   Procedure: LAPAROSCOPIC CHOLECYSTECTOMY WITH POSSIBLE INTRAOPERATIVE CHOLANGIOGRAM;  Surgeon: Rolm Bookbinder, MD;  Location: [facility];  Service: General;  Laterality: N/A;  . COLONOSCOPY    . EYE SURGERY     bil cataract surgery  . HIATAL HERNIA REPAIR  2003  . Lymph node removal  2008  . MASTECTOMY MODIFIED RADICAL Left 01/26/2014   Procedure: LEFT MODIFIED RADICAL MASTECTOMY;  Surgeon: Jamesetta So, MD;  Location: [facility];  Service: General;  Laterality: Left;  . PORT-A-CATH REMOVAL  2009 ish  . PORTA CATH INSERTION  [id number]  . ROBOTIC ASSISTED TOTAL HYSTERECTOMY WITH BILATERAL SALPINGO OOPHERECTOMY N/A 05/18/2019   Procedure: XI ROBOTIC ASSISTED TOTAL HYSTERECTOMY WITH BILATERAL SALPINGO OOPHORECTOMY;  Surgeon: Lafonda Mosses, MD;  Location: [facility];  Service: Gynecology;  Laterality: N/A;  . SENTINEL NODE BIOPSY N/A 05/18/2019   Procedure: SENTINEL LYMPH NODE BIOPSY;  Surgeon: Lafonda Mosses, MD;  [facility]: [facility];  Service: Gynecology;  Laterality: N/A;  . TOTAL KNEE ARTHROPLASTY Right 2006  . TOTAL KNEE ARTHROPLASTY  08/12/2011   Procedure: TOTAL KNEE ARTHROPLASTY;  Surgeon: Arther Abbott, MD;  Location: [facility];  Service: Orthopedics;  Laterality: Left;  . TUBAL LIGATION  1980    Family History  Problem Relation Age of Onset  . Stroke Father   . Heart disease Father   . Skin cancer Sister        dx. 48-58; unknown type  . Throat cancer Maternal Uncle 75       smoker; voicebox removed  . Alzheimer's disease Paternal Grandmother   . Heart attack Paternal Grandfather   . Alzheimer's disease Maternal Aunt   . Heart disease Maternal Uncle   . Lung cancer Cousin        smoker  . Lung cancer Paternal Aunt        dx. mid-70s; worked at [employer]  . Heart attack Daughter        caused by thyroid issues  . Heart disease Other   . Arthritis Other   . Lung disease Other   . Cancer Other   . Kidney disease Other   . Colon cancer Other   . Ovarian cancer Other   . Endometrial cancer Other     Social History   Socioeconomic History  . Marital status: Widowed    Spouse name: Not on file  . Number of children: 2  . Years of education: 59  . Highest education level: Not on file  Occupational History  . Occupation: retired  Scientific laboratory technician  . Financial resource strain: Not on file  . Food insecurity    Worry: Not on file    Inability: Not on file   . Transportation needs    Medical: Not on file    Non-medical: Not on file  Tobacco Use  . Smoking status: Former Smoker    Packs/day: 3.00    Years: 25.00    Pack years: 75.00    Types: Cigarettes    Quit date: 06/30/1997    Years since quitting: 21.9  . Smokeless tobacco: Never Used  Substance and Sexual Activity  . Alcohol use: Yes    Alcohol/week: 0.0 standard drinks    Comment: 2 glasses wine per month  . Drug use: No  . Sexual activity: Not Currently  Birth control/protection: Surgical    Comment: tubal  Lifestyle  . Physical activity    Days per week: Not on file    Minutes per session: Not on file  . Stress: Not on file  Relationships  . Social Herbalist on phone: Not on file    Gets together: Not on file    Attends religious service: Not on file    Active member of club or organization: Not on file    Attends meetings of clubs or organizations: Not on file    Relationship status: Not on file  Other Topics Concern  . Not on file  Social History Narrative   Drinks 2-3 cups of coffee a day     Current Medications:  Current Outpatient Medications:  .  albuterol (PROVENTIL) (2.5 MG/3ML) 0.083% nebulizer solution, Take 3 mLs by nebulization 4 (four) times daily as needed for wheezing or shortness of breath. , Disp: , Rfl:  .  albuterol (VENTOLIN HFA) 108 (90 Base) MCG/ACT inhaler, Inhale 1-2 puffs into the lungs every 6 (six) hours as needed for wheezing or shortness of breath., Disp: , Rfl:  .  apixaban (ELIQUIS) 5 MG TABS tablet, Take 1 tablet (5 mg total) by mouth 2 (two) times daily., Disp: 180 tablet, Rfl: 3 .  baclofen (LIORESAL) 20 MG tablet, Take 20 mg by mouth 2 (two) times daily as needed for muscle spasms. , Disp: , Rfl:  .  buPROPion (WELLBUTRIN XL) 300 MG 24 hr tablet, Take 300 mg by mouth daily., Disp: , Rfl:  .  Calcium Carbonate-Vit D-Min (CALCIUM 1200 PO), Take 1 tablet by mouth daily. , Disp: , Rfl:  .  fexofenadine (ALLEGRA) 180 MG  tablet, Take 180 mg by mouth daily., Disp: , Rfl:  .  furosemide (LASIX) 40 MG tablet, Take 1 tablet (40 mg total) by mouth 2 (two) times daily., Disp: 180 tablet, Rfl: 3 .  ibuprofen (ADVIL) 600 MG tablet, Take 1 tablet (600 mg total) by mouth every 6 (six) hours as needed for moderate pain. For AFTER surgery, Disp: 30 tablet, Rfl: 1 .  metoprolol tartrate (LOPRESSOR) 25 MG tablet, Take 1 tablet (25 mg total) by mouth 2 (two) times daily., Disp: 180 tablet, Rfl: 3 .  omeprazole (PRILOSEC) 20 MG capsule, Take 20 mg by mouth 2 (two) times daily. , Disp: , Rfl:  .  potassium chloride SA (KLOR-CON M20) 20 MEQ tablet, Take 2 tablets (40 mEq total) by mouth every morning. & 20 meq in the evening (Patient taking differently: Take 20 mEq by mouth 2 (two) times daily. ), Disp: 180 tablet, Rfl: 3 .  rosuvastatin (CRESTOR) 20 MG tablet, Take 20 mg by mouth at bedtime. , Disp: , Rfl:  .  sertraline (ZOLOFT) 100 MG tablet, Take 100 mg by mouth at bedtime., Disp: , Rfl:  No current facility-administered medications for this visit.   Facility-Administered Medications Ordered in Other Visits:  .  influenza vaccine adjuvanted (FLUAD) injection 0.5 mL, 0.5 mL, Intramuscular, Once, Derek Jack, MD  Review of Symptoms: Denies appetite changes, fevers, chills, fatigue, unexplained weight changes. Denies hearing loss, neck lumps or masses, mouth sores, ringing in ears or voice changes. Denies cough or wheezing.  Denies shortness of breath. Denies chest pain or palpitations. Denies leg swelling. Denies abdominal distention, pain, blood in stools, constipation, diarrhea, nausea, vomiting, or early satiety. Denies pain with intercourse, dysuria, frequency, hematuria or incontinence. Denies hot flashes, pelvic pain, vaginal bleeding or vaginal discharge.  Denies joint pain, back pain or muscle pain/cramps. Denies itching, rash, or wounds. Denies dizziness, headaches, numbness or seizures. Denies swollen  lymph nodes or glands, denies easy bruising or bleeding. Denies anxiety, depression, confusion, or decreased concentration.  Physical Exam: BP (!) 144/88 (BP Location: Right Arm, Patient Position: Sitting)   Pulse 74   Temp 98 F (36.7 C) (Temporal)   Resp 18   Ht 5' 5" (1.651 m)   Wt 263 lb (119.3 kg)   SpO2 98%   BMI 43.77 kg/m  General: Alert, oriented, no acute distress. HEENT: Atraumatic, sclera anicteric. Chest: Clear to auscultation bilaterally.   Cardiovascular: Regular rate and rhythm, no murmurs. Abdomen: Obese, soft, nontender.  Normoactive bowel sounds.  No masses or hepatosplenomegaly appreciated.  Robotic incisions healing well, no erythema or exudate. Extremities: Grossly normal range of motion.  Warm, well perfused.  Skin: No rashes or lesions noted. GU: Normal appearing external genitalia without erythema, excoriation, or lesions.  Speculum exam reveals intact vaginal cuff with some suture still visible.  No bleeding or discharge noted.  Bimanual exam reveals intact cuff, no tenderness with palpation or fluctuance.  Laboratory & Radiologic Studies: Pathology: A. LYMPH NODE, SENTINEL, RIGHT EXTERNAL ILIAC, BIOPSY:  - Metastatic adenocarcinoma in one lymph node (1/1).   B. LYMPH NODE, SENTINEL, LEFT EXTERNAL ILIAC, BIOPSY:  - Metastatic adenocarcinoma in one lymph node (1/1).   C. UTERUS, CERVIX, BILATERAL FALLOPIAN TUBES AND OVARIES, HYSTERECTOMY  WITH  SALPINGO-OOPHORECTOMY:  - Endometrioid adenocarcinoma, 5.5 cm.  - Carcinoma focally invades outer half of the myometrium.  - Lymphovascular space involvement by tumor.  - Margins not involved.  - Cervix, bilateral ovaries and bilateral fallopian tubes free of tumor.   ONCOLOGY TABLE:  UTERUS, CARCINOMA OR CARCINOSARCOMA  Procedure: Total hysterectomy with bilateral salpingo-oophorectomy and  two sentinel lymph nodes.  Histologic type: Endometrioid  Histologic Grade: FIGO grade 1/2.  Myometrial invasion:     Depth of invasion: 9 mm    Myometrial thickness: 12 mm  Uterine Serosa Involvement: Not identified.  Cervical stromal involvement: Not identified.  Extent of involvement of other organs: Not identified.  Lymphovascular invasion: Present.  Regional Lymph Nodes:    Examined:   2 Sentinel                0 non-sentinel                2 total     Lymph nodes with metastasis: 2     Isolated tumor cells (<0.2 mm): 0     Micrometastasis: (>0.2 mm and < 2.0 mm): 0     Macrometastasis: (>2.0 mm): 2     Extracapsular extension: Present, focal.  Representative Tumor Block: C3, C4, C5, C6 and C7.  MMR / MSI testing: Ordered.  Pathologic Stage Classification (pTNM, AJCC 8th edition): pT1b, pN1a  (v4.1.0.2)   Mismatch Repair Protein (IHC)   SUMMARY INTERPRETATION: ABNORMAL   There is loss of the major and minor MMR proteins MLH1 and PMS2. The  loss of expression may be secondary to promoter hyper-methylation, gene  mutation or other genetic event. BRAF mutation testing and/or MLH1  methylation testing is indicated. The presence of a BRAF mutation and/or  MLH1 hypermethylation is indicative of a sporadic-type tumor. The  absence of either BRAF mutation and/or presence of normal methylation  indicate the possible presence of a hereditary germline mutation (e.g.  Lynch syndrome) and referral to genetic counseling is warranted. It is  recommended that the loss of  protein expression be correlated with  molecular based MSI testing.   IHC EXPRESSION RESULTS   TEST      RESULT  MLH1:     LOSS OF NUCLEAR EXPRESSION  MSH2:     Preserved nuclear expression  MSH6:     Preserved nuclear expression  PMS2:     LOSS OF NUCLEAR EXPRESSION    CT C/A/P 12/2: IMPRESSION: 1. Recent hysterectomy and bilateral salpingo oophorectomy. No definite findings of metastatic disease in the chest, abdomen, or pelvis. There is  some trace fluid in the para colic gutters and central pelvis, presumably postsurgical. No omental or peritoneal nodularity evident at this time. 2. 9 mm hypodensity in the anterior right liver approaches water attenuation. While likely a cyst, close attention on follow-up recommended. 3. 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 4. Mild fullness right intrarenal collecting system without associated hydroureter. Component of underlying UPJ obstruction suspected. 5. Patchy areas of ground-glass attenuation in the posterior lower lobes bilaterally. This may be infectious/inflammatory. 6. Left colonic diverticulosis without diverticulitis. 7. Aortic Atherosclerosis (ICD10-I70.0).   Assessment & Plan: Stephanie Jarvis is a 68 y.o. woman with Stage IIIC1 endometrioid endometrial adenocarcinoma who presents for postoperative evaluation and treatment planning discussion.  Overall the patient is doing well from a postsurgical standpoint.  Her exam is reassuring and we discussed continued activity restrictions for the entire 6-8 weeks postoperatively.  She met with Dr. Delton Coombes yesterday to discuss moving forward with adjuvant carboplatin and paclitaxel.  Plan is for port placement and initiation of chemotherapy in the near future.  I gave the patient a printout of her recent CT scan as well as her pathology report.  She and I reviewed again findings on the CT scan.  There is no obvious metastatic disease although there is a borderline right external iliac lymph node that may represent metastasis, especially in the setting of known positive sentinel lymph nodes.  We discussed that this will be something that can be reevaluated after imaging which is typically repeated at the completion of therapy.  The patient has a good understanding of plan for 6 cycles of chemotherapy as well as vaginal brachytherapy.  I will reach out to [name], our nurse navigator, to assure that the appropriate coordination occurs to  get the patient into see radiation oncology closer to the time of planned treatment.  We reviewed plan for surveillance which will be 3 months visits for the first 2 years with an exam and imaging as indicated by symptomatology or exam findings.  The patient had loss of nuclear expression of MLH1 and PMS2 on IHC testing, MSI testing has not been performed nor has MLH1 hyper methylation testing.  The patient may require referral to genetics pending these results.  We also discussed again the role of unopposed estrogen in the pathogenesis of endometrial cancer.  I stressed the importance of good nutrition and weight loss.  Jeral Pinch, MD  Division of Gynecologic Oncology  Department of Obstetrics and Gynecology  [facility] of [facility]

## 2019-06-08 NOTE — Assessment & Plan Note (Addendum)
1.  Stage IIIC1 (pT1BpN1A) endometrioid adenocarcinoma: -She was having postmenopausal vaginal bleeding since July/August 2020. -She was evaluated by Dr. Glo Herring in October, endometrial biopsy on 04/22/2019 showed endometrioid adenocarcinoma. -She was evaluated by Dr. Berline Lopes and underwent laparoscopic TAH, BSO, lymph node biopsy on 05/18/2019. -Pathology showed endometrioid adenocarcinoma, 5.5 cm, carcinoma focally invades outer half of the myometrium, lymphovascular space involved by tumor, margins negative, metastatic adenocarcinoma in the right and left external iliac sentinel lymph nodes.  FIGO grade 1/2.  Depth of myometrial invasion 9 mm, myometrial thickness 12 mm.  Pathological stage is pT1b, PN 1A. -Loss of nuclear expression of MLH1 and PMS2 present. -I have recommended BRAF mutation testing and MLH1 hyper methylation testing. -Her case was discussed at tumor board and 6 cycles of carboplatin and paclitaxel followed by vaginal brachytherapy was recommended. -I have talked to her about results of CT CAP dated 06/02/2019 which did not show any evidence of metastatic disease.  0.9 cm hypodensity in the anterior right liver is consistent with a cyst. -We talked about the schedule and side effects of chemotherapy regimen.  I have recommended a port placement. -As she had both breast cancer and endometrial cancer, I have recommended geneticist evaluation for germline mutation testing.  2.  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  Oncotype DX score was high at 35.  She received 4 cycles of TC completed on 02/12/2007.  This was followed by adjuvant radiation therapy.  She took 5 years of anastrozole. -She had intermediate grade DCIS in the left breast in 2015 and underwent mastectomy.  She was again treated with 5 years of anastrozole which was completed in June 2020. -Right breast mammogram on 11/16/2018 was BI-RADS Category 1.  3.   Atrial fibrillation: -She is on Eliquis.  She is tolerating it very well.

## 2019-06-08 NOTE — Progress Notes (Signed)
Oncology Navigator Note:  I met with patient during the visit with Dr. Delton Coombes today. She was accompanied by her husband.  I explained to her how I am involved in her care.  I provided my contact information.  I provided her with written educational materials on the medications offered to her by Dr. Delton Coombes.  Her and her husband were both allowed time to ask questions and at this time all were answered to their satisfaction.    Per Dr. Delton Coombes, I have ordered BRAF and [facility] hypermethylation on accession # [id number].  I emailed pathology and received confirmation of receipt.

## 2019-06-08 NOTE — Progress Notes (Signed)
START ON PATHWAY REGIMEN - Uterine     A cycle is every 21 days:     Paclitaxel      Carboplatin   **Always confirm dose/schedule in your pharmacy ordering system**  Patient Characteristics: Endometrioid Histology, Newly Diagnosed, Resected, Stage IIIC1/IIIC2 - Grade 1, 2, or 3 Histology: Endometrioid Histology Therapeutic Status: Newly Diagnosed AJCC T Category: T1b AJCC N Category: N1a AJCC M Category: M0 AJCC 8 Stage Grouping: IIIC1 Surgical Status: Resected Intent of Therapy: Curative Intent, Discussed with Patient

## 2019-06-08 NOTE — Progress Notes (Signed)
[facility] CONSULT NOTE  Patient Care Team: [name], MD as PCP - General (Internal Medicine) Satira Sark, MD as PCP - Cardiology (Cardiology) Aviva Signs, MD as Consulting Physician (General Surgery)  CHIEF COMPLAINTS/PURPOSE OF CONSULTATION:  Newly diagnosed endometrioid adenocarcinoma of the uterus.  HISTORY OF PRESENTING ILLNESS:  Stephanie Jarvis 68 y.o. female is seen in consultation today at the request of Dr. Willy Eddy such for further management of stage III C1 endometrioid adenocarcinoma.  She started having postmenopausal vaginal bleeding since July/August of this year.  She was evaluated by Dr. Glo Herring and an endometrial biopsy on 04/22/2019 showed endometrioid adenocarcinoma.  She was referred to Dr. Berline Lopes who did laparoscopic TAH, BSO and lymph node biopsy on 05/18/2019.  She has recovered well from her surgery.  Pathology showed endometrioid adenocarcinoma, 5.5 cm, focally invading outer half of the myometrium.  Lymphovascular space involvement was positive.  Margins negative.  Metastatic adenocarcinoma in the right and left external iliac sentinel lymph nodes.  She also had loss of nuclear expression of MLH1 and PMS2.  She was evaluated by Dr. Berline Lopes and about 6 cycles of chemotherapy with carboplatin and paclitaxel followed by vaginal brachytherapy was recommended.  She underwent CT scans of the chest, abdomen and pelvis on 06/02/2019.  She is accompanied by her husband today.  She lives at home with her husband and is independent of doing household activities.  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  She smoked 2 packs/day for 15 years and quit 22 years ago.  Family history significant for maternal uncle with throat cancer.  She also had a history of stage I breast cancer in 2008 for which she received 4 cycles of TC and adjuvant radiation and anastrozole.  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  MEDICAL HISTORY:  Past Medical History:  Diagnosis Date  . Ankle pain   . Anxiety   . Arthritis   . Asthma    Seasonal asthma  . Breast cancer ([place]) 2008   ILC of Left breast; ER+  . Breast cancer ([place]) 2015   IDC+DCIS of Left breast; ER/PR+, Her2-, Ki67 = 4%  . Complication of anesthesia    Bad gag reflex  . Depression   . Dysrhythmia    A-fib  . Endometrial cancer ([place])   . GERD (gastroesophageal reflux disease)   . H/O hiatal hernia 2003  . History of atrial fibrillation 2011  . History of blood transfusion   . History of hepatitis A    Age 30  . History of pneumonia   . Hyperactive gag reflex   . Hyperlipidemia   . Hypertension   . Knee pain   . Obesity, Class III, BMI 40-49.9 (morbid obesity) ([place])   . Pneumonia   . Sciatica   . Sleep apnea    cpap  . Vertigo     SURGICAL HISTORY: Past Surgical History:  Procedure Laterality Date  . BREAST LUMPECTOMY Left 1982,2008   x 2   . CHOLECYSTECTOMY N/A 12/31/2016   Procedure: LAPAROSCOPIC CHOLECYSTECTOMY WITH POSSIBLE INTRAOPERATIVE CHOLANGIOGRAM;  Surgeon: Rolm Bookbinder, MD;  Location: [facility];  Service: General;  Laterality: N/A;  . COLONOSCOPY    . EYE SURGERY     bil cataract surgery  . HIATAL HERNIA REPAIR  2003  . Lymph node removal  2008  .  MASTECTOMY MODIFIED RADICAL Left 01/26/2014   Procedure: LEFT MODIFIED RADICAL MASTECTOMY;  Surgeon: Jamesetta So, MD;  Location: [facility];  Service: General;  Laterality: Left;  . PORT-A-CATH REMOVAL  2009 ish  . PORTA CATH INSERTION  [id number]  . ROBOTIC ASSISTED TOTAL HYSTERECTOMY WITH BILATERAL SALPINGO OOPHERECTOMY N/A 05/18/2019   Procedure: XI ROBOTIC ASSISTED TOTAL HYSTERECTOMY WITH BILATERAL SALPINGO OOPHORECTOMY;  Surgeon: Lafonda Mosses, MD;  Location: [facility];  Service: Gynecology;  Laterality: N/A;  . SENTINEL NODE BIOPSY N/A 05/18/2019   Procedure: SENTINEL  LYMPH NODE BIOPSY;  Surgeon: Lafonda Mosses, MD;  Location: [facility];  Service: Gynecology;  Laterality: N/A;  . TOTAL KNEE ARTHROPLASTY Right 2006  . TOTAL KNEE ARTHROPLASTY  08/12/2011   Procedure: TOTAL KNEE ARTHROPLASTY;  Surgeon: Arther Abbott, MD;  Location: [facility];  Service: Orthopedics;  Laterality: Left;  . TUBAL LIGATION  1980    SOCIAL HISTORY: Social History   Socioeconomic History  . Marital status: Widowed    Spouse name: Not on file  . Number of children: 2  . Years of education: 52  . Highest education level: Not on file  Occupational History  . Occupation: retired  Scientific laboratory technician  . Financial resource strain: Not on file  . Food insecurity    Worry: Not on file    Inability: Not on file  . Transportation needs    Medical: Not on file    Non-medical: Not on file  Tobacco Use  . Smoking status: Former Smoker    Packs/day: 3.00    Years: 25.00    Pack years: 75.00    Types: Cigarettes    Quit date: 06/30/1997    Years since quitting: 21.9  . Smokeless tobacco: Never Used  Substance and Sexual Activity  . Alcohol use: Yes    Alcohol/week: 0.0 standard drinks    Comment: 2 glasses wine per month  . Drug use: No  . Sexual activity: Not Currently    Birth control/protection: Surgical    Comment: tubal  Lifestyle  . Physical activity    Days per week: Not on file    Minutes per session: Not on file  . Stress: Not on file  Relationships  . Social Herbalist on phone: Not on file    Gets together: Not on file    Attends religious service: Not on file    Active member of club or organization: Not on file    Attends meetings of clubs or organizations: Not on file    Relationship status: Not on file  . Intimate partner violence    Fear of current or ex partner: Not on file    Emotionally abused: Not on file    Physically abused: Not on file    Forced sexual activity: Not on file  Other Topics Concern  . Not on file  Social History  Narrative   Drinks 2-3 cups of coffee a day     FAMILY HISTORY: Family History  Problem Relation Age of Onset  . Stroke Father   . Heart disease Father   . Skin cancer Sister        dx. 61-58; unknown type  . Throat cancer Maternal Uncle 75       smoker; voicebox removed  . Alzheimer's disease Paternal Grandmother   . Heart attack Paternal Grandfather   . Alzheimer's disease Maternal Aunt   . Heart disease Maternal Uncle   . Lung cancer Cousin  smoker  . Lung cancer Paternal Aunt        dx. mid-70s; worked at [employer]  . Heart attack Daughter        caused by thyroid issues  . Heart disease Other   . Arthritis Other   . Lung disease Other   . Cancer Other   . Kidney disease Other   . Colon cancer Other   . Ovarian cancer Other   . Endometrial cancer Other     ALLERGIES:  has No Known Allergies.  MEDICATIONS:  Current Outpatient Medications  Medication Sig Dispense Refill  . apixaban (ELIQUIS) 5 MG TABS tablet Take 1 tablet (5 mg total) by mouth 2 (two) times daily. 180 tablet 3  . buPROPion (WELLBUTRIN XL) 300 MG 24 hr tablet Take 300 mg by mouth daily.    . Calcium Carbonate-Vit D-Min (CALCIUM 1200 PO) Take 1 tablet by mouth daily.     . fexofenadine (ALLEGRA) 180 MG tablet Take 180 mg by mouth daily.    . furosemide (LASIX) 40 MG tablet Take 1 tablet (40 mg total) by mouth 2 (two) times daily. 180 tablet 3  . metoprolol tartrate (LOPRESSOR) 25 MG tablet Take 1 tablet (25 mg total) by mouth 2 (two) times daily. 180 tablet 3  . omeprazole (PRILOSEC) 20 MG capsule Take 20 mg by mouth 2 (two) times daily.     . potassium chloride SA (KLOR-CON M20) 20 MEQ tablet Take 2 tablets (40 mEq total) by mouth every morning. & 20 meq in the evening (Patient taking differently: Take 20 mEq by mouth 2 (two) times daily. ) 180 tablet 3  . rosuvastatin (CRESTOR) 20 MG tablet Take 20 mg by mouth at bedtime.     . sertraline (ZOLOFT) 100 MG tablet Take 100 mg by mouth at  bedtime.    Marland Kitchen albuterol (PROVENTIL) (2.5 MG/3ML) 0.083% nebulizer solution Take 3 mLs by nebulization 4 (four) times daily as needed for wheezing or shortness of breath.     Marland Kitchen albuterol (VENTOLIN HFA) 108 (90 Base) MCG/ACT inhaler Inhale 1-2 puffs into the lungs every 6 (six) hours as needed for wheezing or shortness of breath.    . baclofen (LIORESAL) 20 MG tablet Take 20 mg by mouth 2 (two) times daily as needed for muscle spasms.     Marland Kitchen ibuprofen (ADVIL) 600 MG tablet Take 1 tablet (600 mg total) by mouth every 6 (six) hours as needed for moderate pain. For AFTER surgery (Patient not taking: Reported on 06/08/2019) 30 tablet 1  . senna-docusate (SENOKOT-S) 8.6-50 MG tablet Take 2 tablets by mouth at bedtime. For AFTER surgery, do not take if having diarrhea (Patient not taking: Reported on 06/08/2019) 60 tablet 1   Current Facility-Administered Medications  Medication Dose Route Frequency Provider Last Rate Last Dose  . influenza vaccine adjuvanted (FLUAD) injection 0.5 mL  0.5 mL Intramuscular Once Derek Jack, MD        REVIEW OF SYSTEMS:   Constitutional: Denies fevers, chills or abnormal night sweats Eyes: Denies blurriness of vision, double vision or watery eyes Ears, nose, mouth, throat, and face: Denies mucositis or sore throat Respiratory: Denies cough, dyspnea or wheezes Cardiovascular: Denies palpitation, chest discomfort or lower extremity swelling Gastrointestinal:  Denies nausea, heartburn or change in bowel habits Skin: Denies abnormal skin rashes Lymphatics: Denies new lymphadenopathy or easy bruising Neurological:Denies numbness, tingling or new weaknesses Behavioral/Psych: Mood is stable, no new changes  All other systems were reviewed with the patient and are  negative.  PHYSICAL EXAMINATION: ECOG PERFORMANCE STATUS: 1 - Symptomatic but completely ambulatory  Vitals:   06/08/19 0945  BP: (!) 142/70  Pulse: 73  Resp: 20  Temp: (!) 96.7 F (35.9 C)  SpO2:  97%   Filed Weights   06/08/19 0945  Weight: 264 lb 8 oz (120 kg)    GENERAL:alert, no distress and comfortable SKIN: skin color, texture, turgor are normal, no rashes or significant lesions EYES: normal, conjunctiva are pink and non-injected, sclera clear OROPHARYNX:no exudate, no erythema and lips, buccal mucosa, and tongue normal  NECK: supple, thyroid normal size, non-tender, without nodularity LYMPH:  no palpable lymphadenopathy in the cervical, axillary or inguinal LUNGS: clear to auscultation and percussion with normal breathing effort HEART: regular rate & rhythm and no murmurs and no lower extremity edema ABDOMEN:abdomen soft, non-tender and normal bowel sounds.  Laparoscopic surgical wounds have healed well. Musculoskeletal:no cyanosis of digits and no clubbing  PSYCH: alert & oriented x 3 with fluent speech NEURO: no focal motor/sensory deficits  LABORATORY DATA:  I have reviewed the data as listed Lab Results  Component Value Date   WBC 8.8 05/13/2019   HGB 12.2 05/13/2019   HCT 37.1 05/13/2019   MCV 93.7 05/13/2019   PLT 283 05/13/2019     Chemistry      Component Value Date/Time   NA 141 05/13/2019 1407   K 3.9 05/13/2019 1407   CL 102 05/13/2019 1407   CO2 27 05/13/2019 1407   BUN 10 05/13/2019 1407   CREATININE 0.75 05/13/2019 1407      Component Value Date/Time   CALCIUM 9.2 05/13/2019 1407   ALKPHOS 80 05/13/2019 1407   AST 23 05/13/2019 1407   ALT 23 05/13/2019 1407   BILITOT 0.8 05/13/2019 1407       RADIOGRAPHIC STUDIES: I have personally reviewed the radiological images as listed and agreed with the findings in the report. Ct Chest W Contrast  Result Date: 06/02/2019 CLINICAL DATA:  Uterine cancer. Status post total hysterectomy with bilateral salpingo oophorectomy on 05/18/2019. EXAM: CT CHEST, ABDOMEN, AND PELVIS WITH CONTRAST TECHNIQUE: Multidetector CT imaging of the chest, abdomen and pelvis was performed following the standard protocol  during bolus administration of intravenous contrast. CONTRAST:  155m OMNIPAQUE IOHEXOL 300 MG/ML  SOLN COMPARISON:  None. FINDINGS: CT CHEST FINDINGS Cardiovascular: The heart size is normal. Small pericardial effusion noted. Coronary artery calcification is evident. Atherosclerotic calcification is noted in the wall of the thoracic aorta. Mediastinum/Nodes: No mediastinal lymphadenopathy. There is no hilar lymphadenopathy. The esophagus has normal imaging features. Small associated paraesophageal or hiatal hernia noted. There is no axillary lymphadenopathy. Lungs/Pleura: Patchy areas of ground-glass attenuation are seen in the posterior lower lobes bilaterally. No overtly suspicious pulmonary nodule or mass. No pleural effusion. Musculoskeletal: No worrisome lytic or sclerotic osseous abnormality. Status post left mastectomy. Subcutaneous gas is noted in the lower right anterolateral chest wall. CT ABDOMEN PELVIS FINDINGS Hepatobiliary: 9 mm hypodensity in the anterior right liver (56/2) shows no definite enhancement but cannot be fully characterized. This lesion approaches water attenuation and is likely a cyst. Gallbladder is surgically absent. No intrahepatic or extrahepatic biliary dilation. Pancreas: No focal mass lesion. No dilatation of the main duct. No intraparenchymal cyst. No peripancreatic edema. Spleen: No splenomegaly. No focal mass lesion. Adrenals/Urinary Tract: No adrenal nodule or mass. Mild fullness noted right intrarenal collecting system without associated hydroureter. Left kidney unremarkable. No evidence for hydroureter. The urinary bladder appears normal for the degree of distention.  Stomach/Bowel: As noted above, proximal stomach is herniated up through the diaphragmatic hiatus. Gastroesophageal junction is difficult to identify but appears to be below the hiatus suggesting that this is a paraesophageal hernia. Stomach otherwise unremarkable. Duodenum is normally positioned as is the  ligament of Treitz. Duodenal diverticulum noted. No small bowel wall thickening. No small bowel dilatation. The terminal ileum is normal. The appendix is normal. No gross colonic mass. No colonic wall thickening. Diverticular changes are noted in the left colon without evidence of diverticulitis. Vascular/Lymphatic: There is abdominal aortic atherosclerosis without aneurysm. There is no gastrohepatic or hepatoduodenal ligament lymphadenopathy. No intraperitoneal or retroperitoneal lymphadenopathy. No pelvic sidewall lymphadenopathy. Right external iliac lymph node seen on 90/2 is 10 mm short axis, upper normal. Reproductive: Uterus surgically absent.  There is no adnexal mass. Other: Trace free fluid noted in the paracolic gutter bilaterally. There is some trace fluid in the pelvis as well. No omental nodularity. No thickening or nodularity of the peritoneum evident at this time. Musculoskeletal: No worrisome lytic or sclerotic osseous abnormality. Gas is identified in the subcutaneous soft tissues of the right abdominal wall. IMPRESSION: 1. Recent hysterectomy and bilateral salpingo oophorectomy. No definite findings of metastatic disease in the chest, abdomen, or pelvis. There is some trace fluid in the para colic gutters and central pelvis, presumably postsurgical. No omental or peritoneal nodularity evident at this time. 2. 9 mm hypodensity in the anterior right liver approaches water attenuation. While likely a cyst, close attention on follow-up recommended. 3. 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 4. Mild fullness right intrarenal collecting system without associated hydroureter. Component of underlying UPJ obstruction suspected. 5. Patchy areas of ground-glass attenuation in the posterior lower lobes bilaterally. This may be infectious/inflammatory. 6. Left colonic diverticulosis without diverticulitis. 7. Aortic Atherosclerosis (ICD10-I70.0). 1. Electronically Signed   By: Misty Stanley M.D.   On: 06/02/2019  15:13   Ct Abdomen Pelvis W Contrast  Result Date: 06/02/2019 CLINICAL DATA:  Uterine cancer. Status post total hysterectomy with bilateral salpingo oophorectomy on 05/18/2019. EXAM: CT CHEST, ABDOMEN, AND PELVIS WITH CONTRAST TECHNIQUE: Multidetector CT imaging of the chest, abdomen and pelvis was performed following the standard protocol during bolus administration of intravenous contrast. CONTRAST:  17m OMNIPAQUE IOHEXOL 300 MG/ML  SOLN COMPARISON:  None. FINDINGS: CT CHEST FINDINGS Cardiovascular: The heart size is normal. Small pericardial effusion noted. Coronary artery calcification is evident. Atherosclerotic calcification is noted in the wall of the thoracic aorta. Mediastinum/Nodes: No mediastinal lymphadenopathy. There is no hilar lymphadenopathy. The esophagus has normal imaging features. Small associated paraesophageal or hiatal hernia noted. There is no axillary lymphadenopathy. Lungs/Pleura: Patchy areas of ground-glass attenuation are seen in the posterior lower lobes bilaterally. No overtly suspicious pulmonary nodule or mass. No pleural effusion. Musculoskeletal: No worrisome lytic or sclerotic osseous abnormality. Status post left mastectomy. Subcutaneous gas is noted in the lower right anterolateral chest wall. CT ABDOMEN PELVIS FINDINGS Hepatobiliary: 9 mm hypodensity in the anterior right liver (56/2) shows no definite enhancement but cannot be fully characterized. This lesion approaches water attenuation and is likely a cyst. Gallbladder is surgically absent. No intrahepatic or extrahepatic biliary dilation. Pancreas: No focal mass lesion. No dilatation of the main duct. No intraparenchymal cyst. No peripancreatic edema. Spleen: No splenomegaly. No focal mass lesion. Adrenals/Urinary Tract: No adrenal nodule or mass. Mild fullness noted right intrarenal collecting system without associated hydroureter. Left kidney unremarkable. No evidence for hydroureter. The urinary bladder appears  normal for the degree of distention. Stomach/Bowel: As noted above, proximal  stomach is herniated up through the diaphragmatic hiatus. Gastroesophageal junction is difficult to identify but appears to be below the hiatus suggesting that this is a paraesophageal hernia. Stomach otherwise unremarkable. Duodenum is normally positioned as is the ligament of Treitz. Duodenal diverticulum noted. No small bowel wall thickening. No small bowel dilatation. The terminal ileum is normal. The appendix is normal. No gross colonic mass. No colonic wall thickening. Diverticular changes are noted in the left colon without evidence of diverticulitis. Vascular/Lymphatic: There is abdominal aortic atherosclerosis without aneurysm. There is no gastrohepatic or hepatoduodenal ligament lymphadenopathy. No intraperitoneal or retroperitoneal lymphadenopathy. No pelvic sidewall lymphadenopathy. Right external iliac lymph node seen on 90/2 is 10 mm short axis, upper normal. Reproductive: Uterus surgically absent.  There is no adnexal mass. Other: Trace free fluid noted in the paracolic gutter bilaterally. There is some trace fluid in the pelvis as well. No omental nodularity. No thickening or nodularity of the peritoneum evident at this time. Musculoskeletal: No worrisome lytic or sclerotic osseous abnormality. Gas is identified in the subcutaneous soft tissues of the right abdominal wall. IMPRESSION: 1. Recent hysterectomy and bilateral salpingo oophorectomy. No definite findings of metastatic disease in the chest, abdomen, or pelvis. There is some trace fluid in the para colic gutters and central pelvis, presumably postsurgical. No omental or peritoneal nodularity evident at this time. 2. 9 mm hypodensity in the anterior right liver approaches water attenuation. While likely a cyst, close attention on follow-up recommended. 3. 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 4. Mild fullness right intrarenal collecting system without associated  hydroureter. Component of underlying UPJ obstruction suspected. 5. Patchy areas of ground-glass attenuation in the posterior lower lobes bilaterally. This may be infectious/inflammatory. 6. Left colonic diverticulosis without diverticulitis. 7. Aortic Atherosclerosis (ICD10-I70.0). 1. Electronically Signed   By: Misty Stanley M.D.   On: 06/02/2019 15:13    ASSESSMENT & PLAN:  Endometrial cancer determined by uterine biopsy ([place]) 1.  Stage IIIC1 (pT1BpN1A) endometrioid adenocarcinoma: -She was having postmenopausal vaginal bleeding since July/August 2020. -She was evaluated by Dr. Glo Herring in October, endometrial biopsy on 04/22/2019 showed endometrioid adenocarcinoma. -She was evaluated by Dr. Berline Lopes and underwent laparoscopic TAH, BSO, lymph node biopsy on 05/18/2019. -Pathology showed endometrioid adenocarcinoma, 5.5 cm, carcinoma focally invades outer half of the myometrium, lymphovascular space involved by tumor, margins negative, metastatic adenocarcinoma in the right and left external iliac sentinel lymph nodes.  FIGO grade 1/2.  Depth of myometrial invasion 9 mm, myometrial thickness 12 mm.  Pathological stage is pT1b, PN 1A. -Loss of nuclear expression of MLH1 and PMS2 present. -I have recommended BRAF mutation testing and MLH1 hyper methylation testing. -Her case was discussed at tumor board and 6 cycles of carboplatin and paclitaxel followed by vaginal brachytherapy was recommended. -I have talked to her about results of CT CAP dated 06/02/2019 which did not show any evidence of metastatic disease.  0.9 cm hypodensity in the anterior right liver is consistent with a cyst. -We talked about the schedule and side effects of chemotherapy regimen.  I have recommended a port placement. -As she had both breast cancer and endometrial cancer, I have recommended geneticist evaluation for germline mutation testing.  2.  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  Oncotype DX score was high at 35.  She received 4 cycles of TC completed on 02/12/2007.  This was followed by adjuvant radiation therapy.  She took 5 years of anastrozole. -She had intermediate grade  DCIS in the left breast in 2015 and underwent mastectomy.  She was again treated with 5 years of anastrozole which was completed in June 2020. -Right breast mammogram on 11/16/2018 was BI-RADS Category 1.  3.  Atrial fibrillation: -She is on Eliquis.  She is tolerating it very well.  No orders of the defined types were placed in this encounter.   All questions were answered. The patient knows to call the clinic with any problems, questions or concerns.      Derek Jack, MD 06/08/2019 5:59 PM

## 2019-06-08 NOTE — Patient Instructions (Signed)
[place] at [facility] Discharge Instructions  You were seen today by Dr. Delton Coombes. He went over your history, family history and how you've been feeling since your surgery. He will schedule you for a port-a-cath placement. He will see you back in 2 weeks for labs, treatment and follow up.   Thank you for choosing [place] at [facility] to provide your oncology and hematology care.  To afford each patient quality time with our provider, please arrive at least 15 minutes before your scheduled appointment time.   If you have a lab appointment with the [facility] please come in thru the  Main Entrance and check in at the main information desk  You need to re-schedule your appointment should you arrive 10 or more minutes late.  We strive to give you quality time with our providers, and arriving late affects you and other patients whose appointments are after yours.  Also, if you no show three or more times for appointments you may be dismissed from the clinic at the providers discretion.     Again, thank you for choosing [facility].  Our hope is that these requests will decrease the amount of time that you wait before being seen by our physicians.       _____________________________________________________________  Should you have questions after your visit to [facility], please contact our office at [phone] between the hours of 8:00 a.m. and 4:30 p.m.  Voicemails left after 4:00 p.m. will not be returned until the following business day.  For prescription refill requests, have your pharmacy contact our office and allow 72 hours.    Cancer Center Support Programs:   > Cancer Support Group  2nd Tuesday of the month 1pm-2pm, Journey Room

## 2019-06-09 ENCOUNTER — Other Ambulatory Visit: Payer: Self-pay

## 2019-06-09 ENCOUNTER — Encounter: Payer: Self-pay | Admitting: Gynecologic Oncology

## 2019-06-09 ENCOUNTER — Inpatient Hospital Stay: Payer: Medicare Other | Attending: Gynecologic Oncology | Admitting: Gynecologic Oncology

## 2019-06-09 VITALS — BP 144/88 | HR 74 | Temp 98.0°F | Resp 18 | Ht 65.0 in | Wt 263.0 lb

## 2019-06-09 DIAGNOSIS — M5430 Sciatica, unspecified side: Secondary | ICD-10-CM | POA: Insufficient documentation

## 2019-06-09 DIAGNOSIS — C775 Secondary and unspecified malignant neoplasm of intrapelvic lymph nodes: Secondary | ICD-10-CM | POA: Insufficient documentation

## 2019-06-09 DIAGNOSIS — Z90710 Acquired absence of both cervix and uterus: Secondary | ICD-10-CM | POA: Insufficient documentation

## 2019-06-09 DIAGNOSIS — Z9012 Acquired absence of left breast and nipple: Secondary | ICD-10-CM | POA: Insufficient documentation

## 2019-06-09 DIAGNOSIS — F419 Anxiety disorder, unspecified: Secondary | ICD-10-CM | POA: Insufficient documentation

## 2019-06-09 DIAGNOSIS — Z87891 Personal history of nicotine dependence: Secondary | ICD-10-CM | POA: Insufficient documentation

## 2019-06-09 DIAGNOSIS — I4891 Unspecified atrial fibrillation: Secondary | ICD-10-CM | POA: Insufficient documentation

## 2019-06-09 DIAGNOSIS — K219 Gastro-esophageal reflux disease without esophagitis: Secondary | ICD-10-CM | POA: Insufficient documentation

## 2019-06-09 DIAGNOSIS — Z9221 Personal history of antineoplastic chemotherapy: Secondary | ICD-10-CM | POA: Insufficient documentation

## 2019-06-09 DIAGNOSIS — Z79899 Other long term (current) drug therapy: Secondary | ICD-10-CM | POA: Insufficient documentation

## 2019-06-09 DIAGNOSIS — E669 Obesity, unspecified: Secondary | ICD-10-CM | POA: Insufficient documentation

## 2019-06-09 DIAGNOSIS — Z6841 Body Mass Index (BMI) 40.0 and over, adult: Secondary | ICD-10-CM | POA: Insufficient documentation

## 2019-06-09 DIAGNOSIS — C541 Malignant neoplasm of endometrium: Secondary | ICD-10-CM | POA: Insufficient documentation

## 2019-06-09 DIAGNOSIS — Z90722 Acquired absence of ovaries, bilateral: Secondary | ICD-10-CM

## 2019-06-09 DIAGNOSIS — I10 Essential (primary) hypertension: Secondary | ICD-10-CM | POA: Insufficient documentation

## 2019-06-09 DIAGNOSIS — E785 Hyperlipidemia, unspecified: Secondary | ICD-10-CM | POA: Insufficient documentation

## 2019-06-09 DIAGNOSIS — F329 Major depressive disorder, single episode, unspecified: Secondary | ICD-10-CM | POA: Insufficient documentation

## 2019-06-09 DIAGNOSIS — Z853 Personal history of malignant neoplasm of breast: Secondary | ICD-10-CM | POA: Insufficient documentation

## 2019-06-09 DIAGNOSIS — M1990 Unspecified osteoarthritis, unspecified site: Secondary | ICD-10-CM | POA: Insufficient documentation

## 2019-06-09 NOTE — Patient Instructions (Signed)
You are doing great!  Your exam was very reassuring today.  Remember continued activity restriction, including no heavy lifting and nothing in the vagina, for the full 6-8 weeks after surgery.  I will see you for follow-up once you have completed chemotherapy and vaginal brachytherapy.  Please call our clinic with any issues, such as vaginal bleeding or discharge, at [phone] prior to your next visit.

## 2019-06-10 ENCOUNTER — Encounter (HOSPITAL_COMMUNITY): Payer: Self-pay | Admitting: Hematology

## 2019-06-10 ENCOUNTER — Telehealth: Payer: Self-pay | Admitting: Oncology

## 2019-06-10 NOTE — Telephone Encounter (Signed)
Stephanie Jarvis and discussed that we will schedule her appointment with Radiation Oncology - Dr. Sondra Come, towards the end of finishing chemotherapy. Advised her that I will follow her chemotherapy appointments and will call her to schedule closer to the end of chemo.   Also discussed that the genetics appointment for Monday has been changed to a virtual visit.  She verbalized understanding and agreement.

## 2019-06-14 ENCOUNTER — Encounter: Payer: Self-pay | Admitting: Licensed Clinical Social Worker

## 2019-06-14 ENCOUNTER — Encounter: Payer: Medicare Other | Admitting: Licensed Clinical Social Worker

## 2019-06-14 ENCOUNTER — Inpatient Hospital Stay: Payer: Medicare Other | Admitting: Licensed Clinical Social Worker

## 2019-06-14 ENCOUNTER — Other Ambulatory Visit: Payer: Medicare Other

## 2019-06-14 DIAGNOSIS — Z170 Estrogen receptor positive status [ER+]: Secondary | ICD-10-CM

## 2019-06-14 DIAGNOSIS — Z808 Family history of malignant neoplasm of other organs or systems: Secondary | ICD-10-CM

## 2019-06-14 DIAGNOSIS — C541 Malignant neoplasm of endometrium: Secondary | ICD-10-CM

## 2019-06-14 DIAGNOSIS — C50112 Malignant neoplasm of central portion of left female breast: Secondary | ICD-10-CM

## 2019-06-14 DIAGNOSIS — Z801 Family history of malignant neoplasm of trachea, bronchus and lung: Secondary | ICD-10-CM

## 2019-06-14 NOTE — Progress Notes (Signed)
REFERRING PROVIDER: Dorothyann Gibbs, NP [place],  [place] [address]  PRIMARY PROVIDER:  [name], MD  PRIMARY REASON FOR VISIT:  1. Endometrial cancer determined by uterine biopsy ([place])   2. Malignant neoplasm of central portion of left breast in female, estrogen receptor positive ([place])   3. Family history of lung cancer   4. Family history of skin cancer    I connected with Stephanie Jarvis on 06/14/2019 at 11:00 AM EDT by MyChart video conference and verified that I am speaking with the correct person using two identifiers.    Patient location: home Provider location: home   HISTORY OF PRESENT ILLNESS:   Ms. Pelphrey, a 68 y.o. female, was seen for a [facility] cancer genetics consultation at the request of Joylene John, NP to a personal and family history of cancer.  Ms. Keniston presents to clinic today to discuss the possibility of a hereditary predisposition to cancer, genetic testing, and to further clarify her future cancer risks, as well as potential cancer risks for family members.   At the age of 25, Ms. Eden was diagnosed with ILC of the left breast, this was treated with chemotherapy radiation and partial mastectomy.   In 2015, at the age of 75, Ms. Chumley was diagnosed with IDC+DCIS of the left breast, Er/PR+, Her2-. This was treated with mastectomy. Patient had genetic counseling and no testing at the time.  In 2020 at the age of 49, Ms. Labuda was diagnosed with endometrial cancer. This was treated with TRH-BSO and she will have chemotherapy and radiation. Her tumor showed loss of MLH1 and PMS2, hypermethylation present, indicating this to be a likely sporadic cancer.     CANCER HISTORY:  Oncology History  Breast cancer ([place])  12/08/2013 Procedure   Left needle core biopsy   12/09/2013 Pathology Results   Invasive ductal carcinoma with DCIS, ER 100%, PR 31%, Ki-67 marker 4%.  Insufficient material for HER2 testing.   12/28/2013 Breast MRI   Post  biopsy changes located within the left breast laterally (upper-outer quadrant) related to the patient's recent stereotactic biopsy. Also, postsurgical scarring changes within the left breast. No areas of worrisome enhancement within the right breast.   01/26/2014 Definitive Surgery   Left modified radical mastectomy by Dr. Arnoldo Morale   02/01/2014 Pathology Results   Intermediate grade DCIS, 0.5 cm, 0/6 lymph nodes, negative resection margins.   03/13/2016 Imaging   Bone density- BMD as determined from Fem[facility] Neck Right is 1.023 g/cm2 with a T-Score of -0.1. This patient is considered normal according to [facility]) criteria.   Endometrial cancer determined by uterine biopsy ([facility])  04/22/2019 Initial Biopsy   EMB - Gr1 EMCA   04/28/2019 Initial Diagnosis   Endometrial cancer determined by uterine biopsy ([place])   05/18/2019 Surgery   TRH/BSO, bilateral SLNs SLNs bulky.    Pathologic Stage   IIIC1, +LVSI, outer half MI, endometrioid adenoca, G 1-2   06/29/2019 -  Chemotherapy   The patient had PALONOSETRON HCL INJECTION 0.25 MG/5ML, 0.25 mg, Intravenous,  Once, 0 of 6 cycles pegfilgrastim-jmdb (FULPHILA) injection 6 mg, 6 mg, Subcutaneous,  Once, 0 of 6 cycles CARBOplatin (PARAPLATIN) in sodium chloride 0.9 % 100 mL chemo infusion, , Intravenous,  Once, 0 of 6 cycles FOSAPREPITANT IV INFUSION 150 MG, 150 mg, Intravenous,  Once, 0 of 6 cycles PACLitaxel (TAXOL) 414 mg in sodium chloride 0.9 % 500 mL chemo infusion (> 56m/m2), 175 mg/m2, Intravenous,  Once, 0 of  6 cycles  for chemotherapy treatment.       RISK FACTORS:  Menarche was at age 78.  First live birth at age 83.  OCP use for approximately 2 years.  Ovaries intact: no.  Hysterectomy: yes.  Menopausal status: postmenopausal.  HRT use: 0 years. Colonoscopy: yes; normal. Mammogram within the last year: yes. Number of breast biopsies: 3. Up to date with pelvic exams: yes. Any excessive radiation exposure  in the past: no  Past Medical History:  Diagnosis Date  . Ankle pain   . Anxiety   . Arthritis   . Asthma    Seasonal asthma  . Breast cancer ([facility]) 2008   ILC of Left breast; ER+  . Breast cancer ([facility]) 2015   IDC+DCIS of Left breast; ER/PR+, Her2-, Ki67 = 4%  . Complication of anesthesia    Bad gag reflex  . Depression   . Dysrhythmia    A-fib  . Endometrial cancer ([facility])   . GERD (gastroesophageal reflux disease)   . H/O hiatal hernia 2003  . History of atrial fibrillation 2011  . History of blood transfusion   . History of hepatitis A    Age 28  . History of pneumonia   . Hyperactive gag reflex   . Hyperlipidemia   . Hypertension   . Knee pain   . Obesity, Class III, BMI 40-49.9 (morbid obesity) ([place])   . Pneumonia   . Sciatica   . Sleep apnea    cpap  . Vertigo     Past Surgical History:  Procedure Laterality Date  . BREAST LUMPECTOMY Left 1982,2008   x 2   . CHOLECYSTECTOMY N/A 12/31/2016   Procedure: LAPAROSCOPIC CHOLECYSTECTOMY WITH POSSIBLE INTRAOPERATIVE CHOLANGIOGRAM;  Surgeon: Rolm Bookbinder, MD;  Location: [facility];  Service: General;  Laterality: N/A;  . COLONOSCOPY    . EYE SURGERY     bil cataract surgery  . HIATAL HERNIA REPAIR  2003  . Lymph node removal  2008  . MASTECTOMY MODIFIED RADICAL Left 01/26/2014   Procedure: LEFT MODIFIED RADICAL MASTECTOMY;  Surgeon: Jamesetta So, MD;  Location: [facility];  Service: General;  Laterality: Left;  . PORT-A-CATH REMOVAL  2009 ish  . PORTA CATH INSERTION  [id number]  . ROBOTIC ASSISTED TOTAL HYSTERECTOMY WITH BILATERAL SALPINGO OOPHERECTOMY N/A 05/18/2019   Procedure: XI ROBOTIC ASSISTED TOTAL HYSTERECTOMY WITH BILATERAL SALPINGO OOPHORECTOMY;  Surgeon: Lafonda Mosses, MD;  Location: [facility];  Service: Gynecology;  Laterality: N/A;  . SENTINEL NODE BIOPSY N/A 05/18/2019   Procedure: SENTINEL LYMPH NODE BIOPSY;  Surgeon: Lafonda Mosses, MD;  [facility];  Service: Gynecology;  Laterality: N/A;  .  TOTAL KNEE ARTHROPLASTY Right 2006  . TOTAL KNEE ARTHROPLASTY  08/12/2011   Procedure: TOTAL KNEE ARTHROPLASTY;  Surgeon: Arther Abbott, MD;  Location: [facility];  Service: Orthopedics;  Laterality: Left;  . TUBAL LIGATION  1980    Social History   Socioeconomic History  . Marital status: Widowed    Spouse name: Not on file  . Number of children: 2  . Years of education: 46  . Highest education level: Not on file  Occupational History  . Occupation: retired  Tobacco Use  . Smoking status: Former Smoker    Packs/day: 3.00    Years: 25.00    Pack years: 75.00    Types: Cigarettes    Quit date: 06/30/1997    Years since quitting: 21.9  . Smokeless tobacco: Never Used  Substance and Sexual Activity  .  Alcohol use: Yes    Alcohol/week: 0.0 standard drinks    Comment: 2 glasses wine per month  . Drug use: No  . Sexual activity: Not Currently    Birth control/protection: Surgical    Comment: tubal  Other Topics Concern  . Not on file  Social History Narrative   Drinks 2-3 cups of coffee a day    Social Determinants of Health   Financial Resource Strain:   . Difficulty of Paying Living Expenses: Not on file  Food Insecurity:   . Worried About Charity fundraiser in the Last Year: Not on file  . Ran Out of Food in the Last Year: Not on file  Transportation Needs:   . Lack of Transportation (Medical): Not on file  . Lack of Transportation (Non-Medical): Not on file  Physical Activity:   . Days of Exercise per Week: Not on file  . Minutes of Exercise per Session: Not on file  Stress:   . Feeling of Stress : Not on file  Social Connections:   . Frequency of Communication with Friends and Family: Not on file  . Frequency of Social Gatherings with Friends and Family: Not on file  . Attends Religious Services: Not on file  . Active Member of Clubs or Organizations: Not on file  . Attends Archivist Meetings: Not on file  . Marital Status: Not on file     FAMILY  HISTORY:  We obtained a detailed, 4-generation family history.  Significant diagnoses are listed below: Family History  Problem Relation Age of Onset  . Stroke Father   . Heart disease Father   . Skin cancer Sister        dx. 32-58; unknown type  . Throat cancer Maternal Uncle 75       smoker; voicebox removed  . Alzheimer's disease Paternal Grandmother   . Heart attack Paternal Grandfather   . Alzheimer's disease Maternal Aunt   . Heart disease Maternal Uncle   . Lung cancer Cousin        smoker  . Lung cancer Paternal Aunt        dx. mid-70s; worked at [employer]  . Heart attack Daughter        caused by thyroid issues  . Heart disease Other   . Arthritis Other   . Lung disease Other   . Cancer Other   . Kidney disease Other   . Colon cancer Other   . Ovarian cancer Other   . Endometrial cancer Other    Ms. Laidlaw had 2 daughters, one passed away at 33, no cancer. Ms. Fein has 1 sister who has had skin cancer, patient unsure the type. Patient also has 2 nephews, no cancer history though one nephew did have a tumor removed from his back that she believes was benign.  Ms. Mikaelian's mother died by suicide at 35. Patient had 3 maternal aunts, 5 maternal uncles. One of her uncles had throat cancer. A maternal cousin had lung cancer. Maternal grandmother died at 45 due to heart problems, maternal grandfather died at 51, unsure the cause.  Ms. Chittenden's father died at 35 due to heart disease, no cancers. Patient had 2 paternal aunts. One aunt had lung cancer. No known cancers in paternal cousins. Paternal grandmother died at 13 and paternal grandfather died at 77, no cancers.  Ms. Ramthun is unaware of previous family history of genetic testing for hereditary cancer risks. Patient's maternal ancestors are of [place] descent, and  paternal ancestors are of [place] descent. There is no reported Ashkenazi Jewish ancestry. There is known consanguinity, parents were second  cousins.  GENETIC COUNSELING ASSESSMENT: Ms. Standlee is a 68 y.o. female with a personal history which is somewhat suggestive of a hereditary cancer syndrome and predisposition to cancer. We, therefore, discussed and recommended the following at today's visit.   DISCUSSION: We discussed that 5 - 10% of breast cancer is hereditary, with most cases associated with BRCA1/BRCA2 mutations.  There are other genes that can be associated with hereditary  cancer syndromes, including genes that increase the risk for endometrial cancer.  These include Lynch syndrome genes, which we also discussed given her tumor testing.  We discussed that based on the hypermethylation that was present, it is unlikely she has Lynch syndrome.  We discussed that testing is beneficial for several reasons including knowing how to follow individuals after completing their treatment, and understand if other family members could be at risk for cancer and allow them to undergo genetic testing.   We reviewed the characteristics, features and inheritance patterns of hereditary cancer syndromes. We also discussed genetic testing, including the appropriate family members to test, the process of testing, insurance coverage and turn-around-time for results. We discussed the implications of a negative, positive and/or variant of uncertain significant result. We recommended Ms. Desouza pursue genetic testing for the [facility] Common Hereditary Cancers gene panel.   The Common Hereditary Cancers Panel offered by [facility] includes sequencing and/or deletion duplication testing of the following 48 genes: APC, ATM, AXIN2, BARD1, BMPR1A, BRCA1, BRCA2, BRIP1, CDH1, CDKN2A (p14ARF), CDKN2A (p16INK4a), CKD4, CHEK2, CTNNA1, DICER1, EPCAM (Deletion/duplication testing only), GREM1 (promoter region deletion/duplication testing only), KIT, MEN1, MLH1, MSH2, MSH3, MSH6, MUTYH, NBN, NF1, NHTL1, PALB2, PDGFRA, PMS2, POLD1, POLE, PTEN, RAD50, RAD51C, RAD51D, RNF43,  SDHB, SDHC, SDHD, SMAD4, SMARCA4. STK11, TP53, TSC1, TSC2, and VHL.  The following genes were evaluated for sequence changes only: SDHA and HOXB13 c.251G>A variant only.  Based on Ms. Diebold's personal history of cancer, she meets medical criteria for genetic testing under NCCN's consider testing category (personal hx of 2 breast cancers under age 69). Despite that she meets criteria, she may still have an out of pocket cost.   PLAN: After considering the risks, benefits, and limitations, Ms. Albergo provided informed consent to pursue genetic testing. A saliva kit will be mailed to the patient and she will send her sample to [facility] for analysis of the Common Hereditary Cancers Panel. Results should be available within approximately 2-3 weeks' time, at which point they will be disclosed by telephone to Ms. Baine, as will any additional recommendations warranted by these results. Ms. Alberta will receive a summary of her genetic counseling visit and a copy of her results once available. This information will also be available in Epic.   Lastly, we encouraged Ms. Sakuma to remain in contact with cancer genetics annually so that we can continuously update the family history and inform her of any changes in cancer genetics and testing that may be of benefit for this family.   Ms. Tetro's questions were answered to her satisfaction today. Our contact information was provided should additional questions or concerns arise. Thank you for the referral and allowing [place] to share in the care of your patient.   Faith Rogue, MS, [facility] Genetic Counselor [email or web address] Phone: [phone]  The patient was seen for a total of 25 minutes in virtual genetic counseling.  Drs. Magrinat, Lindi Adie and/or Burr Medico were available for discussion regarding  this case.   _______________________________________________________________________ For Office Staff:  Number of people involved in  session: 1 Was an Intern/ student involved with case: no

## 2019-06-17 ENCOUNTER — Encounter: Payer: Self-pay | Admitting: General Surgery

## 2019-06-17 ENCOUNTER — Ambulatory Visit (INDEPENDENT_AMBULATORY_CARE_PROVIDER_SITE_OTHER): Payer: Medicare Other | Admitting: General Surgery

## 2019-06-17 ENCOUNTER — Other Ambulatory Visit (HOSPITAL_COMMUNITY): Payer: Self-pay

## 2019-06-17 ENCOUNTER — Other Ambulatory Visit: Payer: Self-pay

## 2019-06-17 VITALS — BP 134/77 | HR 71 | Temp 98.3°F | Resp 16 | Ht 65.0 in | Wt 265.0 lb

## 2019-06-17 DIAGNOSIS — C541 Malignant neoplasm of endometrium: Secondary | ICD-10-CM

## 2019-06-17 DIAGNOSIS — C50112 Malignant neoplasm of central portion of left female breast: Secondary | ICD-10-CM

## 2019-06-17 DIAGNOSIS — Z170 Estrogen receptor positive status [ER+]: Secondary | ICD-10-CM

## 2019-06-17 NOTE — H&P (Signed)
Stephanie Jarvis; [id number]; 03/01/1951   HPI Patient is a 68-year-old white female who was referred to my care by oncology for Port-A-Cath insertion.  She has endometrial carcinoma is about to undergo chemotherapy.  She denies any pain.  She has 0 out of 10 pain.  She had a Port-A-Cath in the remote past.  She is currently on Eliquis. Past Medical History:  Diagnosis Date  . Ankle pain   . Anxiety   . Arthritis   . Asthma    Seasonal asthma  . Breast cancer ([facility]) 2008   ILC of Left breast; ER+  . 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 Complication of anesthesia    Bad gag reflex  . Depression   . Dysrhythmia    A-fib  . Endometrial cancer ([facility])   . GERD (gastroesophageal reflux disease)   . H/O hiatal hernia 2003  . History of atrial fibrillation 2011  . History of blood transfusion   . History of hepatitis A    Age 16  . History of pneumonia   . Hyperactive gag reflex   . Hyperlipidemia   . Hypertension   . Knee pain   . Obesity, Class III, BMI 40-49.9 (morbid obesity) ([facility])   . Pneumonia   . Sciatica   . Sleep apnea    cpap  . Vertigo     Past Surgical History:  Procedure Laterality Date  . BREAST LUMPECTOMY Left 1982,2008   x 2   . CHOLECYSTECTOMY N/A 12/31/2016   Procedure: LAPAROSCOPIC CHOLECYSTECTOMY WITH POSSIBLE INTRAOPERATIVE CHOLANGIOGRAM;  Surgeon: Wakefield, Matthew, MD;  [facility];  Service: General;  Laterality: N/A;  . COLONOSCOPY    . EYE SURGERY     bil cataract surgery  . HIATAL HERNIA REPAIR  2003  . Lymph node removal  2008  . MASTECTOMY MODIFIED RADICAL Left 01/26/2014   Procedure: LEFT MODIFIED RADICAL MASTECTOMY;  Surgeon: Cornie Mccomber A Kaylem Gidney, MD;  Location: [facility];  Service: General;  Laterality: Left;  . PORT-A-CATH REMOVAL  2009 ish  . PORTA CATH INSERTION  2008ish  . ROBOTIC ASSISTED TOTAL HYSTERECTOMY WITH BILATERAL SALPINGO OOPHERECTOMY N/A 05/18/2019   Procedure: XI ROBOTIC ASSISTED TOTAL  HYSTERECTOMY WITH BILATERAL SALPINGO OOPHORECTOMY;  Surgeon: Tucker, Katherine R, MD;  [facility];  Service: Gynecology;  Laterality: N/A;  . SENTINEL NODE BIOPSY N/A 05/18/2019   Procedure: SENTINEL LYMPH NODE BIOPSY;  Surgeon: Tucker, Katherine R, MD;  [facility];  Service: Gynecology;  Laterality: N/A;  . TOTAL KNEE ARTHROPLASTY Right 2006  . TOTAL KNEE ARTHROPLASTY  08/12/2011   Procedure: TOTAL KNEE ARTHROPLASTY;  Surgeon: Stanley Harrison, MD;  Location: [facility];  Service: Orthopedics;  Laterality: Left;  . TUBAL LIGATION  1980    Family History  Problem Relation Age of Onset  . Stroke Father   . Heart disease Father   . Skin cancer Sister        dx. 57-58; unknown type  . Throat cancer Maternal Uncle 75       smoker; voicebox removed  . Alzheimer's disease Paternal Grandmother   . Heart attack Paternal Grandfather   . Alzheimer's disease Maternal Aunt   . Heart disease Maternal Uncle   . Lung cancer Cousin        smoker  . Lung cancer Paternal Aunt        dx. mid-70s; worked at tobacco company  . Heart attack Daughter          caused by thyroid issues  . Heart disease Other   . Arthritis Other   . Lung disease Other   . Cancer Other   . Kidney disease Other   . Colon cancer Other   . Ovarian cancer Other   . Endometrial cancer Other     Current Outpatient Medications on File Prior to Visit  Medication Sig Dispense Refill  . albuterol (PROVENTIL) (2.5 MG/3ML) 0.083% nebulizer solution Take 3 mLs by nebulization 4 (four) times daily as needed for wheezing or shortness of breath.     . albuterol (VENTOLIN HFA) 108 (90 Base) MCG/ACT inhaler Inhale 1-2 puffs into the lungs every 6 (six) hours as needed for wheezing or shortness of breath.    . apixaban (ELIQUIS) 5 MG TABS tablet Take 1 tablet (5 mg total) by mouth 2 (two) times daily. 180 tablet 3  . baclofen (LIORESAL) 20 MG tablet Take 20 mg by mouth 2 (two) times daily as needed for muscle spasms.     .  buPROPion (WELLBUTRIN XL) 300 MG 24 hr tablet Take 300 mg by mouth daily.    . Calcium Carbonate-Vit D-Min (CALCIUM 1200 PO) Take 1 tablet by mouth daily.     . fexofenadine (ALLEGRA) 180 MG tablet Take 180 mg by mouth daily.    . furosemide (LASIX) 40 MG tablet Take 1 tablet (40 mg total) by mouth 2 (two) times daily. 180 tablet 3  . ibuprofen (ADVIL) 600 MG tablet Take 1 tablet (600 mg total) by mouth every 6 (six) hours as needed for moderate pain. For AFTER surgery 30 tablet 1  . metoprolol tartrate (LOPRESSOR) 25 MG tablet Take 1 tablet (25 mg total) by mouth 2 (two) times daily. 180 tablet 3  . omeprazole (PRILOSEC) 20 MG capsule Take 20 mg by mouth 2 (two) times daily.     . potassium chloride SA (KLOR-CON M20) 20 MEQ tablet Take 2 tablets (40 mEq total) by mouth every morning. & 20 meq in the evening (Patient taking differently: Take 20 mEq by mouth 2 (two) times daily. ) 180 tablet 3  . rosuvastatin (CRESTOR) 20 MG tablet Take 20 mg by mouth at bedtime.     . sertraline (ZOLOFT) 100 MG tablet Take 100 mg by mouth at bedtime.     No current facility-administered medications on file prior to visit.    No Known Allergies  Social History   Substance and Sexual Activity  Alcohol Use Yes  . Alcohol/week: 0.0 standard drinks   Comment: 2 glasses wine per month    Social History   Tobacco Use  Smoking Status Former Smoker  . Packs/day: 3.00  . Years: 25.00  . Pack years: 75.00  . Types: Cigarettes  . Quit date: 06/30/1997  . Years since quitting: 21.9  Smokeless Tobacco Never Used    Review of Systems  Constitutional: Negative.   HENT: Negative.   Eyes: Negative.   Respiratory: Positive for cough and wheezing.   Cardiovascular: Negative.   Gastrointestinal: Negative.   Genitourinary: Negative.   Musculoskeletal: Negative.   Skin: Negative.   Neurological: Negative.   Endo/Heme/Allergies: Bruises/bleeds easily.  Psychiatric/Behavioral: Negative.     Objective    Vitals:   06/17/19 0851  BP: 134/77  Pulse: 71  Resp: 16  Temp: 98.3 F (36.8 C)  SpO2: 94%    Physical Exam Vitals reviewed.  Constitutional:      Appearance: Normal appearance. She is obese.  HENT:     Head: Normocephalic and   atraumatic.  Cardiovascular:     Rate and Rhythm: Normal rate and regular rhythm.     Heart sounds: Normal heart sounds. No murmur. No friction rub. No gallop.   Pulmonary:     Effort: Pulmonary effort is normal. No respiratory distress.     Breath sounds: Normal breath sounds. No stridor. No wheezing, rhonchi or rales.  Skin:    General: Skin is warm and dry.  Neurological:     Mental Status: She is alert and oriented to person, place, and time.    Oncology notes reviewed Assessment  Endometrial carcinoma, need for central venous access Plan   Patient is scheduled for Port-A-Cath insertion on 06/23/2019.  The risks and benefits of the procedure including bleeding, infection, and pneumothorax were fully explained to the patient, who gave informed consent.  She will stop her Eliquis 2 days prior to the procedure. 

## 2019-06-17 NOTE — Progress Notes (Signed)
Stephanie Jarvis; [id number]; August 10, 1950   HPI Patient is a 68 year old white female who was referred to my care by oncology for Port-A-Cath insertion.  She has endometrial carcinoma is about to undergo chemotherapy.  She denies any pain.  She has 0 out of 10 pain.  She had a Port-A-Cath in the remote past.  She is currently on Eliquis. Past Medical History:  Diagnosis Date  . Ankle pain   . Anxiety   . Arthritis   . Asthma    Seasonal asthma  . Breast cancer ([place]) 2008   ILC of Left breast; ER+  . 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 Complication of anesthesia    Bad gag reflex  . Depression   . Dysrhythmia    A-fib  . Endometrial cancer ([place])   . GERD (gastroesophageal reflux disease)   . H/O hiatal hernia 2003  . History of atrial fibrillation 2011  . History of blood transfusion   . History of hepatitis A    Age 1  . History of pneumonia   . Hyperactive gag reflex   . Hyperlipidemia   . Hypertension   . Knee pain   . Obesity, Class III, BMI 40-49.9 (morbid obesity) ([place])   . Pneumonia   . Sciatica   . Sleep apnea    cpap  . Vertigo     Past Surgical History:  Procedure Laterality Date  . BREAST LUMPECTOMY Left 1982,2008   x 2   . CHOLECYSTECTOMY N/A 12/31/2016   Procedure: LAPAROSCOPIC CHOLECYSTECTOMY WITH POSSIBLE INTRAOPERATIVE CHOLANGIOGRAM;  Surgeon: Rolm Bookbinder, MD;  Location: [facility];  Service: General;  Laterality: N/A;  . COLONOSCOPY    . EYE SURGERY     bil cataract surgery  . HIATAL HERNIA REPAIR  2003  . Lymph node removal  2008  . MASTECTOMY MODIFIED RADICAL Left 01/26/2014   Procedure: LEFT MODIFIED RADICAL MASTECTOMY;  Surgeon: Jamesetta So, MD;  Location: [facility];  Service: General;  Laterality: Left;  . PORT-A-CATH REMOVAL  2009 ish  . PORTA CATH INSERTION  [id number]  . ROBOTIC ASSISTED TOTAL HYSTERECTOMY WITH BILATERAL SALPINGO OOPHERECTOMY N/A 05/18/2019   Procedure: XI ROBOTIC ASSISTED TOTAL  HYSTERECTOMY WITH BILATERAL SALPINGO OOPHORECTOMY;  Surgeon: Lafonda Mosses, MD;  Location: [facility];  Service: Gynecology;  Laterality: N/A;  . SENTINEL NODE BIOPSY N/A 05/18/2019   Procedure: SENTINEL LYMPH NODE BIOPSY;  Surgeon: Lafonda Mosses, MD;  Location: [facility];  Service: Gynecology;  Laterality: N/A;  . TOTAL KNEE ARTHROPLASTY Right 2006  . TOTAL KNEE ARTHROPLASTY  08/12/2011   Procedure: TOTAL KNEE ARTHROPLASTY;  Surgeon: Arther Abbott, MD;  Location: [facility];  Service: Orthopedics;  Laterality: Left;  . TUBAL LIGATION  1980    Family History  Problem Relation Age of Onset  . Stroke Father   . Heart disease Father   . Skin cancer Sister        dx. 25-58; unknown type  . Throat cancer Maternal Uncle 75       smoker; voicebox removed  . Alzheimer's disease Paternal Grandmother   . Heart attack Paternal Grandfather   . Alzheimer's disease Maternal Aunt   . Heart disease Maternal Uncle   . Lung cancer Cousin        smoker  . Lung cancer Paternal Aunt        dx. mid-70s; worked at [employer]  . Heart attack Daughter  caused by thyroid issues  . Heart disease Other   . Arthritis Other   . Lung disease Other   . Cancer Other   . Kidney disease Other   . Colon cancer Other   . Ovarian cancer Other   . Endometrial cancer Other     Current Outpatient Medications on File Prior to Visit  Medication Sig Dispense Refill  . albuterol (PROVENTIL) (2.5 MG/3ML) 0.083% nebulizer solution Take 3 mLs by nebulization 4 (four) times daily as needed for wheezing or shortness of breath.     Marland Kitchen albuterol (VENTOLIN HFA) 108 (90 Base) MCG/ACT inhaler Inhale 1-2 puffs into the lungs every 6 (six) hours as needed for wheezing or shortness of breath.    Marland Kitchen apixaban (ELIQUIS) 5 MG TABS tablet Take 1 tablet (5 mg total) by mouth 2 (two) times daily. 180 tablet 3  . baclofen (LIORESAL) 20 MG tablet Take 20 mg by mouth 2 (two) times daily as needed for muscle spasms.     Marland Kitchen  buPROPion (WELLBUTRIN XL) 300 MG 24 hr tablet Take 300 mg by mouth daily.    . Calcium Carbonate-Vit D-Min (CALCIUM 1200 PO) Take 1 tablet by mouth daily.     . fexofenadine (ALLEGRA) 180 MG tablet Take 180 mg by mouth daily.    . furosemide (LASIX) 40 MG tablet Take 1 tablet (40 mg total) by mouth 2 (two) times daily. 180 tablet 3  . ibuprofen (ADVIL) 600 MG tablet Take 1 tablet (600 mg total) by mouth every 6 (six) hours as needed for moderate pain. For AFTER surgery 30 tablet 1  . metoprolol tartrate (LOPRESSOR) 25 MG tablet Take 1 tablet (25 mg total) by mouth 2 (two) times daily. 180 tablet 3  . omeprazole (PRILOSEC) 20 MG capsule Take 20 mg by mouth 2 (two) times daily.     . potassium chloride SA (KLOR-CON M20) 20 MEQ tablet Take 2 tablets (40 mEq total) by mouth every morning. & 20 meq in the evening (Patient taking differently: Take 20 mEq by mouth 2 (two) times daily. ) 180 tablet 3  . rosuvastatin (CRESTOR) 20 MG tablet Take 20 mg by mouth at bedtime.     . sertraline (ZOLOFT) 100 MG tablet Take 100 mg by mouth at bedtime.     No current facility-administered medications on file prior to visit.    No Known Allergies  Social History   Substance and Sexual Activity  Alcohol Use Yes  . Alcohol/week: 0.0 standard drinks   Comment: 2 glasses wine per month    Social History   Tobacco Use  Smoking Status Former Smoker  . Packs/day: 3.00  . Years: 25.00  . Pack years: 75.00  . Types: Cigarettes  . Quit date: 06/30/1997  . Years since quitting: 21.9  Smokeless Tobacco Never Used    Review of Systems  Constitutional: Negative.   HENT: Negative.   Eyes: Negative.   Respiratory: Positive for cough and wheezing.   Cardiovascular: Negative.   Gastrointestinal: Negative.   Genitourinary: Negative.   Musculoskeletal: Negative.   Skin: Negative.   Neurological: Negative.   Endo/Heme/Allergies: Bruises/bleeds easily.  Psychiatric/Behavioral: Negative.     Objective    Vitals:   06/17/19 0851  BP: 134/77  Pulse: 71  Resp: 16  Temp: 98.3 F (36.8 C)  SpO2: 94%    Physical Exam Vitals reviewed.  Constitutional:      Appearance: Normal appearance. She is obese.  HENT:     Head: Normocephalic and  atraumatic.  Cardiovascular:     Rate and Rhythm: Normal rate and regular rhythm.     Heart sounds: Normal heart sounds. No murmur. No friction rub. No gallop.   Pulmonary:     Effort: Pulmonary effort is normal. No respiratory distress.     Breath sounds: Normal breath sounds. No stridor. No wheezing, rhonchi or rales.  Skin:    General: Skin is warm and dry.  Neurological:     Mental Status: She is alert and oriented to person, place, and time.    Oncology notes reviewed Assessment  Endometrial carcinoma, need for central venous access Plan   Patient is scheduled for Port-A-Cath insertion on 06/23/2019.  The risks and benefits of the procedure including bleeding, infection, and pneumothorax were fully explained to the patient, who gave informed consent.  She will stop her Eliquis 2 days prior to the procedure.

## 2019-06-17 NOTE — Patient Instructions (Signed)

## 2019-06-18 NOTE — Patient Instructions (Signed)
Stephanie Jarvis  06/18/2019     @PREFPERIOPPHARMACY @   Your procedure is scheduled on  06/23/2019 .  Report to [facility] at  [phone]  A.M.  Call this number if you have problems the morning of surgery:  [phone]   Remember:  Do not eat or drink after midnight.                        Take these medicines the morning of surgery with A SIP OF WATER  Wellbutrin, allegra, metoprolol, prilosec. Use your nebulizer and your inhaler before you come.    Do not wear jewelry, make-up or nail polish.  Do not wear lotions, powders, or perfumes.Please brush your teeth.  Do not shave 48 hours prior to surgery.  Men may shave face and neck.  Do not bring valuables to the hospital.  [facility] is not responsible for any belongings or valuables.  Contacts, dentures or bridgework may not be worn into surgery.  Leave your suitcase in the car.  After surgery it may be brought to your room.  For patients admitted to the hospital, discharge time will be determined by your treatment team.  Patients discharged the day of surgery will not be allowed to drive home.   Name and phone number of your driver:   family Special instructions:  None  Please read over the following fact sheets that you were given. Anesthesia Post-op Instructions and Care and Recovery After Surgery       Implanted Port Insertion, Care After This sheet gives you information about how to care for yourself after your procedure. Your health care provider may also give you more specific instructions. If you have problems or questions, contact your health care provider. What can I expect after the procedure? After the procedure, it is common to have:  Discomfort at the port insertion site.  Bruising on the skin over the port. This should improve over 3-4 days. Follow these instructions at home: [facility] care  After your port is placed, you will get a manufacturer's information card. The card has information  about your port. Keep this card with you at all times.  Take care of the port as told by your health care provider. Ask your health care provider if you or a family member can get training for taking care of the port at home. A home health care nurse may also take care of the port.  Make sure to remember what type of port you have. Incision care      Follow instructions from your health care provider about how to take care of your port insertion site. Make sure you: ? Wash your hands with soap and water before and after you change your bandage (dressing). If soap and water are not available, use hand sanitizer. ? Change your dressing as told by your health care provider. ? Leave stitches (sutures), skin glue, or adhesive strips in place. These skin closures may need to stay in place for 2 weeks or longer. If adhesive strip edges start to loosen and curl up, you may trim the loose edges. Do not remove adhesive strips completely unless your health care provider tells you to do that.  Check your port insertion site every day for signs of infection. Check for: ? Redness, swelling, or pain. ? Fluid or blood. ? Warmth. ? Pus or a bad smell. Activity  Return to your  normal activities as told by your health care provider. Ask your health care provider what activities are safe for you.  Do not lift anything that is heavier than 10 lb (4.5 kg), or the limit that you are told, until your health care provider says that it is safe. General instructions  Take over-the-counter and prescription medicines only as told by your health care provider.  Do not take baths, swim, or use a hot tub until your health care provider approves. Ask your health care provider if you may take showers. You may only be allowed to take sponge baths.  Do not drive for 24 hours if you were given a sedative during your procedure.  Wear a medical alert bracelet in case of an emergency. This will tell any health care  providers that you have a port.  Keep all follow-up visits as told by your health care provider. This is important. Contact a health care provider if:  You cannot flush your port with saline as directed, or you cannot draw blood from the port.  You have a fever or chills.  You have redness, swelling, or pain around your port insertion site.  You have fluid or blood coming from your port insertion site.  Your port insertion site feels warm to the touch.  You have pus or a bad smell coming from the port insertion site. Get help right away if:  You have chest pain or shortness of breath.  You have bleeding from your port that you cannot control. Summary  Take care of the port as told by your health care provider. Keep the manufacturer's information card with you at all times.  Change your dressing as told by your health care provider.  Contact a health care provider if you have a fever or chills or if you have redness, swelling, or pain around your port insertion site.  Keep all follow-up visits as told by your health care provider. This information is not intended to replace advice given to you by your health care provider. Make sure you discuss any questions you have with your health care provider. Document Released: 04/07/2013 Document Revised: 01/13/2018 Document Reviewed: 01/13/2018 Elsevier Patient Education  [place] An implanted port is a device that is placed under the skin. It is usually placed in the chest. The device can be used to give IV medicine, to take blood, or for dialysis. You may have an implanted port if:  You need IV medicine that would be irritating to the small veins in your hands or arms.  You need IV medicines, such as antibiotics, for a long period of time.  You need IV nutrition for a long period of time.  You need dialysis. Having a port means that your health care provider will not need to use the veins in  your arms for these procedures. You may have fewer limitations when using a port than you would if you used other types of long-term IVs, and you will likely be able to return to normal activities after your incision heals. An implanted port has two main parts:  Reservoir. The reservoir is the part where a needle is inserted to give medicines or draw blood. The reservoir is round. After it is placed, it appears as a small, raised area under your skin.  Catheter. The catheter is a thin, flexible tube that connects the reservoir to a vein. Medicine that is inserted into the reservoir goes into the catheter  and then into the vein. How is my port accessed? To access your port:  A numbing cream may be placed on the skin over the port site.  Your health care provider will put on a mask and sterile gloves.  The skin over your port will be cleaned carefully with a germ-killing soap and allowed to dry.  Your health care provider will gently pinch the port and insert a needle into it.  Your health care provider will check for a blood return to make sure the port is in the vein and is not clogged.  If your port needs to remain accessed to get medicine continuously (constant infusion), your health care provider will place a clear bandage (dressing) over the needle site. The dressing and needle will need to be changed every week, or as told by your health care provider. What is flushing? Flushing helps keep the port from getting clogged. Follow instructions from your health care provider about how and when to flush the port. Ports are usually flushed with saline solution or a medicine called heparin. The need for flushing will depend on how the port is used:  If the port is only used from time to time to give medicines or draw blood, the port may need to be flushed: ? Before and after medicines have been given. ? Before and after blood has been drawn. ? As part of routine maintenance. Flushing may be  recommended every 4-6 weeks.  If a constant infusion is running, the port may not need to be flushed.  Throw away any syringes in a disposal container that is meant for sharp items (sharps container). You can buy a sharps container from a pharmacy, or you can make one by using an empty hard plastic bottle with a cover. How long will my port stay implanted? The port can stay in for as long as your health care provider thinks it is needed. When it is time for the port to come out, a surgery will be done to remove it. The surgery will be similar to the procedure that was done to put the port in. Follow these instructions at home:   Flush your port as told by your health care provider.  If you need an infusion over several days, follow instructions from your health care provider about how to take care of your port site. Make sure you: ? Wash your hands with soap and water before you change your dressing. If soap and water are not available, use alcohol-based hand sanitizer. ? Change your dressing as told by your health care provider. ? Place any used dressings or infusion bags into a plastic bag. Throw that bag in the trash. ? Keep the dressing that covers the needle clean and dry. Do not get it wet. ? Do not use scissors or sharp objects near the tube. ? Keep the tube clamped, unless it is being used.  Check your port site every day for signs of infection. Check for: ? Redness, swelling, or pain. ? Fluid or blood. ? Pus or a bad smell.  Protect the skin around the port site. ? Avoid wearing bra straps that rub or irritate the site. ? Protect the skin around your port from seat belts. Place a soft pad over your chest if needed.  Bathe or shower as told by your health care provider. The site may get wet as long as you are not actively receiving an infusion.  Return to your normal activities as told by  your health care provider. Ask your health care provider what activities are safe for  you.  Carry a medical alert card or wear a medical alert bracelet at all times. This will let health care providers know that you have an implanted port in case of an emergency. Get help right away if:  You have redness, swelling, or pain at the port site.  You have fluid or blood coming from your port site.  You have pus or a bad smell coming from the port site.  You have a fever. Summary  Implanted ports are usually placed in the chest for long-term IV access.  Follow instructions from your health care provider about flushing the port and changing bandages (dressings).  Take care of the area around your port by avoiding clothing that puts pressure on the area, and by watching for signs of infection.  Protect the skin around your port from seat belts. Place a soft pad over your chest if needed.  Get help right away if you have a fever or you have redness, swelling, pain, drainage, or a bad smell at the port site. This information is not intended to replace advice given to you by your health care provider. Make sure you discuss any questions you have with your health care provider. Document Released: 06/17/2005 Document Revised: 10/09/2018 Document Reviewed: 07/20/2016 Elsevier Patient Education  2020 [place] After These instructions provide you with information about caring for yourself after your procedure. Your health care provider may also give you more specific instructions. Your treatment has been planned according to current medical practices, but problems sometimes occur. Call your health care provider if you have any problems or questions after your procedure. What can I expect after the procedure? After your procedure, you may:  Feel sleepy for several hours.  Feel clumsy and have poor balance for several hours.  Feel forgetful about what happened after the procedure.  Have poor judgment for several hours.  Feel nauseous or  vomit.  Have a sore throat if you had a breathing tube during the procedure. Follow these instructions at home: For at least 24 hours after the procedure:      Have a responsible adult stay with you. It is important to have someone help care for you until you are awake and alert.  Rest as needed.  Do not: ? Participate in activities in which you could fall or become injured. ? Drive. ? Use heavy machinery. ? Drink alcohol. ? Take sleeping pills or medicines that cause drowsiness. ? Make important decisions or sign legal documents. ? Take care of children on your own. Eating and drinking  Follow the diet that is recommended by your health care provider.  If you vomit, drink water, juice, or soup when you can drink without vomiting.  Make sure you have little or no nausea before eating solid foods. General instructions  Take over-the-counter and prescription medicines only as told by your health care provider.  If you have sleep apnea, surgery and certain medicines can increase your risk for breathing problems. Follow instructions from your health care provider about wearing your sleep device: ? Anytime you are sleeping, including during daytime naps. ? While taking prescription pain medicines, sleeping medicines, or medicines that make you drowsy.  If you smoke, do not smoke without supervision.  Keep all follow-up visits as told by your health care provider. This is important. Contact a health care provider if:  You keep feeling  nauseous or you keep vomiting.  You feel light-headed.  You develop a rash.  You have a fever. Get help right away if:  You have trouble breathing. Summary  For several hours after your procedure, you may feel sleepy and have poor judgment.  Have a responsible adult stay with you for at least 24 hours or until you are awake and alert. This information is not intended to replace advice given to you by your health care provider. Make sure  you discuss any questions you have with your health care provider. Document Released: 10/08/2015 Document Revised: 09/15/2017 Document Reviewed: 10/08/2015 Elsevier Patient Education  2020 [employer]. How to Use Chlorhexidine for Bathing Chlorhexidine gluconate (CHG) is a germ-killing (antiseptic) solution that is used to clean the skin. It can get rid of the bacteria that normally live on the skin and can keep them away for about 24 hours. To clean your skin with CHG, you may be given:  A CHG solution to use in the shower or as part of a sponge bath.  A prepackaged cloth that contains CHG. Cleaning your skin with CHG may help lower the risk for infection:  While you are staying in the intensive care unit of the hospital.  If you have a vascular access, such as a central line, to provide short-term or long-term access to your veins.  If you have a catheter to drain urine from your bladder.  If you are on a ventilator. A ventilator is a machine that helps you breathe by moving air in and out of your lungs.  After surgery. What are the risks? Risks of using CHG include:  A skin reaction.  Hearing loss, if CHG gets in your ears.  Eye injury, if CHG gets in your eyes and is not rinsed out.  The CHG product catching fire. Make sure that you avoid smoking and flames after applying CHG to your skin. Do not use CHG:  If you have a chlorhexidine allergy or have previously reacted to chlorhexidine.  On babies younger than 36 months of age. How to use CHG solution  Use CHG only as told by your health care provider, and follow the instructions on the label.  Use the full amount of CHG as directed. Usually, this is one bottle. During a shower Follow these steps when using CHG solution during a shower (unless your health care provider gives you different instructions): 1. Start the shower. 2. Use your normal soap and shampoo to wash your face and hair. 3. Turn off the shower or move  out of the shower stream. 4. Pour the CHG onto a clean washcloth. Do not use any type of brush or rough-edged sponge. 5. Starting at your neck, lather your body down to your toes. Make sure you follow these instructions: ? If you will be having surgery, pay special attention to the part of your body where you will be having surgery. Scrub this area for at least 1 minute. ? Do not use CHG on your head or face. If the solution gets into your ears or eyes, rinse them well with water. ? Avoid your genital area. ? Avoid any areas of skin that have broken skin, cuts, or scrapes. ? Scrub your back and under your arms. Make sure to wash skin folds. 6. Let the lather sit on your skin for 1-2 minutes or as long as told by your health care provider. 7. Thoroughly rinse your entire body in the shower. Make sure that all  body creases and crevices are rinsed well. 8. Dry off with a clean towel. Do not put any substances on your body afterward--such as powder, lotion, or perfume--unless you are told to do so by your health care provider. Only use lotions that are recommended by the manufacturer. 9. Put on clean clothes or pajamas. 10. If it is the night before your surgery, sleep in clean sheets.  During a sponge bath Follow these steps when using CHG solution during a sponge bath (unless your health care provider gives you different instructions): 1. Use your normal soap and shampoo to wash your face and hair. 2. Pour the CHG onto a clean washcloth. 3. Starting at your neck, lather your body down to your toes. Make sure you follow these instructions: ? If you will be having surgery, pay special attention to the part of your body where you will be having surgery. Scrub this area for at least 1 minute. ? Do not use CHG on your head or face. If the solution gets into your ears or eyes, rinse them well with water. ? Avoid your genital area. ? Avoid any areas of skin that have broken skin, cuts, or  scrapes. ? Scrub your back and under your arms. Make sure to wash skin folds. 4. Let the lather sit on your skin for 1-2 minutes or as long as told by your health care provider. 5. Using a different clean, wet washcloth, thoroughly rinse your entire body. Make sure that all body creases and crevices are rinsed well. 6. Dry off with a clean towel. Do not put any substances on your body afterward--such as powder, lotion, or perfume--unless you are told to do so by your health care provider. Only use lotions that are recommended by the manufacturer. 7. Put on clean clothes or pajamas. 8. If it is the night before your surgery, sleep in clean sheets. How to use CHG prepackaged cloths  Only use CHG cloths as told by your health care provider, and follow the instructions on the label.  Use the CHG cloth on clean, dry skin.  Do not use the CHG cloth on your head or face unless your health care provider tells you to.  When washing with the CHG cloth: ? Avoid your genital area. ? Avoid any areas of skin that have broken skin, cuts, or scrapes. Before surgery Follow these steps when using a CHG cloth to clean before surgery (unless your health care provider gives you different instructions): 1. Using the CHG cloth, vigorously scrub the part of your body where you will be having surgery. Scrub using a back-and-forth motion for 3 minutes. The area on your body should be completely wet with CHG when you are done scrubbing. 2. Do not rinse. Discard the cloth and let the area air-dry. Do not put any substances on the area afterward, such as powder, lotion, or perfume. 3. Put on clean clothes or pajamas. 4. If it is the night before your surgery, sleep in clean sheets.  For general bathing Follow these steps when using CHG cloths for general bathing (unless your health care provider gives you different instructions). 1. Use a separate CHG cloth for each area of your body. Make sure you wash between any  folds of skin and between your fingers and toes. Wash your body in the following order, switching to a new cloth after each step: ? The front of your neck, shoulders, and chest. ? Both of your arms, under your arms, and your  hands. ? Your stomach and groin area, avoiding the genitals. ? Your right leg and foot. ? Your left leg and foot. ? The back of your neck, your back, and your buttocks. 2. Do not rinse. Discard the cloth and let the area air-dry. Do not put any substances on your body afterward--such as powder, lotion, or perfume--unless you are told to do so by your health care provider. Only use lotions that are recommended by the manufacturer. 3. Put on clean clothes or pajamas. Contact a health care provider if:  Your skin gets irritated after scrubbing.  You have questions about using your solution or cloth. Get help right away if:  Your eyes become very red or swollen.  Your eyes itch badly.  Your skin itches badly and is red or swollen.  Your hearing changes.  You have trouble seeing.  You have swelling or tingling in your mouth or throat.  You have trouble breathing.  You swallow any chlorhexidine. Summary  Chlorhexidine gluconate (CHG) is a germ-killing (antiseptic) solution that is used to clean the skin. Cleaning your skin with CHG may help to lower your risk for infection.  You may be given CHG to use for bathing. It may be in a bottle or in a prepackaged cloth to use on your skin. Carefully follow your health care provider's instructions and the instructions on the product label.  Do not use CHG if you have a chlorhexidine allergy.  Contact your health care provider if your skin gets irritated after scrubbing. This information is not intended to replace advice given to you by your health care provider. Make sure you discuss any questions you have with your health care provider. Document Released: 03/11/2012 Document Revised: 09/03/2018 Document Reviewed:  05/15/2017 Elsevier Patient Education  2020 [employer].

## 2019-06-21 ENCOUNTER — Encounter (HOSPITAL_COMMUNITY)
Admission: RE | Admit: 2019-06-21 | Discharge: 2019-06-21 | Disposition: A | Discharge status: Home / Self Care | Payer: Medicare Other | Source: Ambulatory Visit | Attending: General Surgery | Admitting: General Surgery

## 2019-06-21 ENCOUNTER — Other Ambulatory Visit: Payer: Self-pay

## 2019-06-21 ENCOUNTER — Encounter (HOSPITAL_COMMUNITY): Payer: Self-pay

## 2019-06-21 ENCOUNTER — Other Ambulatory Visit (HOSPITAL_COMMUNITY)
Admission: RE | Admit: 2019-06-21 | Discharge: 2019-06-21 | Disposition: A | Discharge status: Home / Self Care | Payer: Medicare Other | Source: Ambulatory Visit | Attending: General Surgery | Admitting: General Surgery

## 2019-06-21 DIAGNOSIS — Z20828 Contact with and (suspected) exposure to other viral communicable diseases: Secondary | ICD-10-CM | POA: Insufficient documentation

## 2019-06-21 DIAGNOSIS — Z01812 Encounter for preprocedural laboratory examination: Secondary | ICD-10-CM | POA: Insufficient documentation

## 2019-06-21 LAB — COMPREHENSIVE METABOLIC PANEL WITH GFR
ALT: 18 U/L (ref 0–44)
AST: 21 U/L (ref 15–41)
Albumin: 3.6 g/dL (ref 3.5–5.0)
Alkaline Phosphatase: 73 U/L (ref 38–126)
Anion gap: 10 (ref 5–15)
BUN: 11 mg/dL (ref 8–23)
CO2: 26 mmol/L (ref 22–32)
Calcium: 9 mg/dL (ref 8.9–10.3)
Chloride: 104 mmol/L (ref 98–111)
Creatinine, Ser: 0.9 mg/dL (ref 0.44–1.00)
GFR calc Af Amer: >60 mL/min (ref >60)
GFR calc non Af Amer: >60 mL/min (ref >60)
Glucose, Bld: 104 mg/dL — ABNORMAL HIGH (ref 70–99)
Potassium: 3.9 mmol/L (ref 3.5–5.1)
Sodium: 140 mmol/L (ref 135–145)
Total Bilirubin: 0.7 mg/dL (ref 0.3–1.2)
Total Protein: 6.9 g/dL (ref 6.5–8.1)

## 2019-06-21 LAB — CBC WITH DIFFERENTIAL/PLATELET
Abs Immature Granulocytes: 0.02 K/uL (ref 0.00–0.07)
Basophils Absolute: 0 K/uL (ref 0.0–0.1)
Basophils Relative: 1 %
Eosinophils Absolute: 0.3 K/uL (ref 0.0–0.5)
Eosinophils Relative: 4 %
HCT: 34.6 % — ABNORMAL LOW (ref 36.0–46.0)
Hemoglobin: 11.4 g/dL — ABNORMAL LOW (ref 12.0–15.0)
Immature Granulocytes: 0 %
Lymphocytes Relative: 30 %
Lymphs Abs: 2.3 K/uL (ref 0.7–4.0)
MCH: 31.5 pg (ref 26.0–34.0)
MCHC: 32.9 g/dL (ref 30.0–36.0)
MCV: 95.6 fL (ref 80.0–100.0)
Monocytes Absolute: 0.8 K/uL (ref 0.1–1.0)
Monocytes Relative: 10 %
Neutro Abs: 4.2 K/uL (ref 1.7–7.7)
Neutrophils Relative %: 55 %
Platelets: 261 K/uL (ref 150–400)
RBC: 3.62 MIL/uL — ABNORMAL LOW (ref 3.87–5.11)
RDW: 13.9 % (ref 11.5–15.5)
WBC: 7.7 K/uL (ref 4.0–10.5)
nRBC: 0 % (ref 0.0–0.2)

## 2019-06-21 LAB — SARS CORONAVIRUS 2 (TAT 6-24 HRS): SARS Coronavirus 2: NEGATIVE

## 2019-06-21 LAB — PROTIME-INR
INR: 1.1 (ref 0.8–1.2)
Prothrombin Time: 13.8 seconds (ref 11.4–15.2)

## 2019-06-22 ENCOUNTER — Inpatient Hospital Stay (HOSPITAL_COMMUNITY): Payer: Medicare Other

## 2019-06-23 ENCOUNTER — Ambulatory Visit (HOSPITAL_COMMUNITY): Payer: Medicare Other | Admitting: Anesthesiology

## 2019-06-23 ENCOUNTER — Ambulatory Visit (HOSPITAL_COMMUNITY): Payer: Medicare Other

## 2019-06-23 ENCOUNTER — Encounter (HOSPITAL_COMMUNITY): Payer: Self-pay | Admitting: General Surgery

## 2019-06-23 ENCOUNTER — Encounter (HOSPITAL_COMMUNITY)
Admission: RE | Disposition: A | Discharge status: Home / Self Care | Payer: Self-pay | Source: Home / Self Care | Attending: General Surgery

## 2019-06-23 ENCOUNTER — Other Ambulatory Visit: Payer: Self-pay

## 2019-06-23 ENCOUNTER — Ambulatory Visit (HOSPITAL_COMMUNITY)
Admission: RE | Admit: 2019-06-23 | Discharge: 2019-06-23 | Disposition: A | Discharge status: Home / Self Care | Payer: Medicare Other | Attending: General Surgery | Admitting: General Surgery

## 2019-06-23 DIAGNOSIS — M1990 Unspecified osteoarthritis, unspecified site: Secondary | ICD-10-CM | POA: Insufficient documentation

## 2019-06-23 DIAGNOSIS — Z7901 Long term (current) use of anticoagulants: Secondary | ICD-10-CM | POA: Insufficient documentation

## 2019-06-23 DIAGNOSIS — I4891 Unspecified atrial fibrillation: Secondary | ICD-10-CM | POA: Insufficient documentation

## 2019-06-23 DIAGNOSIS — K449 Diaphragmatic hernia without obstruction or gangrene: Secondary | ICD-10-CM | POA: Insufficient documentation

## 2019-06-23 DIAGNOSIS — Z791 Long term (current) use of non-steroidal anti-inflammatories (NSAID): Secondary | ICD-10-CM | POA: Diagnosis not present

## 2019-06-23 DIAGNOSIS — Z808 Family history of malignant neoplasm of other organs or systems: Secondary | ICD-10-CM | POA: Insufficient documentation

## 2019-06-23 DIAGNOSIS — Z8261 Family history of arthritis: Secondary | ICD-10-CM | POA: Insufficient documentation

## 2019-06-23 DIAGNOSIS — K219 Gastro-esophageal reflux disease without esophagitis: Secondary | ICD-10-CM | POA: Insufficient documentation

## 2019-06-23 DIAGNOSIS — Z841 Family history of disorders of kidney and ureter: Secondary | ICD-10-CM | POA: Insufficient documentation

## 2019-06-23 DIAGNOSIS — G4730 Sleep apnea, unspecified: Secondary | ICD-10-CM | POA: Diagnosis not present

## 2019-06-23 DIAGNOSIS — Z79899 Other long term (current) drug therapy: Secondary | ICD-10-CM | POA: Diagnosis not present

## 2019-06-23 DIAGNOSIS — Z90710 Acquired absence of both cervix and uterus: Secondary | ICD-10-CM | POA: Insufficient documentation

## 2019-06-23 DIAGNOSIS — Z8249 Family history of ischemic heart disease and other diseases of the circulatory system: Secondary | ICD-10-CM | POA: Insufficient documentation

## 2019-06-23 DIAGNOSIS — F329 Major depressive disorder, single episode, unspecified: Secondary | ICD-10-CM | POA: Insufficient documentation

## 2019-06-23 DIAGNOSIS — Z6841 Body Mass Index (BMI) 40.0 and over, adult: Secondary | ICD-10-CM | POA: Diagnosis not present

## 2019-06-23 DIAGNOSIS — Z87891 Personal history of nicotine dependence: Secondary | ICD-10-CM | POA: Diagnosis not present

## 2019-06-23 DIAGNOSIS — F419 Anxiety disorder, unspecified: Secondary | ICD-10-CM | POA: Insufficient documentation

## 2019-06-23 DIAGNOSIS — Z8041 Family history of malignant neoplasm of ovary: Secondary | ICD-10-CM | POA: Insufficient documentation

## 2019-06-23 DIAGNOSIS — Z823 Family history of stroke: Secondary | ICD-10-CM | POA: Insufficient documentation

## 2019-06-23 DIAGNOSIS — I10 Essential (primary) hypertension: Secondary | ICD-10-CM | POA: Insufficient documentation

## 2019-06-23 DIAGNOSIS — Z800 Family history of malignant neoplasm of digestive organs: Secondary | ICD-10-CM | POA: Insufficient documentation

## 2019-06-23 DIAGNOSIS — M5430 Sciatica, unspecified side: Secondary | ICD-10-CM | POA: Diagnosis not present

## 2019-06-23 DIAGNOSIS — J45909 Unspecified asthma, uncomplicated: Secondary | ICD-10-CM | POA: Insufficient documentation

## 2019-06-23 DIAGNOSIS — Z90722 Acquired absence of ovaries, bilateral: Secondary | ICD-10-CM | POA: Diagnosis not present

## 2019-06-23 DIAGNOSIS — Z95828 Presence of other vascular implants and grafts: Secondary | ICD-10-CM

## 2019-06-23 DIAGNOSIS — Z96653 Presence of artificial knee joint, bilateral: Secondary | ICD-10-CM | POA: Diagnosis not present

## 2019-06-23 DIAGNOSIS — Z801 Family history of malignant neoplasm of trachea, bronchus and lung: Secondary | ICD-10-CM | POA: Insufficient documentation

## 2019-06-23 DIAGNOSIS — C541 Malignant neoplasm of endometrium: Secondary | ICD-10-CM

## 2019-06-23 DIAGNOSIS — E785 Hyperlipidemia, unspecified: Secondary | ICD-10-CM | POA: Diagnosis not present

## 2019-06-23 DIAGNOSIS — Z820 Family history of epilepsy and other diseases of the nervous system: Secondary | ICD-10-CM | POA: Insufficient documentation

## 2019-06-23 DIAGNOSIS — Z9049 Acquired absence of other specified parts of digestive tract: Secondary | ICD-10-CM | POA: Insufficient documentation

## 2019-06-23 DIAGNOSIS — Z8619 Personal history of other infectious and parasitic diseases: Secondary | ICD-10-CM | POA: Insufficient documentation

## 2019-06-23 HISTORY — PX: PORTACATH PLACEMENT: SHX2246

## 2019-06-23 SURGERY — INSERTION, TUNNELED CENTRAL VENOUS DEVICE, WITH PORT
Anesthesia: General | Site: Chest | Laterality: Right

## 2019-06-23 MED ORDER — LIDOCAINE HCL (PF) 1 % IJ SOLN
INTRAMUSCULAR | Status: DC | PRN
  Administered 2019-06-23: 12 mL

## 2019-06-23 MED ORDER — PROPOFOL 10 MG/ML IV BOLUS
INTRAVENOUS | Status: AC
  Filled 2019-06-23: qty 40

## 2019-06-23 MED ORDER — CHLORHEXIDINE GLUCONATE CLOTH 2 % EX PADS: 6.0000 | MEDICATED_PAD | Freq: Once | CUTANEOUS | Status: DC

## 2019-06-23 MED ORDER — HEPARIN SOD (PORK) LOCK FLUSH 100 UNIT/ML IV SOLN
INTRAVENOUS | Status: AC
  Filled 2019-06-23: qty 5

## 2019-06-23 MED ORDER — HYDROMORPHONE HCL 1 MG/ML IJ SOLN: 0.2500 mg | INTRAMUSCULAR | Status: DC | PRN

## 2019-06-23 MED ORDER — HEPARIN SOD (PORK) LOCK FLUSH 100 UNIT/ML IV SOLN
INTRAVENOUS | Status: DC | PRN
  Administered 2019-06-23: 500 [IU] via INTRAVENOUS

## 2019-06-23 MED ORDER — KETAMINE HCL 10 MG/ML IJ SOLN
INTRAMUSCULAR | Status: DC | PRN
  Administered 2019-06-23: 30 mg via INTRAVENOUS

## 2019-06-23 MED ORDER — LIDOCAINE HCL (PF) 1 % IJ SOLN
INTRAMUSCULAR | Status: AC
  Filled 2019-06-23: qty 30

## 2019-06-23 MED ORDER — MIDAZOLAM HCL 2 MG/2ML IJ SOLN: 0.5000 mg | Freq: Once | INTRAMUSCULAR | Status: DC | PRN

## 2019-06-23 MED ORDER — PROMETHAZINE HCL 25 MG/ML IJ SOLN: 6.2500 mg | INTRAMUSCULAR | Status: DC | PRN

## 2019-06-23 MED ORDER — TRAMADOL HCL 50 MG PO TABS: 50.0000 mg | ORAL_TABLET | Freq: Four times a day (QID) | ORAL | 0 refills | Status: DC | PRN

## 2019-06-23 MED ORDER — CEFAZOLIN SODIUM-DEXTROSE 1-4 GM/50ML-% IV SOLN
INTRAVENOUS | Status: AC
  Filled 2019-06-23: qty 50

## 2019-06-23 MED ORDER — HYDROCODONE-ACETAMINOPHEN 7.5-325 MG PO TABS: 1.0000 | ORAL_TABLET | Freq: Once | ORAL | Status: DC | PRN

## 2019-06-23 MED ORDER — PROPOFOL 500 MG/50ML IV EMUL
INTRAVENOUS | Status: DC | PRN
  Administered 2019-06-23: 150 ug/kg/min via INTRAVENOUS

## 2019-06-23 MED ORDER — CEFAZOLIN SODIUM-DEXTROSE 2-4 GM/100ML-% IV SOLN
INTRAVENOUS | Status: AC
  Filled 2019-06-23: qty 100

## 2019-06-23 MED ORDER — KETOROLAC TROMETHAMINE 30 MG/ML IJ SOLN
15.0000 mg | Freq: Once | INTRAMUSCULAR | Status: AC
  Administered 2019-06-23: 15 mg via INTRAVENOUS
  Filled 2019-06-23: qty 1

## 2019-06-23 MED ORDER — KETAMINE HCL 10 MG/ML IJ SOLN
INTRAMUSCULAR | Status: AC
  Filled 2019-06-23: qty 1

## 2019-06-23 MED ORDER — LACTATED RINGERS IV SOLN
INTRAVENOUS | Status: DC
  Administered 2019-06-23: 07:00:00 1000 mL via INTRAVENOUS

## 2019-06-23 MED ORDER — SODIUM CHLORIDE (PF) 0.9 % IJ SOLN
INTRAMUSCULAR | Status: DC | PRN
  Administered 2019-06-23: 10 mL via INTRAVENOUS

## 2019-06-23 MED ORDER — DEXTROSE 5 % IV SOLN
3.0000 g | INTRAVENOUS | Status: AC
  Administered 2019-06-23: 08:00:00 3 g via INTRAVENOUS
  Filled 2019-06-23: qty 3000

## 2019-06-23 SURGICAL SUPPLY — 29 items
APPLICATOR CHLORAPREP 10.5 ORG (MISCELLANEOUS) ×3 IMPLANT
BAG DECANTER FOR FLEXI CONT (MISCELLANEOUS) ×3 IMPLANT
CLOTH BEACON ORANGE TIMEOUT ST (SAFETY) ×3 IMPLANT
COVER LIGHT HANDLE STERIS (MISCELLANEOUS) ×6 IMPLANT
COVER WAND RF STERILE (DRAPES) ×3 IMPLANT
DECANTER SPIKE VIAL GLASS SM (MISCELLANEOUS) ×3 IMPLANT
DERMABOND ADVANCED (GAUZE/BANDAGES/DRESSINGS) ×2
DERMABOND ADVANCED .7 DNX12 (GAUZE/BANDAGES/DRESSINGS) ×1 IMPLANT
DRAPE C-ARM FOLDED MOBILE STRL (DRAPES) ×3 IMPLANT
ELECT REM PT RETURN 9FT ADLT (ELECTROSURGICAL) ×3
ELECTRODE REM PT RTRN 9FT ADLT (ELECTROSURGICAL) ×1 IMPLANT
GLOVE BIOGEL PI IND STRL 7.0 (GLOVE) ×2 IMPLANT
GLOVE BIOGEL PI INDICATOR 7.0 (GLOVE) ×4
GLOVE ECLIPSE 6.5 STRL STRAW (GLOVE) ×3 IMPLANT
GLOVE SURG SS PI 7.5 STRL IVOR (GLOVE) ×3 IMPLANT
GOWN STRL REUS W/TWL LRG LVL3 (GOWN DISPOSABLE) ×6 IMPLANT
IV NS 500ML (IV SOLUTION) ×2
IV NS 500ML BAXH (IV SOLUTION) ×1 IMPLANT
KIT PORT POWER 8FR ISP MRI (Port) ×3 IMPLANT
KIT TURNOVER KIT A (KITS) ×3 IMPLANT
NEEDLE HYPO 25X1 1.5 SAFETY (NEEDLE) ×3 IMPLANT
PACK MINOR (CUSTOM PROCEDURE TRAY) ×3 IMPLANT
PAD ARMBOARD 7.5X6 YLW CONV (MISCELLANEOUS) ×3 IMPLANT
SET BASIN LINEN APH (SET/KITS/TRAYS/PACK) ×3 IMPLANT
SUT MNCRL AB 4-0 PS2 18 (SUTURE) ×3 IMPLANT
SUT VIC AB 3-0 SH 27 (SUTURE) ×2
SUT VIC AB 3-0 SH 27X BRD (SUTURE) ×1 IMPLANT
SYR 5ML LL (SYRINGE) ×3 IMPLANT
SYR CONTROL 10ML LL (SYRINGE) ×3 IMPLANT

## 2019-06-23 NOTE — Anesthesia Preprocedure Evaluation (Signed)
Anesthesia Evaluation  Patient identified by MRN, date of birth, ID band Patient awake  General Assessment Comment:Denies personal or FH of anesth issues when questioned   Reviewed: Allergy & Precautions, NPO status , Patient's Chart, lab work & pertinent test results  Airway Mallampati: III  TM Distance: >3 FB Neck ROM: Full    Dental no notable dental hx. (+) Teeth Intact   Pulmonary asthma , sleep apnea and Continuous Positive Airway Pressure Ventilation , pneumonia, resolved, former smoker,    Pulmonary exam normal breath sounds clear to auscultation       Cardiovascular Exercise Tolerance: Poor hypertension, Pt. on medications Normal cardiovascular exam+ dysrhythmias Atrial Fibrillation III Rhythm:Regular Rate:Normal  Afib x7 years  Reports DOE after 10 feet Here for port  Denies DCCV in past    Neuro/Psych Anxiety Depression  Neuromuscular disease negative psych ROS   GI/Hepatic hiatal hernia, GERD  Medicated and Controlled,(+) Hepatitis -Fatty liver   Endo/Other  Morbid obesity  Renal/GU negative Renal ROS  negative genitourinary   Musculoskeletal  (+) Arthritis , Osteoarthritis,    Abdominal   Peds negative pediatric ROS (+)  Hematology negative hematology ROS (+)   Anesthesia Other Findings   Reproductive/Obstetrics negative OB ROS                             Anesthesia Physical Anesthesia Plan  ASA: IV  Anesthesia Plan: General   Post-op Pain Management:    Induction: Intravenous  PONV Risk Score and Plan: 3 and Propofol infusion, TIVA, Treatment may vary due to age or medical condition and Ondansetron  Airway Management Planned: Nasal Cannula and Simple Face Mask  Additional Equipment:   Intra-op Plan:   Post-operative Plan:   Informed Consent: I have reviewed the patients History and Physical, chart, labs and discussed the procedure including the risks,  benefits and alternatives for the proposed anesthesia with the patient or authorized representative who has indicated his/her understanding and acceptance.     Dental advisory given  Plan Discussed with: CRNA  Anesthesia Plan Comments: (Plan Full PPE use  Plan MAC with GETA as needed d/w pt -WTP with same after Q&A)        Anesthesia Quick Evaluation

## 2019-06-23 NOTE — Interval H&P Note (Signed)
History and Physical Interval Note:  06/23/2019 7:09 AM  Stephanie Jarvis  has presented today for surgery, with the diagnosis of Endometrial Cancer.  The various methods of treatment have been discussed with the patient and family. After consideration of risks, benefits and other options for treatment, the patient has consented to  Procedure(s): INSERTION PORT-A-CATH (Right) as a surgical intervention.  The patient's history has been reviewed, patient examined, no change in status, stable for surgery.  I have reviewed the patient's chart and labs.  Questions were answered to the patient's satisfaction.     Aviva Signs

## 2019-06-23 NOTE — Transfer of Care (Signed)
Immediate Anesthesia Transfer of Care Note  Patient: Stephanie Jarvis  Procedure(s) Performed: INSERTION PORT-A-CATH (Right Chest)  Patient Location: PACU  Anesthesia Type:MAC  Level of Consciousness: awake, alert , oriented and patient cooperative  Airway & Oxygen Therapy: Patient Spontanous Breathing  Post-op Assessment: Report given to RN, Post -op Vital signs reviewed and stable and Patient moving all extremities X 4  Post vital signs: Reviewed and stable  Last Vitals:  Vitals Value Taken Time  BP    Temp    Pulse 75 06/23/19 0813  Resp 23 06/23/19 0813  SpO2 97 % 06/23/19 0813  Vitals shown include unvalidated device data.  Last Pain:  Vitals:   06/23/19 0655  TempSrc: Oral  PainSc: 0-No pain      Patients Stated Pain Goal: 7 (16/96/78 [id number])  Complications: No apparent anesthesia complications

## 2019-06-23 NOTE — Op Note (Signed)
Patient:  Stephanie Jarvis  DOB:  08-21-1950  MRN:  [id number]   Preop Diagnosis: Endometrial carcinoma, need for central venous access  Postop Diagnosis: Same  Procedure: Port-A-Cath insertion  Surgeon: Aviva Signs, MD  Anes: MAC  Indications: Patient is a 68 year old white female who was referred to my care for Port-A-Cath insertion.  She is about to undergo chemotherapy for endometrial carcinoma.  The risks and benefits of the procedure including bleeding, infection, and pneumothorax were fully explained to the patient, who gave informed consent.  Procedure note: The patient was placed in Trendelenburg position after the right upper chest was prepped and draped using the usual sterile technique with ChloraPrep.  Surgical site confirmation was performed.  1% Xylocaine was used for local anesthesia.  An incision was made below the right clavicle.  A subcutaneous pocket was formed.  A needle was advanced into the right subclavian vein using the Seldinger technique without difficulty.  A guidewire was then advanced into the right atrium under fluoroscopic guidance.  An introducer and peel-away sheath were placed over the guidewire.  The catheter was inserted through the peel-away sheath and the peel-away sheath was removed.  The catheter was then attached to the port and the port placed in subcutaneous pocket.  Adequate positioning was confirmed by fluoroscopy.  Good backflow of venous blood was noted on aspiration of the port.  The port was flushed with heparin flush.  Subcutaneous layer was reapproximated using 3-0 Vicryl interrupted suture.  The skin was closed using a 4-0 Monocryl subcuticular suture.  Dermabond was applied.  All tape and needle counts were correct at the end of the procedure.  Patient was awakened and transferred to PACU in stable condition.  A chest x-ray will be performed at that time.  Complications: None  EBL: Minimal  Specimen: None

## 2019-06-23 NOTE — Anesthesia Postprocedure Evaluation (Signed)
Anesthesia Post Note  Patient: Stephanie Jarvis  Procedure(s) Performed: INSERTION PORT-A-CATH (Right Chest)  Patient location during evaluation: PACU Anesthesia Type: MAC Level of consciousness: awake, oriented, awake and alert and patient cooperative Pain management: pain level controlled Vital Signs Assessment: post-procedure vital signs reviewed and stable Respiratory status: spontaneous breathing, respiratory function stable and nonlabored ventilation Cardiovascular status: stable Postop Assessment: no apparent nausea or vomiting Anesthetic complications: no     Last Vitals:  Vitals:   06/23/19 0655  BP: 112/67  Pulse: 74  Resp: 16  Temp: 36.6 C  SpO2: 96%    Last Pain:  Vitals:   06/23/19 0655  TempSrc: Oral  PainSc: 0-No pain                 Yahmir Sokolov

## 2019-06-23 NOTE — Discharge Instructions (Signed)
Restart Eliquis tomorrow 06/24/19  Implanted [facility] Guide An implanted port is a device that is placed under the skin. It is usually placed in the chest. The device can be used to give IV medicine, to take blood, or for dialysis. You may have an implanted port if:  You need IV medicine that would be irritating to the small veins in your hands or arms.  You need IV medicines, such as antibiotics, for a long period of time.  You need IV nutrition for a long period of time.  You need dialysis. Having a port means that your health care provider will not need to use the veins in your arms for these procedures. You may have fewer limitations when using a port than you would if you used other types of long-term IVs, and you will likely be able to return to normal activities after your incision heals. An implanted port has two main parts:  Reservoir. The reservoir is the part where a needle is inserted to give medicines or draw blood. The reservoir is round. After it is placed, it appears as a small, raised area under your skin.  Catheter. The catheter is a thin, flexible tube that connects the reservoir to a vein. Medicine that is inserted into the reservoir goes into the catheter and then into the vein. How is my port accessed? To access your port:  A numbing cream may be placed on the skin over the port site.  Your health care provider will put on a mask and sterile gloves.  The skin over your port will be cleaned carefully with a germ-killing soap and allowed to dry.  Your health care provider will gently pinch the port and insert a needle into it.  Your health care provider will check for a blood return to make sure the port is in the vein and is not clogged.  If your port needs to remain accessed to get medicine continuously (constant infusion), your health care provider will place a clear bandage (dressing) over the needle site. The dressing and needle will need to be changed every  week, or as told by your health care provider. What is flushing? Flushing helps keep the port from getting clogged. Follow instructions from your health care provider about how and when to flush the port. Ports are usually flushed with saline solution or a medicine called heparin. The need for flushing will depend on how the port is used:  If the port is only used from time to time to give medicines or draw blood, the port may need to be flushed: ? Before and after medicines have been given. ? Before and after blood has been drawn. ? As part of routine maintenance. Flushing may be recommended every 4-6 weeks.  If a constant infusion is running, the port may not need to be flushed.  Throw away any syringes in a disposal container that is meant for sharp items (sharps container). You can buy a sharps container from a pharmacy, or you can make one by using an empty hard plastic bottle with a cover. How long will my port stay implanted? The port can stay in for as long as your health care provider thinks it is needed. When it is time for the port to come out, a surgery will be done to remove it. The surgery will be similar to the procedure that was done to put the port in. Follow these instructions at home:   Flush your port as told  by your health care provider.  If you need an infusion over several days, follow instructions from your health care provider about how to take care of your port site. Make sure you: ? Wash your hands with soap and water before you change your dressing. If soap and water are not available, use alcohol-based hand sanitizer. ? Change your dressing as told by your health care provider. ? Place any used dressings or infusion bags into a plastic bag. Throw that bag in the trash. ? Keep the dressing that covers the needle clean and dry. Do not get it wet. ? Do not use scissors or sharp objects near the tube. ? Keep the tube clamped, unless it is being used.  Check your  port site every day for signs of infection. Check for: ? Redness, swelling, or pain. ? Fluid or blood. ? Pus or a bad smell.  Protect the skin around the port site. ? Avoid wearing bra straps that rub or irritate the site. ? Protect the skin around your port from seat belts. Place a soft pad over your chest if needed.  Bathe or shower as told by your health care provider. The site may get wet as long as you are not actively receiving an infusion.  Return to your normal activities as told by your health care provider. Ask your health care provider what activities are safe for you.  Carry a medical alert card or wear a medical alert bracelet at all times. This will let health care providers know that you have an implanted port in case of an emergency. Get help right away if:  You have redness, swelling, or pain at the port site.  You have fluid or blood coming from your port site.  You have pus or a bad smell coming from the port site.  You have a fever. Summary  Implanted ports are usually placed in the chest for long-term IV access.  Follow instructions from your health care provider about flushing the port and changing bandages (dressings).  Take care of the area around your port by avoiding clothing that puts pressure on the area, and by watching for signs of infection.  Protect the skin around your port from seat belts. Place a soft pad over your chest if needed.  Get help right away if you have a fever or you have redness, swelling, pain, drainage, or a bad smell at the port site. This information is not intended to replace advice given to you by your health care provider. Make sure you discuss any questions you have with your health care provider. Document Released: 06/17/2005 Document Revised: 10/09/2018 Document Reviewed: 07/20/2016 Elsevier Patient Education  [place], Adult Taking care of your wound properly can help to prevent pain, infection,  and scarring. It can also help your wound to heal more quickly. How to care for your wound Wound care      Follow instructions from your health care provider about how to take care of your wound. Make sure you: ? Wash your hands with soap and water before you change the bandage (dressing). If soap and water are not available, use hand sanitizer. ? Change your dressing as told by your health care provider. ? Leave stitches (sutures), skin glue, or adhesive strips in place. These skin closures may need to stay in place for 2 weeks or longer. If adhesive strip edges start to loosen and curl up, you may trim the loose edges. Do not  remove adhesive strips completely unless your health care provider tells you to do that.  Check your wound area every day for signs of infection. Check for: ? Redness, swelling, or pain. ? Fluid or blood. ? Warmth. ? Pus or a bad smell.  Ask your health care provider if you should clean the wound with mild soap and water. Doing this may include: ? Using a clean towel to pat the wound dry after cleaning it. Do not rub or scrub the wound. ? Applying a cream or ointment. Do this only as told by your health care provider. ? Covering the incision with a clean dressing.  Ask your health care provider when you can leave the wound uncovered.  Keep the dressing dry until your health care provider says it can be removed. Do not take baths, swim, use a hot tub, or do anything that would put the wound underwater until your health care provider approves. Ask your health care provider if you can take showers. You may only be allowed to take sponge baths. Medicines   If you were prescribed an antibiotic medicine, cream, or ointment, take or use the antibiotic as told by your health care provider. Do not stop taking or using the antibiotic even if your condition improves.  Take over-the-counter and prescription medicines only as told by your health care provider. If you were  prescribed pain medicine, take it 30 or more minutes before you do any wound care or as told by your health care provider. General instructions  Return to your normal activities as told by your health care provider. Ask your health care provider what activities are safe.  Do not scratch or pick at the wound.  Do not use any products that contain nicotine or tobacco, such as cigarettes and e-cigarettes. These may delay wound healing. If you need help quitting, ask your health care provider.  Keep all follow-up visits as told by your health care provider. This is important.  Eat a diet that includes protein, vitamin A, vitamin C, and other nutrient-rich foods to help the wound heal. ? Foods rich in protein include meat, dairy, beans, nuts, and other sources. ? Foods rich in vitamin A include carrots and dark green, leafy vegetables. ? Foods rich in vitamin C include citrus, tomatoes, and other fruits and vegetables. ? Nutrient-rich foods have protein, carbohydrates, fat, vitamins, or minerals. Eat a variety of healthy foods including vegetables, fruits, and whole grains. Contact a health care provider if:  You received a tetanus shot and you have swelling, severe pain, redness, or bleeding at the injection site.  Your pain is not controlled with medicine.  You have redness, swelling, or pain around the wound.  You have fluid or blood coming from the wound.  Your wound feels warm to the touch.  You have pus or a bad smell coming from the wound.  You have a fever or chills.  You are nauseous or you vomit.  You are dizzy. Get help right away if:  You have a red streak going away from your wound.  The edges of the wound open up and separate.  Your wound is bleeding, and the bleeding does not stop with gentle pressure.  You have a rash.  You faint.  You have trouble breathing. Summary  Always wash your hands with soap and water before changing your bandage (dressing).  To  help with healing, eat foods that are rich in protein, vitamin A, vitamin C, and other nutrients.  Check your wound every day for signs of infection. Contact your health care provider if you suspect that your wound is infected. This information is not intended to replace advice given to you by your health care provider. Make sure you discuss any questions you have with your health care provider. Document Released: 03/26/2008 Document Revised: 10/05/2018 Document Reviewed: 01/02/2016 Elsevier Patient Education  2020 [employer].

## 2019-06-28 ENCOUNTER — Encounter (HOSPITAL_COMMUNITY): Payer: Self-pay

## 2019-06-28 DIAGNOSIS — Z95828 Presence of other vascular implants and grafts: Secondary | ICD-10-CM

## 2019-06-28 HISTORY — DX: Presence of other vascular implants and grafts: Z95.828

## 2019-06-28 MED ORDER — PROCHLORPERAZINE MALEATE 10 MG PO TABS: 10.0000 mg | ORAL_TABLET | Freq: Four times a day (QID) | ORAL | 1 refills | Status: DC | PRN

## 2019-06-28 MED ORDER — LIDOCAINE-PRILOCAINE 2.5-2.5 % EX CREA: TOPICAL_CREAM | CUTANEOUS | 3 refills | Status: DC

## 2019-06-28 NOTE — Patient Instructions (Addendum)
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 You will be treated every 3 weeks for 6 cycles with a combination of chemotherapy medications - paclitaxel (Taxol) and carboplatin.  The intent of treatment is cure.  You will see the doctor regularly throughout treatment.  We will obtain blood work from you prior to every treatment and monitor your results to make sure it is safe to give your treatment. The doctor monitors your response to treatment by the way you are feeling, your blood work, and by obtaining scans periodically.  There will be wait times while you are here for treatment.  It will take about 30 minutes to 1 hour for your lab work to result.  Then there will be wait times while pharmacy mixes your medications.    Medications you will receive in the clinic prior to your chemotherapy medications:  Aloxi:  ALOXI is used in adults to help prevent the nausea and vomiting that happens with certain chemotherapy drugs.  Aloxi is a long acting medication, and will remain in your system for about 2 days.   Emend:  This is an anti-nausea medication that is used with Aloxi to help prevent nausea and vomiting caused by chemotherapy.  Dexamethasone:  This is a steroid given prior to chemotherapy to help prevent allergic reactions; it may also help prevent and control nausea and diarrhea.   Pepcid:  This medication is a histamine blocker that helps prevent and allergic reaction to your chemotherapy.   Benadryl:  This is a histamine blocker (different from the Pepcid) that helps prevent allergic/infusion reactions to your chemotherapy. This medication may cause dizziness/drowsiness.    Paclitaxel (Taxol)  About This Drug  Paclitaxel is a drug used to treat cancer. It is given in the vein (IV).  This will take 3 hours to infuse.  This first infusion will take longer because it is increased slowly to monitor for reactions.   The nurse will be in the room with you for the first 15 minutes of the first infusion.  Possible Side Effects  . Hair loss. Hair loss is often temporary, although with certain medicine, hair loss can sometimes be permanent. Hair loss may happen suddenly or gradually. If you lose hair, you may lose it from your head, face, armpits, pubic area, chest, and/or legs. You may also notice your hair getting thin.  . Swelling of your legs, ankles and/or feet (edema)  . Flushing  . Nausea and throwing up (vomiting)  . Loose bowel movements (diarrhea)  . Bone marrow depression. This is a decrease in the number of white blood cells, red blood cells, and platelets. This may raise your risk of infection, make you tired and weak (fatigue), and raise your risk of bleeding.  . Effects on the nerves are called peripheral neuropathy. You may feel numbness, tingling, or pain in your hands and feet. It may be hard for you to button your clothes, open jars, or walk as usual. The effect on the nerves may get worse with more doses of the drug. These effects get better in some people after the drug is stopped but it does not get better in all people.  . Changes in your liver function  . Bone, joint and muscle pain  . Abnormal EKG  . Allergic reaction: Allergic reactions, including anaphylaxis are rare but may happen in some patients. Signs of allergic reaction to this drug may be swelling of the face, feeling  like your tongue or throat are swelling, trouble breathing, rash, itching, fever, chills, feeling dizzy, and/or feeling that your heart is beating in a fast or not normal way. If this happens, do not take another dose of this drug. You should get urgent medical treatment.  . Infection  . Changes in your kidney function.  Note: Each of the side effects above was reported in 20% or greater of patients treated with paclitaxel. Not all possible side effects are included above.  Warnings and Precautions  .  Severe allergic reactions  . Severe bone marrow depression  Treating Side Effects  . To help with hair loss, wash with a mild shampoo and avoid washing your hair every day.  . Avoid rubbing your scalp, instead, pat your hair or scalp dry  . Avoid coloring your hair  . Limit your use of hair spray, electric curlers, blow dryers, and curling irons.  . If you are interested in getting a wig, talk to your nurse. You can also call the [place] at 800-ACS-2345 to find out information about the "Look Good, Feel Better" program close to where you live. It is a free program where women getting chemotherapy can learn about wigs, turbans and scarves as well as makeup techniques and skin and nail care.  . Ask your doctor or nurse about medicines that are available to help stop or lessen diarrhea and/or nausea.  . To help with nausea and vomiting, eat small, frequent meals instead of three large meals a day. Choose foods and drinks that are at room temperature. Ask your nurse or doctor about other helpful tips and medicine that is available to help or stop lessen these symptoms.  . If you get diarrhea, eat low-fiber foods that are high in protein and calories and avoid foods that can irritate your digestive tracts or lead to cramping. Ask your nurse or doctor about medicine that can lessen or stop your diarrhea.  . Mouth care is very important. Your mouth care should consist of routine, gentle cleaning of your teeth or dentures and rinsing your mouth with a mixture of 1/2 teaspoon of salt in 8 ounces of water or  teaspoon of baking soda in 8 ounces of water. This should be done at least after each meal and at bedtime.  . If you have mouth sores, avoid mouthwash that has alcohol. Also avoid alcohol and smoking because they can bother your mouth and throat.  . Drink plenty of fluids (a minimum of eight glasses per day is recommended).  . Take your temperature as your doctor or nurse  tells you, and whenever you feel like you may have a fever.  . Talk to your doctor or nurse about precautions you can take to avoid infections and bleeding.  . Be careful when cooking, walking, and handling sharp objects and hot liquids.  Food and Drug Interactions  . There are no known interactions of paclitaxel with food.  . This drug may interact with other medicines. Tell your doctor and pharmacist about all the medicines and dietary supplements (vitamins, minerals, herbs and others) that you are taking at this time.  . The safety and use of dietary supplements and alternative diets are often not known. Using these might affect your cancer or interfere with your treatment. Until more is known, you should not use dietary supplements or alternative diets without your cancer doctor's help.  When to Call the Doctor  Call your doctor or nurse if you have any  of the following symptoms and/or any new or unusual symptoms:  . Fever of 100.4 F (38 C) or above  . Chills  . Redness, pain, warmth, or swelling at the IV site during the infusion  . Signs of allergic reaction: swelling of the face, feeling like your tongue or throat are swelling, trouble breathing, rash, itching, fever, chills, feeling dizzy, and/or feeling that your heart is beating in a fast or not normal way  . Feeling that your heart is beating in a fast or not normal way (palpitations)  . Weight gain of 5 pounds in one week (fluid retention)  . Decreased urine or very dark urine  . Signs of liver problems: dark urine, pale bowel movements, bad stomach pain, feeling very tired and weak, unusual  itching, or yellowing of the eyes or skin  . Heavy menstrual period that lasts longer than normal  . Easy bruising or bleeding  . Nausea that stops you from eating or drinking, and/or that is not relieved by prescribed medicines.  . Loose bowel movements (diarrhea) more than 4 times a day or diarrhea with weakness or  lightheadedness  . Pain in your mouth or throat that makes it hard to eat or drink  . Lasting loss of appetite or rapid weight loss of five pounds in a week  . Signs of peripheral neuropathy: numbness, tingling, or decreased feeling in fingers or toes; trouble walking or changes in the way you walk; or feeling clumsy when buttoning clothes, opening jars, or other routine activities  . Joint and muscle pain that is not relieved by prescribed medicines  . Extreme fatigue that interferes with normal activities  . While you are getting this drug, please tell your nurse right away if you have any pain, redness, or swelling at the site of the IV infusion.  . If you think you are pregnant.  Reproduction Warnings  . Pregnancy warning: This drug may have harmful effects on the unborn child, it is recommended that effective methods of birth control should be used during your cancer treatment. Let your doctor know right away if you think you may be pregnant.  . Breast feeding warning: Women should not breast feed during treatment because this drug could enter the breastmilk and cause harm to a breast feeding baby.   Carboplatin (Paraplatin, CBDCA)  About This Drug  Carboplatin is used to treat cancer. It is given in the vein through your port a cath.  It will take 30 minutes to infuse.    Possible Side Effects  . Bone marrow suppression. This is a decrease in the number of white blood cells, red blood cells, and platelets. This may raise your risk of infection, make you tired and weak (fatigue), and raise your risk of bleeding.  . Nausea and vomiting (throwing up)  . Weakness  . Changes in your liver function  . Changes in your kidney function  . Electrolyte changes  . Pain  Note: Each of the side effects above was reported in 20% or greater of patients treated with carboplatin. Not all possible side effects are included above.   Warnings and Precautions  . Severe bone marrow  suppression  . Allergic reactions, including anaphylaxis are rare but may happen in some patients. Signs of allergic reaction to this drug may be swelling of the face, feeling like your tongue or throat are swelling, trouble breathing, rash, itching, fever, chills, feeling dizzy, and/or feeling that your heart is beating in a fast or  not normal way. If this happens, do not take another dose of this drug. You should get urgent medical treatment.  . Severe nausea and vomiting  . Effects on the nerves are called peripheral neuropathy. This risk is increased if you are over the age of 73 or if you have received other medicine with risk of peripheral neuropathy. You may feel numbness, tingling, or pain in your hands and feet. It may be hard for you to button your clothes, open jars, or walk as usual. The effect on the nerves may get worse with more doses of the drug. These effects get better in some people after the drug is stopped but it does not get better in all people.  Marland Kitchen Blurred vision, loss of vision or other changes in eyesight  . Decreased hearing  . Skin and tissue irritation including redness, pain, warmth, or swelling at the IV site if the drug leaks out of the vein and into nearby tissue.  . Severe changes in your kidney function, which can cause kidney failure  . Severe changes in your liver function, which can cause liver failure  Note: Some of the side effects above are very rare. If you have concerns and/or questions, please discuss them with your medical team.  Important Information  . This drug may be present in the saliva, tears, sweat, urine, stool, vomit, semen, and vaginal secretions. Talk to your doctor and/or your nurse about the necessary precautions to take during this time.   Treating Side Effects  . Manage tiredness by pacing your activities for the day.  . Be sure to include periods of rest between energy-draining activities.  . To decrease the risk of  infection, wash your hands regularly.  . Avoid close contact with people who have a cold, the flu, or other infections.  . Take your temperature as your doctor or nurse tells you, and whenever you feel like you may have a fever.  . To help decrease the risk of bleeding, use a soft toothbrush. Check with your nurse before using dental floss.  . Be very careful when using knives or tools.  . Use an electric shaver instead of a razor.  . Drink plenty of fluids (a minimum of eight glasses per day is recommended).  . If you throw up or have loose bowel movements, you should drink more fluids so that you do not become dehydrated (lack of water in the body from losing too much fluid).  . To help with nausea and vomiting, eat small, frequent meals instead of three large meals a day.  Choose foods and drinks that are at room temperature. Ask your nurse or doctor about other helpful tips and medicine that is available to help stop or lessen these symptoms.  . If you have numbness and tingling in your hands and feet, be careful when cooking, walking, and handling sharp objects and hot liquids.  Marland Kitchen Keeping your pain under control is important to your well-being. Please tell your doctor or nurse if you are experiencing pain.   Food and Drug Interactions  . There are no known interactions of carboplatin with food.  . This drug may interact with other medicines. Tell your doctor and pharmacist about all the prescription and over-the-counter medicines and dietary supplements (vitamins, minerals, herbs and others) that you are taking at this time. Also, check with your doctor or pharmacist before starting any new prescription or over-the-counter medicines, or dietary supplements to make sure that there are no  interactions.   When to Call the Doctor  Call your doctor or nurse if you have any of these symptoms and/or any new or unusual symptoms:  . Fever of 100.4 F (38 C) or higher  . Chills  .  Tiredness that interferes with your daily activities  . Feeling dizzy or lightheaded  . Easy bleeding or bruising  . Nausea that stops you from eating or drinking and/or is not relieved by prescribed medicines  . Throwing up/vomiting  . Blurred vision or other changes in eyesight  . Decrease in hearing or ringing in the ear  . Signs of allergic reaction: swelling of the face, feeling like your tongue or throat are swelling, trouble breathing, rash, itching, fever, chills, feeling dizzy, and/or feeling that your heart is beating in a fast or not normal way. If this happens, call 911 for emergency care.  . While you are getting this drug, please tell your nurse right away if you have any pain, redness, or swelling at the site of the IV infusion  . Signs of possible liver problems: dark urine, pale bowel movements, bad stomach pain, feeling very tired and weak, unusual itching, or yellowing of the eyes or skin  . Decreased urine, or very dark urine  . Numbness, tingling, or pain in your hands and feet  . Pain that does not go away or is not relieved by prescribed medicine  . If you think you may be pregnant  Reproduction Warnings  . Pregnancy warning: This drug may have harmful effects on the unborn baby. Women of child bearing potential should use effective methods of birth control during your cancer treatment. Let your doctor know right away if you think you may be pregnant.  . Breastfeeding warning: It is not known if this drug passes into breast milk. For this reason, women should not breastfeed during treatment because this drug could enter the breast milk and cause harm to a breastfeeding baby.  . Fertility warning: Human fertility studies have not been done with this drug. Talk with your doctor or nurse if you plan to have children. Ask for information on sperm or egg banking.   SELF CARE ACTIVITIES WHILE RECEIVING CHEMOTHERAPY:  Hydration Increase your fluid intake 48  hours prior to treatment and drink at least 8 to 12 cups (64 ounces) of water/decaffeinated beverages per day after treatment. You can still have your cup of coffee or soda but these beverages do not count as part of your 8 to 12 cups that you need to drink daily. No alcohol intake.  Medications Continue taking your normal prescription medication as prescribed.  If you start any new herbal or new supplements please let us know first to make sure it is safe.  Mouth Care Have teeth cleaned professionally before starting treatment. Keep dentures and partial plates clean. Use soft toothbrush and do not use mouthwashes that contain alcohol. Biotene is a good mouthwash that is available at most pharmacies or may be ordered by calling [phone]. Use warm salt water gargles (1 teaspoon salt per 1 quart warm water) before and after meals and at bedtime. If you need dental work, please let the doctor know before you go for your appointment so that we can coordinate the best possible time for you in regards to your chemo regimen. You need to also let your dentist know that you are actively taking chemo. We may need to do labs prior to your dental appointment.  Skin Care Always use  sunscreen that has not expired and with SPF (Sun Protection Factor) of 50 or higher. Wear hats to protect your head from the sun. Remember to use sunscreen on your hands, ears, face, & feet.  Use good moisturizing lotions such as udder cream, eucerin, or even Vaseline. Some chemotherapies can cause dry skin, color changes in your skin and nails.    . Avoid long, hot showers or baths. . Use gentle, fragrance-free soaps and laundry detergent. . Use moisturizers, preferably creams or ointments rather than lotions because the thicker consistency is better at preventing skin dehydration. Apply the cream or ointment within 15 minutes of showering. Reapply moisturizer at night, and moisturize your hands every time after you wash  them.  Hair Loss (if your doctor says your hair will fall out)  . If your doctor says that your hair is likely to fall out, decide before you begin chemo whether you want to wear a wig. You may want to shop before treatment to match your hair color. . Hats, turbans, and scarves can also camouflage hair loss, although some people prefer to leave their heads uncovered. If you go bare-headed outdoors, be sure to use sunscreen on your scalp. . Cut your hair short. It eases the inconvenience of shedding lots of hair, but it also can reduce the emotional impact of watching your hair fall out. . Don't perm or color your hair during chemotherapy. Those chemical treatments are already damaging to hair and can enhance hair loss. Once your chemo treatments are done and your hair has grown back, it's OK to resume dyeing or perming hair.  With chemotherapy, hair loss is almost always temporary. But when it grows back, it may be a different color or texture. In older adults who still had hair color before chemotherapy, the new growth may be completely gray.  Often, new hair is very fine and soft.  Infection Prevention Please wash your hands for at least 30 seconds using warm soapy water. Handwashing is the #1 way to prevent the spread of germs. Stay away from sick people or people who are getting over a cold. If you develop respiratory systems such as green/yellow mucus production or productive cough or persistent cough let us know and we will see if you need an antibiotic. It is a good idea to keep a pair of gloves on when going into grocery stores/[employer] to decrease your risk of coming into contact with germs on the carts, etc. Carry alcohol hand gel with you at all times and use it frequently if out in public. If your temperature reaches 100.5 or higher please call the clinic and let us know.  If it is after hours or on the weekend please go to the ER if your temperature is over 100.5.  Please have your own  personal thermometer at home to use.    Sex and bodily fluids If you are going to have sex, a condom must be used to protect the person that isn't taking chemotherapy. Chemo can decrease your libido (sex drive). For a few days after chemotherapy, chemotherapy can be excreted through your bodily fluids.  When using the toilet please close the lid and flush the toilet twice.  Do this for a few day after you have had chemotherapy.   Effects of chemotherapy on your sex life Some changes are simple and won't last long. They won't affect your sex life permanently.  Sometimes you may feel: . too tired . not strong enough to be  very active . sick or sore  . not in the mood . anxious or low Your anxiety might not seem related to sex. For example, you may be worried about the cancer and how your treatment is going. Or you may be worried about money, or about how you family are coping with your illness. These things can cause stress, which can affect your interest in sex. It's important to talk to your partner about how you feel. Remember - the changes to your sex life don't usually last long. There's usually no medical reason to stop having sex during chemo. The drugs won't have any long term physical effects on your performance or enjoyment of sex. Cancer can't be passed on to your partner during sex.  Contraception It's important to use reliable contraception during treatment. Avoid getting pregnant while you or your partner are having chemotherapy. This is because the drugs may harm the baby. Sometimes chemotherapy drugs can leave a man or woman infertile.  This means you would not be able to have children in the future. You might want to talk to someone about permanent infertility. It can be very difficult to learn that you may no longer be able to have children. Some people find counselling helpful. There might be ways to preserve your fertility, although this is easier for men than for women. You may  want to speak to a fertility expert. You can talk about sperm banking or harvesting your eggs. You can also ask about other fertility options, such as donor eggs. If you have or have had breast cancer, your doctor might advise you not to take the contraceptive pill. This is because the hormones in it might affect the cancer.  It is not known for sure whether or not chemotherapy drugs can be passed on through semen or secretions from the vagina. Because of this some doctors advise people to use a barrier method if you have sex during treatment. This applies to vaginal, anal or oral sex. Generally, doctors advise a barrier method only for the time you are actually having the treatment and for about a week after your treatment. Advice like this can be worrying, but this does not mean that you have to avoid being intimate with your partner. You can still have close contact with your partner and continue to enjoy sex.  Animals If you have cats or birds we just ask that you not change the litter or change the cage.  Please have someone else do this for you while you are on chemotherapy.   Food Safety During and After Cancer Treatment Food safety is important for people both during and after cancer treatment. Cancer and cancer treatments, such as chemotherapy, radiation therapy, and stem cell/bone marrow transplantation, often weaken the immune system. This makes it harder for your body to protect itself from foodborne illness, also called food poisoning. Foodborne illness is caused by eating food that contains harmful bacteria, parasites, or viruses.  Foods to avoid Some foods have a higher risk of becoming tainted with bacteria. These include: Marland Kitchen Unwashed fresh fruit and vegetables, especially leafy vegetables that can hide dirt and other contaminants . Raw sprouts, such as alfalfa sprouts . Raw or undercooked beef, especially ground beef, or other raw or undercooked meat and poultry . Fatty, fried, or  spicy foods immediately before or after treatment.  These can sit heavy on your stomach and make you feel nauseous. . Raw or undercooked shellfish, such as oysters. . Sushi and sashimi,  which often contain raw fish.  . Unpasteurized beverages, such as unpasteurized fruit juices, raw milk, raw yogurt, or cider . Undercooked eggs, such as soft boiled, over easy, and poached; raw, unpasteurized eggs; or foods made with raw egg, such as homemade raw cookie dough and homemade mayonnaise  Simple steps for food safety  Shop smart. . Do not buy food stored or displayed in an unclean area. . Do not buy bruised or damaged fruits or vegetables. . Do not buy cans that have cracks, dents, or bulges. . Pick up foods that can spoil at the end of your shopping trip and store them in a cooler on the way home.  Prepare and clean up foods carefully. . Rinse all fresh fruits and vegetables under running water, and dry them with a clean towel or paper towel. . Clean the top of cans before opening them. . After preparing food, wash your hands for 20 seconds with hot water and soap. Pay special attention to areas between fingers and under nails. . Clean your utensils and dishes with hot water and soap. Marland Kitchen Disinfect your kitchen and cutting boards using 1 teaspoon of liquid, unscented bleach mixed into 1 quart of water.    Dispose of old food. . Eat canned and packaged food before its expiration date (the "use by" or "best before" date). . Consume refrigerated leftovers within 3 to 4 days. After that time, throw out the food. Even if the food does not smell or look spoiled, it still may be unsafe. Some bacteria, such as Listeria, can grow even on foods stored in the refrigerator if they are kept for too long.  Take precautions when eating out. . At restaurants, avoid buffets and salad bars where food sits out for a long time and comes in contact with many people. Food can become contaminated when someone with a  virus, often a norovirus, or another "bug" handles it. . Put any leftover food in a "to-go" container yourself, rather than having the server do it. And, refrigerate leftovers as soon as you get home. . Choose restaurants that are clean and that are willing to prepare your food as you order it cooked.   AT HOME MEDICATIONS:                                                                                                                                                                Compazine/Prochlorperazine 10mg  tablet. Take 1 tablet every 6 hours as needed for nausea/vomiting. (This can make you sleepy)   EMLA cream. Apply a quarter size amount to port site 1 hour prior to chemo. Do not rub in. Cover with plastic wrap.   Diarrhea Sheet   If you are having loose stools/diarrhea, please purchase Imodium and begin taking  as outlined:  At the first sign of poorly formed or loose stools you should begin taking Imodium (loperamide) 2 mg capsules. Take two tablets (4mg ) followed by one tablet (2mg ) every 2 hours - DO NOT EXCEED 8 tablets in 24 hours.  If it is bedtime and you are having loose stools, take 2 tablets at bedtime, then 2 tablets every 4 hours until morning.   Always call the [facility] if you are having loose stools/diarrhea that you can't get under control.  Loose stools/diarrhea leads to dehydration (loss of water) in your body.  We have other options of trying to get the loose stools/diarrhea to stop but you must let us know!  Constipation Sheet  Colace - 100 mg capsules - take 2 capsules daily.  If this doesn't help then you can increase to 2 capsules twice daily.  Please call if the above does not work for you. Do not go more than 2 days without a bowel movement.  It is very important that you do not become constipated.  It will make you feel sick to your stomach (nausea) and can cause abdominal pain and vomiting.  Nausea Sheet   Compazine/Prochlorperazine 10mg  tablet. Take 1  tablet every 6 hours as needed for nausea/vomiting (This can make you drowsy).  *If you are having persistent nausea (nausea that does not stop) please call the [facility] and let us know the amount of nausea that you are experiencing.  If you begin to vomit, you need to call the [facility] and if it is the weekend and you have vomited more than one time and can't get it to stop-go to the Emergency Room.  Persistent nausea/vomiting can lead to dehydration (loss of fluid in your body) and will make you feel very weak and unwell. Ice chips, sips of clear liquids, foods that are at room temperature, crackers, and toast tend to be better tolerated.   SYMPTOMS TO REPORT AS SOON AS POSSIBLE AFTER TREATMENT:  FEVER GREATER THAN 100.5 F  CHILLS WITH OR WITHOUT FEVER  NAUSEA AND VOMITING THAT IS NOT CONTROLLED WITH YOUR NAUSEA MEDICATION  UNUSUAL SHORTNESS OF BREATH  UNUSUAL BRUISING OR BLEEDING  TENDERNESS IN MOUTH AND THROAT WITH OR WITHOUT   PRESENCE OF ULCERS  URINARY PROBLEMS  BOWEL PROBLEMS  UNUSUAL RASH     Wear comfortable clothing and clothing appropriate for easy access to any Portacath or PICC line. Let us know if there is anything that we can do to make your therapy better!  What to do if you need assistance after hours or on the weekends: CALL [phone].  HOLD on the line, do not hang up.  You will hear multiple messages but at the end you will be connected with a nurse triage line.  They will contact the doctor if necessary.  Most of the time they will be able to assist you.  Do not call the hospital operator.    I have been informed and understand all of the instructions given to me and have received a copy. I have been instructed to call the clinic [phone] or my family physician as soon as possible for continued medical care, if indicated. I do not have any more questions at this time but understand that I may call the [facility] or the Patient Navigator at  [phone] during office hours should I have questions or need assistance in obtaining follow-up care.

## 2019-06-29 ENCOUNTER — Inpatient Hospital Stay (HOSPITAL_COMMUNITY): Payer: Medicare Other

## 2019-06-29 ENCOUNTER — Inpatient Hospital Stay (HOSPITAL_COMMUNITY): Payer: Medicare Other | Admitting: General Practice

## 2019-06-29 ENCOUNTER — Other Ambulatory Visit: Payer: Self-pay

## 2019-06-29 DIAGNOSIS — C541 Malignant neoplasm of endometrium: Secondary | ICD-10-CM

## 2019-06-29 DIAGNOSIS — Z95828 Presence of other vascular implants and grafts: Secondary | ICD-10-CM

## 2019-06-29 NOTE — Progress Notes (Signed)

## 2019-06-29 NOTE — Progress Notes (Signed)
[facility] Initial Psychosocial Assessment Clinical Social Work  Clinical Social Work contacted by phone to assess psychosocial, emotional, mental health, and spiritual needs of the patient.   Barriers to care/review of distress screen:  - Transportation:  Do you anticipate any problems getting to appointments?  Do you have someone who can help run errands for you if you need it?  Drives herself, has good support system who can assist.   - Help at home:  What is your living situation (alone, family, other)?  If you are physically unable to care for yourself, who would you call on to help you?   Lives w someone else in the home.  Is able to assist if needed - "we kind of take care of each other." - Support system:  What does your support system look like?  Who would you call on if you needed some kind of practical help?  What if you needed someone to talk to for emotional support?  Biggest support is Patsi Sears.  "I have all the emotional support I need from my daughter and granddaughter in [place]."  Grew up in [place], moved away after high school, returned to take care of mother and "I just stayed."   - Finances:  Are you concerned about finances  . Considering returning to work?  If not, applying for disability?  Retired, finances are "always tight" but has no concerns about paying bills.    What is your understanding of where you are with your cancer? Its cause?  Your treatment plan and what happens next?  Recently diagnosed w endometrial cancer, "I had been having a lot of pain and almost like a menstrual cycle - I am 68 and knew this was not right."  Had breast cancer twice in the past - had chemo/radiation the first time, 7 years later had surgery.  Will have chemo and radiation for this cancer - has already had hysterectomy.  Anticipates being able to get to [facility] for radiation, thinks that she will be able to get there without a problem.  Does not anticipate problems with chemotherapy.    What  are your worries for the future as you begin treatment for cancer?  "I guess I am worried about the outcome - I am older than I was the first time.  That worries me."  Worries about impact on daughter and grandchildren.  Husband passed away from cancer - "had to call hospice in after two treatments."  "I am a worrier anyway - I can come up w the worst case scenario."  "Its the sense of the unknown."  Worries about impact of her cancer on her grandchildren who are young adults.  Hopes to see children and grandchildren soon.     What are your hopes and priorities during your treatment? What is important to you? What are your goals for your care? "Just one day at a time."  "See what comes up."  No long term plans to go anywhere/do anything.  Would like to get back to church and other outside activities once pandemic is over.    What are you willing to sacrifice during your treatment?  Contact with other people during the pandemic - "I miss my family/friends."  Does keep in touch w people as she can.    What are you NOT willing to sacrifice during your treatment?  "I cant think of anything - I want to do what's best for me to have the best results from the chemo."  Wants to have quality of life during chemotherapy.  Feels husband's experience w chemo left his weak and lessened his quality and quantity of life.     CSW Summary:  Patient and family psychosocial functioning including strengths, limitations, and coping skills:  Newly diagnosed w endometrial cancer, patient has been treated twice in the past for breast cancer.  Husband died of lung cancer 10 years ago.  Lives w significant other who is very supportive and able to assist as needed.  Daughters grandchildren and great grandchildren live in [place] but are engaged by phone and supportive.   Has network of friends and church community.  Is limiting visits due to COVID precautions but remains engaged with others.  Some anxiety about starting  chemotherapy as it is "unknown" at this point, but also has experience w both chemo and radiation from past cancer treatments.  Concerned about impact of treatments on her quality and quantity of life, but willing to do "whatever it takes" to have successful treatment of her current cancer diagnosis.  No needs for additional support at this time.    Identifications of barriers to care:  None noted.  Availability of community resources:  Aware of community resources at [employer], has information on [employer] and [employer].    Clinical Social Worker follow up needed: No.   Please consult if needed.    Edwyna Shell, LCSW Clinical Social Worker Phone:  [phone] Cell:  [phone]

## 2019-07-05 ENCOUNTER — Encounter (HOSPITAL_COMMUNITY): Payer: Self-pay | Admitting: Hematology

## 2019-07-05 ENCOUNTER — Inpatient Hospital Stay (HOSPITAL_COMMUNITY): Payer: Medicare Other

## 2019-07-05 ENCOUNTER — Inpatient Hospital Stay (HOSPITAL_COMMUNITY): Payer: Medicare Other | Attending: Hematology

## 2019-07-05 ENCOUNTER — Other Ambulatory Visit: Payer: Self-pay

## 2019-07-05 ENCOUNTER — Inpatient Hospital Stay (HOSPITAL_BASED_OUTPATIENT_CLINIC_OR_DEPARTMENT_OTHER): Payer: Medicare Other | Admitting: Hematology

## 2019-07-05 ENCOUNTER — Encounter (HOSPITAL_COMMUNITY): Payer: Self-pay

## 2019-07-05 VITALS — BP 122/59 | HR 72 | Temp 97.6°F | Resp 18

## 2019-07-05 DIAGNOSIS — Z801 Family history of malignant neoplasm of trachea, bronchus and lung: Secondary | ICD-10-CM | POA: Diagnosis not present

## 2019-07-05 DIAGNOSIS — Z8349 Family history of other endocrine, nutritional and metabolic diseases: Secondary | ICD-10-CM | POA: Insufficient documentation

## 2019-07-05 DIAGNOSIS — F329 Major depressive disorder, single episode, unspecified: Secondary | ICD-10-CM | POA: Diagnosis not present

## 2019-07-05 DIAGNOSIS — Z841 Family history of disorders of kidney and ureter: Secondary | ICD-10-CM | POA: Insufficient documentation

## 2019-07-05 DIAGNOSIS — J45909 Unspecified asthma, uncomplicated: Secondary | ICD-10-CM | POA: Diagnosis not present

## 2019-07-05 DIAGNOSIS — R42 Dizziness and giddiness: Secondary | ICD-10-CM | POA: Insufficient documentation

## 2019-07-05 DIAGNOSIS — R0602 Shortness of breath: Secondary | ICD-10-CM | POA: Diagnosis not present

## 2019-07-05 DIAGNOSIS — Z836 Family history of other diseases of the respiratory system: Secondary | ICD-10-CM | POA: Insufficient documentation

## 2019-07-05 DIAGNOSIS — Z79899 Other long term (current) drug therapy: Secondary | ICD-10-CM | POA: Diagnosis not present

## 2019-07-05 DIAGNOSIS — Z8249 Family history of ischemic heart disease and other diseases of the circulatory system: Secondary | ICD-10-CM | POA: Diagnosis not present

## 2019-07-05 DIAGNOSIS — Z95828 Presence of other vascular implants and grafts: Secondary | ICD-10-CM

## 2019-07-05 DIAGNOSIS — Z7901 Long term (current) use of anticoagulants: Secondary | ICD-10-CM | POA: Insufficient documentation

## 2019-07-05 DIAGNOSIS — Z5189 Encounter for other specified aftercare: Secondary | ICD-10-CM | POA: Insufficient documentation

## 2019-07-05 DIAGNOSIS — R531 Weakness: Secondary | ICD-10-CM | POA: Insufficient documentation

## 2019-07-05 DIAGNOSIS — Z5111 Encounter for antineoplastic chemotherapy: Secondary | ICD-10-CM | POA: Diagnosis not present

## 2019-07-05 DIAGNOSIS — M791 Myalgia, unspecified site: Secondary | ICD-10-CM | POA: Diagnosis not present

## 2019-07-05 DIAGNOSIS — Z809 Family history of malignant neoplasm, unspecified: Secondary | ICD-10-CM | POA: Insufficient documentation

## 2019-07-05 DIAGNOSIS — I10 Essential (primary) hypertension: Secondary | ICD-10-CM | POA: Diagnosis not present

## 2019-07-05 DIAGNOSIS — Z823 Family history of stroke: Secondary | ICD-10-CM | POA: Insufficient documentation

## 2019-07-05 DIAGNOSIS — Z87891 Personal history of nicotine dependence: Secondary | ICD-10-CM | POA: Insufficient documentation

## 2019-07-05 DIAGNOSIS — C50112 Malignant neoplasm of central portion of left female breast: Secondary | ICD-10-CM

## 2019-07-05 DIAGNOSIS — Z170 Estrogen receptor positive status [ER+]: Secondary | ICD-10-CM

## 2019-07-05 DIAGNOSIS — R109 Unspecified abdominal pain: Secondary | ICD-10-CM | POA: Insufficient documentation

## 2019-07-05 DIAGNOSIS — Z8261 Family history of arthritis: Secondary | ICD-10-CM | POA: Diagnosis not present

## 2019-07-05 DIAGNOSIS — Z818 Family history of other mental and behavioral disorders: Secondary | ICD-10-CM | POA: Insufficient documentation

## 2019-07-05 DIAGNOSIS — Z853 Personal history of malignant neoplasm of breast: Secondary | ICD-10-CM | POA: Diagnosis not present

## 2019-07-05 DIAGNOSIS — Z90722 Acquired absence of ovaries, bilateral: Secondary | ICD-10-CM | POA: Insufficient documentation

## 2019-07-05 DIAGNOSIS — E876 Hypokalemia: Secondary | ICD-10-CM | POA: Insufficient documentation

## 2019-07-05 DIAGNOSIS — Z808 Family history of malignant neoplasm of other organs or systems: Secondary | ICD-10-CM | POA: Insufficient documentation

## 2019-07-05 DIAGNOSIS — C541 Malignant neoplasm of endometrium: Secondary | ICD-10-CM | POA: Diagnosis not present

## 2019-07-05 DIAGNOSIS — Z8049 Family history of malignant neoplasm of other genital organs: Secondary | ICD-10-CM | POA: Insufficient documentation

## 2019-07-05 DIAGNOSIS — Z800 Family history of malignant neoplasm of digestive organs: Secondary | ICD-10-CM | POA: Insufficient documentation

## 2019-07-05 DIAGNOSIS — Z8041 Family history of malignant neoplasm of ovary: Secondary | ICD-10-CM | POA: Insufficient documentation

## 2019-07-05 LAB — CBC WITH DIFFERENTIAL/PLATELET
Abs Immature Granulocytes: 0.01 10*3/uL (ref 0.00–0.07)
Basophils Absolute: 0.1 10*3/uL (ref 0.0–0.1)
Basophils Relative: 1 %
Eosinophils Absolute: 0.2 10*3/uL (ref 0.0–0.5)
Eosinophils Relative: 3 %
HCT: 35.5 % — ABNORMAL LOW (ref 36.0–46.0)
Hemoglobin: 11.5 g/dL — ABNORMAL LOW (ref 12.0–15.0)
Immature Granulocytes: 0 %
Lymphocytes Relative: 29 %
Lymphs Abs: 2 10*3/uL (ref 0.7–4.0)
MCH: 30.7 pg (ref 26.0–34.0)
MCHC: 32.4 g/dL (ref 30.0–36.0)
MCV: 94.7 fL (ref 80.0–100.0)
Monocytes Absolute: 0.7 10*3/uL (ref 0.1–1.0)
Monocytes Relative: 10 %
Neutro Abs: 3.9 10*3/uL (ref 1.7–7.7)
Neutrophils Relative %: 57 %
Platelets: 283 10*3/uL (ref 150–400)
RBC: 3.75 MIL/uL — ABNORMAL LOW (ref 3.87–5.11)
RDW: 13.9 % (ref 11.5–15.5)
WBC: 6.9 10*3/uL (ref 4.0–10.5)
nRBC: 0 % (ref 0.0–0.2)

## 2019-07-05 LAB — COMPREHENSIVE METABOLIC PANEL
ALT: 22 U/L (ref 0–44)
AST: 19 U/L (ref 15–41)
Albumin: 3.6 g/dL (ref 3.5–5.0)
Alkaline Phosphatase: 70 U/L (ref 38–126)
Anion gap: 11 (ref 5–15)
BUN: 13 mg/dL (ref 8–23)
CO2: 27 mmol/L (ref 22–32)
Calcium: 9.1 mg/dL (ref 8.9–10.3)
Chloride: 101 mmol/L (ref 98–111)
Creatinine, Ser: 0.72 mg/dL (ref 0.44–1.00)
GFR calc Af Amer: >60 mL/min (ref >60)
GFR calc non Af Amer: >60 mL/min (ref >60)
Glucose, Bld: 137 mg/dL — ABNORMAL HIGH (ref 70–99)
Potassium: 3.7 mmol/L (ref 3.5–5.1)
Sodium: 139 mmol/L (ref 135–145)
Total Bilirubin: 0.8 mg/dL (ref 0.3–1.2)
Total Protein: 7.1 g/dL (ref 6.5–8.1)

## 2019-07-05 MED ORDER — METHYLPREDNISOLONE SODIUM SUCC 125 MG IJ SOLR
125.0000 mg | Freq: Once | INTRAMUSCULAR | Status: AC
  Administered 2019-07-05: 125 mg via INTRAVENOUS

## 2019-07-05 MED ORDER — DIPHENHYDRAMINE HCL 50 MG/ML IJ SOLN
50.0000 mg | Freq: Once | INTRAMUSCULAR | Status: AC
  Administered 2019-07-05: 50 mg via INTRAVENOUS
  Filled 2019-07-05: qty 1

## 2019-07-05 MED ORDER — SODIUM CHLORIDE 0.9 % IV SOLN: Freq: Once | INTRAVENOUS | Status: AC

## 2019-07-05 MED ORDER — PALONOSETRON HCL INJECTION 0.25 MG/5ML
0.2500 mg | Freq: Once | INTRAVENOUS | Status: AC
  Administered 2019-07-05: 0.25 mg via INTRAVENOUS

## 2019-07-05 MED ORDER — SODIUM CHLORIDE 0.9 % IV SOLN
745.2000 mg | Freq: Once | INTRAVENOUS | Status: AC
  Administered 2019-07-05: 750 mg via INTRAVENOUS
  Filled 2019-07-05: qty 75

## 2019-07-05 MED ORDER — SODIUM CHLORIDE 0.9 % IV SOLN
175.0000 mg/m2 | Freq: Once | INTRAVENOUS | Status: AC
  Administered 2019-07-05: 414 mg via INTRAVENOUS
  Filled 2019-07-05: qty 69

## 2019-07-05 MED ORDER — FAMOTIDINE IN NACL 20-0.9 MG/50ML-% IV SOLN
20.0000 mg | Freq: Once | INTRAVENOUS | Status: AC
  Administered 2019-07-05: 20 mg via INTRAVENOUS

## 2019-07-05 MED ORDER — SODIUM CHLORIDE 0.9% FLUSH
10.0000 mL | INTRAVENOUS | Status: DC | PRN
  Administered 2019-07-05: 10 mL

## 2019-07-05 MED ORDER — SODIUM CHLORIDE 0.9 % IV SOLN
10.0000 mg | Freq: Once | INTRAVENOUS | Status: AC
  Administered 2019-07-05: 10 mg via INTRAVENOUS
  Filled 2019-07-05: qty 10

## 2019-07-05 MED ORDER — HEPARIN SOD (PORK) LOCK FLUSH 100 UNIT/ML IV SOLN
500.0000 [IU] | Freq: Once | INTRAVENOUS | Status: AC | PRN
  Administered 2019-07-05: 500 [IU]

## 2019-07-05 MED ORDER — SODIUM CHLORIDE 0.9 % IV SOLN
150.0000 mg | Freq: Once | INTRAVENOUS | Status: AC
  Administered 2019-07-05: 150 mg via INTRAVENOUS
  Filled 2019-07-05: qty 150

## 2019-07-05 MED ORDER — FAMOTIDINE IN NACL 20-0.9 MG/50ML-% IV SOLN
20.0000 mg | Freq: Once | INTRAVENOUS | Status: AC
  Administered 2019-07-05: 20 mg via INTRAVENOUS
  Filled 2019-07-05: qty 50

## 2019-07-05 MED ORDER — DIPHENHYDRAMINE HCL 50 MG/ML IJ SOLN
25.0000 mg | Freq: Once | INTRAMUSCULAR | Status: AC
  Administered 2019-07-05: 25 mg via INTRAVENOUS

## 2019-07-05 NOTE — Progress Notes (Signed)
Stephanie Jarvis, [place] [address]   CLINIC:  Medical Oncology/Hematology  PCP:  [name], MD [place] [address] [phone]   REASON FOR VISIT:  Follow-up for endometrioid adenocarcinoma.  CURRENT THERAPY: Carboplatin and paclitaxel.  BRIEF ONCOLOGIC HISTORY:  Oncology History  Breast cancer ([place])  12/08/2013 Procedure   Left needle core biopsy   12/09/2013 Pathology Results   Invasive ductal carcinoma with DCIS, ER 100%, PR 31%, Ki-67 marker 4%.  Insufficient material for HER2 testing.   12/28/2013 Breast MRI   Post biopsy changes located within the left breast laterally (upper-outer quadrant) related to the patient's recent stereotactic biopsy. Also, postsurgical scarring changes within the left breast. No areas of worrisome enhancement within the right breast.   01/26/2014 Definitive Surgery   Left modified radical mastectomy by Dr. Arnoldo Morale   02/01/2014 Pathology Results   Intermediate grade DCIS, 0.5 cm, 0/6 lymph nodes, negative resection margins.   03/13/2016 Imaging   Bone density- BMD as determined from Femur Neck Right is 1.023 g/cm2 with a T-Score of -0.1. This patient is considered normal according to [facility]) criteria.   Endometrial cancer ([place])  04/22/2019 Initial Biopsy   EMB - Gr1 EMCA   04/28/2019 Initial Diagnosis   Endometrial cancer determined by uterine biopsy ([place])   05/18/2019 Surgery   TRH/BSO, bilateral SLNs SLNs bulky.    Pathologic Stage   IIIC1, +LVSI, outer half MI, endometrioid adenoca, G 1-2 MSI-H   07/05/2019 -  Chemotherapy   The patient had palonosetron (ALOXI) injection 0.25 mg, 0.25 mg, Intravenous,  Once, 1 of 6 cycles pegfilgrastim-jmdb (FULPHILA) injection 6 mg, 6 mg, Subcutaneous,  Once, 1 of 6 cycles CARBOplatin (PARAPLATIN) 750 mg in sodium chloride 0.9 % 250 mL chemo infusion, 750 mg (100 % of original dose 745.2 mg), Intravenous,  Once,  1 of 6 cycles Dose modification:   (original dose 745.2 mg, Cycle 1) fosaprepitant (EMEND) 150 mg in sodium chloride 0.9 % 145 mL IVPB, 150 mg, Intravenous,  Once, 1 of 6 cycles PACLitaxel (TAXOL) 414 mg in sodium chloride 0.9 % 500 mL chemo infusion (> 50m/m2), 175 mg/m2 = 414 mg, Intravenous,  Once, 1 of 6 cycles  for chemotherapy treatment.       CANCER STAGING: Cancer Staging Breast cancer ([facility] Staging form: Breast, AJCC 7th Edition - Clinical stage from 02/01/2014: Stage 0 (Tis (DCIS), N0, M0) - Signed by KBaird Cancer PA-C on 12/01/2015  Endometrial cancer ([facility] Staging form: Corpus Uteri - Carcinoma and Carcinosarcoma, AJCC 8th Edition - Clinical stage from 05/18/2019: FIGO Stage IIIC1 (cT1b, cN1a(sn)) - Unsigned    INTERVAL HISTORY:  [name] female seen for initiation of chemotherapy for endometrioid adenocarcinoma.  She denies any tingling or numbness next 20s.  She denied any reactions to Taxotere when she received it for breast cancer.  Appetite is 75%.  Energy levels are 25%.  No pain is reported.  She had port placed on 06/23/2019 and recovered well.  Denies any fevers, night sweats or weight loss.  No nausea, vomiting or diarrhea.  She uses albuterol inhaler and nebulizer at home for asthma.    REVIEW OF SYSTEMS:  Review of Systems  All other systems reviewed and are negative.    PAST MEDICAL/SURGICAL HISTORY:  Past Medical History:  Diagnosis Date  . Ankle pain   . Anxiety   . Arthritis   . Asthma    Seasonal asthma  .  Breast cancer ([place]) 2008   ILC of Left breast; ER+  . Breast cancer ([place]) 2015   IDC+DCIS of Left breast; ER/PR+, Her2-, Ki67 = 4%  . Complication of anesthesia    Bad gag reflex  . Depression   . Dysrhythmia    A-fib  . Endometrial cancer ([place])   . GERD (gastroesophageal reflux disease)   . H/O hiatal hernia 2003  . Hepatitis    history in early 20's.  Marland Kitchen History of atrial fibrillation 2011  . History of blood  transfusion   . History of hepatitis A    Age 69  . History of pneumonia   . Hyperactive gag reflex   . Hyperlipidemia   . Hypertension   . Knee pain   . Obesity, Class III, BMI 40-49.9 (morbid obesity) ([place])   . Pneumonia   . Port-A-Cath in place 06/28/2019  . Sciatica   . Sleep apnea    cpap  . Vertigo    Past Surgical History:  Procedure Laterality Date  . BREAST LUMPECTOMY Left 1982,2008   x 2   . CHOLECYSTECTOMY N/A 12/31/2016   Procedure: LAPAROSCOPIC CHOLECYSTECTOMY WITH POSSIBLE INTRAOPERATIVE CHOLANGIOGRAM;  Surgeon: Rolm Bookbinder, MD;  Location: [facility];  Service: General;  Laterality: N/A;  . COLONOSCOPY    . EYE SURGERY     bil cataract surgery  . HIATAL HERNIA REPAIR  2003  . Lymph node removal  2008  . MASTECTOMY MODIFIED RADICAL Left 01/26/2014   Procedure: LEFT MODIFIED RADICAL MASTECTOMY;  Surgeon: Jamesetta So, MD;  Location: [facility];  Service: General;  Laterality: Left;  . PORT-A-CATH REMOVAL  2009 ish  . PORTA CATH INSERTION  [id number]  . PORTACATH PLACEMENT Right 06/23/2019   Procedure: INSERTION PORT-A-CATH;  Surgeon: Aviva Signs, MD;  Location: [facility];  Service: General;  Laterality: Right;  . ROBOTIC ASSISTED TOTAL HYSTERECTOMY WITH BILATERAL SALPINGO OOPHERECTOMY N/A 05/18/2019   Procedure: XI ROBOTIC ASSISTED TOTAL HYSTERECTOMY WITH BILATERAL SALPINGO OOPHORECTOMY;  Surgeon: Lafonda Mosses, MD;  Location: [facility];  Service: Gynecology;  Laterality: N/A;  . SENTINEL NODE BIOPSY N/A 05/18/2019   Procedure: SENTINEL LYMPH NODE BIOPSY;  Surgeon: Lafonda Mosses, MD;  Location: [facility];  Service: Gynecology;  Laterality: N/A;  . TOTAL KNEE ARTHROPLASTY Right 2006  . TOTAL KNEE ARTHROPLASTY  08/12/2011   Procedure: TOTAL KNEE ARTHROPLASTY;  Surgeon: Arther Abbott, MD;  Location: [facility];  Service: Orthopedics;  Laterality: Left;  . TUBAL LIGATION  1980     SOCIAL HISTORY:  Social History   Socioeconomic History  . Marital status: Widowed      Spouse name: Not on file  . Number of children: 2  . Years of education: 41  . Highest education level: Not on file  Occupational History  . Occupation: retired  Tobacco Use  . Smoking status: Former Smoker    Packs/day: 3.00    Years: 25.00    Pack years: 75.00    Types: Cigarettes    Quit date: 06/30/1997    Years since quitting: 22.0  . Smokeless tobacco: Never Used  Substance and Sexual Activity  . Alcohol use: Yes    Alcohol/week: 0.0 standard drinks    Comment: 2 glasses wine per month  . Drug use: No  . Sexual activity: Not Currently    Birth control/protection: Surgical    Comment: tubal  Other Topics Concern  . Not on file  Social History Narrative   Drinks 2-3 cups of coffee a  day    Social Determinants of Health   Financial Resource Strain:   . Difficulty of Paying Living Expenses: Not on file  Food Insecurity:   . Worried About Charity fundraiser in the Last Year: Not on file  . Ran Out of Food in the Last Year: Not on file  Transportation Needs:   . Lack of Transportation (Medical): Not on file  . Lack of Transportation (Non-Medical): Not on file  Physical Activity:   . Days of Exercise per Week: Not on file  . Minutes of Exercise per Session: Not on file  Stress:   . Feeling of Stress : Not on file  Social Connections:   . Frequency of Communication with Friends and Family: Not on file  . Frequency of Social Gatherings with Friends and Family: Not on file  . Attends Religious Services: Not on file  . Active Member of Clubs or Organizations: Not on file  . Attends Archivist Meetings: Not on file  . Marital Status: Not on file  Intimate Partner Violence:   . Fear of Current or Ex-Partner: Not on file  . Emotionally Abused: Not on file  . Physically Abused: Not on file  . Sexually Abused: Not on file    FAMILY HISTORY:  Family History  Problem Relation Age of Onset  . Stroke Father   . Heart disease Father   . Skin cancer Sister         dx. 64-58; unknown type  . Throat cancer Maternal Uncle 75       smoker; voicebox removed  . Alzheimer's disease Paternal Grandmother   . Heart attack Paternal Grandfather   . Alzheimer's disease Maternal Aunt   . Heart disease Maternal Uncle   . Lung cancer Cousin        smoker  . Lung cancer Paternal Aunt        dx. mid-70s; worked at [employer]  . Heart attack Daughter        caused by thyroid issues  . Heart disease Other   . Arthritis Other   . Lung disease Other   . Cancer Other   . Kidney disease Other   . Colon cancer Other   . Ovarian cancer Other   . Endometrial cancer Other     CURRENT MEDICATIONS:  Outpatient Encounter Medications as of 07/05/2019  Medication Sig Note  . albuterol (PROVENTIL) (2.5 MG/3ML) 0.083% nebulizer solution Take 3 mLs by nebulization 4 (four) times daily as needed for wheezing or shortness of breath.    Marland Kitchen albuterol (VENTOLIN HFA) 108 (90 Base) MCG/ACT inhaler Inhale 1-2 puffs into the lungs every 6 (six) hours as needed for wheezing or shortness of breath.   Marland Kitchen apixaban (ELIQUIS) 5 MG TABS tablet Take 1 tablet (5 mg total) by mouth 2 (two) times daily. 06/21/2019: Last dose was 06/20/2019. On hold for surgery.  Marland Kitchen buPROPion (WELLBUTRIN XL) 300 MG 24 hr tablet Take 300 mg by mouth daily.   . Calcium Carbonate-Vit D-Min (CALCIUM 1200 PO) Take 1 tablet by mouth daily.    Marland Kitchen CARBOPLATIN IV Inject into the vein every 21 ( twenty-one) days.   . fexofenadine (ALLEGRA) 180 MG tablet Take 180 mg by mouth daily.   . furosemide (LASIX) 40 MG tablet Take 1 tablet (40 mg total) by mouth 2 (two) times daily.   Marland Kitchen ibuprofen (ADVIL) 600 MG tablet Take 1 tablet (600 mg total) by mouth every 6 (six) hours as needed for  moderate pain. For AFTER surgery   . lidocaine-prilocaine (EMLA) cream Apply a small amount to port a cath site and cover with plastic wrap 1 hour prior to chemotherapy appointments   . metoprolol tartrate (LOPRESSOR) 25 MG tablet Take 1  tablet (25 mg total) by mouth 2 (two) times daily.   Marland Kitchen omeprazole (PRILOSEC) 20 MG capsule Take 20 mg by mouth 2 (two) times daily.    Marland Kitchen PACLITAXEL IV Inject into the vein every 21 ( twenty-one) days.   . potassium chloride SA (KLOR-CON M20) 20 MEQ tablet Take 2 tablets (40 mEq total) by mouth every morning. & 20 meq in the evening (Patient taking differently: Take 20 mEq by mouth 2 (two) times daily. )   . prochlorperazine (COMPAZINE) 10 MG tablet Take 1 tablet (10 mg total) by mouth every 6 (six) hours as needed (Nausea or vomiting).   . rosuvastatin (CRESTOR) 20 MG tablet Take 20 mg by mouth at bedtime.    . sertraline (ZOLOFT) 100 MG tablet Take 100 mg by mouth at bedtime.   . traMADol (ULTRAM) 50 MG tablet Take 1 tablet (50 mg total) by mouth every 6 (six) hours as needed for severe pain.    No facility-administered encounter medications on file as of 07/05/2019.    ALLERGIES:  No Known Allergies   PHYSICAL EXAM:  ECOG Performance status: 1  Vitals:   07/05/19 0820  BP: 133/61  Pulse: 82  Resp: 18  Temp: 97.6 F (36.4 C)  SpO2: 96%   Filed Weights   07/05/19 0820  Weight: 261 lb 9.6 oz (118.7 kg)    Physical Exam Vitals reviewed.  Constitutional:      Appearance: Normal appearance.  Cardiovascular:     Rate and Rhythm: Normal rate and regular rhythm.     Heart sounds: Normal heart sounds.  Pulmonary:     Effort: Pulmonary effort is normal.     Breath sounds: Normal breath sounds.  Abdominal:     General: There is no distension.     Palpations: Abdomen is soft. There is no mass.  Musculoskeletal:        General: No swelling.  Skin:    General: Skin is warm.  Neurological:     General: No focal deficit present.     Mental Status: She is alert and oriented to person, place, and time.  Psychiatric:        Mood and Affect: Mood normal.        Behavior: Behavior normal.      LABORATORY DATA:  I have reviewed the labs as listed.  CBC    Component Value  Date/Time   WBC 6.9 07/05/2019 0830   RBC 3.75 (L) 07/05/2019 0830   HGB 11.5 (L) 07/05/2019 0830   HCT 35.5 (L) 07/05/2019 0830   PLT 283 07/05/2019 0830   MCV 94.7 07/05/2019 0830   MCH 30.7 07/05/2019 0830   MCHC 32.4 07/05/2019 0830   RDW 13.9 07/05/2019 0830   LYMPHSABS 2.0 07/05/2019 0830   MONOABS 0.7 07/05/2019 0830   EOSABS 0.2 07/05/2019 0830   BASOSABS 0.1 07/05/2019 0830   CMP Latest Ref Rng & Units 07/05/2019 06/21/2019 05/13/2019  Glucose 70 - 99 mg/dL 137(H) 104(H) 102(H)  BUN 8 - 23 mg/dL 13 11 10   Creatinine 0.44 - 1.00 mg/dL 0.72 0.90 0.75  Sodium 135 - 145 mmol/L 139 140 141  Potassium 3.5 - 5.1 mmol/L 3.7 3.9 3.9  Chloride 98 - 111 mmol/L 101 104 102  CO2 22 - 32 mmol/L 27 26 27   Calcium 8.9 - 10.3 mg/dL 9.1 9.0 9.2  Total Protein 6.5 - 8.1 g/dL 7.1 6.9 7.4  Total Bilirubin 0.3 - 1.2 mg/dL 0.8 0.7 0.8  Alkaline Phos 38 - 126 U/L 70 73 80  AST 15 - 41 U/L 19 21 23   ALT 0 - 44 U/L 22 18 23        DIAGNOSTIC IMAGING:  I have independently reviewed the scans and discussed with the patient.     ASSESSMENT & PLAN:   Endometrial cancer ([place]) 1.  Stage IIIC1 (pT1BpN1A) endometrioid adenocarcinoma: -She was having postmenopausal vaginal bleeding since July/August 2020. -She was evaluated by Dr. Glo Herring in October, endometrial biopsy on 04/22/2019 showed endometrioid adenocarcinoma. -She was evaluated by Dr. Berline Lopes and underwent laparoscopic TAH, BSO, lymph node biopsy on 05/18/2019. -Pathology showed endometrioid adenocarcinoma, 5.5 cm, carcinoma focally invades outer half of the myometrium, lymphovascular space involved by tumor, margins negative, metastatic adenocarcinoma in the right and left external iliac sentinel lymph nodes.  FIGO grade 1/2.  Depth of myometrial invasion 9 mm, myometrial thickness 12 mm.  Pathological stage is pT1b, PN 1A. -Loss of nuclear expression of MLH1 and PMS2 present.  MSI-high.  MLH1 hyper methylation present indicating high  chance of sporadic endometrial cancer. -CT CAP on 06/02/2019 did not show any evidence of metastatic disease.  0.9 cm hypodensity in the anterior right liver is consistent with a cyst. -Her case was discussed at tumor board and 6 cycles of chemotherapy followed by vaginal brachytherapy was recommended. -She had a port placed on 06/23/2019. -We have reviewed her labs.  I have discussed about the side effects of chemotherapy regimen in detail. -She will proceed with her first cycle today.  She will be seen in 1 week for toxicity assessment.  2.  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  Oncotype DX score was high at 35.  She received 4 cycles of TC completed on 02/12/2007.  This was followed by adjuvant radiation therapy.  She took 5 years of anastrozole. -She had intermediate grade DCIS in the left breast in 2015 and underwent mastectomy.  She was again treated with 5 years of anastrozole which was completed in June 2020. -Right breast mammogram on 11/16/2018 was BI-RADS Category 1.  3.  Atrial fibrillation: -She is on Eliquis.  She is tolerating it very well.  Addendum: -30 minutes after starting her Taxol infusion, she developed itching behind her ears and in her feet while she was in the restroom as she developed cramping in her abdomen.  She did not have any bowel movement.  She felt difficulty breathing at that time.  Lungs were clear to auscultation.  Blood pressure did not fall.  Saturations were above 90%.  We have given additional dose of Benadryl 25 mg followed by Solu-Medrol 125 mg and Pepcid 20 mg. -We have observed for the next hour.  Once she has been back to normal, physical examination was repeated with normal lung sounds.  I have reviewed her prior medication history and medical history.  She uses albuterol inhaler/nebulizer once or twice a day at home.  We have opted to slowly start back on paclitaxel.  She has tolerated  it very well and the total infusion was completed.  Total time spent is 45 minutes with more than 80% time spent face-to-face.    Orders placed this encounter:  Orders Placed This Encounter  Procedures  . CBC with Differential/Platelet  . Comprehensive metabolic panel  . Magnesium      Derek Jack, MD [place] [phone]

## 2019-07-05 NOTE — Assessment & Plan Note (Addendum)
1.  Stage IIIC1 (pT1BpN1A) endometrioid adenocarcinoma: -She was having postmenopausal vaginal bleeding since July/August 2020. -She was evaluated by Dr. Glo Herring in October, endometrial biopsy on 04/22/2019 showed endometrioid adenocarcinoma. -She was evaluated by Dr. Berline Lopes and underwent laparoscopic TAH, BSO, lymph node biopsy on 05/18/2019. -Pathology showed endometrioid adenocarcinoma, 5.5 cm, carcinoma focally invades outer half of the myometrium, lymphovascular space involved by tumor, margins negative, metastatic adenocarcinoma in the right and left external iliac sentinel lymph nodes.  FIGO grade 1/2.  Depth of myometrial invasion 9 mm, myometrial thickness 12 mm.  Pathological stage is pT1b, PN 1A. -Loss of nuclear expression of MLH1 and PMS2 present.  MSI-high.  MLH1 hyper methylation present indicating high chance of sporadic endometrial cancer. -CT CAP on 06/02/2019 did not show any evidence of metastatic disease.  0.9 cm hypodensity in the anterior right liver is consistent with a cyst. -Her case was discussed at tumor board and 6 cycles of chemotherapy followed by vaginal brachytherapy was recommended. -She had a port placed on 06/23/2019. -We have reviewed her labs.  I have discussed about the side effects of chemotherapy regimen in detail. -She will proceed with her first cycle today.  She will be seen in 1 week for toxicity assessment.  2.  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  Oncotype DX score was high at 35.  She received 4 cycles of TC completed on 02/12/2007.  This was followed by adjuvant radiation therapy.  She took 5 years of anastrozole. -She had intermediate grade DCIS in the left breast in 2015 and underwent mastectomy.  She was again treated with 5 years of anastrozole which was completed in June 2020. -Right breast mammogram on 11/16/2018 was BI-RADS Category 1.  3.  Atrial fibrillation: -She is on  Eliquis.  She is tolerating it very well.  Addendum: -30 minutes after starting her Taxol infusion, she developed itching behind her ears and in her feet while she was in the restroom as she developed cramping in her abdomen.  She did not have any bowel movement.  She felt difficulty breathing at that time.  Lungs were clear to auscultation.  Blood pressure did not fall.  Saturations were above 90%.  We have given additional dose of Benadryl 25 mg followed by Solu-Medrol 125 mg and Pepcid 20 mg. -We have observed for the next hour.  Once she has been back to normal, physical examination was repeated with normal lung sounds.  I have reviewed her prior medication history and medical history.  She uses albuterol inhaler/nebulizer once or twice a day at home.  We have opted to slowly start back on paclitaxel.  She has tolerated it very well and the total infusion was completed.

## 2019-07-05 NOTE — Patient Instructions (Addendum)
[place] at [facility] Discharge Instructions  You were seen today by Dr. Delton Coombes. He went over your recent lab results. He will see you back in 1 week for labs, possible fluids and follow up.   Thank you for choosing [facility] at [facility] to provide your oncology and hematology care.  To afford each patient quality time with our provider, please arrive at least 15 minutes before your scheduled appointment time.   If you have a lab appointment with the [facility] please come in thru the  Main Entrance and check in at the main information desk  You need to re-schedule your appointment should you arrive 10 or more minutes late.  We strive to give you quality time with our providers, and arriving late affects you and other patients whose appointments are after yours.  Also, if you no show three or more times for appointments you may be dismissed from the clinic at the providers discretion.     Again, thank you for choosing [facility].  Our hope is that these requests will decrease the amount of time that you wait before being seen by our physicians.       _____________________________________________________________  Should you have questions after your visit to [facility], please contact our office at [phone] between the hours of 8:00 a.m. and 4:30 p.m.  Voicemails left after 4:00 p.m. will not be returned until the following business day.  For prescription refill requests, have your pharmacy contact our office and allow 72 hours.    Cancer Center Support Programs:   > Cancer Support Group  2nd Tuesday of the month 1pm-2pm, Journey Room

## 2019-07-05 NOTE — Progress Notes (Signed)
Labs reviewed with MD today. Proceed today with Cycle one,day one.   1208-patient was in bathroom and rang call bell, complaining of itching in her ears, her feet, then complained of feeling "light headed, gonna pass out". Taxol immediately stopped, MD and additional staff notified. Patient states she cannot get up at this moment, complaining of back and neck pain, feels Short of breath. Emergency medications given per MD as well as non-rebreather oxygen mask.  Assisted patient to wheelchair and back to bed. Patient states she is starting to feel better. O2 nasal canula applied on 2 liters. See flow sheet for vitals.    1230 patient states when she inhales she feels a little tight in her chest. VS stable 109/63, HR 76, 96 on 2Liters of oxygen. States she is just feeling real weak.   1300-MD to bedside to re-evaluate patient post reaction. Vitals stable, lungs clear per MD. Patient states she "feels normal ". O2 nasal canula applied on 2 liters. See flow sheet for vitals.    1305-Re-challenged patient again at the beginning rate of taxol per MD.  1320-oxygen 92% on RA, patient is sleeping. Will reapply 2 liters [place] at this time.  Oxygen removed and O2 sats are 92-93% on RA.  Late entry- Treatment given per orders. Patient tolerated it well without problems. Vitals stable and discharged home from clinic via wheelchair. Follow up as scheduled.

## 2019-07-06 ENCOUNTER — Telehealth (HOSPITAL_COMMUNITY): Payer: Self-pay

## 2019-07-06 NOTE — Telephone Encounter (Signed)
24 hour follow up- patient states she is feeling a little anxious today, tired and  states she has some congestion in her chest. She felt better after her breathing treatment this morning and is going to take another one this afternoon. She took two of her fluid pills this morning as well. She has eaten some bacon, toast and yogurt today as well. She is coming into clinic tomorrow for her shot and we can further evaluate how she feels then.

## 2019-07-07 ENCOUNTER — Encounter (HOSPITAL_COMMUNITY): Payer: Self-pay

## 2019-07-07 ENCOUNTER — Other Ambulatory Visit: Payer: Self-pay

## 2019-07-07 ENCOUNTER — Inpatient Hospital Stay (HOSPITAL_COMMUNITY): Payer: Medicare Other

## 2019-07-07 VITALS — BP 133/60 | HR 81 | Temp 97.9°F | Resp 18

## 2019-07-07 DIAGNOSIS — C541 Malignant neoplasm of endometrium: Secondary | ICD-10-CM | POA: Diagnosis not present

## 2019-07-07 DIAGNOSIS — R42 Dizziness and giddiness: Secondary | ICD-10-CM | POA: Diagnosis not present

## 2019-07-07 DIAGNOSIS — Z95828 Presence of other vascular implants and grafts: Secondary | ICD-10-CM

## 2019-07-07 DIAGNOSIS — Z5189 Encounter for other specified aftercare: Secondary | ICD-10-CM | POA: Diagnosis not present

## 2019-07-07 DIAGNOSIS — R531 Weakness: Secondary | ICD-10-CM | POA: Diagnosis not present

## 2019-07-07 DIAGNOSIS — Z5111 Encounter for antineoplastic chemotherapy: Secondary | ICD-10-CM | POA: Diagnosis not present

## 2019-07-07 DIAGNOSIS — M791 Myalgia, unspecified site: Secondary | ICD-10-CM | POA: Diagnosis not present

## 2019-07-07 MED ORDER — PEGFILGRASTIM-JMDB 6 MG/0.6ML ~~LOC~~ SOSY
6.0000 mg | PREFILLED_SYRINGE | Freq: Once | SUBCUTANEOUS | Status: AC
  Administered 2019-07-07: 6 mg via SUBCUTANEOUS
  Filled 2019-07-07: qty 0.6

## 2019-07-07 NOTE — Progress Notes (Signed)
Patient having problems with "shaky" hands since treatment on Monday.  Stated it is worse today than yesterday.  No s/s of distress noted.  Reynolds Bowl, NP, notified and to see the patient.    Patient seen by Reynolds Bowl, NP, with no orders received.  Patient stated she was instructed by the NP to call if the symptoms worsen with understanding verbalized.  Patient remembered after visit she has taken breathing treatment and this may be the reason why her hands are shaking.  No s/s of distress noted.   Patient tolerated injection with no complaints voiced.  Site clean and dry with no bruising or swelling noted at site.  Band aid applied.  Vss with discharge and left ambulatory with no s/s of distress noted.

## 2019-07-09 ENCOUNTER — Telehealth (HOSPITAL_COMMUNITY): Payer: Self-pay | Admitting: *Deleted

## 2019-07-09 NOTE — Telephone Encounter (Signed)
Pt phoned the clinic c/o body aches and pains, legs cramping, after receiving GCSF injection on 07/07/19.  She reports she has taken Tylenol and Advil with no relief.  Pt has Tramadol that was prescribed after port placement for pain.  Advised to take the Tramadol as prescribed for pain and to also take Claritin 10mg  daily for the next few days.  Advised to start the Claritin again a few days prior to next injection to hopefully prevent bone pain and body aches.  Advised to call the clinic if her pain continues after using Tramadol and Claritin.  Pt verbalizes understanding of instructions.

## 2019-07-13 ENCOUNTER — Encounter (HOSPITAL_COMMUNITY): Payer: Self-pay

## 2019-07-13 ENCOUNTER — Encounter (HOSPITAL_COMMUNITY): Payer: Self-pay | Admitting: Hematology

## 2019-07-13 ENCOUNTER — Other Ambulatory Visit (HOSPITAL_COMMUNITY): Payer: Self-pay | Admitting: Hematology

## 2019-07-13 ENCOUNTER — Other Ambulatory Visit: Payer: Self-pay

## 2019-07-13 ENCOUNTER — Inpatient Hospital Stay (HOSPITAL_BASED_OUTPATIENT_CLINIC_OR_DEPARTMENT_OTHER): Payer: Medicare Other | Admitting: Hematology

## 2019-07-13 ENCOUNTER — Inpatient Hospital Stay (HOSPITAL_COMMUNITY): Payer: Medicare Other

## 2019-07-13 ENCOUNTER — Other Ambulatory Visit (HOSPITAL_COMMUNITY): Payer: Self-pay | Admitting: *Deleted

## 2019-07-13 VITALS — BP 132/42 | HR 76 | Temp 98.1°F | Resp 18

## 2019-07-13 VITALS — BP 131/73 | HR 84 | Temp 97.4°F | Resp 18 | Wt 257.4 lb

## 2019-07-13 DIAGNOSIS — Z5111 Encounter for antineoplastic chemotherapy: Secondary | ICD-10-CM | POA: Diagnosis not present

## 2019-07-13 DIAGNOSIS — M791 Myalgia, unspecified site: Secondary | ICD-10-CM | POA: Diagnosis not present

## 2019-07-13 DIAGNOSIS — E876 Hypokalemia: Secondary | ICD-10-CM | POA: Insufficient documentation

## 2019-07-13 DIAGNOSIS — C541 Malignant neoplasm of endometrium: Secondary | ICD-10-CM | POA: Diagnosis not present

## 2019-07-13 DIAGNOSIS — R531 Weakness: Secondary | ICD-10-CM | POA: Diagnosis not present

## 2019-07-13 DIAGNOSIS — R42 Dizziness and giddiness: Secondary | ICD-10-CM | POA: Diagnosis not present

## 2019-07-13 DIAGNOSIS — Z5189 Encounter for other specified aftercare: Secondary | ICD-10-CM | POA: Diagnosis not present

## 2019-07-13 LAB — COMPREHENSIVE METABOLIC PANEL
ALT: 23 U/L (ref 0–44)
AST: 22 U/L (ref 15–41)
Albumin: 3.4 g/dL — ABNORMAL LOW (ref 3.5–5.0)
Alkaline Phosphatase: 73 U/L (ref 38–126)
Anion gap: 10 (ref 5–15)
BUN: 13 mg/dL (ref 8–23)
CO2: 28 mmol/L (ref 22–32)
Calcium: 8.2 mg/dL — ABNORMAL LOW (ref 8.9–10.3)
Chloride: 100 mmol/L (ref 98–111)
Creatinine, Ser: 0.73 mg/dL (ref 0.44–1.00)
GFR calc Af Amer: >60 mL/min (ref >60)
GFR calc non Af Amer: >60 mL/min (ref >60)
Glucose, Bld: 160 mg/dL — ABNORMAL HIGH (ref 70–99)
Potassium: 3.2 mmol/L — ABNORMAL LOW (ref 3.5–5.1)
Sodium: 138 mmol/L (ref 135–145)
Total Bilirubin: 0.6 mg/dL (ref 0.3–1.2)
Total Protein: 6.6 g/dL (ref 6.5–8.1)

## 2019-07-13 LAB — MAGNESIUM: Magnesium: 0.8 mg/dL — CL (ref 1.7–2.4)

## 2019-07-13 LAB — CBC WITH DIFFERENTIAL/PLATELET
Abs Immature Granulocytes: 0.05 10*3/uL (ref 0.00–0.07)
Basophils Absolute: 0.1 10*3/uL (ref 0.0–0.1)
Basophils Relative: 1 %
Eosinophils Absolute: 0.2 10*3/uL (ref 0.0–0.5)
Eosinophils Relative: 5 %
HCT: 33.8 % — ABNORMAL LOW (ref 36.0–46.0)
Hemoglobin: 11.1 g/dL — ABNORMAL LOW (ref 12.0–15.0)
Immature Granulocytes: 1 %
Lymphocytes Relative: 39 %
Lymphs Abs: 1.9 10*3/uL (ref 0.7–4.0)
MCH: 30.8 pg (ref 26.0–34.0)
MCHC: 32.8 g/dL (ref 30.0–36.0)
MCV: 93.9 fL (ref 80.0–100.0)
Monocytes Absolute: 0.9 10*3/uL (ref 0.1–1.0)
Monocytes Relative: 18 %
Neutro Abs: 1.8 10*3/uL (ref 1.7–7.7)
Neutrophils Relative %: 36 %
Platelets: 168 10*3/uL (ref 150–400)
RBC: 3.6 MIL/uL — ABNORMAL LOW (ref 3.87–5.11)
RDW: 13.1 % (ref 11.5–15.5)
WBC: 4.9 10*3/uL (ref 4.0–10.5)
nRBC: 0 % (ref 0.0–0.2)

## 2019-07-13 MED ORDER — SODIUM CHLORIDE 0.9 % IV SOLN
Freq: Once | INTRAVENOUS | Status: AC
  Filled 2019-07-13: qty 1000

## 2019-07-13 MED ORDER — TRAMADOL HCL 50 MG PO TABS: 50.0000 mg | ORAL_TABLET | Freq: Four times a day (QID) | ORAL | 0 refills | Status: DC | PRN

## 2019-07-13 MED ORDER — HEPARIN SOD (PORK) LOCK FLUSH 100 UNIT/ML IV SOLN
500.0000 [IU] | Freq: Once | INTRAVENOUS | Status: AC
  Administered 2019-07-13: 12:00:00 500 [IU] via INTRAVENOUS

## 2019-07-13 MED ORDER — MAGNESIUM OXIDE 400 (241.3 MG) MG PO TABS: 400.0000 mg | ORAL_TABLET | Freq: Three times a day (TID) | ORAL | 2 refills | Status: DC

## 2019-07-13 MED ORDER — SODIUM CHLORIDE 0.9% FLUSH
10.0000 mL | INTRAVENOUS | Status: DC | PRN
  Administered 2019-07-13 (×2): 10 mL via INTRAVENOUS

## 2019-07-13 NOTE — Progress Notes (Signed)
[place], [place] [address]   CLINIC:  Medical Oncology/Hematology  PCP:  [name], MD [place] [address] [phone]   REASON FOR VISIT:  Follow-up for endometrioid adenocarcinoma.  CURRENT THERAPY: Carboplatin and paclitaxel.  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  Insufficient material for HER2 testing.   12/28/2013 Breast MRI   Post biopsy changes located within the left breast laterally (upper-outer quadrant) related to the patient's recent stereotactic biopsy. Also, postsurgical scarring changes within the left breast. No areas of worrisome enhancement within the right breast.   01/26/2014 Definitive Surgery   Left modified radical mastectomy by Dr. Arnoldo Morale   02/01/2014 Pathology Results   Intermediate grade DCIS, 0.5 cm, 0/6 lymph nodes, negative resection margins.   03/13/2016 Imaging   Bone density- BMD as determined from Femur Neck Right is 1.023 g/cm2 with a T-Score of -0.1. This patient is considered normal according to [facility] criteria.   Endometrial cancer ([place])  04/22/2019 Initial Biopsy   EMB - Gr1 EMCA   04/28/2019 Initial Diagnosis   Endometrial cancer determined by uterine biopsy ([place])   05/18/2019 Surgery   TRH/BSO, bilateral SLNs SLNs bulky.    Pathologic Stage   IIIC1, +LVSI, outer half MI, endometrioid adenoca, G 1-2 MSI-H   07/05/2019 -  Chemotherapy   The patient had palonosetron (ALOXI) injection 0.25 mg, 0.25 mg, Intravenous,  Once, 1 of 6 cycles Administration: 0.25 mg (07/05/2019) pegfilgrastim-jmdb (FULPHILA) injection 6 mg, 6 mg, Subcutaneous,  Once, 1 of 6 cycles Administration: 6 mg (07/07/2019) CARBOplatin (PARAPLATIN) 750 mg in sodium chloride 0.9 % 250 mL chemo  infusion, 750 mg (100 % of original dose 745.2 mg), Intravenous,  Once, 1 of 6 cycles Dose modification:   (original dose 745.2 mg, Cycle 1) Administration: 750 mg (07/05/2019) fosaprepitant (EMEND) 150 mg in sodium chloride 0.9 % 145 mL IVPB, 150 mg, Intravenous,  Once, 1 of 6 cycles Administration: 150 mg (07/05/2019) PACLitaxel (TAXOL) 414 mg in sodium chloride 0.9 % 500 mL chemo infusion (> 80m/m2), 175 mg/m2 = 414 mg, Intravenous,  Once, 1 of 6 cycles Administration: 414 mg (07/05/2019)  for chemotherapy treatment.       CANCER STAGING: Cancer Staging Breast cancer ([facility] Staging form: Breast, AJCC 7th Edition - Clinical stage from 02/01/2014: Stage 0 (Tis (DCIS), N0, M0) - Signed by KBaird Cancer PA-C on 12/01/2015  Endometrial cancer ([facility] Staging form: Corpus Uteri - Carcinoma and Carcinosarcoma, AJCC 8th Edition - Clinical stage from 05/18/2019: FIGO Stage IIIC1 (cT1b, cN1a(sn)) - Unsigned    INTERVAL HISTORY:  Ms. [id number]. female seen for follow-up after cycle 1 of chemotherapy.  She experienced severe body pains lasting 4 to 5 days.  She had to take tramadol twice daily to get relief.  She also took Claritin.  She denied any diarrhea or constipation.  Denied any nausea or vomiting.  However she felt very weak for 2 to 3 days after chemotherapy with miserable feeling.  She also felt wobbly at times.  Denied any tingling or numbness next 2 days.    REVIEW OF SYSTEMS:  Review of Systems  Musculoskeletal: Positive for myalgias.  Neurological: Positive for dizziness.  Psychiatric/Behavioral: Positive for depression. The patient is nervous/anxious.   All other  systems reviewed and are negative.    PAST MEDICAL/SURGICAL HISTORY:  Past Medical History:  Diagnosis Date  . Ankle pain   . Anxiety   . Arthritis   . Asthma    Seasonal asthma  . Breast cancer ([place]) 2008   ILC of Left breast; ER+  . Breast cancer ([place]) 2015   IDC+DCIS of Left breast; ER/PR+, Her2-, Ki67  = 4%  . Complication of anesthesia    Bad gag reflex  . Depression   . Dysrhythmia    A-fib  . Endometrial cancer ([place])   . GERD (gastroesophageal reflux disease)   . H/O hiatal hernia 2003  . Hepatitis    history in early 20's.  Marland Kitchen History of atrial fibrillation 2011  . History of blood transfusion   . History of hepatitis A    Age 1  . History of pneumonia   . Hyperactive gag reflex   . Hyperlipidemia   . Hypertension   . Knee pain   . Obesity, Class III, BMI 40-49.9 (morbid obesity) ([place])   . Pneumonia   . Port-A-Cath in place 06/28/2019  . Sciatica   . Sleep apnea    cpap  . Vertigo    Past Surgical History:  Procedure Laterality Date  . BREAST LUMPECTOMY Left 1982,2008   x 2   . CHOLECYSTECTOMY N/A 12/31/2016   Procedure: LAPAROSCOPIC CHOLECYSTECTOMY WITH POSSIBLE INTRAOPERATIVE CHOLANGIOGRAM;  Surgeon: Rolm Bookbinder, MD;  Location: [facility];  Service: General;  Laterality: N/A;  . COLONOSCOPY    . EYE SURGERY     bil cataract surgery  . HIATAL HERNIA REPAIR  2003  . Lymph node removal  2008  . MASTECTOMY MODIFIED RADICAL Left 01/26/2014   Procedure: LEFT MODIFIED RADICAL MASTECTOMY;  Surgeon: Jamesetta So, MD;  Location: [facility];  Service: General;  Laterality: Left;  . PORT-A-CATH REMOVAL  2009 ish  . PORTA CATH INSERTION  [id number]  . PORTACATH PLACEMENT Right 06/23/2019   Procedure: INSERTION PORT-A-CATH;  Surgeon: Aviva Signs, MD;  Location: [facility];  Service: General;  Laterality: Right;  . ROBOTIC ASSISTED TOTAL HYSTERECTOMY WITH BILATERAL SALPINGO OOPHERECTOMY N/A 05/18/2019   Procedure: XI ROBOTIC ASSISTED TOTAL HYSTERECTOMY WITH BILATERAL SALPINGO OOPHORECTOMY;  Surgeon: Lafonda Mosses, MD;  Location: [facility];  Service: Gynecology;  Laterality: N/A;  . SENTINEL NODE BIOPSY N/A 05/18/2019   Procedure: SENTINEL LYMPH NODE BIOPSY;  Surgeon: Lafonda Mosses, MD;  Location: [facility];  Service: Gynecology;  Laterality: N/A;  . TOTAL KNEE ARTHROPLASTY  Right 2006  . TOTAL KNEE ARTHROPLASTY  08/12/2011   Procedure: TOTAL KNEE ARTHROPLASTY;  Surgeon: Arther Abbott, MD;  Location: [facility];  Service: Orthopedics;  Laterality: Left;  . TUBAL LIGATION  1980     SOCIAL HISTORY:  Social History   Socioeconomic History  . Marital status: Widowed    Spouse name: Not on file  . Number of children: 2  . Years of education: 57  . Highest education level: Not on file  Occupational History  . Occupation: retired  Tobacco Use  . Smoking status: Former Smoker    Packs/day: 3.00    Years: 25.00    Pack years: 75.00    Types: Cigarettes    Quit date: 06/30/1997    Years since quitting: 22.0  . Smokeless tobacco: Never Used  Substance and Sexual Activity  . Alcohol use: Yes    Alcohol/week: 0.0 standard drinks    Comment: 2 glasses wine per month  . Drug  use: No  . Sexual activity: Not Currently    Birth control/protection: Surgical    Comment: tubal  Other Topics Concern  . Not on file  Social History Narrative   Drinks 2-3 cups of coffee a day    Social Determinants of Health   Financial Resource Strain:   . Difficulty of Paying Living Expenses: Not on file  Food Insecurity:   . Worried About Charity fundraiser in the Last Year: Not on file  . Ran Out of Food in the Last Year: Not on file  Transportation Needs:   . Lack of Transportation (Medical): Not on file  . Lack of Transportation (Non-Medical): Not on file  Physical Activity:   . Days of Exercise per Week: Not on file  . Minutes of Exercise per Session: Not on file  Stress:   . Feeling of Stress : Not on file  Social Connections:   . Frequency of Communication with Friends and Family: Not on file  . Frequency of Social Gatherings with Friends and Family: Not on file  . Attends Religious Services: Not on file  . Active Member of Clubs or Organizations: Not on file  . Attends Archivist Meetings: Not on file  . Marital Status: Not on file  Intimate  Partner Violence:   . Fear of Current or Ex-Partner: Not on file  . Emotionally Abused: Not on file  . Physically Abused: Not on file  . Sexually Abused: Not on file    FAMILY HISTORY:  Family History  Problem Relation Age of Onset  . Stroke Father   . Heart disease Father   . Skin cancer Sister        dx. 66-58; unknown type  . Throat cancer Maternal Uncle 75       smoker; voicebox removed  . Alzheimer's disease Paternal Grandmother   . Heart attack Paternal Grandfather   . Alzheimer's disease Maternal Aunt   . Heart disease Maternal Uncle   . Lung cancer Cousin        smoker  . Lung cancer Paternal Aunt        dx. mid-70s; worked at [employer]  . Heart attack Daughter        caused by thyroid issues  . Heart disease Other   . Arthritis Other   . Lung disease Other   . Cancer Other   . Kidney disease Other   . Colon cancer Other   . Ovarian cancer Other   . Endometrial cancer Other     CURRENT MEDICATIONS:  Outpatient Encounter Medications as of 07/13/2019  Medication Sig Note  . albuterol (PROVENTIL) (2.5 MG/3ML) 0.083% nebulizer solution Take 3 mLs by nebulization 4 (four) times daily as needed for wheezing or shortness of breath.    Marland Kitchen albuterol (VENTOLIN HFA) 108 (90 Base) MCG/ACT inhaler Inhale 1-2 puffs into the lungs every 6 (six) hours as needed for wheezing or shortness of breath.   Marland Kitchen apixaban (ELIQUIS) 5 MG TABS tablet Take 1 tablet (5 mg total) by mouth 2 (two) times daily. 06/21/2019: Last dose was 06/20/2019. On hold for surgery.  Marland Kitchen buPROPion (WELLBUTRIN XL) 300 MG 24 hr tablet Take 300 mg by mouth daily.   . Calcium Carbonate-Vit D-Min (CALCIUM 1200 PO) Take 1 tablet by mouth daily.    Marland Kitchen CARBOPLATIN IV Inject into the vein every 21 ( twenty-one) days.   . fexofenadine (ALLEGRA) 180 MG tablet Take 180 mg by mouth daily.   Marland Kitchen  furosemide (LASIX) 40 MG tablet Take 1 tablet (40 mg total) by mouth 2 (two) times daily.   Marland Kitchen ibuprofen (ADVIL) 600 MG tablet Take  1 tablet (600 mg total) by mouth every 6 (six) hours as needed for moderate pain. For AFTER surgery   . lidocaine-prilocaine (EMLA) cream Apply a small amount to port a cath site and cover with plastic wrap 1 hour prior to chemotherapy appointments   . magnesium oxide (MAG-OX) 400 (241.3 Mg) MG tablet Take 1 tablet (400 mg total) by mouth 3 (three) times daily.   . metoprolol tartrate (LOPRESSOR) 25 MG tablet Take 1 tablet (25 mg total) by mouth 2 (two) times daily.   Marland Kitchen omeprazole (PRILOSEC) 20 MG capsule Take 20 mg by mouth 2 (two) times daily.    Marland Kitchen PACLITAXEL IV Inject into the vein every 21 ( twenty-one) days.   . potassium chloride SA (KLOR-CON M20) 20 MEQ tablet Take 2 tablets (40 mEq total) by mouth every morning. & 20 meq in the evening (Patient taking differently: Take 20 mEq by mouth 2 (two) times daily. )   . prochlorperazine (COMPAZINE) 10 MG tablet Take 1 tablet (10 mg total) by mouth every 6 (six) hours as needed (Nausea or vomiting).   . rosuvastatin (CRESTOR) 20 MG tablet Take 20 mg by mouth at bedtime.    . sertraline (ZOLOFT) 100 MG tablet Take 100 mg by mouth at bedtime.   . [DISCONTINUED] traMADol (ULTRAM) 50 MG tablet Take 1 tablet (50 mg total) by mouth every 6 (six) hours as needed for severe pain.    No facility-administered encounter medications on file as of 07/13/2019.    ALLERGIES:  No Known Allergies   PHYSICAL EXAM:  ECOG Performance status: 1  Vitals:   07/13/19 0756  BP: 131/73  Pulse: 84  Resp: 18  Temp: (!) 97.4 F (36.3 C)  SpO2: 97%   Filed Weights   07/13/19 0756  Weight: 257 lb 6.4 oz (116.8 kg)    Physical Exam Vitals reviewed.  Constitutional:      Appearance: Normal appearance.  Cardiovascular:     Rate and Rhythm: Normal rate and regular rhythm.     Heart sounds: Normal heart sounds.  Pulmonary:     Effort: Pulmonary effort is normal.     Breath sounds: Normal breath sounds.  Abdominal:     General: There is no distension.      Palpations: Abdomen is soft. There is no mass.  Musculoskeletal:        General: No swelling.  Skin:    General: Skin is warm.  Neurological:     General: No focal deficit present.     Mental Status: She is alert and oriented to person, place, and time.  Psychiatric:        Mood and Affect: Mood normal.        Behavior: Behavior normal.      LABORATORY DATA:  I have reviewed the labs as listed.  CBC    Component Value Date/Time   WBC 4.9 07/13/2019 0810   RBC 3.60 (L) 07/13/2019 0810   HGB 11.1 (L) 07/13/2019 0810   HCT 33.8 (L) 07/13/2019 0810   PLT 168 07/13/2019 0810   MCV 93.9 07/13/2019 0810   MCH 30.8 07/13/2019 0810   MCHC 32.8 07/13/2019 0810   RDW 13.1 07/13/2019 0810   LYMPHSABS 1.9 07/13/2019 0810   MONOABS 0.9 07/13/2019 0810   EOSABS 0.2 07/13/2019 0810   BASOSABS 0.1 07/13/2019 0810  CMP Latest Ref Rng & Units 07/13/2019 07/05/2019 06/21/2019  Glucose 70 - 99 mg/dL 160(H) 137(H) 104(H)  BUN 8 - 23 mg/dL 13 13 11   Creatinine 0.44 - 1.00 mg/dL 0.73 0.72 0.90  Sodium 135 - 145 mmol/L 138 139 140  Potassium 3.5 - 5.1 mmol/L 3.2(L) 3.7 3.9  Chloride 98 - 111 mmol/L 100 101 104  CO2 22 - 32 mmol/L 28 27 26   Calcium 8.9 - 10.3 mg/dL 8.2(L) 9.1 9.0  Total Protein 6.5 - 8.1 g/dL 6.6 7.1 6.9  Total Bilirubin 0.3 - 1.2 mg/dL 0.6 0.8 0.7  Alkaline Phos 38 - 126 U/L 73 70 73  AST 15 - 41 U/L 22 19 21   ALT 0 - 44 U/L 23 22 18        DIAGNOSTIC IMAGING:  I have independently reviewed the scans and discussed with the patient.     ASSESSMENT & PLAN:   Endometrial cancer ([place]) 1.  Stage III C1 (PT1BPN1A) endometrioid adenocarcinoma: -Debulking surgery on 05/18/2019. -CT CAP on 06/02/2019 did not show any evidence of metastatic disease. -Cycle 1 of carboplatin and paclitaxel on 07/05/2019. -She felt very weak for 2 to 3 days with miserable feeling. -She also felt wobbly when she stands.  Denies any tingling or numbness in the extremities. -She had allergic  reaction to Taxol, 30 minutes after initiation with difficulty breathing and abdominal cramping.  We will have to intensify her premeds for next cycle. -We reviewed her labs today.  She will receive 1 L of fluids.  2.  Severe body pains: -She has experienced body pains lasting about 4 to 5 days after the full filler injection.  They are predominantly in the calves and legs. -I have sent a prescription for tramadol 50 mg twice daily as needed.  3.  Hypomagnesemia: -Her magnesium is severely low at 0.8. -She will receive 2 g of IV magnesium today.  We will start her on magnesium 3 times a day at home.  4.  Hypokalemia: -She is taking potassium 20 mEq 1 tablet twice daily at home. -Today potassium is 3.2.  She will receive 20 mEq potassium today.  5.  Hypertension: -She is on metoprolol 25 mg twice daily.  Blood pressure today is 131/73. -She reported feeling wobbly at home.  I have recommended her to check her blood pressures at home and showed to [place].     Orders placed this encounter:  Orders Placed This Encounter  Procedures  . CBC with Differential/Platelet  . Comprehensive metabolic panel  . Magnesium      Derek Jack, MD [place] [phone]

## 2019-07-13 NOTE — Patient Instructions (Addendum)
[place] at [facility] Discharge Instructions  You were seen today by Dr. Delton Coombes. He went over your recent lab results. He will send in a new prescription for magnesium, please take this 3 times a day. He will see you back as scheduled for labs, treatment and follow up.   Thank you for choosing [place] at [facility] to provide your oncology and hematology care.  To afford each patient quality time with our provider, please arrive at least 15 minutes before your scheduled appointment time.   If you have a lab appointment with the [facility] please come in thru the  Main Entrance and check in at the main information desk  You need to re-schedule your appointment should you arrive 10 or more minutes late.  We strive to give you quality time with our providers, and arriving late affects you and other patients whose appointments are after yours.  Also, if you no show three or more times for appointments you may be dismissed from the clinic at the providers discretion.     Again, thank you for choosing [facility].  Our hope is that these requests will decrease the amount of time that you wait before being seen by our physicians.       _____________________________________________________________  Should you have questions after your visit to [facility], please contact our office at [phone] between the hours of 8:00 a.m. and 4:30 p.m.  Voicemails left after 4:00 p.m. will not be returned until the following business day.  For prescription refill requests, have your pharmacy contact our office and allow 72 hours.    Cancer Center Support Programs:   > Cancer Support Group  2nd Tuesday of the month 1pm-2pm, Journey Room

## 2019-07-13 NOTE — Progress Notes (Signed)
CRITICAL VALUE ALERT  Critical Value:  Mg++ 0.8  Date & Time Notied:  07/13/2019 at 0830  Provider Notified: Dr. Delton Coombes  Orders Received/Actions taken: give NS 1L w/ Mg++ 2g and KCl 20 mEq over 2 hours.

## 2019-07-13 NOTE — Progress Notes (Signed)
0900 Labs reviewed with and pt seen by Dr. Delton Coombes and pt to receive 1 liter of IV hydration with magnesium and potassium today per MD                                                                                    Stephanie Jarvis tolerated IV hydration well without complaints or incident. VSS upon discharge. Pt discharged via wheelchair in satisfactory condition

## 2019-07-13 NOTE — Assessment & Plan Note (Addendum)
1.  Stage III C1 (PT1BPN1A) endometrioid adenocarcinoma: -Debulking surgery on 05/18/2019. -CT CAP on 06/02/2019 did not show any evidence of metastatic disease. -Cycle 1 of carboplatin and paclitaxel on 07/05/2019. -She felt very weak for 2 to 3 days with miserable feeling. -She also felt wobbly when she stands.  Denies any tingling or numbness in the extremities. -She had allergic reaction to Taxol, 30 minutes after initiation with difficulty breathing and abdominal cramping.  We will have to intensify her premeds for next cycle. -We reviewed her labs today.  She will receive 1 L of fluids.  2.  Severe body pains: -She has experienced body pains lasting about 4 to 5 days after the full filler injection.  They are predominantly in the calves and legs. -I have sent a prescription for tramadol 50 mg twice daily as needed.  3.  Hypomagnesemia: -Her magnesium is severely low at 0.8. -She will receive 2 g of IV magnesium today.  We will start her on magnesium 3 times a day at home.  4.  Hypokalemia: -She is taking potassium 20 mEq 1 tablet twice daily at home. -Today potassium is 3.2.  She will receive 20 mEq potassium today.  5.  Hypertension: -She is on metoprolol 25 mg twice daily.  Blood pressure today is 131/73. -She reported feeling wobbly at home.  I have recommended her to check her blood pressures at home and showed to [place].

## 2019-07-13 NOTE — Patient Instructions (Signed)
[facility] at [facility] Discharge Instructions  Received IV hydration with magnesium and potassium today. Follow-up as scheduled. Call clinic for any questions or concerns   Thank you for choosing [facility] at [facility] to provide your oncology and hematology care.  To afford each patient quality time with our provider, please arrive at least 15 minutes before your scheduled appointment time.   If you have a lab appointment with the Cancer Center please come in thru the Main Entrance and check in at the main information desk.  You need to re-schedule your appointment should you arrive 10 or more minutes late.  We strive to give you quality time with our providers, and arriving late affects you and other patients whose appointments are after yours.  Also, if you no show three or more times for appointments you may be dismissed from the clinic at the providers discretion.     Again, thank you for choosing [facility].  Our hope is that these requests will decrease the amount of time that you wait before being seen by our physicians.       _____________________________________________________________  Should you have questions after your visit to [facility], please contact our office at [phone] between the hours of 8:00 a.m. and 4:30 p.m.  Voicemails left after 4:00 p.m. will not be returned until the following business day.  For prescription refill requests, have your pharmacy contact our office and allow 72 hours.    Due to Covid, you will need to wear a mask upon entering the hospital. If you do not have a mask, a mask will be given to you at the Main Entrance upon arrival. For doctor visits, patients may have 1 support person with them. For treatment visits, patients can not have anyone with them due to social distancing guidelines and our immunocompromised population.     

## 2019-07-15 ENCOUNTER — Encounter (HOSPITAL_COMMUNITY): Payer: Medicare Other | Admitting: Genetic Counselor

## 2019-07-15 ENCOUNTER — Other Ambulatory Visit (HOSPITAL_COMMUNITY): Payer: Medicare Other

## 2019-07-25 DIAGNOSIS — E785 Hyperlipidemia, unspecified: Secondary | ICD-10-CM | POA: Diagnosis not present

## 2019-07-25 DIAGNOSIS — K219 Gastro-esophageal reflux disease without esophagitis: Secondary | ICD-10-CM | POA: Diagnosis not present

## 2019-07-26 ENCOUNTER — Inpatient Hospital Stay (HOSPITAL_BASED_OUTPATIENT_CLINIC_OR_DEPARTMENT_OTHER): Payer: Medicare Other | Admitting: Hematology

## 2019-07-26 ENCOUNTER — Encounter (HOSPITAL_COMMUNITY): Payer: Self-pay | Admitting: Hematology

## 2019-07-26 ENCOUNTER — Inpatient Hospital Stay (HOSPITAL_COMMUNITY): Payer: Medicare Other

## 2019-07-26 ENCOUNTER — Other Ambulatory Visit: Payer: Self-pay

## 2019-07-26 ENCOUNTER — Encounter (HOSPITAL_COMMUNITY): Payer: Self-pay

## 2019-07-26 VITALS — BP 120/59 | HR 88 | Temp 96.8°F | Resp 18

## 2019-07-26 DIAGNOSIS — C541 Malignant neoplasm of endometrium: Secondary | ICD-10-CM

## 2019-07-26 DIAGNOSIS — R531 Weakness: Secondary | ICD-10-CM | POA: Diagnosis not present

## 2019-07-26 DIAGNOSIS — Z5111 Encounter for antineoplastic chemotherapy: Secondary | ICD-10-CM | POA: Diagnosis not present

## 2019-07-26 DIAGNOSIS — M791 Myalgia, unspecified site: Secondary | ICD-10-CM | POA: Diagnosis not present

## 2019-07-26 DIAGNOSIS — R42 Dizziness and giddiness: Secondary | ICD-10-CM | POA: Diagnosis not present

## 2019-07-26 DIAGNOSIS — Z95828 Presence of other vascular implants and grafts: Secondary | ICD-10-CM

## 2019-07-26 DIAGNOSIS — Z5189 Encounter for other specified aftercare: Secondary | ICD-10-CM | POA: Diagnosis not present

## 2019-07-26 LAB — CBC WITH DIFFERENTIAL/PLATELET
Abs Immature Granulocytes: 0.02 10*3/uL (ref 0.00–0.07)
Basophils Absolute: 0.1 10*3/uL (ref 0.0–0.1)
Basophils Relative: 1 %
Eosinophils Absolute: 0 10*3/uL (ref 0.0–0.5)
Eosinophils Relative: 1 %
HCT: 33 % — ABNORMAL LOW (ref 36.0–46.0)
Hemoglobin: 10.7 g/dL — ABNORMAL LOW (ref 12.0–15.0)
Immature Granulocytes: 0 %
Lymphocytes Relative: 24 %
Lymphs Abs: 1.8 10*3/uL (ref 0.7–4.0)
MCH: 30.3 pg (ref 26.0–34.0)
MCHC: 32.4 g/dL (ref 30.0–36.0)
MCV: 93.5 fL (ref 80.0–100.0)
Monocytes Absolute: 0.8 10*3/uL (ref 0.1–1.0)
Monocytes Relative: 11 %
Neutro Abs: 4.6 10*3/uL (ref 1.7–7.7)
Neutrophils Relative %: 63 %
Platelets: 473 10*3/uL — ABNORMAL HIGH (ref 150–400)
RBC: 3.53 MIL/uL — ABNORMAL LOW (ref 3.87–5.11)
RDW: 14.5 % (ref 11.5–15.5)
WBC: 7.3 10*3/uL (ref 4.0–10.5)
nRBC: 0 % (ref 0.0–0.2)

## 2019-07-26 LAB — COMPREHENSIVE METABOLIC PANEL
ALT: 22 U/L (ref 0–44)
AST: 21 U/L (ref 15–41)
Albumin: 3.5 g/dL (ref 3.5–5.0)
Alkaline Phosphatase: 77 U/L (ref 38–126)
Anion gap: 11 (ref 5–15)
BUN: 11 mg/dL (ref 8–23)
CO2: 28 mmol/L (ref 22–32)
Calcium: 9 mg/dL (ref 8.9–10.3)
Chloride: 101 mmol/L (ref 98–111)
Creatinine, Ser: 0.75 mg/dL (ref 0.44–1.00)
GFR calc Af Amer: >60 mL/min (ref >60)
GFR calc non Af Amer: >60 mL/min (ref >60)
Glucose, Bld: 138 mg/dL — ABNORMAL HIGH (ref 70–99)
Potassium: 3.9 mmol/L (ref 3.5–5.1)
Sodium: 140 mmol/L (ref 135–145)
Total Bilirubin: 0.4 mg/dL (ref 0.3–1.2)
Total Protein: 6.8 g/dL (ref 6.5–8.1)

## 2019-07-26 LAB — MAGNESIUM: Magnesium: 1.5 mg/dL — ABNORMAL LOW (ref 1.7–2.4)

## 2019-07-26 MED ORDER — HEPARIN SOD (PORK) LOCK FLUSH 100 UNIT/ML IV SOLN
500.0000 [IU] | Freq: Once | INTRAVENOUS | Status: AC | PRN
  Administered 2019-07-26: 500 [IU]

## 2019-07-26 MED ORDER — FAMOTIDINE IN NACL 20-0.9 MG/50ML-% IV SOLN
INTRAVENOUS | Status: AC
  Filled 2019-07-26: qty 50

## 2019-07-26 MED ORDER — FAMOTIDINE IN NACL 20-0.9 MG/50ML-% IV SOLN
20.0000 mg | Freq: Once | INTRAVENOUS | Status: AC
  Administered 2019-07-26: 12:00:00 20 mg via INTRAVENOUS

## 2019-07-26 MED ORDER — PALONOSETRON HCL INJECTION 0.25 MG/5ML
0.2500 mg | Freq: Once | INTRAVENOUS | Status: AC
  Administered 2019-07-26: 0.25 mg via INTRAVENOUS

## 2019-07-26 MED ORDER — SODIUM CHLORIDE 0.9 % IV SOLN: Freq: Once | INTRAVENOUS | Status: AC

## 2019-07-26 MED ORDER — PALONOSETRON HCL INJECTION 0.25 MG/5ML
INTRAVENOUS | Status: AC
  Filled 2019-07-26: qty 5

## 2019-07-26 MED ORDER — SODIUM CHLORIDE 0.9 % IV SOLN
10.0000 mg | Freq: Once | INTRAVENOUS | Status: AC
  Administered 2019-07-26: 10 mg via INTRAVENOUS
  Filled 2019-07-26: qty 10

## 2019-07-26 MED ORDER — SODIUM CHLORIDE 0.9 % IV SOLN
150.0000 mg | Freq: Once | INTRAVENOUS | Status: AC
  Administered 2019-07-26: 11:00:00 150 mg via INTRAVENOUS
  Filled 2019-07-26: qty 150

## 2019-07-26 MED ORDER — SODIUM CHLORIDE 0.9 % IV SOLN
745.2000 mg | Freq: Once | INTRAVENOUS | Status: AC
  Administered 2019-07-26: 16:00:00 750 mg via INTRAVENOUS
  Filled 2019-07-26: qty 75

## 2019-07-26 MED ORDER — METHYLPREDNISOLONE SODIUM SUCC 125 MG IJ SOLR
125.0000 mg | Freq: Once | INTRAMUSCULAR | Status: AC
  Administered 2019-07-26: 12:00:00 125 mg via INTRAVENOUS

## 2019-07-26 MED ORDER — SODIUM CHLORIDE 0.9 % IV SOLN
175.0000 mg/m2 | Freq: Once | INTRAVENOUS | Status: AC
  Administered 2019-07-26: 414 mg via INTRAVENOUS
  Filled 2019-07-26: qty 69

## 2019-07-26 MED ORDER — DIPHENHYDRAMINE HCL 50 MG/ML IJ SOLN
50.0000 mg | Freq: Once | INTRAMUSCULAR | Status: AC
  Administered 2019-07-26: 50 mg via INTRAVENOUS

## 2019-07-26 MED ORDER — DIPHENHYDRAMINE HCL 50 MG/ML IJ SOLN
INTRAMUSCULAR | Status: AC
  Filled 2019-07-26: qty 1

## 2019-07-26 MED ORDER — SODIUM CHLORIDE 0.9% FLUSH
10.0000 mL | INTRAVENOUS | Status: DC | PRN
  Administered 2019-07-26: 11:00:00 10 mL

## 2019-07-26 MED ORDER — MAGNESIUM SULFATE 2 GM/50ML IV SOLN
2.0000 g | Freq: Once | INTRAVENOUS | Status: AC
  Administered 2019-07-26: 2 g via INTRAVENOUS
  Filled 2019-07-26: qty 50

## 2019-07-26 MED ORDER — METHYLPREDNISOLONE SODIUM SUCC 125 MG IJ SOLR
INTRAMUSCULAR | Status: AC
  Filled 2019-07-26: qty 2

## 2019-07-26 NOTE — Progress Notes (Signed)
Labs reviewed by MD today. Proceed with treatment today per MD. Will give additional magnesium per orders.

## 2019-07-26 NOTE — Assessment & Plan Note (Addendum)
1.  Stage III C1 (PT1BPN1A) endometrioid adenocarcinoma: -Debulking surgery on 05/18/2019. -CT CAP on 06/02/2019 did not show any evidence of metastatic disease. -Cycle 1 of carboplatin and paclitaxel on 07/05/2019. -She denies any tingling or numbness in extremities.  She had allergic reaction to Taxol 30 minutes after initiation with difficulty breathing and abdominal cramping during cycle 1. -I have reviewed her labs.  We will add extra Solu-Medrol 125 mg IV today in the premeds.  She will also get Pepcid along with Benadryl. -We will see her back in 3 weeks for follow-up.  2.  Severe body pains: -She got severe body pains after her chemotherapy for 4 to 5 days. -I have given prescription for tramadol 50 mg to be taken as needed.  3.  Hypomagnesemia: -She is taking magnesium 3 times a day at home. -Magnesium level today is 1.5.  She will receive 2 g of IV magnesium.  4.  Hypokalemia: -She takes potassium 20 mEq twice daily. -Today potassium is normal.  5.  Hypertension: -She will continue metoprolol 25 mg twice her blood pressure today is normal.  Denies any dizziness.

## 2019-07-26 NOTE — Progress Notes (Signed)
[place], [place] [address]   CLINIC:  Medical Oncology/Hematology  PCP:  [name], MD [place] [address] [phone]   REASON FOR VISIT:  Follow-up for endometrioid adenocarcinoma.  CURRENT THERAPY: Carboplatin and paclitaxel.  BRIEF ONCOLOGIC HISTORY:  Oncology History  Breast cancer ([place])  12/08/2013 Procedure   Left needle core biopsy   12/09/2013 Pathology Results   Invasive ductal carcinoma with DCIS, ER 100%, PR 31%, Ki-67 marker 4%.  Insufficient material for HER2 testing.   12/28/2013 Breast MRI   Post biopsy changes located within the left breast laterally (upper-outer quadrant) related to the patient's recent stereotactic biopsy. Also, postsurgical scarring changes within the left breast. No areas of worrisome enhancement within the right breast.   01/26/2014 Definitive Surgery   Left modified radical mastectomy by Dr. Arnoldo Morale   02/01/2014 Pathology Results   Intermediate grade DCIS, 0.5 cm, 0/6 lymph nodes, negative resection margins.   03/13/2016 Imaging   Bone density- BMD as determined from Femur Neck Right is 1.023 g/cm2 with a T-Score of -0.1. This patient is considered normal according to [facility]) criteria.   Endometrial cancer ([place])  04/22/2019 Initial Biopsy   EMB - Gr1 EMCA   04/28/2019 Initial Diagnosis   Endometrial cancer determined by uterine biopsy ([place])   05/18/2019 Surgery   TRH/BSO, bilateral SLNs SLNs bulky.    Pathologic Stage   IIIC1, +LVSI, outer half MI, endometrioid adenoca, G 1-2 MSI-H   07/05/2019 -  Chemotherapy   The patient had palonosetron (ALOXI) injection 0.25 mg, 0.25 mg, Intravenous,  Once, 2 of 6 cycles Administration: 0.25 mg (07/05/2019), 0.25 mg (07/26/2019) pegfilgrastim-jmdb (FULPHILA) injection 6 mg, 6 mg, Subcutaneous,  Once, 2 of 6 cycles Administration: 6 mg (07/07/2019) CARBOplatin (PARAPLATIN) 750 mg in sodium chloride  0.9 % 250 mL chemo infusion, 750 mg (100 % of original dose 745.2 mg), Intravenous,  Once, 2 of 6 cycles Dose modification:   (original dose 745.2 mg, Cycle 1),   (original dose 745.2 mg, Cycle 2) Administration: 750 mg (07/05/2019), 750 mg (07/26/2019) fosaprepitant (EMEND) 150 mg in sodium chloride 0.9 % 145 mL IVPB, 150 mg, Intravenous,  Once, 2 of 6 cycles Administration: 150 mg (07/05/2019), 150 mg (07/26/2019) PACLitaxel (TAXOL) 414 mg in sodium chloride 0.9 % 500 mL chemo infusion (> 97m/m2), 175 mg/m2 = 414 mg, Intravenous,  Once, 2 of 6 cycles Administration: 414 mg (07/05/2019), 414 mg (07/26/2019)  for chemotherapy treatment.       CANCER STAGING: Cancer Staging Breast cancer ([facility] Staging form: Breast, AJCC 7th Edition - Clinical stage from 02/01/2014: Stage 0 (Tis (DCIS), N0, M0) - Signed by KBaird Cancer PA-C on 12/01/2015  Endometrial cancer ([facility] Staging form: Corpus Uteri - Carcinoma and Carcinosarcoma, AJCC 8th Edition - Clinical stage from 05/18/2019: FIGO Stage IIIC1 (cT1b, cN1a(sn)) - Unsigned    INTERVAL HISTORY:  [name] female seen for follow-up after cycle 1 of chemotherapy and prior to cycle 2 today.  She did experience body pains after cycle 1 lasted 4 to 5 days.  She denies any tingling or numbness symptoms.  She is taking magnesium 3 times a day.  She also takes potassium twice daily.  Appetite is 100%.  Energy levels are low.  She has shortness of breath from her asthma.   REVIEW OF SYSTEMS:  Review of Systems  Respiratory: Positive for shortness of breath.   All other systems reviewed and  are negative.    PAST MEDICAL/SURGICAL HISTORY:  Past Medical History:  Diagnosis Date  . Ankle pain   . Anxiety   . Arthritis   . Asthma    Seasonal asthma  . Breast cancer ([place]) 2008   ILC of Left breast; ER+  . Breast cancer ([place]) 2015   IDC+DCIS of Left breast; ER/PR+, Her2-, Ki67 = 4%  . Complication of anesthesia    Bad gag reflex  .  Depression   . Dysrhythmia    A-fib  . Endometrial cancer ([place])   . GERD (gastroesophageal reflux disease)   . H/O hiatal hernia 2003  . Hepatitis    history in early 20's.  Marland Kitchen History of atrial fibrillation 2011  . History of blood transfusion   . History of hepatitis A    Age 69  . History of pneumonia   . Hyperactive gag reflex   . Hyperlipidemia   . Hypertension   . Knee pain   . Obesity, Class III, BMI 40-49.9 (morbid obesity) ([place])   . Pneumonia   . Port-A-Cath in place 06/28/2019  . Sciatica   . Sleep apnea    cpap  . Vertigo    Past Surgical History:  Procedure Laterality Date  . BREAST LUMPECTOMY Left 1982,2008   x 2   . CHOLECYSTECTOMY N/A 12/31/2016   Procedure: LAPAROSCOPIC CHOLECYSTECTOMY WITH POSSIBLE INTRAOPERATIVE CHOLANGIOGRAM;  Surgeon: Rolm Bookbinder, MD;  Location: [place];  Service: General;  Laterality: N/A;  . COLONOSCOPY    . EYE SURGERY     bil cataract surgery  . HIATAL HERNIA REPAIR  2003  . Lymph node removal  2008  . MASTECTOMY MODIFIED RADICAL Left 01/26/2014   Procedure: LEFT MODIFIED RADICAL MASTECTOMY;  Surgeon: Jamesetta So, MD;  Location: [facility];  Service: General;  Laterality: Left;  . PORT-A-CATH REMOVAL  2009 ish  . PORTA CATH INSERTION  [id number]  . PORTACATH PLACEMENT Right 06/23/2019   Procedure: INSERTION PORT-A-CATH;  Surgeon: Aviva Signs, MD;  Location: [facility];  Service: General;  Laterality: Right;  . ROBOTIC ASSISTED TOTAL HYSTERECTOMY WITH BILATERAL SALPINGO OOPHERECTOMY N/A 05/18/2019   Procedure: XI ROBOTIC ASSISTED TOTAL HYSTERECTOMY WITH BILATERAL SALPINGO OOPHORECTOMY;  Surgeon: Lafonda Mosses, MD;  Location: [facility];  Service: Gynecology;  Laterality: N/A;  . SENTINEL NODE BIOPSY N/A 05/18/2019   Procedure: SENTINEL LYMPH NODE BIOPSY;  Surgeon: Lafonda Mosses, MD;  Location: [facility];  Service: Gynecology;  Laterality: N/A;  . TOTAL KNEE ARTHROPLASTY Right 2006  . TOTAL KNEE ARTHROPLASTY  08/12/2011    Procedure: TOTAL KNEE ARTHROPLASTY;  Surgeon: Arther Abbott, MD;  Location: [facility];  Service: Orthopedics;  Laterality: Left;  . TUBAL LIGATION  1980     SOCIAL HISTORY:  Social History   Socioeconomic History  . Marital status: Widowed    Spouse name: Not on file  . Number of children: 2  . Years of education: 97  . Highest education level: Not on file  Occupational History  . Occupation: retired  Tobacco Use  . Smoking status: Former Smoker    Packs/day: 3.00    Years: 25.00    Pack years: 75.00    Types: Cigarettes    Quit date: 06/30/1997    Years since quitting: 22.0  . Smokeless tobacco: Never Used  Substance and Sexual Activity  . Alcohol use: Yes    Alcohol/week: 0.0 standard drinks    Comment: 2 glasses wine per month  . Drug use: No  .  Sexual activity: Not Currently    Birth control/protection: Surgical    Comment: tubal  Other Topics Concern  . Not on file  Social History Narrative   Drinks 2-3 cups of coffee a day    Social Determinants of Health   Financial Resource Strain:   . Difficulty of Paying Living Expenses: Not on file  Food Insecurity:   . Worried About Charity fundraiser in the Last Year: Not on file  . Ran Out of Food in the Last Year: Not on file  Transportation Needs:   . Lack of Transportation (Medical): Not on file  . Lack of Transportation (Non-Medical): Not on file  Physical Activity:   . Days of Exercise per Week: Not on file  . Minutes of Exercise per Session: Not on file  Stress:   . Feeling of Stress : Not on file  Social Connections:   . Frequency of Communication with Friends and Family: Not on file  . Frequency of Social Gatherings with Friends and Family: Not on file  . Attends Religious Services: Not on file  . Active Member of Clubs or Organizations: Not on file  . Attends Archivist Meetings: Not on file  . Marital Status: Not on file  Intimate Partner Violence:   . Fear of Current or Ex-Partner: Not  on file  . Emotionally Abused: Not on file  . Physically Abused: Not on file  . Sexually Abused: Not on file    FAMILY HISTORY:  Family History  Problem Relation Age of Onset  . Stroke Father   . Heart disease Father   . Skin cancer Sister        dx. 58-58; unknown type  . Throat cancer Maternal Uncle 75       smoker; voicebox removed  . Alzheimer's disease Paternal Grandmother   . Heart attack Paternal Grandfather   . Alzheimer's disease Maternal Aunt   . Heart disease Maternal Uncle   . Lung cancer Cousin        smoker  . Lung cancer Paternal Aunt        dx. mid-70s; worked at [employer]  . Heart attack Daughter        caused by thyroid issues  . Heart disease Other   . Arthritis Other   . Lung disease Other   . Cancer Other   . Kidney disease Other   . Colon cancer Other   . Ovarian cancer Other   . Endometrial cancer Other     CURRENT MEDICATIONS:  Outpatient Encounter Medications as of 07/26/2019  Medication Sig  . apixaban (ELIQUIS) 5 MG TABS tablet Take 1 tablet (5 mg total) by mouth 2 (two) times daily.  Marland Kitchen buPROPion (WELLBUTRIN XL) 300 MG 24 hr tablet Take 300 mg by mouth daily.  Marland Kitchen CARBOPLATIN IV Inject into the vein every 21 ( twenty-one) days.  . furosemide (LASIX) 40 MG tablet Take 1 tablet (40 mg total) by mouth 2 (two) times daily.  Marland Kitchen lidocaine-prilocaine (EMLA) cream Apply a small amount to port a cath site and cover with plastic wrap 1 hour prior to chemotherapy appointments  . loratadine (CLARITIN) 10 MG tablet Take 10 mg by mouth daily.  . magnesium oxide (MAG-OX) 400 (241.3 Mg) MG tablet Take 1 tablet (400 mg total) by mouth 3 (three) times daily.  . metoprolol tartrate (LOPRESSOR) 25 MG tablet Take 1 tablet (25 mg total) by mouth 2 (two) times daily.  Marland Kitchen omeprazole (PRILOSEC) 20 MG  capsule Take 20 mg by mouth 2 (two) times daily.   Marland Kitchen PACLITAXEL IV Inject into the vein every 21 ( twenty-one) days.  . potassium chloride SA (KLOR-CON M20) 20 MEQ  tablet Take 2 tablets (40 mEq total) by mouth every morning. & 20 meq in the evening (Patient taking differently: Take 20 mEq by mouth 2 (two) times daily. )  . rosuvastatin (CRESTOR) 20 MG tablet Take 20 mg by mouth at bedtime.   . sertraline (ZOLOFT) 100 MG tablet Take 100 mg by mouth at bedtime.  Marland Kitchen albuterol (PROVENTIL) (2.5 MG/3ML) 0.083% nebulizer solution Take 3 mLs by nebulization 4 (four) times daily as needed for wheezing or shortness of breath.   Marland Kitchen albuterol (VENTOLIN HFA) 108 (90 Base) MCG/ACT inhaler Inhale 1-2 puffs into the lungs every 6 (six) hours as needed for wheezing or shortness of breath.  . Calcium Carbonate-Vit D-Min (CALCIUM 1200 PO) Take 1 tablet by mouth daily.   . prochlorperazine (COMPAZINE) 10 MG tablet Take 1 tablet (10 mg total) by mouth every 6 (six) hours as needed (Nausea or vomiting). (Patient not taking: Reported on 07/26/2019)  . traMADol (ULTRAM) 50 MG tablet Take 1 tablet (50 mg total) by mouth every 6 (six) hours as needed for severe pain. (Patient not taking: Reported on 07/26/2019)  . [DISCONTINUED] fexofenadine (ALLEGRA) 180 MG tablet Take 180 mg by mouth daily.  . [DISCONTINUED] ibuprofen (ADVIL) 600 MG tablet Take 1 tablet (600 mg total) by mouth every 6 (six) hours as needed for moderate pain. For AFTER surgery   No facility-administered encounter medications on file as of 07/26/2019.    ALLERGIES:  No Known Allergies   PHYSICAL EXAM:  ECOG Performance status: 1  Vitals:   07/26/19 0843  BP: 130/66  Pulse: 86  Resp: 18  Temp: (!) 97.4 F (36.3 C)  SpO2: 97%   Filed Weights   07/26/19 0843  Weight: 258 lb (117 kg)    Physical Exam Vitals reviewed.  Constitutional:      Appearance: Normal appearance.  Cardiovascular:     Rate and Rhythm: Normal rate and regular rhythm.     Heart sounds: Normal heart sounds.  Pulmonary:     Effort: Pulmonary effort is normal.     Breath sounds: Normal breath sounds.  Abdominal:     General: There  is no distension.     Palpations: Abdomen is soft. There is no mass.  Musculoskeletal:        General: No swelling.  Skin:    General: Skin is warm.  Neurological:     General: No focal deficit present.     Mental Status: She is alert and oriented to person, place, and time.  Psychiatric:        Mood and Affect: Mood normal.        Behavior: Behavior normal.      LABORATORY DATA:  I have reviewed the labs as listed.  CBC    Component Value Date/Time   WBC 7.3 07/26/2019 0840   RBC 3.53 (L) 07/26/2019 0840   HGB 10.7 (L) 07/26/2019 0840   HCT 33.0 (L) 07/26/2019 0840   PLT 473 (H) 07/26/2019 0840   MCV 93.5 07/26/2019 0840   MCH 30.3 07/26/2019 0840   MCHC 32.4 07/26/2019 0840   RDW 14.5 07/26/2019 0840   LYMPHSABS 1.8 07/26/2019 0840   MONOABS 0.8 07/26/2019 0840   EOSABS 0.0 07/26/2019 0840   BASOSABS 0.1 07/26/2019 0840   CMP Latest Ref Rng &  Units 07/26/2019 07/13/2019 07/05/2019  Glucose 70 - 99 mg/dL 138(H) 160(H) 137(H)  BUN 8 - 23 mg/dL 11 13 13   Creatinine 0.44 - 1.00 mg/dL 0.75 0.73 0.72  Sodium 135 - 145 mmol/L 140 138 139  Potassium 3.5 - 5.1 mmol/L 3.9 3.2(L) 3.7  Chloride 98 - 111 mmol/L 101 100 101  CO2 22 - 32 mmol/L 28 28 27   Calcium 8.9 - 10.3 mg/dL 9.0 8.2(L) 9.1  Total Protein 6.5 - 8.1 g/dL 6.8 6.6 7.1  Total Bilirubin 0.3 - 1.2 mg/dL 0.4 0.6 0.8  Alkaline Phos 38 - 126 U/L 77 73 70  AST 15 - 41 U/L 21 22 19   ALT 0 - 44 U/L 22 23 22        DIAGNOSTIC IMAGING:  I have independently reviewed the scans and discussed with the patient.     ASSESSMENT & PLAN:   Endometrial cancer ([place]) 1.  Stage III C1 (PT1BPN1A) endometrioid adenocarcinoma: -[place] surgery on 05/18/2019. -CT CAP on 06/02/2019 did not show any evidence of metastatic disease. -Cycle 1 of carboplatin and paclitaxel on 07/05/2019. -She denies any tingling or numbness in extremities.  She had allergic reaction to Taxol 30 minutes after initiation with difficulty breathing and  abdominal cramping during cycle 1. -I have reviewed her labs.  We will add extra Solu-Medrol 125 mg IV today in the premeds.  She will also get Pepcid along with Benadryl. -We will see her back in 3 weeks for follow-up.  2.  Severe body pains: -She got severe body pains after her chemotherapy for 4 to 5 days. -I have given prescription for tramadol 50 mg to be taken as needed.  3.  Hypomagnesemia: -She is taking magnesium 3 times a day at home. -Magnesium level today is 1.5.  She will receive 2 g of IV magnesium.  4.  Hypokalemia: -She takes potassium 20 mEq twice daily. -Today potassium is normal.  5.  Hypertension: -She will continue metoprolol 25 mg twice her blood pressure today is normal.  Denies any dizziness.     Orders placed this encounter:  No orders of the defined types were placed in this encounter.     Derek Jack, MD [place] [phone]

## 2019-07-26 NOTE — Patient Instructions (Signed)
[facility] Discharge Instructions for Patients Receiving Chemotherapy  Today you received the following chemotherapy agents   To help prevent nausea and vomiting after your treatment, we encourage you to take your nausea medication   If you develop nausea and vomiting that is not controlled by your nausea medication, call the clinic.   BELOW ARE SYMPTOMS THAT SHOULD BE REPORTED IMMEDIATELY:  *FEVER GREATER THAN 100.5 F  *CHILLS WITH OR WITHOUT FEVER  NAUSEA AND VOMITING THAT IS NOT CONTROLLED WITH YOUR NAUSEA MEDICATION  *UNUSUAL SHORTNESS OF BREATH  *UNUSUAL BRUISING OR BLEEDING  TENDERNESS IN MOUTH AND THROAT WITH OR WITHOUT PRESENCE OF ULCERS  *URINARY PROBLEMS  *BOWEL PROBLEMS  UNUSUAL RASH Items with * indicate a potential emergency and should be followed up as soon as possible.  Feel free to call the clinic should you have any questions or concerns. The clinic phone number is [phone].  Please show the CHEMO ALERT CARD at check-in to the Emergency Department and triage nurse.   

## 2019-07-26 NOTE — Patient Instructions (Addendum)
[facility] at [facility] Discharge Instructions  You were seen today by Dr. Katragadda. He went over your recent lab results. He will see you back in 3 weeks for labs, treatment and follow up.   Thank you for choosing [facility] at [facility] to provide your oncology and hematology care.  To afford each patient quality time with our provider, please arrive at least 15 minutes before your scheduled appointment time.   If you have a lab appointment with the Cancer Center please come in thru the  Main Entrance and check in at the main information desk  You need to re-schedule your appointment should you arrive 10 or more minutes late.  We strive to give you quality time with our providers, and arriving late affects you and other patients whose appointments are after yours.  Also, if you no show three or more times for appointments you may be dismissed from the clinic at the providers discretion.     Again, thank you for choosing [facility].  Our hope is that these requests will decrease the amount of time that you wait before being seen by our physicians.       _____________________________________________________________  Should you have questions after your visit to [facility], please contact our office at [phone] between the hours of 8:00 a.m. and 4:30 p.m.  Voicemails left after 4:00 p.m. will not be returned until the following business day.  For prescription refill requests, have your pharmacy contact our office and allow 72 hours.    Cancer Center Support Programs:   > Cancer Support Group  2nd Tuesday of the month 1pm-2pm, Journey Room    

## 2019-07-26 NOTE — Progress Notes (Signed)
Patient has been assessed, vital signs and labs have been reviewed by Dr. Delton Coombes. ANC, Creatinine, LFTs, and Platelets are within treatment parameters, magnesium is low today at 1.5, please give 2 grams IV magnesium today per Dr. Delton Coombes. The patient is good to proceed with treatment at this time.

## 2019-07-26 NOTE — Progress Notes (Signed)
Stephanie Jarvis tolerated treatment today without incident or complaint. VSS upon completion of treatment. Port flushed and deaccessed. Discharged via wheelchair in satisfactory condition with follow up instructions.

## 2019-07-28 ENCOUNTER — Encounter (HOSPITAL_COMMUNITY): Payer: Self-pay

## 2019-07-28 ENCOUNTER — Inpatient Hospital Stay (HOSPITAL_COMMUNITY): Payer: Medicare Other

## 2019-07-28 ENCOUNTER — Other Ambulatory Visit: Payer: Self-pay

## 2019-07-28 VITALS — BP 116/81 | HR 82 | Temp 97.1°F | Resp 20

## 2019-07-28 DIAGNOSIS — M791 Myalgia, unspecified site: Secondary | ICD-10-CM | POA: Diagnosis not present

## 2019-07-28 DIAGNOSIS — Z5189 Encounter for other specified aftercare: Secondary | ICD-10-CM | POA: Diagnosis not present

## 2019-07-28 DIAGNOSIS — Z5111 Encounter for antineoplastic chemotherapy: Secondary | ICD-10-CM | POA: Diagnosis not present

## 2019-07-28 DIAGNOSIS — Z95828 Presence of other vascular implants and grafts: Secondary | ICD-10-CM

## 2019-07-28 DIAGNOSIS — R42 Dizziness and giddiness: Secondary | ICD-10-CM | POA: Diagnosis not present

## 2019-07-28 DIAGNOSIS — R531 Weakness: Secondary | ICD-10-CM | POA: Diagnosis not present

## 2019-07-28 DIAGNOSIS — C541 Malignant neoplasm of endometrium: Secondary | ICD-10-CM

## 2019-07-28 MED ORDER — PEGFILGRASTIM-JMDB 6 MG/0.6ML ~~LOC~~ SOSY
6.0000 mg | PREFILLED_SYRINGE | Freq: Once | SUBCUTANEOUS | Status: AC
  Administered 2019-07-28: 6 mg via SUBCUTANEOUS
  Filled 2019-07-28: qty 0.6

## 2019-07-28 NOTE — Progress Notes (Signed)
Patient tolerated injection with no complaints voiced.  Site clean and dry with no bruising or swelling noted at site.  Band aid applied.  VSS with discharge and left ambulatory with no s/s of distress noted.    

## 2019-08-04 ENCOUNTER — Encounter: Payer: Self-pay | Admitting: Cardiology

## 2019-08-04 ENCOUNTER — Telehealth: Payer: Self-pay | Admitting: Cardiology

## 2019-08-04 NOTE — Progress Notes (Signed)
Virtual Visit via Telephone Note   This visit type was conducted due to national recommendations for restrictions regarding the COVID-19 Pandemic (e.g. social distancing) in an effort to limit this patient's exposure and mitigate transmission in our community.  Due to her co-morbid illnesses, this patient is at least at moderate risk for complications without adequate follow up.  This format is felt to be most appropriate for this patient at this time.  The patient did not have access to video technology/had technical difficulties with video requiring transitioning to audio format only (telephone).  All issues noted in this document were discussed and addressed.  No physical exam could be performed with this format.  Please refer to the patient's chart for her  consent to telehealth for [facility].   Date:  08/05/2019   ID:  Stephanie Jarvis, DOB 10/02/1950, MRN [id number]  Patient Location: Home Provider Location: Office  PCP:  [name], MD  Cardiologist:  Rozann Lesches, MD Electrophysiologist:  None   Evaluation Performed:  Follow-Up Visit  Chief Complaint:  Cardiac follow-up  History of Present Illness:    Stephanie Jarvis is a 69 y.o. female last seen in August 2020.  We spoke by phone today.  From a cardiac perspective, she has noticed only a single episode of elevated heart rate, no prolonged symptoms however, no chest pain.  I went over her medications which are listed below.  She continues on Lopressor as well as Eliquis.  I went over her recent lab work from January.  She does not describe any spontaneous bleeding problems.  We discussed arranging a follow-up echocardiogram.  She had a small to moderate sized pericardial effusion as of testing back in 2019.  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  Still undergoing active chemotherapy treatments.  The patient does not have symptoms concerning for COVID-19 infection  (fever, chills, cough, or new shortness of breath).  She plans to talk with her oncologist about whether she would be a candidate for the vaccine, particularly in light of ongoing chemotherapy and alterations in her immune response.   Past Medical History:  Diagnosis Date  . Ankle pain   . Anxiety   . Arthritis   . Asthma    Seasonal asthma  . Breast cancer ([place]) 2008   ILC of Left breast; ER+  . 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 Depression   . Endometrial cancer ([place])   . GERD (gastroesophageal reflux disease)   . H/O hiatal hernia 2003  . Hepatitis    history in early 20's.  Marland Kitchen History of atrial fibrillation 2011  . History of blood transfusion   . History of hepatitis A    Age 41  . History of pneumonia   . Hyperactive gag reflex   . Hyperlipidemia   . Hypertension   . Knee pain   . Obesity, Class III, BMI 40-49.9 (morbid obesity) ([place])   . OSA on CPAP   . Pneumonia   . Port-A-Cath in place 06/28/2019  . Sciatica   . Vertigo    Past Surgical History:  Procedure Laterality Date  . BREAST LUMPECTOMY Left 1982,2008   x 2   . CHOLECYSTECTOMY N/A 12/31/2016   Procedure: LAPAROSCOPIC CHOLECYSTECTOMY WITH POSSIBLE INTRAOPERATIVE CHOLANGIOGRAM;  Surgeon: Rolm Bookbinder, MD;  Location: [facility];  Service: General;  Laterality: N/A;  . COLONOSCOPY    . EYE SURGERY  bil cataract surgery  . HIATAL HERNIA REPAIR  2003  . Lymph node removal  2008  . MASTECTOMY MODIFIED RADICAL Left 01/26/2014   Procedure: LEFT MODIFIED RADICAL MASTECTOMY;  Surgeon: Jamesetta So, MD;  Location: [facility];  Service: General;  Laterality: Left;  . PORT-A-CATH REMOVAL  2009 ish  . PORTA CATH INSERTION  [id number]  . PORTACATH PLACEMENT Right 06/23/2019   Procedure: INSERTION PORT-A-CATH;  Surgeon: Aviva Signs, MD;  Location: [facility];  Service: General;  Laterality: Right;  . ROBOTIC ASSISTED TOTAL HYSTERECTOMY WITH BILATERAL SALPINGO OOPHERECTOMY N/A  05/18/2019   Procedure: XI ROBOTIC ASSISTED TOTAL HYSTERECTOMY WITH BILATERAL SALPINGO OOPHORECTOMY;  Surgeon: Lafonda Mosses, MD;  Location: [facility];  Service: Gynecology;  Laterality: N/A;  . SENTINEL NODE BIOPSY N/A 05/18/2019   Procedure: SENTINEL LYMPH NODE BIOPSY;  Surgeon: Lafonda Mosses, MD;  Location: [facility];  Service: Gynecology;  Laterality: N/A;  . TOTAL KNEE ARTHROPLASTY Right 2006  . TOTAL KNEE ARTHROPLASTY  08/12/2011   Procedure: TOTAL KNEE ARTHROPLASTY;  Surgeon: Arther Abbott, MD;  Location: [facility];  Service: Orthopedics;  Laterality: Left;  . TUBAL LIGATION  1980     Current Meds  Medication Sig  . albuterol (PROVENTIL) (2.5 MG/3ML) 0.083% nebulizer solution Take 3 mLs by nebulization 4 (four) times daily as needed for wheezing or shortness of breath.   Marland Kitchen albuterol (VENTOLIN HFA) 108 (90 Base) MCG/ACT inhaler Inhale 1-2 puffs into the lungs every 6 (six) hours as needed for wheezing or shortness of breath.  Marland Kitchen apixaban (ELIQUIS) 5 MG TABS tablet Take 1 tablet (5 mg total) by mouth 2 (two) times daily.  Marland Kitchen buPROPion (WELLBUTRIN XL) 300 MG 24 hr tablet Take 300 mg by mouth daily.  . Calcium Carbonate-Vit D-Min (CALCIUM 1200 PO) Take 1 tablet by mouth daily.   . furosemide (LASIX) 40 MG tablet Take 1 tablet (40 mg total) by mouth 2 (two) times daily.  Marland Kitchen loratadine (CLARITIN) 10 MG tablet Take 10 mg by mouth daily.  . magnesium oxide (MAG-OX) 400 (241.3 Mg) MG tablet Take 1 tablet (400 mg total) by mouth 3 (three) times daily.  . metoprolol tartrate (LOPRESSOR) 25 MG tablet Take 1 tablet (25 mg total) by mouth 2 (two) times daily.  Marland Kitchen omeprazole (PRILOSEC) 20 MG capsule Take 20 mg by mouth 2 (two) times daily.   . potassium chloride SA (KLOR-CON M20) 20 MEQ tablet Take 2 tablets (40 mEq total) by mouth every morning. & 20 meq in the evening (Patient taking differently: Take 20 mEq by mouth 2 (two) times daily. )  . rosuvastatin (CRESTOR) 20 MG tablet Take 20 mg by mouth  at bedtime.   . sertraline (ZOLOFT) 100 MG tablet Take 100 mg by mouth at bedtime.  . traMADol (ULTRAM) 50 MG tablet Take 1 tablet (50 mg total) by mouth every 6 (six) hours as needed for severe pain.     Allergies:   Patient has no known allergies.   Social History   Tobacco Use  . Smoking status: Former Smoker    Packs/day: 3.00    Years: 25.00    Pack years: 75.00    Types: Cigarettes    Quit date: 06/30/1997    Years since quitting: 22.1  . Smokeless tobacco: Never Used  Substance Use Topics  . Alcohol use: Yes    Alcohol/week: 0.0 standard drinks    Comment: 2 glasses wine per month  . Drug use: No     Family Hx:  The patient's family history includes Alzheimer's disease in her maternal aunt and paternal grandmother; Arthritis in an other family member; Cancer in an other family member; Colon cancer in an other family member; Endometrial cancer in an other family member; Heart attack in her daughter and paternal grandfather; Heart disease in her father, maternal uncle, and another family member; Kidney disease in an other family member; Lung cancer in her cousin and paternal aunt; Lung disease in an other family member; Ovarian cancer in an other family member; Skin cancer in her sister; Stroke in her father; Throat cancer (age of onset: 54) in her maternal uncle.  ROS:   Please see the history of present illness. All other systems reviewed and are negative.   Prior CV studies:   The following studies were reviewed today:  Echocardiogram 02/17/2018: Study Conclusions  - Limited echo to evaluate pericardial effusion. - Left ventricle: The cavity size was normal. Wall thickness was increased in a pattern of mild LVH. Systolic function was normal. The estimated ejection fraction was in the range of 60% to 65%. Wall motion was normal; there were no regional wall motion abnormalities. - Inferior vena cava: The vessel was normal in size. The respirophasic  diameter changes were in the normal range (= 50%), consistent with normal central venous pressure. - Pericardium, extracardiac: There is a small to moderate circumferential pericardial effusion measuring 1.3 cm in diastole adjacent to the LV. There is no tamonade physiology by echo. - Stable, perhaps mildly decreased pericardial effusion compared to prior study 10/28/2017.  Labs/Other Tests and Data Reviewed:    EKG:  An ECG dated 02/03/2019 was personally reviewed today and demonstrated:  Sinus rhythm with poor R wave progression.  Recent Labs: 07/26/2019: ALT 22; BUN 11; Creatinine, Ser 0.75; Hemoglobin 10.7; Magnesium 1.5; Platelets 473; Potassium 3.9; Sodium 140    Wt Readings from Last 3 Encounters:  08/05/19 258 lb (117 kg)  07/26/19 258 lb (117 kg)  07/13/19 257 lb 6.4 oz (116.8 kg)     Objective:    Vital Signs:  Ht 5' 6"  (1.676 m)   Wt 258 lb (117 kg)   BMI 41.64 kg/m    Patient spoke in full sentences, not short of breath. No audible wheezing or coughing. Speech pattern normal.  ASSESSMENT & PLAN:    1.  Paroxysmal atrial fibrillation.  CHA2DS2-VASc score is 3.  Overall symptomatically stable, plan to continue Lopressor and Eliquis.  I reviewed her recent lab work.  2.  Small to moderate pericardial effusion by echocardiogram in 2019.  We will obtain a follow-up echocardiogram for reassessment.  Not clearly symptomatic.  3.  Mixed hyperlipidemia on Crestor.  She continues to follow with Dr. Legrand Rams.  4.  Endometrioid carcinoma, undergoing active chemotherapy with follow-up in the oncology clinic.   Time:   Today, I have spent 6 minutes with the patient with telehealth technology discussing the above problems.     Medication Adjustments/Labs and Tests Ordered: Current medicines are reviewed at length with the patient today.  Concerns regarding medicines are outlined above.   Tests Ordered: Orders Placed This Encounter  Procedures  . ECHOCARDIOGRAM  COMPLETE    Medication Changes: No orders of the defined types were placed in this encounter.   Follow Up:  In Person 6 months in the [place] office.  Signed, Rozann Lesches, MD  08/05/2019 10:29 AM    [place]

## 2019-08-04 NOTE — Telephone Encounter (Signed)

## 2019-08-05 ENCOUNTER — Encounter: Payer: Self-pay | Admitting: Cardiology

## 2019-08-05 ENCOUNTER — Telehealth: Payer: Self-pay | Admitting: Cardiology

## 2019-08-05 ENCOUNTER — Telehealth (INDEPENDENT_AMBULATORY_CARE_PROVIDER_SITE_OTHER): Payer: Medicare Other | Admitting: Cardiology

## 2019-08-05 VITALS — Ht 66.0 in | Wt 258.0 lb

## 2019-08-05 DIAGNOSIS — I480 Paroxysmal atrial fibrillation: Secondary | ICD-10-CM | POA: Diagnosis not present

## 2019-08-05 DIAGNOSIS — E782 Mixed hyperlipidemia: Secondary | ICD-10-CM

## 2019-08-05 DIAGNOSIS — I313 Pericardial effusion (noninflammatory): Secondary | ICD-10-CM

## 2019-08-05 DIAGNOSIS — I3139 Other pericardial effusion (noninflammatory): Secondary | ICD-10-CM

## 2019-08-05 DIAGNOSIS — C541 Malignant neoplasm of endometrium: Secondary | ICD-10-CM

## 2019-08-05 NOTE — Telephone Encounter (Signed)
Pre-cert Verification for the following procedure   Echo scheduled for 08/11/2019 at [facility].

## 2019-08-05 NOTE — Patient Instructions (Addendum)
Medication Instructions:    Your physician recommends that you continue on your current medications as directed. Please refer to the Current Medication list given to you today.  Labwork:  NONE  Testing/Procedures: Your physician has requested that you have an echocardiogram. Echocardiography is a painless test that uses sound waves to create images of your heart. It provides your doctor with information about the size and shape of your heart and how well your heart's chambers and valves are working. This procedure takes approximately one hour. There are no restrictions for this procedure.  Follow-Up:  Your physician recommends that you schedule a follow-up appointment in: 6 months (office). You will receive a reminder letter in the mail in about 4 months reminding you to call and schedule your appointment. If you don't receive this letter, please contact our office.  Any Other Special Instructions Will Be Listed Below (If Applicable).  If you need a refill on your cardiac medications before your next appointment, please call your pharmacy. 

## 2019-08-10 ENCOUNTER — Other Ambulatory Visit (HOSPITAL_COMMUNITY): Payer: Self-pay | Admitting: *Deleted

## 2019-08-10 DIAGNOSIS — C541 Malignant neoplasm of endometrium: Secondary | ICD-10-CM

## 2019-08-10 NOTE — Progress Notes (Signed)
Patient called cancer center reporting extreme exhaustion and fatigue. She reports that she has zero energy.    Orders placed for labs and fluids for tomorrow.   Patient aware of appointment time.

## 2019-08-11 ENCOUNTER — Other Ambulatory Visit: Payer: Self-pay

## 2019-08-11 ENCOUNTER — Ambulatory Visit (HOSPITAL_COMMUNITY)
Admission: RE | Admit: 2019-08-11 | Discharge: 2019-08-11 | Disposition: A | Discharge status: Home / Self Care | Payer: Medicare Other | Source: Ambulatory Visit | Attending: Cardiology | Admitting: Cardiology

## 2019-08-11 ENCOUNTER — Inpatient Hospital Stay (HOSPITAL_COMMUNITY): Payer: Medicare Other | Attending: Hematology

## 2019-08-11 ENCOUNTER — Inpatient Hospital Stay (HOSPITAL_COMMUNITY): Payer: Medicare Other

## 2019-08-11 ENCOUNTER — Encounter (HOSPITAL_COMMUNITY): Payer: Self-pay

## 2019-08-11 VITALS — BP 156/68 | HR 85 | Temp 97.1°F | Resp 18

## 2019-08-11 DIAGNOSIS — Z8249 Family history of ischemic heart disease and other diseases of the circulatory system: Secondary | ICD-10-CM | POA: Insufficient documentation

## 2019-08-11 DIAGNOSIS — Z8261 Family history of arthritis: Secondary | ICD-10-CM | POA: Diagnosis not present

## 2019-08-11 DIAGNOSIS — I3139 Other pericardial effusion (noninflammatory): Secondary | ICD-10-CM

## 2019-08-11 DIAGNOSIS — R52 Pain, unspecified: Secondary | ICD-10-CM | POA: Diagnosis not present

## 2019-08-11 DIAGNOSIS — E876 Hypokalemia: Secondary | ICD-10-CM

## 2019-08-11 DIAGNOSIS — Z841 Family history of disorders of kidney and ureter: Secondary | ICD-10-CM | POA: Insufficient documentation

## 2019-08-11 DIAGNOSIS — C541 Malignant neoplasm of endometrium: Secondary | ICD-10-CM | POA: Insufficient documentation

## 2019-08-11 DIAGNOSIS — Z801 Family history of malignant neoplasm of trachea, bronchus and lung: Secondary | ICD-10-CM | POA: Diagnosis not present

## 2019-08-11 DIAGNOSIS — I313 Pericardial effusion (noninflammatory): Secondary | ICD-10-CM | POA: Diagnosis not present

## 2019-08-11 DIAGNOSIS — Z808 Family history of malignant neoplasm of other organs or systems: Secondary | ICD-10-CM | POA: Insufficient documentation

## 2019-08-11 DIAGNOSIS — Z5189 Encounter for other specified aftercare: Secondary | ICD-10-CM | POA: Insufficient documentation

## 2019-08-11 DIAGNOSIS — Z823 Family history of stroke: Secondary | ICD-10-CM | POA: Diagnosis not present

## 2019-08-11 DIAGNOSIS — Z79899 Other long term (current) drug therapy: Secondary | ICD-10-CM | POA: Insufficient documentation

## 2019-08-11 DIAGNOSIS — Z800 Family history of malignant neoplasm of digestive organs: Secondary | ICD-10-CM | POA: Diagnosis not present

## 2019-08-11 DIAGNOSIS — Z87891 Personal history of nicotine dependence: Secondary | ICD-10-CM | POA: Insufficient documentation

## 2019-08-11 DIAGNOSIS — Z8041 Family history of malignant neoplasm of ovary: Secondary | ICD-10-CM | POA: Diagnosis not present

## 2019-08-11 DIAGNOSIS — Z5111 Encounter for antineoplastic chemotherapy: Secondary | ICD-10-CM | POA: Insufficient documentation

## 2019-08-11 DIAGNOSIS — Z818 Family history of other mental and behavioral disorders: Secondary | ICD-10-CM | POA: Diagnosis not present

## 2019-08-11 DIAGNOSIS — Z170 Estrogen receptor positive status [ER+]: Secondary | ICD-10-CM | POA: Insufficient documentation

## 2019-08-11 DIAGNOSIS — I10 Essential (primary) hypertension: Secondary | ICD-10-CM | POA: Diagnosis not present

## 2019-08-11 DIAGNOSIS — C50112 Malignant neoplasm of central portion of left female breast: Secondary | ICD-10-CM | POA: Insufficient documentation

## 2019-08-11 LAB — CBC WITH DIFFERENTIAL/PLATELET
Abs Immature Granulocytes: 0.09 10*3/uL — ABNORMAL HIGH (ref 0.00–0.07)
Basophils Absolute: 0 10*3/uL (ref 0.0–0.1)
Basophils Relative: 0 %
Eosinophils Absolute: 0.1 10*3/uL (ref 0.0–0.5)
Eosinophils Relative: 2 %
HCT: 28.9 % — ABNORMAL LOW (ref 36.0–46.0)
Hemoglobin: 9.7 g/dL — ABNORMAL LOW (ref 12.0–15.0)
Immature Granulocytes: 1 %
Lymphocytes Relative: 26 %
Lymphs Abs: 2.3 10*3/uL (ref 0.7–4.0)
MCH: 31.3 pg (ref 26.0–34.0)
MCHC: 33.6 g/dL (ref 30.0–36.0)
MCV: 93.2 fL (ref 80.0–100.0)
Monocytes Absolute: 0.6 10*3/uL (ref 0.1–1.0)
Monocytes Relative: 7 %
Neutro Abs: 5.8 10*3/uL (ref 1.7–7.7)
Neutrophils Relative %: 64 %
Platelets: 121 10*3/uL — ABNORMAL LOW (ref 150–400)
RBC: 3.1 MIL/uL — ABNORMAL LOW (ref 3.87–5.11)
RDW: 14.6 % (ref 11.5–15.5)
WBC: 9 10*3/uL (ref 4.0–10.5)
nRBC: 0 % (ref 0.0–0.2)

## 2019-08-11 LAB — COMPREHENSIVE METABOLIC PANEL
ALT: 21 U/L (ref 0–44)
AST: 20 U/L (ref 15–41)
Albumin: 3.7 g/dL (ref 3.5–5.0)
Alkaline Phosphatase: 92 U/L (ref 38–126)
Anion gap: 9 (ref 5–15)
BUN: 13 mg/dL (ref 8–23)
CO2: 26 mmol/L (ref 22–32)
Calcium: 8.9 mg/dL (ref 8.9–10.3)
Chloride: 102 mmol/L (ref 98–111)
Creatinine, Ser: 0.77 mg/dL (ref 0.44–1.00)
GFR calc Af Amer: >60 mL/min (ref >60)
GFR calc non Af Amer: >60 mL/min (ref >60)
Glucose, Bld: 139 mg/dL — ABNORMAL HIGH (ref 70–99)
Potassium: 4.1 mmol/L (ref 3.5–5.1)
Sodium: 137 mmol/L (ref 135–145)
Total Bilirubin: 0.6 mg/dL (ref 0.3–1.2)
Total Protein: 7 g/dL (ref 6.5–8.1)

## 2019-08-11 LAB — TYPE AND SCREEN
ABO/RH(D): O POS
Antibody Screen: NEGATIVE

## 2019-08-11 LAB — ECHOCARDIOGRAM COMPLETE

## 2019-08-11 LAB — MAGNESIUM: Magnesium: 1.5 mg/dL — ABNORMAL LOW (ref 1.7–2.4)

## 2019-08-11 MED ORDER — SODIUM CHLORIDE 0.9% FLUSH: 10.0000 mL | Freq: Once | INTRAVENOUS | Status: DC | PRN

## 2019-08-11 MED ORDER — HEPARIN SOD (PORK) LOCK FLUSH 100 UNIT/ML IV SOLN
500.0000 [IU] | Freq: Once | INTRAVENOUS | Status: AC | PRN
  Administered 2019-08-11: 500 [IU]

## 2019-08-11 MED ORDER — SODIUM CHLORIDE 0.9 % IV SOLN
Freq: Once | INTRAVENOUS | Status: AC
  Filled 2019-08-11: qty 1000

## 2019-08-11 NOTE — Progress Notes (Signed)
*  PRELIMINARY RESULTS* Echocardiogram 2D Echocardiogram has been performed.  Stephanie Jarvis 08/11/2019, 11:40 AM

## 2019-08-11 NOTE — Progress Notes (Addendum)
Labs reviewed with Cristela Felt, NP.  She was made aware of Mg++ of 1.5.  Pt is currently receiving 1 L NS with KCl 20 mEq and Mg++ 2 g.  Per NP, this is sufficient supplementation for her hypomagnesemia.  Pt confirms she is taking magnesium TID as prescribed. Pt made aware of all lab results.

## 2019-08-12 ENCOUNTER — Telehealth: Payer: Self-pay | Admitting: *Deleted

## 2019-08-12 NOTE — Telephone Encounter (Signed)
-----   Message from Satira Sark, MD sent at 08/11/2019  4:53 PM EST ----- Results reviewed.  LVEF is normal at 60 to 65%.  Pericardial effusion is moderate and predominantly posterior, no evidence of hemodynamic compromise however.  No progressive valvular abnormalities.  We will continue with current observation plan.

## 2019-08-12 NOTE — Telephone Encounter (Signed)
Patient informed. Copy sent to PCP °

## 2019-08-16 ENCOUNTER — Inpatient Hospital Stay (HOSPITAL_BASED_OUTPATIENT_CLINIC_OR_DEPARTMENT_OTHER): Payer: Medicare Other | Admitting: Hematology

## 2019-08-16 ENCOUNTER — Inpatient Hospital Stay (HOSPITAL_COMMUNITY): Payer: Medicare Other

## 2019-08-16 ENCOUNTER — Encounter (HOSPITAL_COMMUNITY): Payer: Self-pay | Admitting: Hematology

## 2019-08-16 ENCOUNTER — Other Ambulatory Visit: Payer: Self-pay

## 2019-08-16 VITALS — BP 128/70 | HR 100 | Resp 17

## 2019-08-16 DIAGNOSIS — Z5189 Encounter for other specified aftercare: Secondary | ICD-10-CM | POA: Diagnosis not present

## 2019-08-16 DIAGNOSIS — R52 Pain, unspecified: Secondary | ICD-10-CM | POA: Diagnosis not present

## 2019-08-16 DIAGNOSIS — C541 Malignant neoplasm of endometrium: Secondary | ICD-10-CM

## 2019-08-16 DIAGNOSIS — Z170 Estrogen receptor positive status [ER+]: Secondary | ICD-10-CM | POA: Diagnosis not present

## 2019-08-16 DIAGNOSIS — Z5111 Encounter for antineoplastic chemotherapy: Secondary | ICD-10-CM | POA: Diagnosis not present

## 2019-08-16 DIAGNOSIS — Z95828 Presence of other vascular implants and grafts: Secondary | ICD-10-CM

## 2019-08-16 DIAGNOSIS — C50112 Malignant neoplasm of central portion of left female breast: Secondary | ICD-10-CM | POA: Diagnosis not present

## 2019-08-16 LAB — COMPREHENSIVE METABOLIC PANEL
ALT: 19 U/L (ref 0–44)
AST: 18 U/L (ref 15–41)
Albumin: 3.4 g/dL — ABNORMAL LOW (ref 3.5–5.0)
Alkaline Phosphatase: 84 U/L (ref 38–126)
Anion gap: 10 (ref 5–15)
BUN: 12 mg/dL (ref 8–23)
CO2: 27 mmol/L (ref 22–32)
Calcium: 8.9 mg/dL (ref 8.9–10.3)
Chloride: 101 mmol/L (ref 98–111)
Creatinine, Ser: 0.82 mg/dL (ref 0.44–1.00)
GFR calc Af Amer: >60 mL/min (ref >60)
GFR calc non Af Amer: >60 mL/min (ref >60)
Glucose, Bld: 183 mg/dL — ABNORMAL HIGH (ref 70–99)
Potassium: 3.5 mmol/L (ref 3.5–5.1)
Sodium: 138 mmol/L (ref 135–145)
Total Bilirubin: 0.6 mg/dL (ref 0.3–1.2)
Total Protein: 6.7 g/dL (ref 6.5–8.1)

## 2019-08-16 LAB — CBC WITH DIFFERENTIAL/PLATELET
Abs Immature Granulocytes: 0.01 10*3/uL (ref 0.00–0.07)
Basophils Absolute: 0 10*3/uL (ref 0.0–0.1)
Basophils Relative: 0 %
Eosinophils Absolute: 0 10*3/uL (ref 0.0–0.5)
Eosinophils Relative: 1 %
HCT: 28.1 % — ABNORMAL LOW (ref 36.0–46.0)
Hemoglobin: 9.1 g/dL — ABNORMAL LOW (ref 12.0–15.0)
Immature Granulocytes: 0 %
Lymphocytes Relative: 24 %
Lymphs Abs: 1.3 10*3/uL (ref 0.7–4.0)
MCH: 31.5 pg (ref 26.0–34.0)
MCHC: 32.4 g/dL (ref 30.0–36.0)
MCV: 97.2 fL (ref 80.0–100.0)
Monocytes Absolute: 0.5 10*3/uL (ref 0.1–1.0)
Monocytes Relative: 8 %
Neutro Abs: 3.7 10*3/uL (ref 1.7–7.7)
Neutrophils Relative %: 67 %
Platelets: 283 10*3/uL (ref 150–400)
RBC: 2.89 MIL/uL — ABNORMAL LOW (ref 3.87–5.11)
RDW: 18.4 % — ABNORMAL HIGH (ref 11.5–15.5)
WBC: 5.5 10*3/uL (ref 4.0–10.5)
nRBC: 0 % (ref 0.0–0.2)

## 2019-08-16 LAB — MAGNESIUM: Magnesium: 1.5 mg/dL — ABNORMAL LOW (ref 1.7–2.4)

## 2019-08-16 MED ORDER — MAGNESIUM OXIDE 400 (241.3 MG) MG PO TABS: 800.0000 mg | ORAL_TABLET | Freq: Two times a day (BID) | ORAL | 2 refills | Status: DC

## 2019-08-16 MED ORDER — FAMOTIDINE IN NACL 20-0.9 MG/50ML-% IV SOLN
20.0000 mg | Freq: Once | INTRAVENOUS | Status: AC
  Administered 2019-08-16: 20 mg via INTRAVENOUS
  Filled 2019-08-16: qty 50

## 2019-08-16 MED ORDER — SODIUM CHLORIDE 0.9 % IV SOLN
175.0000 mg/m2 | Freq: Once | INTRAVENOUS | Status: AC
  Administered 2019-08-16: 13:00:00 414 mg via INTRAVENOUS
  Filled 2019-08-16: qty 69

## 2019-08-16 MED ORDER — SODIUM CHLORIDE 0.9% FLUSH
10.0000 mL | INTRAVENOUS | Status: DC | PRN
  Administered 2019-08-16: 10 mL

## 2019-08-16 MED ORDER — MAGNESIUM SULFATE 2 GM/50ML IV SOLN
2.0000 g | Freq: Once | INTRAVENOUS | Status: AC
  Administered 2019-08-16: 12:00:00 2 g via INTRAVENOUS
  Filled 2019-08-16: qty 50

## 2019-08-16 MED ORDER — TRAMADOL HCL 50 MG PO TABS: 50.0000 mg | ORAL_TABLET | Freq: Four times a day (QID) | ORAL | 0 refills | Status: DC | PRN

## 2019-08-16 MED ORDER — SODIUM CHLORIDE 0.9 % IV SOLN
745.2000 mg | Freq: Once | INTRAVENOUS | Status: AC
  Administered 2019-08-16: 16:00:00 750 mg via INTRAVENOUS
  Filled 2019-08-16: qty 75

## 2019-08-16 MED ORDER — HEPARIN SOD (PORK) LOCK FLUSH 100 UNIT/ML IV SOLN
500.0000 [IU] | Freq: Once | INTRAVENOUS | Status: AC | PRN
  Administered 2019-08-16: 17:00:00 500 [IU]

## 2019-08-16 MED ORDER — SODIUM CHLORIDE 0.9 % IV SOLN: Freq: Once | INTRAVENOUS | Status: AC

## 2019-08-16 MED ORDER — SODIUM CHLORIDE 0.9 % IV SOLN
10.0000 mg | Freq: Once | INTRAVENOUS | Status: AC
  Administered 2019-08-16: 10 mg via INTRAVENOUS
  Filled 2019-08-16: qty 10

## 2019-08-16 MED ORDER — DIPHENHYDRAMINE HCL 50 MG/ML IJ SOLN
50.0000 mg | Freq: Once | INTRAMUSCULAR | Status: AC
  Administered 2019-08-16: 12:00:00 50 mg via INTRAVENOUS
  Filled 2019-08-16: qty 1

## 2019-08-16 MED ORDER — PALONOSETRON HCL INJECTION 0.25 MG/5ML
0.2500 mg | Freq: Once | INTRAVENOUS | Status: AC
  Administered 2019-08-16: 0.25 mg via INTRAVENOUS
  Filled 2019-08-16: qty 5

## 2019-08-16 MED ORDER — METHYLPREDNISOLONE SODIUM SUCC 125 MG IJ SOLR
125.0000 mg | Freq: Once | INTRAMUSCULAR | Status: AC
  Administered 2019-08-16: 12:00:00 125 mg via INTRAVENOUS
  Filled 2019-08-16: qty 2

## 2019-08-16 MED ORDER — SODIUM CHLORIDE 0.9 % IV SOLN
150.0000 mg | Freq: Once | INTRAVENOUS | Status: AC
  Administered 2019-08-16: 12:00:00 150 mg via INTRAVENOUS
  Filled 2019-08-16: qty 150

## 2019-08-16 NOTE — Patient Instructions (Signed)
[facility] Discharge Instructions for Patients Receiving Chemotherapy  Today you received the following chemotherapy agents   To help prevent nausea and vomiting after your treatment, we encourage you to take your nausea medication   If you develop nausea and vomiting that is not controlled by your nausea medication, call the clinic.   BELOW ARE SYMPTOMS THAT SHOULD BE REPORTED IMMEDIATELY:  *FEVER GREATER THAN 100.5 F  *CHILLS WITH OR WITHOUT FEVER  NAUSEA AND VOMITING THAT IS NOT CONTROLLED WITH YOUR NAUSEA MEDICATION  *UNUSUAL SHORTNESS OF BREATH  *UNUSUAL BRUISING OR BLEEDING  TENDERNESS IN MOUTH AND THROAT WITH OR WITHOUT PRESENCE OF ULCERS  *URINARY PROBLEMS  *BOWEL PROBLEMS  UNUSUAL RASH Items with * indicate a potential emergency and should be followed up as soon as possible.  Feel free to call the clinic should you have any questions or concerns. The clinic phone number is [phone].  Please show the CHEMO ALERT CARD at check-in to the Emergency Department and triage nurse.   

## 2019-08-16 NOTE — Assessment & Plan Note (Addendum)
1.  Stage III C1 (PT1BPN1A) endometrioid adenocarcinoma: -Debulking surgery on 05/18/2019. -CT CAP on 06/02/2019 did not show any evidence of metastatic disease. -2 cycles of carboplatin and paclitaxel on 07/05/2019 and 07/26/2019. -We reviewed CBC which is grossly stable.  LFTs are normal.  She will proceed with cycle 3 today. -She will receive Solu-Medrol 125 mg in the premedication along with Pepcid and Benadryl as she had reaction to Taxol during cycle 1. -She will be seen back in 3 weeks for follow-up.  2.  Severe body pains: -They are better with cycle 2.  She started taking tramadol 50 mg twice daily during the first week which helped.  She also took some tramadol during second week. -I have sent a refill for tramadol.  3.  Hypomagnesemia: -She is taking magnesium 3 times a day.  Magnesium today is 1.5.  She will receive IV magnesium. -I will increase her magnesium to 2 tablets twice daily.  4.  Hypokalemia: -She is taking potassium 20 mEq twice daily.  Potassium is 3.5.  5.  Hypertension: -Blood pressure today is 126/67.  She will continue metoprolol 25 mg twice daily. -She reported occasional lightheadedness.  We will keep a close eye on it.

## 2019-08-16 NOTE — Progress Notes (Signed)
Patient has been assessed, vital signs and labs have been reviewed by Dr. Delton Coombes. ANC, Creatinine, LFTs, and Platelets are within treatment parameters, magnesium is low today, please give 2 grams of magnesium IV per Dr. Delton Coombes. The patient is good to proceed with treatment at this time.

## 2019-08-16 NOTE — Progress Notes (Signed)
Stephanie Jarvis, [place] [address]   CLINIC:  Medical Oncology/Hematology  PCP:  [name], MD [place] [address] [phone]   REASON FOR VISIT:  Follow-up for endometrioid adenocarcinoma.  CURRENT THERAPY: Carboplatin and paclitaxel.  BRIEF ONCOLOGIC HISTORY:  Oncology History  Breast cancer ([place])  12/08/2013 Procedure   Left needle core biopsy   12/09/2013 Pathology Results   Invasive ductal carcinoma with DCIS, ER 100%, PR 31%, Ki-67 marker 4%.  Insufficient material for HER2 testing.   12/28/2013 Breast MRI   Post biopsy changes located within the left breast laterally (upper-outer quadrant) related to the patient's recent stereotactic biopsy. Also, postsurgical scarring changes within the left breast. No areas of worrisome enhancement within the right breast.   01/26/2014 Definitive Surgery   Left modified radical mastectomy by Dr. Arnoldo Morale   02/01/2014 Pathology Results   Intermediate grade DCIS, 0.5 cm, 0/6 lymph nodes, negative resection margins.   03/13/2016 Imaging   Bone density- BMD as determined from Femur Neck Right is 1.023 g/cm2 with a T-Score of -0.1. This patient is considered normal according to [facility]) criteria.   Endometrial cancer ([place])  04/22/2019 Initial Biopsy   EMB - Gr1 EMCA   04/28/2019 Initial Diagnosis   Endometrial cancer determined by uterine biopsy ([place])   05/18/2019 Surgery   TRH/BSO, bilateral SLNs SLNs bulky.    Pathologic Stage   IIIC1, +LVSI, outer half MI, endometrioid adenoca, G 1-2 MSI-H   07/05/2019 -  Chemotherapy   The patient had palonosetron (ALOXI) injection 0.25 mg, 0.25 mg, Intravenous,  Once, 2 of 6 cycles Administration: 0.25 mg (07/05/2019), 0.25 mg (07/26/2019) pegfilgrastim-jmdb (FULPHILA) injection 6 mg, 6 mg, Subcutaneous,  Once, 2 of 6 cycles Administration: 6 mg (07/07/2019), 6 mg (07/28/2019) CARBOplatin (PARAPLATIN) 750 mg  in sodium chloride 0.9 % 250 mL chemo infusion, 750 mg (100 % of original dose 745.2 mg), Intravenous,  Once, 2 of 6 cycles Dose modification:   (original dose 745.2 mg, Cycle 1),   (original dose 745.2 mg, Cycle 2) Administration: 750 mg (07/05/2019), 750 mg (07/26/2019) fosaprepitant (EMEND) 150 mg in sodium chloride 0.9 % 145 mL IVPB, 150 mg, Intravenous,  Once, 2 of 6 cycles Administration: 150 mg (07/05/2019), 150 mg (07/26/2019) PACLitaxel (TAXOL) 414 mg in sodium chloride 0.9 % 500 mL chemo infusion (> 12m/m2), 175 mg/m2 = 414 mg, Intravenous,  Once, 2 of 6 cycles Administration: 414 mg (07/05/2019), 414 mg (07/26/2019)  for chemotherapy treatment.       CANCER STAGING: Cancer Staging Breast cancer ([facility] Staging form: Breast, AJCC 7th Edition - Clinical stage from 02/01/2014: Stage 0 (Tis (DCIS), N0, M0) - Signed by KBaird Cancer PA-C on 12/01/2015  Endometrial cancer ([facility] Staging form: Corpus Uteri - Carcinoma and Carcinosarcoma, AJCC 8th Edition - Clinical stage from 05/18/2019: FIGO Stage IIIC1 (cT1b, cN1a(sn)) - Unsigned    INTERVAL HISTORY:  Ms. [id number]. female seen for follow-up and toxicity assessment prior to cycle 3 of chemotherapy.  Cycle 2 was on 07/26/2019.  Reports appetite 100%.  Energy levels are 50%.  Dizziness occasionally present.  Anxiety/depression is stable.  Did not have any major reactions after last paclitaxel.  She continued to have body pains particularly in the legs for about 7 to 10 days.  For the first 7 days she took tramadol twice daily.  She took tramadol as needed the second week.   REVIEW OF SYSTEMS:  Review of  Systems  Musculoskeletal: Positive for arthralgias.  Psychiatric/Behavioral: Positive for depression. The patient is nervous/anxious.   All other systems reviewed and are negative.    PAST MEDICAL/SURGICAL HISTORY:  Past Medical History:  Diagnosis Date  . Ankle pain   . Anxiety   . Arthritis   . Asthma    Seasonal asthma  .  Breast cancer ([place]) 2008   ILC of Left breast; ER+  . Breast cancer ([place]) 2015   IDC+DCIS of Left breast; ER/PR+, Her2-, Ki67 = 4%  . Depression   . Endometrial cancer ([place])   . GERD (gastroesophageal reflux disease)   . H/O hiatal hernia 2003  . Hepatitis    history in early 20's.  Marland Kitchen History of atrial fibrillation 2011  . History of blood transfusion   . History of hepatitis A    Age 69  . History of pneumonia   . Hyperactive gag reflex   . Hyperlipidemia   . Hypertension   . Knee pain   . Obesity, Class III, BMI 40-49.9 (morbid obesity) ([place])   . OSA on CPAP   . Pneumonia   . Port-A-Cath in place 06/28/2019  . Sciatica   . Vertigo    Past Surgical History:  Procedure Laterality Date  . BREAST LUMPECTOMY Left 1982,2008   x 2   . CHOLECYSTECTOMY N/A 12/31/2016   Procedure: LAPAROSCOPIC CHOLECYSTECTOMY WITH POSSIBLE INTRAOPERATIVE CHOLANGIOGRAM;  Surgeon: Rolm Bookbinder, MD;  Location: [facility];  Service: General;  Laterality: N/A;  . COLONOSCOPY    . EYE SURGERY     bil cataract surgery  . HIATAL HERNIA REPAIR  2003  . Lymph node removal  2008  . MASTECTOMY MODIFIED RADICAL Left 01/26/2014   Procedure: LEFT MODIFIED RADICAL MASTECTOMY;  Surgeon: Jamesetta So, MD;  Location: [facility];  Service: General;  Laterality: Left;  . PORT-A-CATH REMOVAL  2009 ish  . PORTA CATH INSERTION  [id number]  . PORTACATH PLACEMENT Right 06/23/2019   Procedure: INSERTION PORT-A-CATH;  Surgeon: Aviva Signs, MD;  Location: [facility];  Service: General;  Laterality: Right;  . ROBOTIC ASSISTED TOTAL HYSTERECTOMY WITH BILATERAL SALPINGO OOPHERECTOMY N/A 05/18/2019   Procedure: XI ROBOTIC ASSISTED TOTAL HYSTERECTOMY WITH BILATERAL SALPINGO OOPHORECTOMY;  Surgeon: Lafonda Mosses, MD;  Location: [facility];  Service: Gynecology;  Laterality: N/A;  . SENTINEL NODE BIOPSY N/A 05/18/2019   Procedure: SENTINEL LYMPH NODE BIOPSY;  Surgeon: Lafonda Mosses, MD;  Location: [facility];  Service: Gynecology;   Laterality: N/A;  . TOTAL KNEE ARTHROPLASTY Right 2006  . TOTAL KNEE ARTHROPLASTY  08/12/2011   Procedure: TOTAL KNEE ARTHROPLASTY;  Surgeon: Arther Abbott, MD;  Location: [facility];  Service: Orthopedics;  Laterality: Left;  . TUBAL LIGATION  1980     SOCIAL HISTORY:  Social History   Socioeconomic History  . Marital status: Widowed    Spouse name: Not on file  . Number of children: 2  . Years of education: 71  . Highest education level: Not on file  Occupational History  . Occupation: retired  Tobacco Use  . Smoking status: Former Smoker    Packs/day: 3.00    Years: 25.00    Pack years: 75.00    Types: Cigarettes    Quit date: 06/30/1997    Years since quitting: 22.1  . Smokeless tobacco: Never Used  Substance and Sexual Activity  . Alcohol use: Yes    Alcohol/week: 0.0 standard drinks    Comment: 2 glasses wine per month  . Drug use:  No  . Sexual activity: Not Currently    Birth control/protection: Surgical    Comment: tubal  Other Topics Concern  . Not on file  Social History Narrative   Drinks 2-3 cups of coffee a day    Social Determinants of Health   Financial Resource Strain:   . Difficulty of Paying Living Expenses: Not on file  Food Insecurity:   . Worried About Charity fundraiser in the Last Year: Not on file  . Ran Out of Food in the Last Year: Not on file  Transportation Needs:   . Lack of Transportation (Medical): Not on file  . Lack of Transportation (Non-Medical): Not on file  Physical Activity:   . Days of Exercise per Week: Not on file  . Minutes of Exercise per Session: Not on file  Stress:   . Feeling of Stress : Not on file  Social Connections:   . Frequency of Communication with Friends and Family: Not on file  . Frequency of Social Gatherings with Friends and Family: Not on file  . Attends Religious Services: Not on file  . Active Member of Clubs or Organizations: Not on file  . Attends Archivist Meetings: Not on file  .  Marital Status: Not on file  Intimate Partner Violence:   . Fear of Current or Ex-Partner: Not on file  . Emotionally Abused: Not on file  . Physically Abused: Not on file  . Sexually Abused: Not on file    FAMILY HISTORY:  Family History  Problem Relation Age of Onset  . Stroke Father   . Heart disease Father   . Skin cancer Sister        dx. 23-58; unknown type  . Throat cancer Maternal Uncle 75       smoker; voicebox removed  . Alzheimer's disease Paternal Grandmother   . Heart attack Paternal Grandfather   . Alzheimer's disease Maternal Aunt   . Heart disease Maternal Uncle   . Lung cancer Cousin        smoker  . Lung cancer Paternal Aunt        dx. mid-70s; worked at [employer]  . Heart attack Daughter        caused by thyroid issues  . Heart disease Other   . Arthritis Other   . Lung disease Other   . Cancer Other   . Kidney disease Other   . Colon cancer Other   . Ovarian cancer Other   . Endometrial cancer Other     CURRENT MEDICATIONS:  Outpatient Encounter Medications as of 08/16/2019  Medication Sig  . apixaban (ELIQUIS) 5 MG TABS tablet Take 1 tablet (5 mg total) by mouth 2 (two) times daily.  Marland Kitchen buPROPion (WELLBUTRIN XL) 300 MG 24 hr tablet Take 300 mg by mouth daily.  . Calcium Carbonate-Vit D-Min (CALCIUM 1200 PO) Take 1 tablet by mouth daily.   Marland Kitchen CARBOPLATIN IV Inject into the vein every 21 ( twenty-one) days.  . furosemide (LASIX) 40 MG tablet Take 1 tablet (40 mg total) by mouth 2 (two) times daily.  Marland Kitchen loratadine (CLARITIN) 10 MG tablet Take 10 mg by mouth daily.  . magnesium oxide (MAG-OX) 400 (241.3 Mg) MG tablet Take 1 tablet (400 mg total) by mouth 3 (three) times daily.  . metoprolol tartrate (LOPRESSOR) 25 MG tablet Take 1 tablet (25 mg total) by mouth 2 (two) times daily.  Marland Kitchen omeprazole (PRILOSEC) 20 MG capsule Take 20 mg by mouth 2 (  two) times daily.   Marland Kitchen PACLITAXEL IV Inject into the vein every 21 ( twenty-one) days.  . potassium chloride  SA (KLOR-CON M20) 20 MEQ tablet Take 2 tablets (40 mEq total) by mouth every morning. & 20 meq in the evening (Patient taking differently: Take 20 mEq by mouth 2 (two) times daily. )  . rosuvastatin (CRESTOR) 20 MG tablet Take 20 mg by mouth at bedtime.   . sertraline (ZOLOFT) 100 MG tablet Take 100 mg by mouth at bedtime.  Marland Kitchen albuterol (PROVENTIL) (2.5 MG/3ML) 0.083% nebulizer solution Take 3 mLs by nebulization 4 (four) times daily as needed for wheezing or shortness of breath.   Marland Kitchen albuterol (VENTOLIN HFA) 108 (90 Base) MCG/ACT inhaler Inhale 1-2 puffs into the lungs every 6 (six) hours as needed for wheezing or shortness of breath.  . lidocaine-prilocaine (EMLA) cream Apply a small amount to port a cath site and cover with plastic wrap 1 hour prior to chemotherapy appointments (Patient not taking: Reported on 08/16/2019)  . prochlorperazine (COMPAZINE) 10 MG tablet Take 1 tablet (10 mg total) by mouth every 6 (six) hours as needed (Nausea or vomiting). (Patient not taking: Reported on 08/05/2019)  . traMADol (ULTRAM) 50 MG tablet Take 1 tablet (50 mg total) by mouth every 6 (six) hours as needed for severe pain. (Patient not taking: Reported on 08/16/2019)   No facility-administered encounter medications on file as of 08/16/2019.    ALLERGIES:  No Known Allergies   PHYSICAL EXAM:  ECOG Performance status: 1  Vitals:   08/16/19 0930  BP: 126/67  Pulse: 100  Resp: 20  Temp: (!) 97.1 F (36.2 C)  SpO2: 98%   Filed Weights   08/16/19 0930  Weight: 258 lb 12.8 oz (117.4 kg)    Physical Exam Vitals reviewed.  Constitutional:      Appearance: Normal appearance.  Cardiovascular:     Rate and Rhythm: Normal rate and regular rhythm.     Heart sounds: Normal heart sounds.  Pulmonary:     Effort: Pulmonary effort is normal.     Breath sounds: Normal breath sounds.  Abdominal:     General: There is no distension.     Palpations: Abdomen is soft. There is no mass.  Musculoskeletal:         General: No swelling.  Skin:    General: Skin is warm.  Neurological:     General: No focal deficit present.     Mental Status: She is alert and oriented to person, place, and time.  Psychiatric:        Mood and Affect: Mood normal.        Behavior: Behavior normal.      LABORATORY DATA:  I have reviewed the labs as listed.  CBC    Component Value Date/Time   WBC 5.5 08/16/2019 0945   RBC 2.89 (L) 08/16/2019 0945   HGB 9.1 (L) 08/16/2019 0945   HCT 28.1 (L) 08/16/2019 0945   PLT 283 08/16/2019 0945   MCV 97.2 08/16/2019 0945   MCH 31.5 08/16/2019 0945   MCHC 32.4 08/16/2019 0945   RDW 18.4 (H) 08/16/2019 0945   LYMPHSABS 1.3 08/16/2019 0945   MONOABS 0.5 08/16/2019 0945   EOSABS 0.0 08/16/2019 0945   BASOSABS 0.0 08/16/2019 0945   CMP Latest Ref Rng & Units 08/11/2019 07/26/2019 07/13/2019  Glucose 70 - 99 mg/dL 139(H) 138(H) 160(H)  BUN 8 - 23 mg/dL 13 11 13   Creatinine 0.44 - 1.00 mg/dL 0.77 0.75 0.73  Sodium 135 - 145 mmol/L 137 140 138  Potassium 3.5 - 5.1 mmol/L 4.1 3.9 3.2(L)  Chloride 98 - 111 mmol/L 102 101 100  CO2 22 - 32 mmol/L 26 28 28   Calcium 8.9 - 10.3 mg/dL 8.9 9.0 8.2(L)  Total Protein 6.5 - 8.1 g/dL 7.0 6.8 6.6  Total Bilirubin 0.3 - 1.2 mg/dL 0.6 0.4 0.6  Alkaline Phos 38 - 126 U/L 92 77 73  AST 15 - 41 U/L 20 21 22   ALT 0 - 44 U/L 21 22 23        DIAGNOSTIC IMAGING:  I have independently reviewed the scans and discussed with the patient.     ASSESSMENT & PLAN:   Endometrial cancer ([place]) 1.  Stage III C1 (PT1BPN1A) endometrioid adenocarcinoma: -Debulking surgery on 05/18/2019. -CT CAP on 06/02/2019 did not show any evidence of metastatic disease. -2 cycles of carboplatin and paclitaxel on 07/05/2019 and 07/26/2019. -We reviewed CBC which is grossly stable.  LFTs are normal.  She will proceed with cycle 3 today. -She will receive Solu-Medrol 125 mg in the premedication along with Pepcid and Benadryl as she had reaction to Taxol during cycle  1. -She will be seen back in 3 weeks for follow-up.  2.  Severe body pains: -  3.  Hypomagnesemia: -She is taking magnesium 3 times a day.  4.  Hypokalemia: -She is taking potassium 20 mEq twice daily.  5.  Hypertension: -Blood pressure today is 126/67.  She will continue metoprolol 25 mg twice daily.     Orders placed this encounter:  No orders of the defined types were placed in this encounter.     Derek Jack, MD [place] [phone]

## 2019-08-16 NOTE — Progress Notes (Signed)
Labs reviewed with MD today. Magnesium 1.5. will give additional per orders. Ok to treat per MD.   Treatment given per orders. Patient tolerated it well without problems. Vitals stable and discharged home from clinic via wheelchair. Follow up as scheduled.

## 2019-08-16 NOTE — Patient Instructions (Signed)
[facility] at [facility] Discharge Instructions  You were seen today by Dr. Katragadda. He went over your recent lab results. He will see you back in 3 weeks for labs, treatment and follow up.   Thank you for choosing [facility] at [facility] to provide your oncology and hematology care.  To afford each patient quality time with our provider, please arrive at least 15 minutes before your scheduled appointment time.   If you have a lab appointment with the Cancer Center please come in thru the  Main Entrance and check in at the main information desk  You need to re-schedule your appointment should you arrive 10 or more minutes late.  We strive to give you quality time with our providers, and arriving late affects you and other patients whose appointments are after yours.  Also, if you no show three or more times for appointments you may be dismissed from the clinic at the providers discretion.     Again, thank you for choosing [facility].  Our hope is that these requests will decrease the amount of time that you wait before being seen by our physicians.       _____________________________________________________________  Should you have questions after your visit to [facility], please contact our office at [phone] between the hours of 8:00 a.m. and 4:30 p.m.  Voicemails left after 4:00 p.m. will not be returned until the following business day.  For prescription refill requests, have your pharmacy contact our office and allow 72 hours.    Cancer Center Support Programs:   > Cancer Support Group  2nd Tuesday of the month 1pm-2pm, Journey Room    

## 2019-08-18 ENCOUNTER — Other Ambulatory Visit: Payer: Self-pay

## 2019-08-18 ENCOUNTER — Inpatient Hospital Stay (HOSPITAL_COMMUNITY): Payer: Medicare Other

## 2019-08-18 VITALS — BP 117/57 | HR 76 | Temp 97.1°F | Resp 18

## 2019-08-18 DIAGNOSIS — Z95828 Presence of other vascular implants and grafts: Secondary | ICD-10-CM

## 2019-08-18 DIAGNOSIS — C50112 Malignant neoplasm of central portion of left female breast: Secondary | ICD-10-CM | POA: Diagnosis not present

## 2019-08-18 DIAGNOSIS — C541 Malignant neoplasm of endometrium: Secondary | ICD-10-CM

## 2019-08-18 DIAGNOSIS — Z5189 Encounter for other specified aftercare: Secondary | ICD-10-CM | POA: Diagnosis not present

## 2019-08-18 DIAGNOSIS — Z5111 Encounter for antineoplastic chemotherapy: Secondary | ICD-10-CM | POA: Diagnosis not present

## 2019-08-18 DIAGNOSIS — Z170 Estrogen receptor positive status [ER+]: Secondary | ICD-10-CM | POA: Diagnosis not present

## 2019-08-18 DIAGNOSIS — R52 Pain, unspecified: Secondary | ICD-10-CM | POA: Diagnosis not present

## 2019-08-18 MED ORDER — PEGFILGRASTIM-JMDB 6 MG/0.6ML ~~LOC~~ SOSY
PREFILLED_SYRINGE | SUBCUTANEOUS | Status: AC
  Filled 2019-08-18: qty 0.6

## 2019-08-18 MED ORDER — PEGFILGRASTIM-JMDB 6 MG/0.6ML ~~LOC~~ SOSY
6.0000 mg | PREFILLED_SYRINGE | Freq: Once | SUBCUTANEOUS | Status: AC
  Administered 2019-08-18: 6 mg via SUBCUTANEOUS

## 2019-08-18 NOTE — Patient Instructions (Signed)
[facility] at [facility]  Discharge Instructions:   _______________________________________________________________  Thank you for choosing [facility] at [facility] to provide your oncology and hematology care.  To afford each patient quality time with our providers, please arrive at least 15 minutes before your scheduled appointment.  You need to re-schedule your appointment if you arrive 10 or more minutes late.  We strive to give you quality time with our providers, and arriving late affects you and other patients whose appointments are after yours.  Also, if you no show three or more times for appointments you may be dismissed from the clinic.  Again, thank you for choosing [facility] at [facility]. Our hope is that these requests will allow you access to exceptional care and in a timely manner. _______________________________________________________________  If you have questions after your visit, please contact our office at [phone] between the hours of 8:30 a.m. and 5:00 p.m. Voicemails left after 4:30 p.m. will not be returned until the following business day. _______________________________________________________________  For prescription refill requests, have your pharmacy contact our office. _______________________________________________________________  Recommendations made by the consultant and any test results will be sent to your referring physician. _______________________________________________________________ 

## 2019-08-18 NOTE — Progress Notes (Signed)
Stephanie Jarvis presents today for injection per MD orders. Fulphila administered SQ in left Abdomen. Administration without incident. Patient tolerated well. Tolerated without adverse affects. Vital signs stable. No complaints at this time. Discharged from clinic via wheel chair.  F/U with [facility] as scheduled.

## 2019-08-25 DIAGNOSIS — K219 Gastro-esophageal reflux disease without esophagitis: Secondary | ICD-10-CM | POA: Diagnosis not present

## 2019-08-25 DIAGNOSIS — I499 Cardiac arrhythmia, unspecified: Secondary | ICD-10-CM | POA: Diagnosis not present

## 2019-08-26 DIAGNOSIS — Z23 Encounter for immunization: Secondary | ICD-10-CM | POA: Diagnosis not present

## 2019-09-01 ENCOUNTER — Encounter: Payer: Self-pay | Admitting: Licensed Clinical Social Worker

## 2019-09-03 ENCOUNTER — Other Ambulatory Visit: Payer: Self-pay | Admitting: Oncology

## 2019-09-03 DIAGNOSIS — C541 Malignant neoplasm of endometrium: Secondary | ICD-10-CM

## 2019-09-06 ENCOUNTER — Inpatient Hospital Stay (HOSPITAL_COMMUNITY): Payer: Medicare Other

## 2019-09-06 ENCOUNTER — Other Ambulatory Visit: Payer: Self-pay

## 2019-09-06 ENCOUNTER — Inpatient Hospital Stay (HOSPITAL_COMMUNITY): Payer: Medicare Other | Attending: Hematology | Admitting: Hematology

## 2019-09-06 VITALS — BP 112/60 | HR 73 | Temp 97.7°F | Resp 16

## 2019-09-06 DIAGNOSIS — Z818 Family history of other mental and behavioral disorders: Secondary | ICD-10-CM | POA: Insufficient documentation

## 2019-09-06 DIAGNOSIS — R200 Anesthesia of skin: Secondary | ICD-10-CM | POA: Insufficient documentation

## 2019-09-06 DIAGNOSIS — I10 Essential (primary) hypertension: Secondary | ICD-10-CM | POA: Diagnosis not present

## 2019-09-06 DIAGNOSIS — R110 Nausea: Secondary | ICD-10-CM | POA: Diagnosis not present

## 2019-09-06 DIAGNOSIS — Z853 Personal history of malignant neoplasm of breast: Secondary | ICD-10-CM | POA: Diagnosis not present

## 2019-09-06 DIAGNOSIS — Z87891 Personal history of nicotine dependence: Secondary | ICD-10-CM | POA: Insufficient documentation

## 2019-09-06 DIAGNOSIS — R0602 Shortness of breath: Secondary | ICD-10-CM | POA: Insufficient documentation

## 2019-09-06 DIAGNOSIS — Z8261 Family history of arthritis: Secondary | ICD-10-CM | POA: Diagnosis not present

## 2019-09-06 DIAGNOSIS — Z823 Family history of stroke: Secondary | ICD-10-CM | POA: Insufficient documentation

## 2019-09-06 DIAGNOSIS — Z90722 Acquired absence of ovaries, bilateral: Secondary | ICD-10-CM | POA: Insufficient documentation

## 2019-09-06 DIAGNOSIS — Z5111 Encounter for antineoplastic chemotherapy: Secondary | ICD-10-CM | POA: Insufficient documentation

## 2019-09-06 DIAGNOSIS — Z7901 Long term (current) use of anticoagulants: Secondary | ICD-10-CM | POA: Insufficient documentation

## 2019-09-06 DIAGNOSIS — Z8349 Family history of other endocrine, nutritional and metabolic diseases: Secondary | ICD-10-CM | POA: Diagnosis not present

## 2019-09-06 DIAGNOSIS — Z8041 Family history of malignant neoplasm of ovary: Secondary | ICD-10-CM | POA: Insufficient documentation

## 2019-09-06 DIAGNOSIS — Z800 Family history of malignant neoplasm of digestive organs: Secondary | ICD-10-CM | POA: Insufficient documentation

## 2019-09-06 DIAGNOSIS — F329 Major depressive disorder, single episode, unspecified: Secondary | ICD-10-CM | POA: Diagnosis not present

## 2019-09-06 DIAGNOSIS — R42 Dizziness and giddiness: Secondary | ICD-10-CM | POA: Insufficient documentation

## 2019-09-06 DIAGNOSIS — I219 Acute myocardial infarction, unspecified: Secondary | ICD-10-CM | POA: Insufficient documentation

## 2019-09-06 DIAGNOSIS — Z5189 Encounter for other specified aftercare: Secondary | ICD-10-CM | POA: Insufficient documentation

## 2019-09-06 DIAGNOSIS — E876 Hypokalemia: Secondary | ICD-10-CM | POA: Diagnosis not present

## 2019-09-06 DIAGNOSIS — D649 Anemia, unspecified: Secondary | ICD-10-CM | POA: Diagnosis not present

## 2019-09-06 DIAGNOSIS — Z809 Family history of malignant neoplasm, unspecified: Secondary | ICD-10-CM | POA: Insufficient documentation

## 2019-09-06 DIAGNOSIS — Z808 Family history of malignant neoplasm of other organs or systems: Secondary | ICD-10-CM | POA: Insufficient documentation

## 2019-09-06 DIAGNOSIS — Z841 Family history of disorders of kidney and ureter: Secondary | ICD-10-CM | POA: Insufficient documentation

## 2019-09-06 DIAGNOSIS — Z836 Family history of other diseases of the respiratory system: Secondary | ICD-10-CM | POA: Insufficient documentation

## 2019-09-06 DIAGNOSIS — Z801 Family history of malignant neoplasm of trachea, bronchus and lung: Secondary | ICD-10-CM | POA: Insufficient documentation

## 2019-09-06 DIAGNOSIS — C541 Malignant neoplasm of endometrium: Secondary | ICD-10-CM

## 2019-09-06 DIAGNOSIS — Z79899 Other long term (current) drug therapy: Secondary | ICD-10-CM | POA: Diagnosis not present

## 2019-09-06 DIAGNOSIS — Z8249 Family history of ischemic heart disease and other diseases of the circulatory system: Secondary | ICD-10-CM | POA: Diagnosis not present

## 2019-09-06 LAB — COMPREHENSIVE METABOLIC PANEL
ALT: 19 U/L (ref 0–44)
AST: 19 U/L (ref 15–41)
Albumin: 3.5 g/dL (ref 3.5–5.0)
Alkaline Phosphatase: 93 U/L (ref 38–126)
Anion gap: 9 (ref 5–15)
BUN: 17 mg/dL (ref 8–23)
CO2: 28 mmol/L (ref 22–32)
Calcium: 8.9 mg/dL (ref 8.9–10.3)
Chloride: 99 mmol/L (ref 98–111)
Creatinine, Ser: 0.8 mg/dL (ref 0.44–1.00)
GFR calc Af Amer: >60 mL/min (ref >60)
GFR calc non Af Amer: >60 mL/min (ref >60)
Glucose, Bld: 149 mg/dL — ABNORMAL HIGH (ref 70–99)
Potassium: 3.8 mmol/L (ref 3.5–5.1)
Sodium: 136 mmol/L (ref 135–145)
Total Bilirubin: 0.4 mg/dL (ref 0.3–1.2)
Total Protein: 7 g/dL (ref 6.5–8.1)

## 2019-09-06 LAB — CBC WITH DIFFERENTIAL/PLATELET
Abs Immature Granulocytes: 0.03 10*3/uL (ref 0.00–0.07)
Basophils Absolute: 0 10*3/uL (ref 0.0–0.1)
Basophils Relative: 1 %
Eosinophils Absolute: 0 10*3/uL (ref 0.0–0.5)
Eosinophils Relative: 0 %
HCT: 24.8 % — ABNORMAL LOW (ref 36.0–46.0)
Hemoglobin: 8 g/dL — ABNORMAL LOW (ref 12.0–15.0)
Immature Granulocytes: 1 %
Lymphocytes Relative: 29 %
Lymphs Abs: 1.8 10*3/uL (ref 0.7–4.0)
MCH: 33.2 pg (ref 26.0–34.0)
MCHC: 32.3 g/dL (ref 30.0–36.0)
MCV: 102.9 fL — ABNORMAL HIGH (ref 80.0–100.0)
Monocytes Absolute: 0.7 10*3/uL (ref 0.1–1.0)
Monocytes Relative: 11 %
Neutro Abs: 3.5 10*3/uL (ref 1.7–7.7)
Neutrophils Relative %: 58 %
Platelets: 392 10*3/uL (ref 150–400)
RBC: 2.41 MIL/uL — ABNORMAL LOW (ref 3.87–5.11)
RDW: 22 % — ABNORMAL HIGH (ref 11.5–15.5)
WBC: 6 10*3/uL (ref 4.0–10.5)
nRBC: 0.7 % — ABNORMAL HIGH (ref 0.0–0.2)

## 2019-09-06 LAB — MAGNESIUM: Magnesium: 1.5 mg/dL — ABNORMAL LOW (ref 1.7–2.4)

## 2019-09-06 LAB — PREPARE RBC (CROSSMATCH)

## 2019-09-06 MED ORDER — SODIUM CHLORIDE 0.9% FLUSH
10.0000 mL | INTRAVENOUS | Status: AC | PRN
  Administered 2019-09-06: 10 mL

## 2019-09-06 MED ORDER — HEPARIN SOD (PORK) LOCK FLUSH 100 UNIT/ML IV SOLN
500.0000 [IU] | Freq: Every day | INTRAVENOUS | Status: AC | PRN
  Administered 2019-09-06: 500 [IU]

## 2019-09-06 MED ORDER — ACETAMINOPHEN 325 MG PO TABS
650.0000 mg | ORAL_TABLET | Freq: Once | ORAL | Status: AC
  Administered 2019-09-06: 650 mg via ORAL
  Filled 2019-09-06: qty 2

## 2019-09-06 MED ORDER — DIPHENHYDRAMINE HCL 25 MG PO CAPS
25.0000 mg | ORAL_CAPSULE | Freq: Once | ORAL | Status: AC
  Administered 2019-09-06: 25 mg via ORAL
  Filled 2019-09-06: qty 1

## 2019-09-06 MED ORDER — SODIUM CHLORIDE 0.9% IV SOLUTION
250.0000 mL | Freq: Once | INTRAVENOUS | Status: AC
  Administered 2019-09-06: 250 mL via INTRAVENOUS

## 2019-09-06 MED ORDER — MAGNESIUM SULFATE 2 GM/50ML IV SOLN
2.0000 g | Freq: Once | INTRAVENOUS | Status: AC
  Administered 2019-09-06: 11:00:00 2 g via INTRAVENOUS
  Filled 2019-09-06: qty 50

## 2019-09-06 MED ORDER — SODIUM CHLORIDE 0.9 % IV SOLN: INTRAVENOUS | Status: DC

## 2019-09-06 NOTE — Assessment & Plan Note (Addendum)
1.  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 -CT CAP reviewed by me on 06/02/2019 did not show any evidence of metastatic disease. -3 cycles of carboplatin and paclitaxel from 07/05/2019 through 08/16/2019. -We reviewed her CBC today which shows white count of 6 and platelet count of 392. -She complained of severe weakness after last treatment.  She also felt dizzy at times. -We will hold off on her treatment today.  I will give her 1 unit of PRBC.  I will reevaluate her next week. -She is taking metoprolol 25 mg twice daily.  Blood pressure is [id number] systolic.  She is also taking Lasix 40 mg twice daily.  2.  Severe body pains: -She gets body pains for few days after each treatment.  She is taking tramadol 50 mg twice daily.  3.  Hypomagnesemia: -She is taking magnesium 2 tablets twice daily.  She is experiencing gas when she takes magnesium. -Magnesium today is 1.5.  She will receive IV magnesium.  I am reluctant to increase the dose at this time.  4.  Hypokalemia: -Potassium today is 3.8.  She will continue potassium 20 mEq twice daily.  5.  Hypertension: -She is on metoprolol 25 mg twice daily.  She is also on Lasix 40 mg twice daily. -She reported occasional lightheadedness.  Hopefully the PRBC transfusion will help with that.  Her systolic blood pressure is 116 today.  I have told her to cut back on metoprolol to half tablet twice daily.

## 2019-09-06 NOTE — Patient Instructions (Signed)
[facility] at [facility]  Discharge Instructions:   _______________________________________________________________  Thank you for choosing [facility] at [facility] to provide your oncology and hematology care.  To afford each patient quality time with our providers, please arrive at least 15 minutes before your scheduled appointment.  You need to re-schedule your appointment if you arrive 10 or more minutes late.  We strive to give you quality time with our providers, and arriving late affects you and other patients whose appointments are after yours.  Also, if you no show three or more times for appointments you may be dismissed from the clinic.  Again, thank you for choosing [facility] at [facility]. Our hope is that these requests will allow you access to exceptional care and in a timely manner. _______________________________________________________________  If you have questions after your visit, please contact our office at [phone] between the hours of 8:30 a.m. and 5:00 p.m. Voicemails left after 4:30 p.m. will not be returned until the following business day. _______________________________________________________________  For prescription refill requests, have your pharmacy contact our office. _______________________________________________________________  Recommendations made by the consultant and any test results will be sent to your referring physician. _______________________________________________________________ 

## 2019-09-06 NOTE — Patient Instructions (Addendum)
[facility] at [facility] Discharge Instructions  You were seen today by Dr. Delton Coombes. He went over your recent lab results. He will give you blood today. He will see you back in 1 week for labs, treatment and follow up.   Thank you for choosing [place] at [facility] to provide your oncology and hematology care.  To afford each patient quality time with our provider, please arrive at least 15 minutes before your scheduled appointment time.   If you have a lab appointment with the [facility] please come in thru the  Main Entrance and check in at the main information desk  You need to re-schedule your appointment should you arrive 10 or more minutes late.  We strive to give you quality time with our providers, and arriving late affects you and other patients whose appointments are after yours.  Also, if you no show three or more times for appointments you may be dismissed from the clinic at the providers discretion.     Again, thank you for choosing [facility].  Our hope is that these requests will decrease the amount of time that you wait before being seen by our physicians.       _____________________________________________________________  Should you have questions after your visit to [facility], please contact our office at [phone] between the hours of 8:00 a.m. and 4:30 p.m.  Voicemails left after 4:00 p.m. will not be returned until the following business day.  For prescription refill requests, have your pharmacy contact our office and allow 72 hours.    Cancer Center Support Programs:   > Cancer Support Group  2nd Tuesday of the month 1pm-2pm, Journey Room

## 2019-09-06 NOTE — Progress Notes (Signed)
Stephanie Jarvis, [place] [address]   CLINIC:  Medical Oncology/Hematology  PCP:  [name], MD [place] [address] [phone]   REASON FOR VISIT:  Follow-up for endometrioid adenocarcinoma.  CURRENT THERAPY: Carboplatin and paclitaxel.  BRIEF ONCOLOGIC HISTORY:  Oncology History  Breast cancer ([place])  12/08/2013 Procedure   Left needle core biopsy   12/09/2013 Pathology Results   Invasive ductal carcinoma with DCIS, ER 100%, PR 31%, Ki-67 marker 4%.  Insufficient material for HER2 testing.   12/28/2013 Breast MRI   Post biopsy changes located within the left breast laterally (upper-outer quadrant) related to the patient's recent stereotactic biopsy. Also, postsurgical scarring changes within the left breast. No areas of worrisome enhancement within the right breast.   01/26/2014 Definitive Surgery   Left modified radical mastectomy by Dr. Arnoldo Morale   02/01/2014 Pathology Results   Intermediate grade DCIS, 0.5 cm, 0/6 lymph nodes, negative resection margins.   03/13/2016 Imaging   Bone density- BMD as determined from Femur Neck Right is 1.023 g/cm2 with a T-Score of -0.1. This patient is considered normal according to [facility]) criteria.   Endometrial cancer ([place])  04/22/2019 Initial Biopsy   EMB - Gr1 EMCA   04/28/2019 Initial Diagnosis   Endometrial cancer determined by uterine biopsy ([place])   05/18/2019 Surgery   TRH/BSO, bilateral SLNs SLNs bulky.    Pathologic Stage   IIIC1, +LVSI, outer half MI, endometrioid adenoca, G 1-2 MSI-H   07/05/2019 -  Chemotherapy   The patient had palonosetron (ALOXI) injection 0.25 mg, 0.25 mg, Intravenous,  Once, 3 of 6 cycles Administration: 0.25 mg (07/05/2019), 0.25 mg (07/26/2019), 0.25 mg (08/16/2019) pegfilgrastim-jmdb (FULPHILA) injection 6 mg, 6 mg, Subcutaneous,  Once, 3 of 6 cycles Administration: 6 mg (07/07/2019), 6 mg (07/28/2019), 6 mg  (08/18/2019) CARBOplatin (PARAPLATIN) 750 mg in sodium chloride 0.9 % 250 mL chemo infusion, 750 mg (100 % of original dose 745.2 mg), Intravenous,  Once, 3 of 6 cycles Dose modification:   (original dose 745.2 mg, Cycle 1),   (original dose 745.2 mg, Cycle 2),   (original dose 745.2 mg, Cycle 3),   (original dose 745.2 mg, Cycle 4) Administration: 750 mg (07/05/2019), 750 mg (07/26/2019), 750 mg (08/16/2019) fosaprepitant (EMEND) 150 mg in sodium chloride 0.9 % 145 mL IVPB, 150 mg, Intravenous,  Once, 3 of 6 cycles Administration: 150 mg (07/05/2019), 150 mg (07/26/2019), 150 mg (08/16/2019) PACLitaxel (TAXOL) 414 mg in sodium chloride 0.9 % 500 mL chemo infusion (> 72m/m2), 175 mg/m2 = 414 mg, Intravenous,  Once, 3 of 6 cycles Administration: 414 mg (07/05/2019), 414 mg (07/26/2019), 414 mg (08/16/2019)  for chemotherapy treatment.       CANCER STAGING: Cancer Staging Breast cancer ([facility] Staging form: Breast, AJCC 7th Edition - Clinical stage from 02/01/2014: Stage 0 (Tis (DCIS), N0, M0) - Signed by KBaird Cancer PA-C on 12/01/2015  Endometrial cancer ([facility] Staging form: Corpus Uteri - Carcinoma and Carcinosarcoma, AJCC 8th Edition - Clinical stage from 05/18/2019: FIGO Stage IIIC1 (cT1b, cN1a(sn)) - Unsigned    INTERVAL HISTORY:  [name] female seen for follow-up and toxicity assessment prior to cycle 4 of chemotherapy.  Cycle 3 was on 08/16/2019.  She reported feeling very weak all 3 weeks after last treatment.  She is complaining of gas which is foul-smelling when she takes magnesium.  She also reported feeling dizzy occasionally.  Denies any tingling or numbness in extremities.  Reports appetite 50%.  Energy levels are very low.  Shortness of breath on exertion present.  She had mild nausea but denied any vomiting.   REVIEW OF SYSTEMS:  Review of Systems  Respiratory: Positive for shortness of breath.   Gastrointestinal: Positive for nausea.  Neurological: Positive for  dizziness.  Psychiatric/Behavioral: Positive for depression.  All other systems reviewed and are negative.    PAST MEDICAL/SURGICAL HISTORY:  Past Medical History:  Diagnosis Date  . Ankle pain   . Anxiety   . Arthritis   . Asthma    Seasonal asthma  . Breast cancer ([name]) 2008   ILC of Left breast; ER+  . Breast cancer ([name]) 2015   IDC+DCIS of Left breast; ER/PR+, Her2-, Ki67 = 4%  . Depression   . Endometrial cancer ([name])   . GERD (gastroesophageal reflux disease)   . H/O hiatal hernia 2003  . Hepatitis    history in early 20's.  Marland Kitchen History of atrial fibrillation 2011  . History of blood transfusion   . History of hepatitis A    Age 55  . History of pneumonia   . Hyperactive gag reflex   . Hyperlipidemia   . Hypertension   . Knee pain   . Obesity, Class III, BMI 40-49.9 (morbid obesity) ([place])   . OSA on CPAP   . Pneumonia   . Port-A-Cath in place 06/28/2019  . Sciatica   . Vertigo    Past Surgical History:  Procedure Laterality Date  . BREAST LUMPECTOMY Left 1982,2008   x 2   . CHOLECYSTECTOMY N/A 12/31/2016   Procedure: LAPAROSCOPIC CHOLECYSTECTOMY WITH POSSIBLE INTRAOPERATIVE CHOLANGIOGRAM;  Surgeon: Rolm Bookbinder, MD;  Location: [facility];  Service: General;  Laterality: N/A;  . COLONOSCOPY    . EYE SURGERY     bil cataract surgery  . HIATAL HERNIA REPAIR  2003  . Lymph node removal  2008  . MASTECTOMY MODIFIED RADICAL Left 01/26/2014   Procedure: LEFT MODIFIED RADICAL MASTECTOMY;  Surgeon: Jamesetta So, MD;  Location: [facility];  Service: General;  Laterality: Left;  . PORT-A-CATH REMOVAL  2009 ish  . PORTA CATH INSERTION  [id number]  . PORTACATH PLACEMENT Right 06/23/2019   Procedure: INSERTION PORT-A-CATH;  Surgeon: Aviva Signs, MD;  Location: [facility];  Service: General;  Laterality: Right;  . ROBOTIC ASSISTED TOTAL HYSTERECTOMY WITH BILATERAL SALPINGO OOPHERECTOMY N/A 05/18/2019   Procedure: XI ROBOTIC ASSISTED TOTAL HYSTERECTOMY WITH BILATERAL SALPINGO  OOPHORECTOMY;  Surgeon: Lafonda Mosses, MD;  Location: [facility];  Service: Gynecology;  Laterality: N/A;  . SENTINEL NODE BIOPSY N/A 05/18/2019   Procedure: SENTINEL LYMPH NODE BIOPSY;  Surgeon: Lafonda Mosses, MD;  Location: [facility];  Service: Gynecology;  Laterality: N/A;  . TOTAL KNEE ARTHROPLASTY Right 2006  . TOTAL KNEE ARTHROPLASTY  08/12/2011   Procedure: TOTAL KNEE ARTHROPLASTY;  Surgeon: Arther Abbott, MD;  Location: [facility];  Service: Orthopedics;  Laterality: Left;  . TUBAL LIGATION  1980     SOCIAL HISTORY:  Social History   Socioeconomic History  . Marital status: Widowed    Spouse name: Not on file  . Number of children: 2  . Years of education: 78  . Highest education level: Not on file  Occupational History  . Occupation: retired  Tobacco Use  . Smoking status: Former Smoker    Packs/day: 3.00    Years: 25.00    Pack years: 75.00    Types: Cigarettes    Quit date: 06/30/1997  Years since quitting: 22.2  . Smokeless tobacco: Never Used  Substance and Sexual Activity  . Alcohol use: Yes    Alcohol/week: 0.0 standard drinks    Comment: 2 glasses wine per month  . Drug use: No  . Sexual activity: Not Currently    Birth control/protection: Surgical    Comment: tubal  Other Topics Concern  . Not on file  Social History Narrative   Drinks 2-3 cups of coffee a day    Social Determinants of Health   Financial Resource Strain:   . Difficulty of Paying Living Expenses: Not on file  Food Insecurity:   . Worried About Charity fundraiser in the Last Year: Not on file  . Ran Out of Food in the Last Year: Not on file  Transportation Needs:   . Lack of Transportation (Medical): Not on file  . Lack of Transportation (Non-Medical): Not on file  Physical Activity:   . Days of Exercise per Week: Not on file  . Minutes of Exercise per Session: Not on file  Stress:   . Feeling of Stress : Not on file  Social Connections:   . Frequency of Communication  with Friends and Family: Not on file  . Frequency of Social Gatherings with Friends and Family: Not on file  . Attends Religious Services: Not on file  . Active Member of Clubs or Organizations: Not on file  . Attends Archivist Meetings: Not on file  . Marital Status: Not on file  Intimate Partner Violence:   . Fear of Current or Ex-Partner: Not on file  . Emotionally Abused: Not on file  . Physically Abused: Not on file  . Sexually Abused: Not on file    FAMILY HISTORY:  Family History  Problem Relation Age of Onset  . Stroke Father   . Heart disease Father   . Skin cancer Sister        dx. 76-58; unknown type  . Throat cancer Maternal Uncle 75       smoker; voicebox removed  . Alzheimer's disease Paternal Grandmother   . Heart attack Paternal Grandfather   . Alzheimer's disease Maternal Aunt   . Heart disease Maternal Uncle   . Lung cancer Cousin        smoker  . Lung cancer Paternal Aunt        dx. mid-70s; worked at [employer]  . Heart attack Daughter        caused by thyroid issues  . Heart disease Other   . Arthritis Other   . Lung disease Other   . Cancer Other   . Kidney disease Other   . Colon cancer Other   . Ovarian cancer Other   . Endometrial cancer Other     CURRENT MEDICATIONS:  Outpatient Encounter Medications as of 09/06/2019  Medication Sig  . apixaban (ELIQUIS) 5 MG TABS tablet Take 1 tablet (5 mg total) by mouth 2 (two) times daily.  Marland Kitchen buPROPion (WELLBUTRIN XL) 300 MG 24 hr tablet Take 300 mg by mouth daily.  . Calcium Carbonate-Vit D-Min (CALCIUM 1200 PO) Take 1 tablet by mouth daily.   Marland Kitchen CARBOPLATIN IV Inject into the vein every 21 ( twenty-one) days.  . furosemide (LASIX) 40 MG tablet Take 1 tablet (40 mg total) by mouth 2 (two) times daily.  Marland Kitchen lidocaine-prilocaine (EMLA) cream Apply a small amount to port a cath site and cover with plastic wrap 1 hour prior to chemotherapy appointments  . loratadine (  CLARITIN) 10 MG tablet Take  10 mg by mouth daily.  . magnesium oxide (MAG-OX) 400 (241.3 Mg) MG tablet Take 2 tablets (800 mg total) by mouth 2 (two) times daily.  . metoprolol tartrate (LOPRESSOR) 25 MG tablet Take 1 tablet (25 mg total) by mouth 2 (two) times daily.  Marland Kitchen omeprazole (PRILOSEC) 20 MG capsule Take 20 mg by mouth 2 (two) times daily.   Marland Kitchen PACLITAXEL IV Inject into the vein every 21 ( twenty-one) days.  . potassium chloride SA (KLOR-CON M20) 20 MEQ tablet Take 2 tablets (40 mEq total) by mouth every morning. & 20 meq in the evening (Patient taking differently: Take 20 mEq by mouth 2 (two) times daily. )  . rosuvastatin (CRESTOR) 20 MG tablet Take 20 mg by mouth at bedtime.   . sertraline (ZOLOFT) 100 MG tablet Take 100 mg by mouth at bedtime.  Marland Kitchen albuterol (PROVENTIL) (2.5 MG/3ML) 0.083% nebulizer solution Take 3 mLs by nebulization 4 (four) times daily as needed for wheezing or shortness of breath.   Marland Kitchen albuterol (VENTOLIN HFA) 108 (90 Base) MCG/ACT inhaler Inhale 1-2 puffs into the lungs every 6 (six) hours as needed for wheezing or shortness of breath.  . prochlorperazine (COMPAZINE) 10 MG tablet Take 1 tablet (10 mg total) by mouth every 6 (six) hours as needed (Nausea or vomiting). (Patient not taking: Reported on 09/06/2019)  . traMADol (ULTRAM) 50 MG tablet Take 1 tablet (50 mg total) by mouth every 6 (six) hours as needed for severe pain. (Patient not taking: Reported on 09/06/2019)   No facility-administered encounter medications on file as of 09/06/2019.    ALLERGIES:  No Known Allergies   PHYSICAL EXAM:  ECOG Performance status: 1  Vitals:   09/06/19 0845  BP: (!) 116/56  Pulse: 89  Resp: 18  Temp: (!) 96.2 F (35.7 C)  SpO2: 98%   Filed Weights   09/06/19 0845  Weight: 260 lb (117.9 kg)    Physical Exam Vitals reviewed.  Constitutional:      Appearance: Normal appearance.  Cardiovascular:     Rate and Rhythm: Normal rate and regular rhythm.     Heart sounds: Normal heart sounds.    Pulmonary:     Effort: Pulmonary effort is normal.     Breath sounds: Normal breath sounds.  Abdominal:     General: There is no distension.     Palpations: Abdomen is soft. There is no mass.  Musculoskeletal:        General: No swelling.  Skin:    General: Skin is warm.  Neurological:     General: No focal deficit present.     Mental Status: She is alert and oriented to person, place, and time.  Psychiatric:        Mood and Affect: Mood normal.        Behavior: Behavior normal.      LABORATORY DATA:  I have reviewed the labs as listed.  CBC    Component Value Date/Time   WBC 6.0 09/06/2019 0828   RBC 2.41 (L) 09/06/2019 0828   HGB 8.0 (L) 09/06/2019 0828   HCT 24.8 (L) 09/06/2019 0828   PLT 392 09/06/2019 0828   MCV 102.9 (H) 09/06/2019 0828   MCH 33.2 09/06/2019 0828   MCHC 32.3 09/06/2019 0828   RDW 22.0 (H) 09/06/2019 0828   LYMPHSABS 1.8 09/06/2019 0828   MONOABS 0.7 09/06/2019 0828   EOSABS 0.0 09/06/2019 0828   BASOSABS 0.0 09/06/2019 0828   CMP  Latest Ref Rng & Units 09/06/2019 08/16/2019 08/11/2019  Glucose 70 - 99 mg/dL 149(H) 183(H) 139(H)  BUN 8 - 23 mg/dL 17 12 13   Creatinine 0.44 - 1.00 mg/dL 0.80 0.82 0.77  Sodium 135 - 145 mmol/L 136 138 137  Potassium 3.5 - 5.1 mmol/L 3.8 3.5 4.1  Chloride 98 - 111 mmol/L 99 101 102  CO2 22 - 32 mmol/L 28 27 26   Calcium 8.9 - 10.3 mg/dL 8.9 8.9 8.9  Total Protein 6.5 - 8.1 g/dL 7.0 6.7 7.0  Total Bilirubin 0.3 - 1.2 mg/dL 0.4 0.6 0.6  Alkaline Phos 38 - 126 U/L 93 84 92  AST 15 - 41 U/L 19 18 20   ALT 0 - 44 U/L 19 19 21        DIAGNOSTIC IMAGING:  I have independently reviewed the scans.     ASSESSMENT & PLAN:   Endometrial cancer ([place]) 1.  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 -CT CAP reviewed by me on 06/02/2019 did not show any evidence of metastatic disease. -3 cycles of carboplatin and paclitaxel from 07/05/2019 through 08/16/2019. -We reviewed  her CBC today which shows white count of 6 and platelet count of 392. -She complained of severe weakness after last treatment.  She also felt dizzy at times. -We will hold off on her treatment today.  I will give her 1 unit of PRBC.  I will reevaluate her next week. -She is taking metoprolol 25 mg twice daily.  Blood pressure is 759 systolic.  She is also taking Lasix 40 mg twice daily.  2.  Severe body pains: -She gets body pains for few days after each treatment.  She is taking tramadol 50 mg twice daily.  3.  Hypomagnesemia: -She is taking magnesium 2 tablets twice daily.  She is experiencing gas when she takes magnesium. -Magnesium today is 1.5.  She will receive IV magnesium.  I am reluctant to increase the dose at this time.  4.  Hypokalemia: -Potassium today is 3.8.  She will continue potassium 20 mEq twice daily.  5.  Hypertension: -She is on metoprolol 25 mg twice daily.  She is also on Lasix 40 mg twice daily. -She reported occasional lightheadedness.  Hopefully the PRBC transfusion will help with that.  Her systolic blood pressure is 116 today.  I have told her to cut back on metoprolol to half tablet twice daily.     Orders placed this encounter:  No orders of the defined types were placed in this encounter.     Derek Jack, MD [place] [phone]

## 2019-09-06 NOTE — Progress Notes (Signed)
No treatment today per MD. Will given Magnesium and one unit of blood per MD today.    Patient tolerated it well without problems. Vitals stable and discharged home from clinic via wheelchair. Follow up as scheduled.

## 2019-09-06 NOTE — Progress Notes (Signed)
Patient has been assessed, vital signs and labs have been reviewed by Dr. Delton Coombes. Her magnesium is low today, please give 2 grams magnesium IV. Please give 1 unit PRBC. HOLD treatment per Dr. Delton Coombes.

## 2019-09-08 ENCOUNTER — Ambulatory Visit (HOSPITAL_COMMUNITY): Payer: Medicare Other

## 2019-09-08 LAB — BPAM RBC
Blood Product Expiration Date: 202104082359
ISSUE DATE / TIME: 202103081256
Unit Type and Rh: 5100

## 2019-09-08 LAB — TYPE AND SCREEN
ABO/RH(D): O POS
Antibody Screen: NEGATIVE
Unit division: 0

## 2019-09-09 ENCOUNTER — Encounter: Payer: Self-pay | Admitting: Radiation Oncology

## 2019-09-09 ENCOUNTER — Ambulatory Visit
Admission: RE | Admit: 2019-09-09 | Discharge: 2019-09-09 | Disposition: A | Discharge status: Home / Self Care | Payer: Medicare Other | Source: Ambulatory Visit | Attending: Radiation Oncology | Admitting: Radiation Oncology

## 2019-09-09 ENCOUNTER — Other Ambulatory Visit: Payer: Self-pay

## 2019-09-09 VITALS — BP 146/67 | HR 75 | Temp 98.2°F | Resp 18 | Ht 66.0 in

## 2019-09-09 DIAGNOSIS — E785 Hyperlipidemia, unspecified: Secondary | ICD-10-CM | POA: Insufficient documentation

## 2019-09-09 DIAGNOSIS — Z7901 Long term (current) use of anticoagulants: Secondary | ICD-10-CM | POA: Insufficient documentation

## 2019-09-09 DIAGNOSIS — Z79899 Other long term (current) drug therapy: Secondary | ICD-10-CM | POA: Insufficient documentation

## 2019-09-09 DIAGNOSIS — Z801 Family history of malignant neoplasm of trachea, bronchus and lung: Secondary | ICD-10-CM | POA: Insufficient documentation

## 2019-09-09 DIAGNOSIS — F418 Other specified anxiety disorders: Secondary | ICD-10-CM | POA: Insufficient documentation

## 2019-09-09 DIAGNOSIS — E669 Obesity, unspecified: Secondary | ICD-10-CM | POA: Diagnosis not present

## 2019-09-09 DIAGNOSIS — K219 Gastro-esophageal reflux disease without esophagitis: Secondary | ICD-10-CM | POA: Insufficient documentation

## 2019-09-09 DIAGNOSIS — Z87891 Personal history of nicotine dependence: Secondary | ICD-10-CM | POA: Diagnosis not present

## 2019-09-09 DIAGNOSIS — Z803 Family history of malignant neoplasm of breast: Secondary | ICD-10-CM | POA: Insufficient documentation

## 2019-09-09 DIAGNOSIS — Z923 Personal history of irradiation: Secondary | ICD-10-CM | POA: Insufficient documentation

## 2019-09-09 DIAGNOSIS — Z90711 Acquired absence of uterus with remaining cervical stump: Secondary | ICD-10-CM | POA: Diagnosis not present

## 2019-09-09 DIAGNOSIS — C541 Malignant neoplasm of endometrium: Secondary | ICD-10-CM | POA: Insufficient documentation

## 2019-09-09 DIAGNOSIS — Z800 Family history of malignant neoplasm of digestive organs: Secondary | ICD-10-CM | POA: Diagnosis not present

## 2019-09-09 DIAGNOSIS — I10 Essential (primary) hypertension: Secondary | ICD-10-CM | POA: Diagnosis not present

## 2019-09-09 DIAGNOSIS — Z9221 Personal history of antineoplastic chemotherapy: Secondary | ICD-10-CM | POA: Diagnosis not present

## 2019-09-09 DIAGNOSIS — I4891 Unspecified atrial fibrillation: Secondary | ICD-10-CM | POA: Insufficient documentation

## 2019-09-09 NOTE — Progress Notes (Signed)
Radiation Oncology         [phone] ________________________________  Initial Outpatient Consultation  Name: Stephanie Jarvis MRN: [id number]  Date: 09/09/2019  DOB: March 12, 1951  TG:YBWLS, Brandon Melnick, MD  Lafonda Mosses, MD   REFERRING PHYSICIAN: Lafonda Mosses, MD  DIAGNOSIS: The encounter diagnosis was Endometrial cancer [facility]).  Stage IIIC (pT1b, pN1a) Endometrioid Adenocarcinoma  HISTORY OF PRESENT ILLNESS::Stephanie Jarvis is a 69 y.o. female who is accompanied by no one due to COVID-19 restrictions. The patient presented to OBGYN with postmenopausal bleeding. She has a history of breast cancer diagnosed in 2008 and in 11/2013. In 2008, she was treated with lumpectomy, four cycles of taxotere/cytoxan, and 5 years of anastrozole. 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 She reports the bleeding started soon after she finished the anastrozole in 12/2018.  The patient underwent a biopsy on 04/22/2019 showing: endometrioid adenocarcinoma, FIGO grade 1. Pap smear performed the same day was negative for HPV.  She was referred to Dr. Berline Lopes on 05/03/2019 and hysterectomy was recommended. Performed on 05/18/2019, pathology from the hysterectomy revealed: endometrioid adenocarcinoma, FIGO grade 1/2, 5.5 cm; carcinoma focally invades outer half of myometrium; lymphovascular space invasion; margins not involved; lymph nodes positive for metastatic carcinoma (2/2). The recommendation was for chemotherapy followed by brachytherapy.  She underwent staging chest/abdomen/pelvis CT on 06/02/2019 showed no definite findings of metastatic disease, though there was a borderline right external iliac lymph node.  She was referred to Dr. Delton Coombes on 06/08/2019 and agreed to proceed with chemotherapy. She underwent port placement on 06/23/2019 and has received 3 cycles of chemotherapy consisting of carboplatin  and paclitaxel, starting 07/05/2019. Her fourth cycle was scheduled for 09/06/2019, but she reported severe weakness and dizziness from treatment. She will meet with Dr. Delton Coombes on 09/13/2019 to determine whether or not she will receive her final treatment.  She did undergo a transfusion was feeling much better.  PREVIOUS RADIATION THERAPY: Left breast as breast conservation therapy.  Radiation therapy was performed in [place]  PAST MEDICAL HISTORY:  Past Medical History:  Diagnosis Date  . Ankle pain   . Anxiety   . Arthritis   . Asthma    Seasonal asthma  . Breast cancer ([place]) 2008   ILC of Left breast; ER+  . Breast cancer ([place]) 2015   IDC+DCIS of Left breast; ER/PR+, Her2-, Ki67 = 4%  . Depression   . Endometrial cancer ([place])   . GERD (gastroesophageal reflux disease)   . H/O hiatal hernia 2003  . Hepatitis    history in early 20's.  Marland Kitchen History of atrial fibrillation 2011  . History of blood transfusion   . History of hepatitis A    Age 54  . History of pneumonia   . Hyperactive gag reflex   . Hyperlipidemia   . Hypertension   . Knee pain   . Obesity, Class III, BMI 40-49.9 (morbid obesity) ([place])   . OSA on CPAP   . Pneumonia   . Port-A-Cath in place 06/28/2019  . Sciatica   . Vertigo     PAST SURGICAL HISTORY: Past Surgical History:  Procedure Laterality Date  . BREAST LUMPECTOMY Left 1982,2008   x 2   . CHOLECYSTECTOMY N/A 12/31/2016   Procedure: LAPAROSCOPIC CHOLECYSTECTOMY WITH POSSIBLE INTRAOPERATIVE CHOLANGIOGRAM;  Surgeon: Rolm Bookbinder, MD;  Location: [facility];  Service: General;  Laterality: N/A;  . COLONOSCOPY    .  EYE SURGERY     bil cataract surgery  . HIATAL HERNIA REPAIR  2003  . Lymph node removal  2008  . MASTECTOMY MODIFIED RADICAL Left 01/26/2014   Procedure: LEFT MODIFIED RADICAL MASTECTOMY;  Surgeon: Jamesetta So, MD;  Location: [facility];  Service: General;  Laterality: Left;  . PORT-A-CATH REMOVAL  2009 ish  . PORTA CATH INSERTION  [id number]    . PORTACATH PLACEMENT Right 06/23/2019   Procedure: INSERTION PORT-A-CATH;  Surgeon: Aviva Signs, MD;  Location: [facility];  Service: General;  Laterality: Right;  . ROBOTIC ASSISTED TOTAL HYSTERECTOMY WITH BILATERAL SALPINGO OOPHERECTOMY N/A 05/18/2019   Procedure: XI ROBOTIC ASSISTED TOTAL HYSTERECTOMY WITH BILATERAL SALPINGO OOPHORECTOMY;  Surgeon: Lafonda Mosses, MD;  Location: [facility];  Service: Gynecology;  Laterality: N/A;  . SENTINEL NODE BIOPSY N/A 05/18/2019   Procedure: SENTINEL LYMPH NODE BIOPSY;  Surgeon: Lafonda Mosses, MD;  Location: [facility];  Service: Gynecology;  Laterality: N/A;  . TOTAL KNEE ARTHROPLASTY Right 2006  . TOTAL KNEE ARTHROPLASTY  08/12/2011   Procedure: TOTAL KNEE ARTHROPLASTY;  Surgeon: Arther Abbott, MD;  Location: [facility];  Service: Orthopedics;  Laterality: Left;  . TUBAL LIGATION  1980    FAMILY HISTORY:  Family History  Problem Relation Age of Onset  . Stroke Father   . Heart disease Father   . Skin cancer Sister        dx. 10-58; unknown type  . Throat cancer Maternal Uncle 75       smoker; voicebox removed  . Alzheimer's disease Paternal Grandmother   . Heart attack Paternal Grandfather   . Alzheimer's disease Maternal Aunt   . Heart disease Maternal Uncle   . Lung cancer Cousin        smoker  . Lung cancer Paternal Aunt        dx. mid-70s; worked at [employer]  . Heart attack Daughter        caused by thyroid issues  . Heart disease Other   . Arthritis Other   . Lung disease Other   . Cancer Other   . Kidney disease Other   . Colon cancer Other   . Ovarian cancer Other   . Endometrial cancer Other     SOCIAL HISTORY:  Social History   Tobacco Use  . Smoking status: Former Smoker    Packs/day: 3.00    Years: 25.00    Pack years: 75.00    Types: Cigarettes    Quit date: 06/30/1997    Years since quitting: 22.2  . Smokeless tobacco: Never Used  Substance Use Topics  . Alcohol use: Yes    Alcohol/week: 0.0  standard drinks    Comment: 2 glasses wine per month  . Drug use: No    ALLERGIES: No Known Allergies  MEDICATIONS:  Current Outpatient Medications  Medication Sig Dispense Refill  . albuterol (PROVENTIL) (2.5 MG/3ML) 0.083% nebulizer solution Take 3 mLs by nebulization 4 (four) times daily as needed for wheezing or shortness of breath.     Marland Kitchen albuterol (VENTOLIN HFA) 108 (90 Base) MCG/ACT inhaler Inhale 1-2 puffs into the lungs every 6 (six) hours as needed for wheezing or shortness of breath.    Marland Kitchen apixaban (ELIQUIS) 5 MG TABS tablet Take 1 tablet (5 mg total) by mouth 2 (two) times daily. 180 tablet 3  . buPROPion (WELLBUTRIN XL) 300 MG 24 hr tablet Take 300 mg by mouth daily.    . Calcium Carbonate-Vit D-Min (CALCIUM 1200 PO)  Take 1 tablet by mouth daily.     Marland Kitchen CARBOPLATIN IV Inject into the vein every 21 ( twenty-one) days.    . furosemide (LASIX) 40 MG tablet Take 1 tablet (40 mg total) by mouth 2 (two) times daily. 180 tablet 3  . lidocaine-prilocaine (EMLA) cream Apply a small amount to port a cath site and cover with plastic wrap 1 hour prior to chemotherapy appointments 30 g 3  . loratadine (CLARITIN) 10 MG tablet Take 10 mg by mouth daily.    . magnesium oxide (MAG-OX) 400 (241.3 Mg) MG tablet Take 2 tablets (800 mg total) by mouth 2 (two) times daily. 120 tablet 2  . metoprolol tartrate (LOPRESSOR) 25 MG tablet Take 1 tablet (25 mg total) by mouth 2 (two) times daily. 180 tablet 3  . omeprazole (PRILOSEC) 20 MG capsule Take 20 mg by mouth 2 (two) times daily.     Marland Kitchen PACLITAXEL IV Inject into the vein every 21 ( twenty-one) days.    . potassium chloride SA (KLOR-CON M20) 20 MEQ tablet Take 2 tablets (40 mEq total) by mouth every morning. & 20 meq in the evening (Patient taking differently: Take 20 mEq by mouth 2 (two) times daily. ) 180 tablet 3  . rosuvastatin (CRESTOR) 20 MG tablet Take 20 mg by mouth at bedtime.     . sertraline (ZOLOFT) 100 MG tablet Take 100 mg by mouth at  bedtime.    . prochlorperazine (COMPAZINE) 10 MG tablet Take 1 tablet (10 mg total) by mouth every 6 (six) hours as needed (Nausea or vomiting). (Patient not taking: Reported on 09/06/2019) 30 tablet 1  . traMADol (ULTRAM) 50 MG tablet Take 1 tablet (50 mg total) by mouth every 6 (six) hours as needed for severe pain. (Patient not taking: Reported on 09/06/2019) 20 tablet 0   No current facility-administered medications for this encounter.    REVIEW OF SYSTEMS:  REVIEW OF SYSTEMS: A 10+ POINT REVIEW OF SYSTEMS WAS OBTAINED including neurology, dermatology, psychiatry, cardiac, respiratory, lymph, extremities, GI, GU, musculoskeletal, constitutional, reproductive, HEENT. All pertinent positives are noted in the HPI. All others are negative.  Denies any vaginal bleeding or discharge.   PHYSICAL EXAM:  height is 5' 6"  (1.676 m). Her temporal temperature is 98.2 F (36.8 C). Her blood pressure is 146/67 (abnormal) and her pulse is 75. Her respiration is 18 and oxygen saturation is 98%.   General: Alert and oriented, in no acute distress HEENT: Head is normocephalic. Extraocular movements are intact. Oropharynx is clear. Neck: Neck is supple, no palpable cervical or supraclavicular lymphadenopathy. Heart: Regular in rate and rhythm with no murmurs, rubs, or gallops. Chest: Clear to auscultation bilaterally, with no rhonchi, wheezes, or rales. Abdomen: Soft, nontender, nondistended, with no rigidity or guarding.  Port sites healed well Extremities: No cyanosis or edema. Lymphatics: see Neck Exam Skin: No concerning lesions. Musculoskeletal: symmetric strength and muscle tone throughout. Neurologic: Cranial nerves II through XII are grossly intact. No obvious focalities. Speech is fluent. Coordination is intact. Psychiatric: Judgment and insight are intact. Affect is appropriate. Right breast: No palpable mass nipple discharge or bleeding.  Left chest wall shows a mastectomy scar without palpable or  visible signs of recurrence.  Radiation tattoos in place along the left chest region.   ECOG = 1  0 - Asymptomatic (Fully active, able to carry on all predisease activities without restriction)  1 - Symptomatic but completely ambulatory (Restricted in physically strenuous activity but ambulatory and able to carry  out work of a light or sedentary nature. For example, light housework, office work)  2 - Symptomatic, <50% in bed during the day (Ambulatory and capable of all self care but unable to carry out any work activities. Up and about more than 50% of waking hours)  3 - Symptomatic, >50% in bed, but not bedbound (Capable of only limited self-care, confined to bed or chair 50% or more of waking hours)  4 - Bedbound (Completely disabled. Cannot carry on any self-care. Totally confined to bed or chair)  5 - Death   Eustace Pen MM, Creech RH, Tormey DC, et al. [phone]). "Toxicity and response criteria of the [facility]". [place] Oncol. 5 (6): 649-55  LABORATORY DATA:  Lab Results  Component Value Date   WBC 6.0 09/06/2019   HGB 8.0 (L) 09/06/2019   HCT 24.8 (L) 09/06/2019   MCV 102.9 (H) 09/06/2019   PLT 392 09/06/2019   NEUTROABS 3.5 09/06/2019   Lab Results  Component Value Date   NA 136 09/06/2019   K 3.8 09/06/2019   CL 99 09/06/2019   CO2 28 09/06/2019   GLUCOSE 149 (H) 09/06/2019   CREATININE 0.80 09/06/2019   CALCIUM 8.9 09/06/2019      RADIOGRAPHY: ECHOCARDIOGRAM COMPLETE  Result Date: 08/11/2019    ECHOCARDIOGRAM REPORT   Patient Name:   Stephanie Jarvis Date of Exam: 08/11/2019 Medical Rec #:  [id number]         Height:       66.0 in Accession #:    [id number]        Weight:       258.0 lb Date of Birth:  05-06-1951         BSA:          2.23 m Patient Age:    2 years          BP:           153/70 mmHg Patient Gender: F                 HR:           80 bpm. Exam Location:  [facility] Procedure: 2D Echo, Cardiac Doppler and Color Doppler  Indications:    I31.3 (ICD-10-CM) - Pericardial effusion  History:        Patient has prior history of Echocardiogram examinations, most                 recent 02/25/2018. Arrythmias:Atrial Fibrillation; Risk                 Factors:Dyslipidemia and Hypertension. Breast cancer,Endometrial                 cancer,OSA on CPAP (From Hx).  Sonographer:    Alvino Chapel RCS Referring Phys: [place]  1. Left ventricular ejection fraction, by estimation, is 60 to 65%. The left ventricle has normal function. The left ventrical has no regional wall motion abnormalities. There is mildly increased left ventricular hypertrophy. Left ventricular diastolic parameters are indeterminate.  2. Right ventricular systolic function is normal. The right ventricular size is normal.  3. Left atrial size was moderately dilated.  4. Pericardial effusion is most prominent posterior to the LV, measuring 1.4 cm in diastole. . Moderate pericardial effusion. The pericardial effusion is circumferential. There is no evidence of cardiac tamponade.  5. The mitral valve is normal in structure and function. no evidence of mitral valve regurgitation. No  evidence of mitral stenosis.  6. The aortic valve is tricuspid. Aortic valve regurgitation is not visualized. No aortic stenosis is present.  7. The inferior vena cava is normal in size with greater than 50% respiratory variability, suggesting right atrial pressure of 3 mmHg. FINDINGS  Left Ventricle: Left ventricular ejection fraction, by estimation, is 60 to 65%. The left ventricle has normal function. The left ventricle has no regional wall motion abnormalities. There is mildly increased left ventricular hypertrophy. Left ventricular diastolic parameters are indeterminate. Right Ventricle: The right ventricular size is normal. No increase in right ventricular wall thickness. Right ventricular systolic function is normal. Left Atrium: Left atrial size was moderately dilated.  Right Atrium: Right atrial size was normal in size. Pericardium: Pericardial effusion is most prominent posterior to the LV, measuring 1.4 cm in diastole. A moderately sized pericardial effusion is present. The pericardial effusion is circumferential. There is no evidence of cardiac tamponade. Mitral Valve: The mitral valve is normal in structure and function. No evidence of mitral valve regurgitation. No evidence of mitral valve stenosis. Tricuspid Valve: The tricuspid valve is normal in structure. Tricuspid valve regurgitation is not demonstrated. No evidence of tricuspid stenosis. Aortic Valve: The aortic valve is tricuspid. Aortic valve regurgitation is not visualized. No aortic stenosis is present. Aortic valve mean gradient measures 7.0 mmHg. Aortic valve peak gradient measures 12.5 mmHg. Aortic valve area, by VTI measures 1.62  cm. Pulmonic Valve: The pulmonic valve was not well visualized. Pulmonic valve regurgitation is not visualized. No evidence of pulmonic stenosis. Aorta: The aortic root is normal in size and structure. Venous: The inferior vena cava is normal in size with greater than 50% respiratory variability, suggesting right atrial pressure of 3 mmHg. IAS/Shunts: No atrial level shunt detected by color flow Doppler.  LEFT VENTRICLE PLAX 2D LVIDd:         5.46 cm LVIDs:         3.49 cm LV PW:         1.04 cm LV IVS:        1.01 cm LVOT diam:     1.70 cm LV SV:         58.11 ml LV SV Index:   39.46 LVOT Area:     2.27 cm  LV Volumes (MOD) LV area d, A2C:    28.80 cm LV area d, A4C:    25.70 cm LV area s, A2C:    15.40 cm LV area s, A4C:    14.20 cm LV major d, A2C:   7.94 cm LV major d, A4C:   7.69 cm LV major s, A2C:   5.98 cm LV major s, A4C:   6.75 cm LV vol d, MOD A2C: 87.0 ml LV vol d, MOD A4C: 70.4 ml LV vol s, MOD A2C: 34.7 ml LV vol s, MOD A4C: 25.2 ml LV SV MOD A2C:     52.3 ml LV SV MOD A4C:     70.4 ml LV SV MOD BP:      48.1 ml RIGHT VENTRICLE RV S prime:     14.90 cm/s TAPSE  (M-mode): 2.6 cm LEFT ATRIUM             Index       RIGHT ATRIUM           Index LA diam:        3.40 cm 1.53 cm/m  RA Area:     17.10 cm LA Vol (A2C):   82.5 ml  37.02 ml/m RA Volume:   44.30 ml  19.88 ml/m LA Vol (A4C):   91.3 ml 40.97 ml/m LA Biplane Vol: 87.3 ml 39.18 ml/m  AORTIC VALVE AV Area (Vmax):    1.69 cm AV Area (Vmean):   1.63 cm AV Area (VTI):     1.62 cm AV Vmax:           177.00 cm/s AV Vmean:          119.000 cm/s AV VTI:            0.358 m AV Peak Grad:      12.5 mmHg AV Mean Grad:      7.0 mmHg LVOT Vmax:         132.00 cm/s LVOT Vmean:        85.700 cm/s LVOT VTI:          0.256 m LVOT/AV VTI ratio: 0.72  AORTA Ao Root diam: 3.00 cm MITRAL VALVE MV Area (PHT): 3.48 cm            SHUNTS MV Decel Time: 218 msec            Systemic VTI:  0.26 m MV E velocity: 98.10 cm/s 103 cm/s Systemic Diam: 1.70 cm Carlyle Dolly MD Electronically signed by Carlyle Dolly MD Signature Date/Time: 08/11/2019/1:59:51 PM    Final       IMPRESSION: Stage IIIC (pT1b, pN1a) Endometrioid Adenocarcinoma  Patient was found to have a deeply invasive tumor as well as lymphovascular space invasion.  Given these findings I feel she would be at risk for vaginal cuff recurrence and would agree with Dr. Charisse March recommendations for vaginal brachytherapy as part of the patient's adjuvant treatment.  Today, I talked to the patient  about the findings and work-up thus far.  We discussed the natural history of endometrial cancer and general treatment, highlighting the role of radiotherapy (brachytherapy) in the management.  We discussed the available radiation techniques, and focused on the details of logistics and delivery.  We reviewed the anticipated acute and late sequelae associated with radiation in this setting.  The patient was encouraged to ask questions that I answered to the best of my ability.  A patient consent form was discussed and signed.  We retained a copy for our records.  The patient would like  to proceed with radiation and will be scheduled for CT simulation.  PLAN: Patient will be scheduled for her vaginal brachytherapy in the next few weeks.  She would like to initiate this during her last few cycles of chemotherapy.  Anticipate 5 high-dose-rate treatments directed at the vaginal cuff.    ------------------------------------------------  Blair Promise, PhD, MD  This document serves as a record of services personally performed by Gery Pray, MD. It was created on his behalf by Wilburn Mylar, a trained medical scribe. The creation of this record is based on the scribe's personal observations and the provider's statements to them. This document has been checked and approved by the attending provider.

## 2019-09-09 NOTE — Patient Instructions (Signed)
Coronavirus (COVID-19) Are you at risk?  Are you at risk for the Coronavirus (COVID-19)?  To be considered HIGH RISK for Coronavirus (COVID-19), you have to meet the following criteria:  . Traveled to [place], [place], [place], [place] or [place]; or in the [place] to [place], [place], [place], or [place]; and have fever, cough, and shortness of breath within the last 2 weeks of travel OR . Been in close contact with a person diagnosed with COVID-19 within the last 2 weeks and have fever, cough, and shortness of breath . IF YOU DO NOT MEET THESE CRITERIA, YOU ARE CONSIDERED LOW RISK FOR COVID-19.  What to do if you are HIGH RISK for COVID-19?  . If you are having a medical emergency, call 911. . Seek medical care right away. Before you go to a doctor's office, urgent care or emergency department, call ahead and tell them about your recent travel, contact with someone diagnosed with COVID-19, and your symptoms. You should receive instructions from your physician's office regarding next steps of care.  . When you arrive at healthcare provider, tell the healthcare staff immediately you have returned from visiting [place], [place], [place], [place] or [place]; or traveled in the [place] to [place], [place], [place], or [place]; in the last two weeks or you have been in close contact with a person diagnosed with COVID-19 in the last 2 weeks.   . Tell the health care staff about your symptoms: fever, cough and shortness of breath. . After you have been seen by a medical provider, you will be either: o Tested for (COVID-19) and discharged home on quarantine except to seek medical care if symptoms worsen, and asked to  - Stay home and avoid contact with others until you get your results (4-5 days)  - Avoid travel on public transportation if possible (such as bus, train, or airplane) or o Sent to the Emergency Department by EMS for evaluation, COVID-19 testing, and possible  admission depending on your condition and test results.  What to do if you are LOW RISK for COVID-19?  Reduce your risk of any infection by using the same precautions used for avoiding the common cold or flu:  . Wash your hands often with soap and warm water for at least 20 seconds.  If soap and water are not readily available, use an alcohol-based hand sanitizer with at least 60% alcohol.  . If coughing or sneezing, cover your mouth and nose by coughing or sneezing into the elbow areas of your shirt or coat, into a tissue or into your sleeve (not your hands). . Avoid shaking hands with others and consider head nods or verbal greetings only. . Avoid touching your eyes, nose, or mouth with unwashed hands.  . Avoid close contact with people who are sick. . Avoid places or events with large numbers of people in one location, like concerts or sporting events. . Carefully consider travel plans you have or are making. . If you are planning any travel outside or inside the [place], visit the CDC's Travelers' Health webpage for the latest health notices. . If you have some symptoms but not all symptoms, continue to monitor at home and seek medical attention if your symptoms worsen. . If you are having a medical emergency, call 911.   ADDITIONAL HEALTHCARE OPTIONS FOR PATIENTS  [facility] / e-Visit: [email or web address]         [facility]: [phone]  [facility]: [phone]                   [facility]: [phone]   

## 2019-09-09 NOTE — Progress Notes (Signed)
GYN Location of Tumor / Histology: Stage IIIC1 endometrioid endometrial adenocarcinoma   Stephanie Jarvis presented with symptoms of: She began having spotting in August which progressively worsened since that time. She is on Eliquis 5mg  BID in the setting of atrial fibrillation which she started in November 2019. She had been on Arimidex (hx of breast cancer) until 12/2018 and began bleeding shortly after stopping.  Notes associated cramping and pain.  She denies any vaginal discharge.  She has had decreased appetite over the last several weeks with an unintentional 7 pound weight loss.  She notes some nausea with her pain, denies any emesis.  She denies any change to her bowel function.  After her gallbladder surgery, she now has bowel movements shortly after eating.  She denies any urinary symptoms.  She denies any fevers or chills.   Biopsies revealed: 05/18/19:FINAL MICROSCOPIC DIAGNOSIS:   A. LYMPH NODE, SENTINEL, RIGHT EXTERNAL ILIAC, BIOPSY:  - Metastatic adenocarcinoma in one lymph node (1/1).   B. LYMPH NODE, SENTINEL, LEFT EXTERNAL ILIAC, BIOPSY:  - Metastatic adenocarcinoma in one lymph node (1/1).   C. UTERUS, CERVIX, BILATERAL FALLOPIAN TUBES AND OVARIES, HYSTERECTOMY  WITH  SALPINGO-OOPHORECTOMY:  - Endometrioid adenocarcinoma, 5.5 cm.  - Carcinoma focally invades outer half of the myometrium.  - Lymphovascular space involvement by tumor.  - Margins not involved.  - Cervix, bilateral ovaries and bilateral fallopian tubes free of tumor.    Past/Anticipated interventions by Gyn/Onc surgery, if any: 05/18/19: Operation: Robotic-assisted laparoscopic total hysterectomy with bilateral salpingoophorectomy, SLN biopsy Modifier 22: Given BMI >40, obesity made retroperitoneal visualization limited and increased the complexity of the case and necessitated additional instrumentation for retraction. Obesity related complexity increased the duration of the procedure by 40 minutes. Surgeon:  Jeral Pinch  Past/Anticipated interventions by medical oncology, if any: Per Dr. Delton Coombes 09/06/19:  ASSESSMENT & PLAN:   Endometrial cancer ([place]) 1.  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 -CT CAP reviewed by me on 06/02/2019 did not show any evidence of metastatic disease. -3 cycles of carboplatin and paclitaxel from 07/05/2019 through 08/16/2019. -We reviewed her CBC today which shows white count of 6 and platelet count of 392. -She complained of severe weakness after last treatment.  She also felt dizzy at times. -We will hold off on her treatment today.  I will give her 1 unit of PRBC.  I will reevaluate her next week. -She is taking metoprolol 25 mg twice daily.  Blood pressure is 99991111 systolic.  She is also taking Lasix 40 mg twice daily.  2.  Severe body pains: -She gets body pains for few days after each treatment.  She is taking tramadol 50 mg twice daily.  3.  Hypomagnesemia: -She is taking magnesium 2 tablets twice daily.  She is experiencing gas when she takes magnesium. -Magnesium today is 1.5.  She will receive IV magnesium.  I am reluctant to increase the dose at this time.  4.  Hypokalemia: -Potassium today is 3.8.  She will continue potassium 20 mEq twice daily.  5.  Hypertension: -She is on metoprolol 25 mg twice daily.  She is also on Lasix 40 mg twice daily. -She reported occasional lightheadedness.  Hopefully the PRBC transfusion will help with that.  Her systolic blood pressure is 116 today.  I have told her to cut back on metoprolol to half tablet twice daily.  Weight changes, if any:  Wt Readings from Last 3 Encounters:  09/06/19 260  lb (117.9 kg)  08/16/19 258 lb 12.8 oz (117.4 kg)  08/05/19 258 lb (117 kg)    Bowel/Bladder complaints, if any: denies dysuria/hematuria. Pt denies vaginal bleeding/discharge. Pt denies rectal bleeding, diarrhea/constipation.  Nausea/Vomiting, if any: Pt reports  abdominal bloating. Pt reports frequent nausea without vomiting last week. Nausea has resolved. Bloating persists.   Pain issues, if any:  Pt denies c/o pain.  SAFETY ISSUES:  Prior radiation? YES, left breast 2008 in [place]. Had left mastectomy in 2015.  Pacemaker/ICD? no  Possible current pregnancy? no  Is the patient on methotrexate? no  Current Complaints / other details:  Pt presents today for initial consult with Dr. Sondra Come for [facility]. [facility] HDR handout given to pt.  BP (!) 146/67 (BP Location: Right Arm, Patient Position: Sitting)   Pulse 75   Temp 98.2 F (36.8 C) (Temporal)   Resp 18   Ht 5\' 6"  (1.676 m)   SpO2 98%   BMI 41.97 kg/m   Loma Sousa, RN BSN

## 2019-09-13 ENCOUNTER — Encounter (HOSPITAL_COMMUNITY): Payer: Self-pay | Admitting: Hematology

## 2019-09-13 ENCOUNTER — Inpatient Hospital Stay (HOSPITAL_COMMUNITY): Payer: Medicare Other

## 2019-09-13 ENCOUNTER — Other Ambulatory Visit: Payer: Self-pay

## 2019-09-13 ENCOUNTER — Inpatient Hospital Stay (HOSPITAL_BASED_OUTPATIENT_CLINIC_OR_DEPARTMENT_OTHER): Payer: Medicare Other | Admitting: Hematology

## 2019-09-13 VITALS — BP 140/69 | HR 97 | Temp 97.7°F | Resp 18

## 2019-09-13 DIAGNOSIS — C541 Malignant neoplasm of endometrium: Secondary | ICD-10-CM

## 2019-09-13 DIAGNOSIS — Z95828 Presence of other vascular implants and grafts: Secondary | ICD-10-CM

## 2019-09-13 DIAGNOSIS — E876 Hypokalemia: Secondary | ICD-10-CM | POA: Diagnosis not present

## 2019-09-13 DIAGNOSIS — Z5189 Encounter for other specified aftercare: Secondary | ICD-10-CM | POA: Diagnosis not present

## 2019-09-13 DIAGNOSIS — Z5111 Encounter for antineoplastic chemotherapy: Secondary | ICD-10-CM | POA: Diagnosis not present

## 2019-09-13 DIAGNOSIS — D649 Anemia, unspecified: Secondary | ICD-10-CM | POA: Diagnosis not present

## 2019-09-13 LAB — CBC WITH DIFFERENTIAL/PLATELET
Abs Immature Granulocytes: 0.01 10*3/uL (ref 0.00–0.07)
Basophils Absolute: 0 10*3/uL (ref 0.0–0.1)
Basophils Relative: 1 %
Eosinophils Absolute: 0.1 10*3/uL (ref 0.0–0.5)
Eosinophils Relative: 2 %
HCT: 32.2 % — ABNORMAL LOW (ref 36.0–46.0)
Hemoglobin: 10.1 g/dL — ABNORMAL LOW (ref 12.0–15.0)
Immature Granulocytes: 0 %
Lymphocytes Relative: 34 %
Lymphs Abs: 1.5 10*3/uL (ref 0.7–4.0)
MCH: 32 pg (ref 26.0–34.0)
MCHC: 31.4 g/dL (ref 30.0–36.0)
MCV: 101.9 fL — ABNORMAL HIGH (ref 80.0–100.0)
Monocytes Absolute: 0.6 10*3/uL (ref 0.1–1.0)
Monocytes Relative: 14 %
Neutro Abs: 2.3 10*3/uL (ref 1.7–7.7)
Neutrophils Relative %: 49 %
Platelets: 353 10*3/uL (ref 150–400)
RBC: 3.16 MIL/uL — ABNORMAL LOW (ref 3.87–5.11)
RDW: 20.1 % — ABNORMAL HIGH (ref 11.5–15.5)
WBC: 4.6 10*3/uL (ref 4.0–10.5)
nRBC: 0 % (ref 0.0–0.2)

## 2019-09-13 LAB — COMPREHENSIVE METABOLIC PANEL
ALT: 24 U/L (ref 0–44)
AST: 23 U/L (ref 15–41)
Albumin: 3.5 g/dL (ref 3.5–5.0)
Alkaline Phosphatase: 81 U/L (ref 38–126)
Anion gap: 11 (ref 5–15)
BUN: 12 mg/dL (ref 8–23)
CO2: 26 mmol/L (ref 22–32)
Calcium: 9 mg/dL (ref 8.9–10.3)
Chloride: 100 mmol/L (ref 98–111)
Creatinine, Ser: 0.79 mg/dL (ref 0.44–1.00)
GFR calc Af Amer: >60 mL/min (ref >60)
GFR calc non Af Amer: >60 mL/min (ref >60)
Glucose, Bld: 184 mg/dL — ABNORMAL HIGH (ref 70–99)
Potassium: 3.8 mmol/L (ref 3.5–5.1)
Sodium: 137 mmol/L (ref 135–145)
Total Bilirubin: 0.5 mg/dL (ref 0.3–1.2)
Total Protein: 6.8 g/dL (ref 6.5–8.1)

## 2019-09-13 LAB — MAGNESIUM: Magnesium: 1.7 mg/dL (ref 1.7–2.4)

## 2019-09-13 MED ORDER — PALONOSETRON HCL INJECTION 0.25 MG/5ML
0.2500 mg | Freq: Once | INTRAVENOUS | Status: AC
  Administered 2019-09-13: 0.25 mg via INTRAVENOUS

## 2019-09-13 MED ORDER — METHYLPREDNISOLONE SODIUM SUCC 125 MG IJ SOLR
125.0000 mg | Freq: Once | INTRAMUSCULAR | Status: AC
  Administered 2019-09-13: 125 mg via INTRAVENOUS

## 2019-09-13 MED ORDER — SODIUM CHLORIDE 0.9 % IV SOLN
150.0000 mg | Freq: Once | INTRAVENOUS | Status: AC
  Administered 2019-09-13: 150 mg via INTRAVENOUS
  Filled 2019-09-13: qty 150

## 2019-09-13 MED ORDER — SODIUM CHLORIDE 0.9 % IV SOLN
175.0000 mg/m2 | Freq: Once | INTRAVENOUS | Status: AC
  Administered 2019-09-13: 414 mg via INTRAVENOUS
  Filled 2019-09-13: qty 69

## 2019-09-13 MED ORDER — FAMOTIDINE IN NACL 20-0.9 MG/50ML-% IV SOLN
20.0000 mg | Freq: Once | INTRAVENOUS | Status: AC
  Administered 2019-09-13: 20 mg via INTRAVENOUS

## 2019-09-13 MED ORDER — FAMOTIDINE IN NACL 20-0.9 MG/50ML-% IV SOLN
INTRAVENOUS | Status: AC
  Filled 2019-09-13: qty 50

## 2019-09-13 MED ORDER — TRAMADOL HCL 50 MG PO TABS: 50.0000 mg | ORAL_TABLET | Freq: Four times a day (QID) | ORAL | 0 refills | Status: DC | PRN

## 2019-09-13 MED ORDER — SODIUM CHLORIDE 0.9 % IV SOLN
10.0000 mg | Freq: Once | INTRAVENOUS | Status: AC
  Administered 2019-09-13: 10 mg via INTRAVENOUS
  Filled 2019-09-13: qty 10

## 2019-09-13 MED ORDER — SODIUM CHLORIDE 0.9% FLUSH
10.0000 mL | INTRAVENOUS | Status: DC | PRN
  Administered 2019-09-13: 10 mL

## 2019-09-13 MED ORDER — DIPHENHYDRAMINE HCL 50 MG/ML IJ SOLN
50.0000 mg | Freq: Once | INTRAMUSCULAR | Status: AC
  Administered 2019-09-13: 50 mg via INTRAVENOUS

## 2019-09-13 MED ORDER — PALONOSETRON HCL INJECTION 0.25 MG/5ML
INTRAVENOUS | Status: AC
  Filled 2019-09-13: qty 5

## 2019-09-13 MED ORDER — DIPHENHYDRAMINE HCL 50 MG/ML IJ SOLN
INTRAMUSCULAR | Status: AC
  Filled 2019-09-13: qty 1

## 2019-09-13 MED ORDER — SODIUM CHLORIDE 0.9 % IV SOLN
745.2000 mg | Freq: Once | INTRAVENOUS | Status: AC
  Administered 2019-09-13: 750 mg via INTRAVENOUS
  Filled 2019-09-13: qty 75

## 2019-09-13 MED ORDER — SODIUM CHLORIDE 0.9 % IV SOLN: Freq: Once | INTRAVENOUS | Status: AC

## 2019-09-13 MED ORDER — METHYLPREDNISOLONE SODIUM SUCC 125 MG IJ SOLR
INTRAMUSCULAR | Status: AC
  Filled 2019-09-13: qty 2

## 2019-09-13 MED ORDER — HEPARIN SOD (PORK) LOCK FLUSH 100 UNIT/ML IV SOLN
500.0000 [IU] | Freq: Once | INTRAVENOUS | Status: AC | PRN
  Administered 2019-09-13: 500 [IU]

## 2019-09-13 NOTE — Patient Instructions (Addendum)
[facility] Discharge Instructions  You were seen today by Dr. Katragadda. He went over your recent lab results. He will see you back in 3 weeks for labs, treatment and follow up.   Thank you for choosing [facility] at [facility] to provide your oncology and hematology care.  To afford each patient quality time with our provider, please arrive at least 15 minutes before your scheduled appointment time.   If you have a lab appointment with the Cancer Center please come in thru the  Main Entrance and check in at the main information desk  You need to re-schedule your appointment should you arrive 10 or more minutes late.  We strive to give you quality time with our providers, and arriving late affects you and other patients whose appointments are after yours.  Also, if you no show three or more times for appointments you may be dismissed from the clinic at the providers discretion.     Again, thank you for choosing [facility].  Our hope is that these requests will decrease the amount of time that you wait before being seen by our physicians.       _____________________________________________________________  Should you have questions after your visit to [facility], please contact our office at [phone] between the hours of 8:00 a.m. and 4:30 p.m.  Voicemails left after 4:00 p.m. will not be returned until the following business day.  For prescription refill requests, have your pharmacy contact our office and allow 72 hours.    Cancer Center Support Programs:   > Cancer Support Group  2nd Tuesday of the month 1pm-2pm, Journey Room    

## 2019-09-13 NOTE — Progress Notes (Signed)
Patient presents today for treatment and follow up visit with Dr. Delton Coombes. Labs pending. Vital signs within parameters for treatment. Patient has no complaints of fatigue, shortness of breath or dizziness. Patient states she has been cooking at home and feels great since she received blood last week per patient's words.   Message received from [facility] LPN/ Dr. Delton Coombes to proceed with treatment. Labs reviewed and within parameters for treatment.   Treatment given today per MD orders. Tolerated infusion without adverse affects. Vital signs stable. No complaints at this time. Discharged from clinic via wheel chair. F/U with [facility] as scheduled.

## 2019-09-13 NOTE — Assessment & Plan Note (Signed)
1.  Stage III C1 (T1BN1A) endometrioid adenocarcinoma: -Debulking surgery on 05/18/2019. -CT CAP on 06/02/2019 did not show any evidence of metastatic disease. -3 cycles of carboplatin paclitaxel from 07/05/2019 through 08/16/2019. -We held her chemotherapy last week because of severe tiredness and anemia.  She did receive 1 unit of PRBC. -I reviewed her labs today.  Hemoglobin improved to 10.1.  White count and platelet count is normal.  Patient also feels very well. -She will proceed with cycle 4 without any dose modifications. -She met with Dr.Kinard for vaginal brachytherapy.  I will reevaluate her in 3 weeks.  2.  Severe body pains: -She gets severe body pains for few days after each treatment. -I have sent a prescription for tramadol 50 mg to be taken twice daily as needed.  3.  Hypomagnesemia: -Magnesium today is 1.7.  She will continue magnesium 2 tablets twice daily.  4.  Hypokalemia: -Potassium is 3.8.  She will continue potassium 20 mEq twice daily.  5.  Hypertension: -I have cut her metoprolol 25 mg to half tablet twice daily last week.  She is continuing Lasix 40 mg twice daily. -Today blood pressure is better at 114/61.  She denies any lightheadedness.

## 2019-09-13 NOTE — Progress Notes (Signed)
Stephanie Jarvis, [place] [address]   CLINIC:  Medical Oncology/Hematology  PCP:  Stephanie Fire, MD [place] [id number] [phone]   REASON FOR VISIT:  Follow-up for endometrioid adenocarcinoma.  CURRENT THERAPY: Carboplatin and paclitaxel.  BRIEF ONCOLOGIC HISTORY:  Oncology History  Breast cancer ([place])  12/08/2013 Procedure   Left needle core biopsy   12/09/2013 Pathology Results   Invasive ductal carcinoma with DCIS, ER 100%, PR 31%, Ki-67 marker 4%.  Insufficient material for HER2 testing.   12/28/2013 Breast MRI   Post biopsy changes located within the left breast laterally (upper-outer quadrant) related to the patient's recent stereotactic biopsy. Also, postsurgical scarring changes within the left breast. No areas of worrisome enhancement within the right breast.   01/26/2014 Definitive Surgery   Left modified radical mastectomy by Dr. Arnoldo Morale   02/01/2014 Pathology Results   Intermediate grade DCIS, 0.5 cm, 0/6 lymph nodes, negative resection margins.   03/13/2016 Imaging   Bone density- BMD as determined from Femur Neck Right is 1.023 g/cm2 with a T-Score of -0.1. This patient is considered normal according to [facility]) criteria.   Endometrial cancer ([place])  04/22/2019 Initial Biopsy   EMB - Gr1 EMCA   04/28/2019 Initial Diagnosis   Endometrial cancer determined by uterine biopsy ([place])   05/18/2019 Surgery   TRH/BSO, bilateral SLNs SLNs bulky.    Pathologic Stage   IIIC1, +LVSI, outer half MI, endometrioid adenoca, G 1-2 MSI-H   07/05/2019 -  Chemotherapy   The patient had palonosetron (ALOXI) injection 0.25 mg, 0.25 mg, Intravenous,  Once, 4 of 6 cycles Administration: 0.25 mg (07/05/2019), 0.25 mg (07/26/2019), 0.25 mg (08/16/2019), 0.25 mg (09/13/2019) pegfilgrastim-jmdb (FULPHILA) injection 6 mg, 6 mg, Subcutaneous,  Once, 4 of 6 cycles Administration: 6 mg (07/07/2019), 6 mg  (07/28/2019), 6 mg (08/18/2019) CARBOplatin (PARAPLATIN) 750 mg in sodium chloride 0.9 % 250 mL chemo infusion, 750 mg (100 % of original dose 745.2 mg), Intravenous,  Once, 4 of 6 cycles Dose modification:   (original dose 745.2 mg, Cycle 1),   (original dose 745.2 mg, Cycle 2),   (original dose 745.2 mg, Cycle 3),   (original dose 745.2 mg, Cycle 4) Administration: 750 mg (07/05/2019), 750 mg (07/26/2019), 750 mg (08/16/2019), 750 mg (09/13/2019) fosaprepitant (EMEND) 150 mg in sodium chloride 0.9 % 145 mL IVPB, 150 mg, Intravenous,  Once, 4 of 6 cycles Administration: 150 mg (07/05/2019), 150 mg (07/26/2019), 150 mg (08/16/2019), 150 mg (09/13/2019) PACLitaxel (TAXOL) 414 mg in sodium chloride 0.9 % 500 mL chemo infusion (> 49m/m2), 175 mg/m2 = 414 mg, Intravenous,  Once, 4 of 6 cycles Administration: 414 mg (07/05/2019), 414 mg (07/26/2019), 414 mg (08/16/2019), 414 mg (09/13/2019)  for chemotherapy treatment.       CANCER STAGING: Cancer Staging Breast cancer ([facility] Staging form: Breast, AJCC 7th Edition - Clinical stage from 02/01/2014: Stage 0 (Tis (DCIS), N0, M0) - Signed by KBaird Cancer PA-C on 12/01/2015  Endometrial cancer ([facility] Staging form: Corpus Uteri - Carcinoma and Carcinosarcoma, AJCC 8th Edition - Clinical stage from 05/18/2019: FIGO Stage IIIC1 (cT1b, cN1a(sn)) - Unsigned    INTERVAL HISTORY:  [name]. female seen for follow-up and toxicity assessment prior to cycle 4 of chemotherapy.  Cycle 3 was on 08/16/2019.  Last week we held her cycle 4 because of severe weakness and anemia.  She received 1 unit of PRBC.  She reports energy levels of 75%.  Appetite is 100%.  Numbness in the toes has been stable.  Denies any aches and pains at this time.  Denies any dizziness.  No nausea or vomiting reported.   REVIEW OF SYSTEMS:  Review of Systems  Neurological: Positive for numbness.  Psychiatric/Behavioral: Positive for depression. The patient is nervous/anxious.   All other  systems reviewed and are negative.    PAST MEDICAL/SURGICAL HISTORY:  Past Medical History:  Diagnosis Date  . Ankle pain   . Anxiety   . Arthritis   . Asthma    Seasonal asthma  . Breast cancer ([place]) 2008   ILC of Left breast; ER+  . Breast cancer ([place]) 2015   IDC+DCIS of Left breast; ER/PR+, Her2-, Ki67 = 4%  . Depression   . Endometrial cancer ([place])   . GERD (gastroesophageal reflux disease)   . H/O hiatal hernia 2003  . Hepatitis    history in early 20's.  Marland Kitchen History of atrial fibrillation 2011  . History of blood transfusion   . History of hepatitis A    Age 1  . History of pneumonia   . Hyperactive gag reflex   . Hyperlipidemia   . Hypertension   . Knee pain   . Obesity, Class III, BMI 40-49.9 (morbid obesity) ([place])   . OSA on CPAP   . Pneumonia   . Port-A-Cath in place 06/28/2019  . Sciatica   . Vertigo    Past Surgical History:  Procedure Laterality Date  . BREAST LUMPECTOMY Left 1982,2008   x 2   . CHOLECYSTECTOMY N/A 12/31/2016   Procedure: LAPAROSCOPIC CHOLECYSTECTOMY WITH POSSIBLE INTRAOPERATIVE CHOLANGIOGRAM;  Surgeon: Rolm Bookbinder, MD;  Location: [facility];  Service: General;  Laterality: N/A;  . COLONOSCOPY    . EYE SURGERY     bil cataract surgery  . HIATAL HERNIA REPAIR  2003  . Lymph node removal  2008  . MASTECTOMY MODIFIED RADICAL Left 01/26/2014   Procedure: LEFT MODIFIED RADICAL MASTECTOMY;  Surgeon: Jamesetta So, MD;  Location: [facility];  Service: General;  Laterality: Left;  . PORT-A-CATH REMOVAL  2009 ish  . PORTA CATH INSERTION  [id number]  . PORTACATH PLACEMENT Right 06/23/2019   Procedure: INSERTION PORT-A-CATH;  Surgeon: Aviva Signs, MD;  Location: [facility];  Service: General;  Laterality: Right;  . ROBOTIC ASSISTED TOTAL HYSTERECTOMY WITH BILATERAL SALPINGO OOPHERECTOMY N/A 05/18/2019   Procedure: XI ROBOTIC ASSISTED TOTAL HYSTERECTOMY WITH BILATERAL SALPINGO OOPHORECTOMY;  Surgeon: Lafonda Mosses, MD;  Location: [facility];  Service:  Gynecology;  Laterality: N/A;  . SENTINEL NODE BIOPSY N/A 05/18/2019   Procedure: SENTINEL LYMPH NODE BIOPSY;  Surgeon: Lafonda Mosses, MD;  Location: [facility];  Service: Gynecology;  Laterality: N/A;  . TOTAL KNEE ARTHROPLASTY Right 2006  . TOTAL KNEE ARTHROPLASTY  08/12/2011   Procedure: TOTAL KNEE ARTHROPLASTY;  Surgeon: Arther Abbott, MD;  Location: [facility];  Service: Orthopedics;  Laterality: Left;  . TUBAL LIGATION  1980     SOCIAL HISTORY:  Social History   Socioeconomic History  . Marital status: Widowed    Spouse name: Not on file  . Number of children: 2  . Years of education: 40  . Highest education level: Not on file  Occupational History  . Occupation: retired  Tobacco Use  . Smoking status: Former Smoker    Packs/day: 3.00    Years: 25.00    Pack years: 75.00    Types: Cigarettes    Quit date: 06/30/1997    Years since  quitting: 22.2  . Smokeless tobacco: Never Used  Substance and Sexual Activity  . Alcohol use: Yes    Alcohol/week: 0.0 standard drinks    Comment: 2 glasses wine per month  . Drug use: No  . Sexual activity: Not Currently    Birth control/protection: Surgical    Comment: tubal  Other Topics Concern  . Not on file  Social History Narrative   Drinks 2-3 cups of coffee a day    Social Determinants of Health   Financial Resource Strain:   . Difficulty of Paying Living Expenses:   Food Insecurity:   . Worried About Charity fundraiser in the Last Year:   . Arboriculturist in the Last Year:   Transportation Needs:   . Film/video editor (Medical):   Marland Kitchen Lack of Transportation (Non-Medical):   Physical Activity:   . Days of Exercise per Week:   . Minutes of Exercise per Session:   Stress:   . Feeling of Stress :   Social Connections:   . Frequency of Communication with Friends and Family:   . Frequency of Social Gatherings with Friends and Family:   . Attends Religious Services:   . Active Member of Clubs or Organizations:    . Attends Archivist Meetings:   Marland Kitchen Marital Status:   Intimate Partner Violence:   . Fear of Current or Ex-Partner:   . Emotionally Abused:   Marland Kitchen Physically Abused:   . Sexually Abused:     FAMILY HISTORY:  Family History  Problem Relation Age of Onset  . Stroke Father   . Heart disease Father   . Skin cancer Sister        dx. 55-58; unknown type  . Throat cancer Maternal Uncle 75       smoker; voicebox removed  . Alzheimer's disease Paternal Grandmother   . Heart attack Paternal Grandfather   . Alzheimer's disease Maternal Aunt   . Heart disease Maternal Uncle   . Lung cancer Cousin        smoker  . Lung cancer Paternal Aunt        dx. mid-70s; worked at [employer]  . Heart attack Daughter        caused by thyroid issues  . Heart disease Other   . Arthritis Other   . Lung disease Other   . Cancer Other   . Kidney disease Other   . Colon cancer Other   . Ovarian cancer Other   . Endometrial cancer Other     CURRENT MEDICATIONS:  Outpatient Encounter Medications as of 09/13/2019  Medication Sig  . apixaban (ELIQUIS) 5 MG TABS tablet Take 1 tablet (5 mg total) by mouth 2 (two) times daily.  Marland Kitchen buPROPion (WELLBUTRIN XL) 300 MG 24 hr tablet Take 300 mg by mouth daily.  . Calcium Carbonate-Vit D-Min (CALCIUM 1200 PO) Take 1 tablet by mouth daily.   Marland Kitchen CARBOPLATIN IV Inject into the vein every 21 ( twenty-one) days.  . furosemide (LASIX) 40 MG tablet Take 1 tablet (40 mg total) by mouth 2 (two) times daily.  Marland Kitchen loratadine (CLARITIN) 10 MG tablet Take 10 mg by mouth daily.  . magnesium oxide (MAG-OX) 400 (241.3 Mg) MG tablet Take 2 tablets (800 mg total) by mouth 2 (two) times daily.  . metoprolol tartrate (LOPRESSOR) 25 MG tablet Take 1 tablet (25 mg total) by mouth 2 (two) times daily.  Marland Kitchen omeprazole (PRILOSEC) 20 MG capsule Take 20 mg by  mouth 2 (two) times daily.   Marland Kitchen PACLITAXEL IV Inject into the vein every 21 ( twenty-one) days.  . potassium chloride SA  (KLOR-CON M20) 20 MEQ tablet Take 2 tablets (40 mEq total) by mouth every morning. & 20 meq in the evening (Patient taking differently: Take 20 mEq by mouth 2 (two) times daily. )  . rosuvastatin (CRESTOR) 20 MG tablet Take 20 mg by mouth at bedtime.   . sertraline (ZOLOFT) 100 MG tablet Take 100 mg by mouth at bedtime.  Marland Kitchen albuterol (PROVENTIL) (2.5 MG/3ML) 0.083% nebulizer solution Take 3 mLs by nebulization 4 (four) times daily as needed for wheezing or shortness of breath.   Marland Kitchen albuterol (VENTOLIN HFA) 108 (90 Base) MCG/ACT inhaler Inhale 1-2 puffs into the lungs every 6 (six) hours as needed for wheezing or shortness of breath.  . lidocaine-prilocaine (EMLA) cream Apply a small amount to port a cath site and cover with plastic wrap 1 hour prior to chemotherapy appointments (Patient not taking: Reported on 09/13/2019)  . prochlorperazine (COMPAZINE) 10 MG tablet Take 1 tablet (10 mg total) by mouth every 6 (six) hours as needed (Nausea or vomiting). (Patient not taking: Reported on 09/06/2019)  . traMADol (ULTRAM) 50 MG tablet Take 1 tablet (50 mg total) by mouth every 6 (six) hours as needed for severe pain.  . [DISCONTINUED] traMADol (ULTRAM) 50 MG tablet Take 1 tablet (50 mg total) by mouth every 6 (six) hours as needed for severe pain. (Patient not taking: Reported on 09/06/2019)   No facility-administered encounter medications on file as of 09/13/2019.    ALLERGIES:  No Known Allergies   PHYSICAL EXAM:  ECOG Performance status: 1  Vitals:   09/13/19 0842  BP: 114/61  Pulse: 81  Resp: 18  Temp: 97.6 F (36.4 C)  SpO2: 98%   Filed Weights   09/13/19 0842  Weight: 260 lb (117.9 kg)    Physical Exam Vitals reviewed.  Constitutional:      Appearance: Normal appearance.  Cardiovascular:     Rate and Rhythm: Normal rate and regular rhythm.     Heart sounds: Normal heart sounds.  Pulmonary:     Effort: Pulmonary effort is normal.     Breath sounds: Normal breath sounds.   Abdominal:     General: There is no distension.     Palpations: Abdomen is soft. There is no mass.  Musculoskeletal:        General: No swelling.  Skin:    General: Skin is warm.  Neurological:     General: No focal deficit present.     Mental Status: She is alert and oriented to person, place, and time.  Psychiatric:        Mood and Affect: Mood normal.        Behavior: Behavior normal.      LABORATORY DATA:  I have reviewed the labs as listed.  CBC    Component Value Date/Time   WBC 4.6 09/13/2019 0834   RBC 3.16 (L) 09/13/2019 0834   HGB 10.1 (L) 09/13/2019 0834   HCT 32.2 (L) 09/13/2019 0834   PLT 353 09/13/2019 0834   MCV 101.9 (H) 09/13/2019 0834   MCH 32.0 09/13/2019 0834   MCHC 31.4 09/13/2019 0834   RDW 20.1 (H) 09/13/2019 0834   LYMPHSABS 1.5 09/13/2019 0834   MONOABS 0.6 09/13/2019 0834   EOSABS 0.1 09/13/2019 0834   BASOSABS 0.0 09/13/2019 0834   CMP Latest Ref Rng & Units 09/13/2019 09/06/2019 08/16/2019  Glucose  70 - 99 mg/dL 184(H) 149(H) 183(H)  BUN 8 - 23 mg/dL 12 17 12   Creatinine 0.44 - 1.00 mg/dL 0.79 0.80 0.82  Sodium 135 - 145 mmol/L 137 136 138  Potassium 3.5 - 5.1 mmol/L 3.8 3.8 3.5  Chloride 98 - 111 mmol/L 100 99 101  CO2 22 - 32 mmol/L 26 28 27   Calcium 8.9 - 10.3 mg/dL 9.0 8.9 8.9  Total Protein 6.5 - 8.1 g/dL 6.8 7.0 6.7  Total Bilirubin 0.3 - 1.2 mg/dL 0.5 0.4 0.6  Alkaline Phos 38 - 126 U/L 81 93 84  AST 15 - 41 U/L 23 19 18   ALT 0 - 44 U/L 24 19 19        DIAGNOSTIC IMAGING:  I have independently reviewed the scans.     ASSESSMENT & PLAN:   Endometrial cancer ([place]) 1.  Stage III C1 (T1BN1A) endometrioid adenocarcinoma: -Debulking surgery on 05/18/2019. -CT CAP on 06/02/2019 did not show any evidence of metastatic disease. -3 cycles of carboplatin paclitaxel from 07/05/2019 through 08/16/2019. -We held her chemotherapy last week because of severe tiredness and anemia.  She did receive 1 unit of PRBC. -I reviewed her labs  today.  Hemoglobin improved to 10.1.  White count and platelet count is normal.  Patient also feels very well. -She will proceed with cycle 4 without any dose modifications. -She met with Dr.Kinard for vaginal brachytherapy.  I will reevaluate her in 3 weeks.  2.  Severe body pains: -She gets severe body pains for few days after each treatment. -I have sent a prescription for tramadol 50 mg to be taken twice daily as needed.  3.  Hypomagnesemia: -Magnesium today is 1.7.  She will continue magnesium 2 tablets twice daily.  4.  Hypokalemia: -Potassium is 3.8.  She will continue potassium 20 mEq twice daily.  5.  Hypertension: -I have cut her metoprolol 25 mg to half tablet twice daily last week.  She is continuing Lasix 40 mg twice daily. -Today blood pressure is better at 114/61.  She denies any lightheadedness.     Orders placed this encounter:  No orders of the defined types were placed in this encounter.     Derek Jack, MD [place] [phone]

## 2019-09-13 NOTE — Patient Instructions (Signed)
[facility] Discharge Instructions for Patients Receiving Chemotherapy  Today you received the following chemotherapy agents   To help prevent nausea and vomiting after your treatment, we encourage you to take your nausea medication   If you develop nausea and vomiting that is not controlled by your nausea medication, call the clinic.   BELOW ARE SYMPTOMS THAT SHOULD BE REPORTED IMMEDIATELY:  *FEVER GREATER THAN 100.5 F  *CHILLS WITH OR WITHOUT FEVER  NAUSEA AND VOMITING THAT IS NOT CONTROLLED WITH YOUR NAUSEA MEDICATION  *UNUSUAL SHORTNESS OF BREATH  *UNUSUAL BRUISING OR BLEEDING  TENDERNESS IN MOUTH AND THROAT WITH OR WITHOUT PRESENCE OF ULCERS  *URINARY PROBLEMS  *BOWEL PROBLEMS  UNUSUAL RASH Items with * indicate a potential emergency and should be followed up as soon as possible.  Feel free to call the clinic should you have any questions or concerns. The clinic phone number is [phone].  Please show the CHEMO ALERT CARD at check-in to the Emergency Department and triage nurse.   

## 2019-09-13 NOTE — Progress Notes (Signed)
Patient has been assessed, vital signs and labs have been reviewed by Dr. Katragadda. ANC, Creatinine, LFTs, and Platelets are within treatment parameters per Dr. Katragadda. The patient is good to proceed with treatment at this time.  

## 2019-09-14 ENCOUNTER — Telehealth: Payer: Self-pay | Admitting: *Deleted

## 2019-09-14 NOTE — Telephone Encounter (Signed)
CALLED PATIENT TO ASK QUESTIONS, SPOKE WITH PATIENT 

## 2019-09-15 ENCOUNTER — Inpatient Hospital Stay (HOSPITAL_COMMUNITY): Payer: Medicare Other

## 2019-09-15 ENCOUNTER — Telehealth: Payer: Self-pay | Admitting: *Deleted

## 2019-09-15 ENCOUNTER — Other Ambulatory Visit: Payer: Self-pay

## 2019-09-15 ENCOUNTER — Encounter (HOSPITAL_COMMUNITY): Payer: Self-pay

## 2019-09-15 VITALS — BP 108/55 | HR 95 | Temp 96.8°F | Resp 18

## 2019-09-15 DIAGNOSIS — Z95828 Presence of other vascular implants and grafts: Secondary | ICD-10-CM

## 2019-09-15 DIAGNOSIS — Z5189 Encounter for other specified aftercare: Secondary | ICD-10-CM | POA: Diagnosis not present

## 2019-09-15 DIAGNOSIS — Z5111 Encounter for antineoplastic chemotherapy: Secondary | ICD-10-CM | POA: Diagnosis not present

## 2019-09-15 DIAGNOSIS — D649 Anemia, unspecified: Secondary | ICD-10-CM | POA: Diagnosis not present

## 2019-09-15 DIAGNOSIS — C541 Malignant neoplasm of endometrium: Secondary | ICD-10-CM | POA: Diagnosis not present

## 2019-09-15 DIAGNOSIS — E876 Hypokalemia: Secondary | ICD-10-CM | POA: Diagnosis not present

## 2019-09-15 MED ORDER — PEGFILGRASTIM-JMDB 6 MG/0.6ML ~~LOC~~ SOSY
6.0000 mg | PREFILLED_SYRINGE | Freq: Once | SUBCUTANEOUS | Status: AC
  Administered 2019-09-15: 6 mg via SUBCUTANEOUS
  Filled 2019-09-15: qty 0.6

## 2019-09-15 NOTE — Progress Notes (Signed)
Patient tolerated injection with no complaints voiced.  Site clean and dry with no bruising or swelling noted at site.  Band aid applied.  Vss with discharge and left ambulatory with no s/s of distress noted.  

## 2019-09-15 NOTE — Telephone Encounter (Signed)
Called patient to inform of New HDR [facility], spoke with patient and she is aware of these appts. and is good with them

## 2019-09-18 ENCOUNTER — Other Ambulatory Visit: Payer: Self-pay | Admitting: Cardiology

## 2019-09-20 ENCOUNTER — Telehealth: Payer: Self-pay | Admitting: *Deleted

## 2019-09-20 NOTE — Telephone Encounter (Signed)
CALLED PATIENT TO REMIND OF NEW HDR [place] FOR 09/21/19, SPOKE WITH PATIENT AND SHE IS AWARE OF THESE APPTS.

## 2019-09-21 ENCOUNTER — Encounter: Payer: Self-pay | Admitting: Radiation Oncology

## 2019-09-21 ENCOUNTER — Ambulatory Visit
Admission: RE | Admit: 2019-09-21 | Discharge: 2019-09-21 | Disposition: A | Discharge status: Home / Self Care | Payer: Medicare Other | Source: Ambulatory Visit | Attending: Radiation Oncology | Admitting: Radiation Oncology

## 2019-09-21 ENCOUNTER — Other Ambulatory Visit: Payer: Self-pay

## 2019-09-21 VITALS — BP 140/68 | HR 83 | Temp 97.8°F | Resp 18 | Ht 66.0 in | Wt 260.0 lb

## 2019-09-21 DIAGNOSIS — Z79899 Other long term (current) drug therapy: Secondary | ICD-10-CM | POA: Insufficient documentation

## 2019-09-21 DIAGNOSIS — Z7901 Long term (current) use of anticoagulants: Secondary | ICD-10-CM | POA: Diagnosis not present

## 2019-09-21 DIAGNOSIS — C541 Malignant neoplasm of endometrium: Secondary | ICD-10-CM

## 2019-09-21 DIAGNOSIS — C569 Malignant neoplasm of unspecified ovary: Secondary | ICD-10-CM | POA: Diagnosis not present

## 2019-09-21 NOTE — Progress Notes (Signed)
  Radiation Oncology         [phone] ________________________________  Name: Stephanie Jarvis MRN: [id number]  Date: 09/21/2019  DOB: Mar 20, 1951  SIMULATION AND TREATMENT PLANNING NOTE HDR BRACHYTHERAPY  DIAGNOSIS: Stage IIIC (pT1b, pN1a) Endometrioid Adenocarcinoma  NARRATIVE:  The patient was brought to the [place] suite.  Identity was confirmed.  All relevant records and images related to the planned course of therapy were reviewed.  The patient freely provided informed written consent to proceed with treatment after reviewing the details related to the planned course of therapy. The consent form was witnessed and verified by the simulation staff.  Then, the patient was set-up in a stable reproducible supine position for radiation therapy.  CT images were obtained.  Surface markings were placed.  The CT images were loaded into the planning software.  Then the target and avoidance structures were contoured.  Treatment planning then occurred.  The radiation prescription was entered and confirmed.   I have requested : Brachytherapy Isodose Plan and Dosimetry Calculations to plan the radiation distribution.    PLAN:  The patient will receive 30 Gy in 5 fractions directed at the vaginal cuff.  The patient will be treated with a 3 cm diameter segmented cylinder with a treatment length of 3.5 cm.  Iridium 192 will be the high-dose-rate source.  Prescription will be to the mucosal surface.  ________________________________  Blair Promise, PhD, MD  This document serves as a record of services personally performed by Gery Pray, MD. It was created on his behalf by Clerance Lav, a trained medical scribe. The creation of this record is based on the scribe's personal observations and the provider's statements to them. This document has been checked and approved by the attending provider.

## 2019-09-21 NOTE — Patient Instructions (Signed)
Coronavirus (COVID-19) Are you at risk?  Are you at risk for the Coronavirus (COVID-19)?  To be considered HIGH RISK for Coronavirus (COVID-19), you have to meet the following criteria:  . Traveled to [place], [place], [place], [place] or [place]; or in the [place] to [place], [place], [place], or [place]; and have fever, cough, and shortness of breath within the last 2 weeks of travel OR . Been in close contact with a person diagnosed with COVID-19 within the last 2 weeks and have fever, cough, and shortness of breath . IF YOU DO NOT MEET THESE CRITERIA, YOU ARE CONSIDERED LOW RISK FOR COVID-19.  What to do if you are HIGH RISK for COVID-19?  . If you are having a medical emergency, call 911. . Seek medical care right away. Before you go to a doctor's office, urgent care or emergency department, call ahead and tell them about your recent travel, contact with someone diagnosed with COVID-19, and your symptoms. You should receive instructions from your physician's office regarding next steps of care.  . When you arrive at healthcare provider, tell the healthcare staff immediately you have returned from visiting [place], [place], [place], [place] or [place]; or traveled in the [place] to [place], [place], [place], or [place]; in the last two weeks or you have been in close contact with a person diagnosed with COVID-19 in the last 2 weeks.   . Tell the health care staff about your symptoms: fever, cough and shortness of breath. . After you have been seen by a medical provider, you will be either: o Tested for (COVID-19) and discharged home on quarantine except to seek medical care if symptoms worsen, and asked to  - Stay home and avoid contact with others until you get your results (4-5 days)  - Avoid travel on public transportation if possible (such as bus, train, or airplane) or o Sent to the Emergency Department by EMS for evaluation, COVID-19 testing, and possible  admission depending on your condition and test results.  What to do if you are LOW RISK for COVID-19?  Reduce your risk of any infection by using the same precautions used for avoiding the common cold or flu:  . Wash your hands often with soap and warm water for at least 20 seconds.  If soap and water are not readily available, use an alcohol-based hand sanitizer with at least 60% alcohol.  . If coughing or sneezing, cover your mouth and nose by coughing or sneezing into the elbow areas of your shirt or coat, into a tissue or into your sleeve (not your hands). . Avoid shaking hands with others and consider head nods or verbal greetings only. . Avoid touching your eyes, nose, or mouth with unwashed hands.  . Avoid close contact with people who are sick. . Avoid places or events with large numbers of people in one location, like concerts or sporting events. . Carefully consider travel plans you have or are making. . If you are planning any travel outside or inside the [place], visit the CDC's Travelers' Health webpage for the latest health notices. . If you have some symptoms but not all symptoms, continue to monitor at home and seek medical attention if your symptoms worsen. . If you are having a medical emergency, call 911.   ADDITIONAL HEALTHCARE OPTIONS FOR PATIENTS  [facility] / e-Visit: [email or web address]         [facility]: [phone]  [facility]: [phone]                   [facility]: [phone]   

## 2019-09-21 NOTE — Progress Notes (Signed)
Radiation Oncology         ([phone]) [phone] ________________________________  Vaginal Brachytherapy Procedure Note  Name: Stephanie Jarvis MRN: [id number]  Date: 09/21/2019  DOB: Dec 07, 1950    ICD-10-CM   1. Endometrial cancer (HCC)  C54.1     Diagnosis: Stage IIIC (pT1b, pN1a) Endometrioid Adenocarcinoma  Narrative: The patient returns today for vaginal cylinder fitting. She was seen in consultation on 09/09/2019. Given the findings of her deeply invasive tumor as well as lymphovascular space invasion, she was felt to be at risk for vaginal cuff recurrence. Thus, we opted to proceed with vaginal brachytherapy as part of her adjuvant treatment.  On review of systems, the patient occasional abdominal bloating attributed mainly to her diet and frequent nausea. The patient denies pain, dysuria, hematuria, vaginal bleeding/discharge, rectal bleeding, vomiting, diarrhea, and constipation.  Allergies:  has No Known Allergies.  Meds: Current Outpatient Medications  Medication Sig Dispense Refill  . albuterol (PROVENTIL) (2.5 MG/3ML) 0.083% nebulizer solution Take 3 mLs by nebulization 4 (four) times daily as needed for wheezing or shortness of breath.     Marland Kitchen albuterol (VENTOLIN HFA) 108 (90 Base) MCG/ACT inhaler Inhale 1-2 puffs into the lungs every 6 (six) hours as needed for wheezing or shortness of breath.    Marland Kitchen apixaban (ELIQUIS) 5 MG TABS tablet Take 1 tablet (5 mg total) by mouth 2 (two) times daily. 180 tablet 3  . buPROPion (WELLBUTRIN XL) 300 MG 24 hr tablet Take 300 mg by mouth daily.    . Calcium Carbonate-Vit D-Min (CALCIUM 1200 PO) Take 1 tablet by mouth daily.     Marland Kitchen CARBOPLATIN IV Inject into the vein every 21 ( twenty-one) days.    . furosemide (LASIX) 40 MG tablet TAKE 1 TABLET TWICE A DAY 180 tablet 1  . loratadine (CLARITIN) 10 MG tablet Take 10 mg by mouth daily.    . magnesium oxide (MAG-OX) 400 (241.3 Mg) MG tablet Take 2 tablets (800 mg total) by mouth 2 (two) times daily.  120 tablet 2  . metoprolol tartrate (LOPRESSOR) 25 MG tablet TAKE 1 TABLET TWICE A DAY 180 tablet 1  . omeprazole (PRILOSEC) 20 MG capsule Take 20 mg by mouth 2 (two) times daily.     Marland Kitchen PACLITAXEL IV Inject into the vein every 21 ( twenty-one) days.    . potassium chloride SA (KLOR-CON M20) 20 MEQ tablet Take 2 tablets (40 mEq total) by mouth every morning. & 20 meq in the evening (Patient taking differently: Take 20 mEq by mouth 2 (two) times daily. ) 180 tablet 3  . rosuvastatin (CRESTOR) 20 MG tablet Take 20 mg by mouth at bedtime.     . sertraline (ZOLOFT) 100 MG tablet Take 100 mg by mouth at bedtime.    . traMADol (ULTRAM) 50 MG tablet Take 1 tablet (50 mg total) by mouth every 6 (six) hours as needed for severe pain. 30 tablet 0  . lidocaine-prilocaine (EMLA) cream Apply a small amount to port a cath site and cover with plastic wrap 1 hour prior to chemotherapy appointments (Patient not taking: Reported on 09/13/2019) 30 g 3  . prochlorperazine (COMPAZINE) 10 MG tablet Take 1 tablet (10 mg total) by mouth every 6 (six) hours as needed (Nausea or vomiting). (Patient not taking: Reported on 09/06/2019) 30 tablet 1   No current facility-administered medications for this encounter.    Physical Findings:  The patient is in no acute distress. Patient is alert and oriented.  height is 5\' 6"  (  1.676 m) and weight is 260 lb (117.9 kg). Her temporal temperature is 97.8 F (36.6 C). Her blood pressure is 140/68 and her pulse is 83. Her respiration is 18 and oxygen saturation is 98%.  No significant changes. Lungs are clear to auscultation bilaterally. Heart has regular rate and rhythm. No palpable cervical, supraclavicular, or axillary adenopathy. Abdomen soft, non-tender, normal bowel sounds. On pelvic examination the external genitalia were unremarkable. A speculum exam was performed. There are no mucosal lesions noted in the vaginal vault. On bimanual  examination there were no pelvic masses  appreciated.  Vaginal cuff intact  Lab Findings: Lab Results  Component Value Date   WBC 4.6 09/13/2019   HGB 10.1 (L) 09/13/2019   HCT 32.2 (L) 09/13/2019   MCV 101.9 (H) 09/13/2019   PLT 353 09/13/2019    Radiographic Findings: No results found.  Impression: Stage IIIC (pT1b, pN1a) Endometrioid Adenocarcinoma  Patient is now ready to proceed with vaginal brachytherapy.  The optimal cylinder for the patient is a 3 cm diameter segmented cylinder with a treatment length of 3.5 cm.  This distended the vaginal vault without any undue discomfort.  Plan: The patient will proceed with CT simulation and vaginal brachytherapy today.  -----------------------------------  Blair Promise, PhD, MD  This document serves as a record of services personally performed by Gery Pray, MD. It was created on his behalf by Clerance Lav, a trained medical scribe. The creation of this record is based on the scribe's personal observations and the provider's statements to them. This document has been checked and approved by the attending provider.

## 2019-09-21 NOTE — Progress Notes (Signed)
  Radiation Oncology         (336) [phone] ________________________________  Name: Stephanie Jarvis MRN: [id number]  Date: 09/21/2019  DOB: [birth year]  CC: [name], MD  [name], MD  HDR BRACHYTHERAPY NOTE  DIAGNOSIS: Stage IIIC (pT1b, pN1a) Endometrioid Adenocarcinoma   Simple treatment device note: Patient had construction of her custom vaginal cylinder. She will be treated with a 3.0 cm diameter segmented cylinder. This conforms to her anatomy without undue discomfort.  Vaginal brachytherapy procedure node: The patient was brought to the [place] suite. Identity was confirmed. All relevant records and images related to the planned course of therapy were reviewed. The patient freely provided informed written consent to proceed with treatment after reviewing the details related to the planned course of therapy. The consent form was witnessed and verified by the simulation staff. Then, the patient was set-up in a stable reproducible supine position for radiation therapy. Pelvic exam revealed the vaginal cuff to be intact . The patient's custom vaginal cylinder was placed in the proximal vagina. This was affixed to the CT/MR stabilization plate to prevent slippage. Patient tolerated the placement well.  Verification simulation note:  A fiducial marker was placed within the vaginal cylinder. An AP and lateral film was then obtained through the pelvis area. This documented accurate position of the vaginal cylinder for treatment.  HDR BRACHYTHERAPY TREATMENT  The remote afterloading device was affixed to the vaginal cylinder by catheter. Patient then proceeded to undergo her first high-dose-rate treatment directed at the proximal vagina. The patient was prescribed a dose of 6.0 gray to be delivered to the mucosal surface. Treatment length was 3.5 cm. Patient was treated with 1 channel using 8 dwell positions. Treatment time was 358.4 seconds. Iridium 192 was the high-dose-rate source for  treatment. The patient tolerated the treatment well. After completion of her therapy, a radiation survey was performed documenting return of the iridium source into the GammaMed safe.   PLAN: The patient will return next week for her second high-dose-rate treatment. ________________________________   Blair Promise, PhD, MD  This document serves as a record of services personally performed by Gery Pray, MD. It was created on his behalf by Clerance Lav, a trained medical scribe. The creation of this record is based on the scribe's personal observations and the provider's statements to them. This document has been checked and approved by the attending provider.

## 2019-09-21 NOTE — Progress Notes (Signed)
Ms. Silver presents today for new [facility] HDR with Dr. Sondra Come. Pt denies c/o pain. Pt denies dysuria/hematuria. Pt denies vaginal bleeding/discharge. Pt denies rectal bleeding, diarrhea/constipation. Pt reports occasional abdominal bloating, attributed mainly to diet. Pt reports frequent nausea without vomiting and reports Dr. Delton Coombes gave anti-emetic that relieves.  BP 140/68 (BP Location: Right Arm, Patient Position: Sitting)   Pulse 83   Temp 97.8 F (36.6 C) (Temporal)   Resp 18   Ht 5\' 6"  (1.676 m)   Wt 260 lb (117.9 kg)   SpO2 98%   BMI 41.97 kg/m   Wt Readings from Last 3 Encounters:  09/21/19 260 lb (117.9 kg)  09/13/19 260 lb (117.9 kg)  09/06/19 260 lb (117.9 kg)   Loma Sousa, RN BSN

## 2019-09-24 DIAGNOSIS — Z23 Encounter for immunization: Secondary | ICD-10-CM | POA: Diagnosis not present

## 2019-09-27 ENCOUNTER — Other Ambulatory Visit (HOSPITAL_COMMUNITY): Payer: Self-pay | Admitting: *Deleted

## 2019-09-27 DIAGNOSIS — K219 Gastro-esophageal reflux disease without esophagitis: Secondary | ICD-10-CM | POA: Diagnosis not present

## 2019-09-27 DIAGNOSIS — C541 Malignant neoplasm of endometrium: Secondary | ICD-10-CM

## 2019-09-27 DIAGNOSIS — E785 Hyperlipidemia, unspecified: Secondary | ICD-10-CM | POA: Diagnosis not present

## 2019-09-28 NOTE — Progress Notes (Signed)
.  Pharmacist Chemotherapy Monitoring - Follow Up Assessment    I verify that I have reviewed each item in the below checklist:  . Regimen for the patient is scheduled for the appropriate day and plan matches scheduled date. Marland Kitchen Appropriate non-routine labs are ordered dependent on drug ordered. . If applicable, additional medications reviewed and ordered per protocol based on lifetime cumulative doses and/or treatment regimen.   Plan for follow-up and/or issues identified: No . I-vent associated with next due treatment: No . MD and/or nursing notified: No  Stephanie Jarvis 09/28/2019 2:25 PM

## 2019-09-29 ENCOUNTER — Telehealth: Payer: Self-pay | Admitting: *Deleted

## 2019-09-29 NOTE — Telephone Encounter (Signed)
CALLED PATIENT TO REMIND OF HDR [place] 09-30-19 @ 1 PM, SPOKE WITH PATIENT AND SHE IS AWARE OF THIS [facility].

## 2019-09-30 ENCOUNTER — Other Ambulatory Visit: Payer: Self-pay

## 2019-09-30 ENCOUNTER — Ambulatory Visit
Admission: RE | Admit: 2019-09-30 | Discharge: 2019-09-30 | Disposition: A | Discharge status: Home / Self Care | Payer: Medicare Other | Source: Ambulatory Visit | Attending: Radiation Oncology | Admitting: Radiation Oncology

## 2019-09-30 DIAGNOSIS — C541 Malignant neoplasm of endometrium: Secondary | ICD-10-CM | POA: Diagnosis not present

## 2019-09-30 DIAGNOSIS — C569 Malignant neoplasm of unspecified ovary: Secondary | ICD-10-CM | POA: Diagnosis not present

## 2019-09-30 NOTE — Progress Notes (Signed)
  Radiation Oncology         [phone] ________________________________  Name: LAFAWN AGUILLAR MRN: [id number]  Date: 09/30/2019  DOB: Nov 25, 1950  CC: [name], MD  [name], MD  HDR BRACHYTHERAPY NOTE  DIAGNOSIS: Stage IIIC (pT1b, pN1a) Endometrioid Adenocarcinoma   Simple treatment device note: Patient had construction of her custom vaginal cylinder. She will be treated with a 3.0 cm diameter segmented cylinder. This conforms to her anatomy without undue discomfort.  Vaginal brachytherapy procedure node: The patient was brought to the [place] suite. Identity was confirmed. All relevant records and images related to the planned course of therapy were reviewed. The patient freely provided informed written consent to proceed with treatment after reviewing the details related to the planned course of therapy. The consent form was witnessed and verified by the simulation staff. Then, the patient was set-up in a stable reproducible supine position for radiation therapy. Pelvic exam revealed the vaginal cuff to be intact . The patient's custom vaginal cylinder was placed in the proximal vagina. This was affixed to the CT/MR stabilization plate to prevent slippage. Patient tolerated the placement well.  Verification simulation note:  A fiducial marker was placed within the vaginal cylinder. An AP and lateral film was then obtained through the pelvis area. This documented accurate position of the vaginal cylinder for treatment.  HDR BRACHYTHERAPY TREATMENT  The remote afterloading device was affixed to the vaginal cylinder by catheter. Patient then proceeded to undergo her second high-dose-rate treatment directed at the proximal vagina. The patient was prescribed a dose of 6.0 gray to be delivered to the mucosal surface. Treatment length was 3.5 cm. Patient was treated with 1 channel using 8 dwell positions. Treatment time was 390.0 seconds. Iridium 192 was the high-dose-rate source for  treatment. The patient tolerated the treatment well. After completion of her therapy, a radiation survey was performed documenting return of the iridium source into the GammaMed safe.   PLAN: The patient will return next week for her third high-dose-rate treatment. ________________________________   Blair Promise, PhD, MD  This document serves as a record of services personally performed by Gery Pray, MD. It was created on his behalf by Clerance Lav, a trained medical scribe. The creation of this record is based on the scribe's personal observations and the provider's statements to them. This document has been checked and approved by the attending provider.

## 2019-10-04 ENCOUNTER — Inpatient Hospital Stay (HOSPITAL_COMMUNITY): Payer: Medicare Other

## 2019-10-04 ENCOUNTER — Encounter (HOSPITAL_COMMUNITY): Payer: Self-pay

## 2019-10-04 ENCOUNTER — Inpatient Hospital Stay (HOSPITAL_COMMUNITY): Payer: Medicare Other | Attending: Hematology | Admitting: Hematology

## 2019-10-04 ENCOUNTER — Encounter (HOSPITAL_COMMUNITY): Payer: Self-pay | Admitting: Hematology

## 2019-10-04 VITALS — BP 127/64 | HR 79 | Temp 97.5°F | Resp 18

## 2019-10-04 DIAGNOSIS — R200 Anesthesia of skin: Secondary | ICD-10-CM | POA: Diagnosis not present

## 2019-10-04 DIAGNOSIS — Z7901 Long term (current) use of anticoagulants: Secondary | ICD-10-CM | POA: Insufficient documentation

## 2019-10-04 DIAGNOSIS — I10 Essential (primary) hypertension: Secondary | ICD-10-CM | POA: Diagnosis not present

## 2019-10-04 DIAGNOSIS — R450 Nervousness: Secondary | ICD-10-CM | POA: Diagnosis not present

## 2019-10-04 DIAGNOSIS — Z818 Family history of other mental and behavioral disorders: Secondary | ICD-10-CM | POA: Diagnosis not present

## 2019-10-04 DIAGNOSIS — R5383 Other fatigue: Secondary | ICD-10-CM | POA: Insufficient documentation

## 2019-10-04 DIAGNOSIS — Z808 Family history of malignant neoplasm of other organs or systems: Secondary | ICD-10-CM | POA: Insufficient documentation

## 2019-10-04 DIAGNOSIS — Z5189 Encounter for other specified aftercare: Secondary | ICD-10-CM | POA: Diagnosis not present

## 2019-10-04 DIAGNOSIS — Z801 Family history of malignant neoplasm of trachea, bronchus and lung: Secondary | ICD-10-CM | POA: Insufficient documentation

## 2019-10-04 DIAGNOSIS — Z800 Family history of malignant neoplasm of digestive organs: Secondary | ICD-10-CM | POA: Insufficient documentation

## 2019-10-04 DIAGNOSIS — M79606 Pain in leg, unspecified: Secondary | ICD-10-CM | POA: Diagnosis not present

## 2019-10-04 DIAGNOSIS — C50412 Malignant neoplasm of upper-outer quadrant of left female breast: Secondary | ICD-10-CM | POA: Insufficient documentation

## 2019-10-04 DIAGNOSIS — Z87891 Personal history of nicotine dependence: Secondary | ICD-10-CM | POA: Insufficient documentation

## 2019-10-04 DIAGNOSIS — Z8349 Family history of other endocrine, nutritional and metabolic diseases: Secondary | ICD-10-CM | POA: Insufficient documentation

## 2019-10-04 DIAGNOSIS — C541 Malignant neoplasm of endometrium: Secondary | ICD-10-CM

## 2019-10-04 DIAGNOSIS — E876 Hypokalemia: Secondary | ICD-10-CM | POA: Insufficient documentation

## 2019-10-04 DIAGNOSIS — Z79899 Other long term (current) drug therapy: Secondary | ICD-10-CM | POA: Diagnosis not present

## 2019-10-04 DIAGNOSIS — Z823 Family history of stroke: Secondary | ICD-10-CM | POA: Diagnosis not present

## 2019-10-04 DIAGNOSIS — R197 Diarrhea, unspecified: Secondary | ICD-10-CM | POA: Diagnosis not present

## 2019-10-04 DIAGNOSIS — Z8249 Family history of ischemic heart disease and other diseases of the circulatory system: Secondary | ICD-10-CM | POA: Diagnosis not present

## 2019-10-04 DIAGNOSIS — Z8049 Family history of malignant neoplasm of other genital organs: Secondary | ICD-10-CM | POA: Insufficient documentation

## 2019-10-04 DIAGNOSIS — Z5111 Encounter for antineoplastic chemotherapy: Secondary | ICD-10-CM | POA: Insufficient documentation

## 2019-10-04 DIAGNOSIS — R202 Paresthesia of skin: Secondary | ICD-10-CM | POA: Insufficient documentation

## 2019-10-04 DIAGNOSIS — Z8041 Family history of malignant neoplasm of ovary: Secondary | ICD-10-CM | POA: Insufficient documentation

## 2019-10-04 DIAGNOSIS — R0602 Shortness of breath: Secondary | ICD-10-CM | POA: Diagnosis not present

## 2019-10-04 DIAGNOSIS — Z90722 Acquired absence of ovaries, bilateral: Secondary | ICD-10-CM | POA: Diagnosis not present

## 2019-10-04 DIAGNOSIS — Z809 Family history of malignant neoplasm, unspecified: Secondary | ICD-10-CM | POA: Insufficient documentation

## 2019-10-04 DIAGNOSIS — Z95828 Presence of other vascular implants and grafts: Secondary | ICD-10-CM

## 2019-10-04 DIAGNOSIS — Z8261 Family history of arthritis: Secondary | ICD-10-CM | POA: Insufficient documentation

## 2019-10-04 DIAGNOSIS — Z841 Family history of disorders of kidney and ureter: Secondary | ICD-10-CM | POA: Insufficient documentation

## 2019-10-04 LAB — CBC WITH DIFFERENTIAL/PLATELET
Abs Immature Granulocytes: 0.03 10*3/uL (ref 0.00–0.07)
Basophils Absolute: 0 10*3/uL (ref 0.0–0.1)
Basophils Relative: 1 %
Eosinophils Absolute: 0 10*3/uL (ref 0.0–0.5)
Eosinophils Relative: 1 %
HCT: 26.6 % — ABNORMAL LOW (ref 36.0–46.0)
Hemoglobin: 8.6 g/dL — ABNORMAL LOW (ref 12.0–15.0)
Immature Granulocytes: 1 %
Lymphocytes Relative: 24 %
Lymphs Abs: 1.4 10*3/uL (ref 0.7–4.0)
MCH: 32.7 pg (ref 26.0–34.0)
MCHC: 32.3 g/dL (ref 30.0–36.0)
MCV: 101.1 fL — ABNORMAL HIGH (ref 80.0–100.0)
Monocytes Absolute: 0.6 10*3/uL (ref 0.1–1.0)
Monocytes Relative: 11 %
Neutro Abs: 3.6 10*3/uL (ref 1.7–7.7)
Neutrophils Relative %: 62 %
Platelets: 317 10*3/uL (ref 150–400)
RBC: 2.63 MIL/uL — ABNORMAL LOW (ref 3.87–5.11)
RDW: 18.9 % — ABNORMAL HIGH (ref 11.5–15.5)
WBC: 5.6 10*3/uL (ref 4.0–10.5)
nRBC: 0 % (ref 0.0–0.2)

## 2019-10-04 LAB — COMPREHENSIVE METABOLIC PANEL
ALT: 22 U/L (ref 0–44)
AST: 21 U/L (ref 15–41)
Albumin: 3.4 g/dL — ABNORMAL LOW (ref 3.5–5.0)
Alkaline Phosphatase: 87 U/L (ref 38–126)
Anion gap: 10 (ref 5–15)
BUN: 11 mg/dL (ref 8–23)
CO2: 27 mmol/L (ref 22–32)
Calcium: 8.5 mg/dL — ABNORMAL LOW (ref 8.9–10.3)
Chloride: 100 mmol/L (ref 98–111)
Creatinine, Ser: 0.9 mg/dL (ref 0.44–1.00)
GFR calc Af Amer: >60 mL/min (ref >60)
GFR calc non Af Amer: >60 mL/min (ref >60)
Glucose, Bld: 147 mg/dL — ABNORMAL HIGH (ref 70–99)
Potassium: 3.5 mmol/L (ref 3.5–5.1)
Sodium: 137 mmol/L (ref 135–145)
Total Bilirubin: 0.4 mg/dL (ref 0.3–1.2)
Total Protein: 6.6 g/dL (ref 6.5–8.1)

## 2019-10-04 LAB — MAGNESIUM: Magnesium: 1.4 mg/dL — ABNORMAL LOW (ref 1.7–2.4)

## 2019-10-04 MED ORDER — PALONOSETRON HCL INJECTION 0.25 MG/5ML
0.2500 mg | Freq: Once | INTRAVENOUS | Status: AC
  Administered 2019-10-04: 0.25 mg via INTRAVENOUS
  Filled 2019-10-04: qty 5

## 2019-10-04 MED ORDER — SODIUM CHLORIDE 0.9 % IV SOLN
150.0000 mg | Freq: Once | INTRAVENOUS | Status: AC
  Administered 2019-10-04: 150 mg via INTRAVENOUS
  Filled 2019-10-04: qty 150

## 2019-10-04 MED ORDER — SODIUM CHLORIDE 0.9% FLUSH
10.0000 mL | INTRAVENOUS | Status: DC | PRN
  Administered 2019-10-04: 10 mL

## 2019-10-04 MED ORDER — METHYLPREDNISOLONE SODIUM SUCC 125 MG IJ SOLR
125.0000 mg | Freq: Once | INTRAMUSCULAR | Status: AC
  Administered 2019-10-04: 125 mg via INTRAVENOUS
  Filled 2019-10-04: qty 2

## 2019-10-04 MED ORDER — SODIUM CHLORIDE 0.9 % IV SOLN
175.0000 mg/m2 | Freq: Once | INTRAVENOUS | Status: AC
  Administered 2019-10-04: 414 mg via INTRAVENOUS
  Filled 2019-10-04: qty 69

## 2019-10-04 MED ORDER — OCTREOTIDE ACETATE 30 MG IM KIT
PACK | INTRAMUSCULAR | Status: AC
  Filled 2019-10-04: qty 1

## 2019-10-04 MED ORDER — DIPHENHYDRAMINE HCL 50 MG/ML IJ SOLN
50.0000 mg | Freq: Once | INTRAMUSCULAR | Status: AC
  Administered 2019-10-04: 50 mg via INTRAVENOUS
  Filled 2019-10-04: qty 1

## 2019-10-04 MED ORDER — SODIUM CHLORIDE 0.9 % IV SOLN: Freq: Once | INTRAVENOUS | Status: AC

## 2019-10-04 MED ORDER — SODIUM CHLORIDE 0.9 % IV SOLN
3.0000 g | Freq: Once | INTRAVENOUS | Status: AC
  Administered 2019-10-04: 3 g via INTRAVENOUS
  Filled 2019-10-04: qty 6

## 2019-10-04 MED ORDER — SODIUM CHLORIDE 0.9 % IV SOLN
745.2000 mg | Freq: Once | INTRAVENOUS | Status: AC
  Administered 2019-10-04: 750 mg via INTRAVENOUS
  Filled 2019-10-04: qty 75

## 2019-10-04 MED ORDER — SODIUM CHLORIDE 0.9 % IV SOLN
10.0000 mg | Freq: Once | INTRAVENOUS | Status: AC
  Administered 2019-10-04: 09:00:00 10 mg via INTRAVENOUS
  Filled 2019-10-04: qty 10

## 2019-10-04 MED ORDER — FAMOTIDINE IN NACL 20-0.9 MG/50ML-% IV SOLN
20.0000 mg | Freq: Once | INTRAVENOUS | Status: AC
  Administered 2019-10-04: 20 mg via INTRAVENOUS
  Filled 2019-10-04: qty 50

## 2019-10-04 MED ORDER — TRAMADOL HCL 50 MG PO TABS: 50.0000 mg | ORAL_TABLET | Freq: Four times a day (QID) | ORAL | 0 refills | Status: DC | PRN

## 2019-10-04 MED ORDER — HEPARIN SOD (PORK) LOCK FLUSH 100 UNIT/ML IV SOLN
500.0000 [IU] | Freq: Once | INTRAVENOUS | Status: AC | PRN
  Administered 2019-10-04: 500 [IU]

## 2019-10-04 NOTE — Assessment & Plan Note (Addendum)
1.  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 -CT CAP on 06/02/2019 did not show any evidence of metastatic disease. -4 cycles of carboplatin and paclitaxel from 07/05/2019 through 09/13/2019. -She received 2/5 treatments of vaginal brachytherapy on 09/21/2019 and 09/30/2019. -I have reviewed her CBC and LFTs which are within normal limits. -She will proceed with cycle 5 today.  I will reevaluate her in 3 weeks.  I plan to repeat scans after cycle 6.  2.  Hypomagnesemia: -She will continue magnesium 2 tablets twice daily. -Magnesium today is low at 1.4.  She will receive 3 g of magnesium. -Home magnesium will be increased to 2 tablets 3 times a day.  3.  Hypokalemia: -She will continue potassium 20 mEq twice daily.  Potassium is 3.5 today.  4.  Severe body pains: -She is taking tramadol 50 mg twice daily as needed during the first few days after each treatment.  We will send a refill today.  5.  Hypertension: -Metoprolol was dose reduced to 25 mg half tablet twice daily.  She is continuing Lasix 40 mg twice daily. -Today blood pressure is 111/50.

## 2019-10-04 NOTE — Progress Notes (Signed)
[place], [place] [address]   CLINIC:  Medical Oncology/Hematology  PCP:  [name], MD [place] [address] [phone]   REASON FOR VISIT:  Follow-up for endometrioid adenocarcinoma.  CURRENT THERAPY: Carboplatin and paclitaxel.  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  Insufficient material for HER2 testing.   12/28/2013 Breast MRI   Post biopsy changes located within the left breast laterally (upper-outer quadrant) related to the patient's recent stereotactic biopsy. Also, postsurgical scarring changes within the left breast. No areas of worrisome enhancement within the right breast.   01/26/2014 Definitive Surgery   Left modified radical mastectomy by Dr. Arnoldo Morale   02/01/2014 Pathology Results   Intermediate grade DCIS, 0.5 cm, 0/6 lymph nodes, negative resection margins.   03/13/2016 Imaging   Bone density- BMD as determined from Femur Neck Right is 1.023 g/cm2 with a T-Score of -0.1. This patient is considered normal according to [facility]) criteria.   Endometrial cancer ([place])  04/22/2019 Initial Biopsy   EMB - Gr1 EMCA   04/28/2019 Initial Diagnosis   Endometrial cancer determined by uterine biopsy ([place])   05/18/2019 Surgery   TRH/BSO, bilateral SLNs SLNs bulky.    Pathologic Stage   IIIC1, +LVSI, outer half MI, endometrioid adenoca, G 1-2 MSI-H   07/05/2019 -  Chemotherapy   The patient had palonosetron (ALOXI) injection 0.25 mg, 0.25 mg, Intravenous,  Once, 5 of 6 cycles Administration: 0.25 mg (07/05/2019), 0.25 mg (07/26/2019), 0.25 mg (08/16/2019), 0.25 mg (09/13/2019), 0.25 mg (10/04/2019) pegfilgrastim-jmdb (FULPHILA) injection 6 mg, 6 mg, Subcutaneous,  Once, 5 of 6 cycles Administration: 6 mg  (07/07/2019), 6 mg (07/28/2019), 6 mg (08/18/2019), 6 mg (09/15/2019) CARBOplatin (PARAPLATIN) 750 mg in sodium chloride 0.9 % 250 mL chemo infusion, 750 mg (100 % of original dose 745.2 mg), Intravenous,  Once, 5 of 6 cycles Dose modification:   (original dose 745.2 mg, Cycle 1),   (original dose 745.2 mg, Cycle 2),   (original dose 745.2 mg, Cycle 3),   (original dose 745.2 mg, Cycle 4),   (original dose 745.2 mg, Cycle 5) Administration: 750 mg (07/05/2019), 750 mg (07/26/2019), 750 mg (08/16/2019), 750 mg (09/13/2019), 750 mg (10/04/2019) fosaprepitant (EMEND) 150 mg in sodium chloride 0.9 % 145 mL IVPB, 150 mg, Intravenous,  Once, 5 of 6 cycles Administration: 150 mg (07/05/2019), 150 mg (07/26/2019), 150 mg (08/16/2019), 150 mg (09/13/2019), 150 mg (10/04/2019) PACLitaxel (TAXOL) 414 mg in sodium chloride 0.9 % 500 mL chemo infusion (> 45m/m2), 175 mg/m2 = 414 mg, Intravenous,  Once, 5 of 6 cycles Administration: 414 mg (07/05/2019), 414 mg (07/26/2019), 414 mg (08/16/2019), 414 mg (09/13/2019), 414 mg (10/04/2019)  for chemotherapy treatment.       CANCER STAGING: Cancer Staging Breast cancer ([facility] Staging form: Breast, AJCC 7th Edition - Clinical stage from 02/01/2014: Stage 0 (Tis (DCIS), N0, M0) - Signed by KBaird Cancer PA-C on 12/01/2015  Endometrial cancer ([facility] Staging form: Corpus Uteri - Carcinoma and Carcinosarcoma, AJCC 8th Edition - Clinical stage from 05/18/2019: FIGO Stage IIIC1 (cT1b, cN1a(sn)) - Unsigned    INTERVAL HISTORY:  Ms. [id number]. female seen for follow-up and toxicity assessment prior to cycle 5 of chemotherapy.  Cycle 4 was on 09/13/2019.  Reported some aches and pains  in the legs lasting the first week of chemotherapy.  Appetite is not depressed.  Energy levels are 75%.  Numbness in the toes at bedtime present and has not worsened.  Mild fatigue is also stable.  Shortness of breath on exertion is also stable.  Denies any fevers or chills.   REVIEW OF SYSTEMS:  Review of  Systems  Constitutional: Positive for fatigue.  Respiratory: Positive for shortness of breath.   Neurological: Positive for numbness.  All other systems reviewed and are negative.    PAST MEDICAL/SURGICAL HISTORY:  Past Medical History:  Diagnosis Date  . Ankle pain   . Anxiety   . Arthritis   . Asthma    Seasonal asthma  . Breast cancer ([place]) 2008   ILC of Left breast; ER+  . Breast cancer ([place]) 2015   IDC+DCIS of Left breast; ER/PR+, Her2-, Ki67 = 4%  . Depression   . Endometrial cancer ([place])   . GERD (gastroesophageal reflux disease)   . H/O hiatal hernia 2003  . Hepatitis    history in early 20's.  Marland Kitchen History of atrial fibrillation 2011  . History of blood transfusion   . History of hepatitis A    Age 30  . History of pneumonia   . Hyperactive gag reflex   . Hyperlipidemia   . Hypertension   . Knee pain   . Obesity, Class III, BMI 40-49.9 (morbid obesity) ([place])   . OSA on CPAP   . Pneumonia   . Port-A-Cath in place 06/28/2019  . Sciatica   . Vertigo    Past Surgical History:  Procedure Laterality Date  . BREAST LUMPECTOMY Left 1982,2008   x 2   . CHOLECYSTECTOMY N/A 12/31/2016   Procedure: LAPAROSCOPIC CHOLECYSTECTOMY WITH POSSIBLE INTRAOPERATIVE CHOLANGIOGRAM;  Surgeon: Rolm Bookbinder, MD;  Location: [facility] [facility];  Service: General;  Laterality: N/A;  . COLONOSCOPY    . EYE SURGERY     bil cataract surgery  . HIATAL HERNIA REPAIR  2003  . Lymph node removal  2008  . MASTECTOMY MODIFIED RADICAL Left 01/26/2014   Procedure: LEFT MODIFIED RADICAL MASTECTOMY;  Surgeon: Jamesetta So, MD;  Location: [facility];  Service: General;  Laterality: Left;  . PORT-A-CATH REMOVAL  2009 ish  . PORTA CATH INSERTION  [id number]  . PORTACATH PLACEMENT Right 06/23/2019   Procedure: INSERTION PORT-A-CATH;  Surgeon: Aviva Signs, MD;  Location: [facility];  Service: General;  Laterality: Right;  . ROBOTIC ASSISTED TOTAL HYSTERECTOMY WITH BILATERAL SALPINGO OOPHERECTOMY N/A 05/18/2019    Procedure: XI ROBOTIC ASSISTED TOTAL HYSTERECTOMY WITH BILATERAL SALPINGO OOPHORECTOMY;  Surgeon: Lafonda Mosses, MD;  Location: [facility];  Service: Gynecology;  Laterality: N/A;  . SENTINEL NODE BIOPSY N/A 05/18/2019   Procedure: SENTINEL LYMPH NODE BIOPSY;  Surgeon: Lafonda Mosses, MD;  Location: [facility];  Service: Gynecology;  Laterality: N/A;  . TOTAL KNEE ARTHROPLASTY Right 2006  . TOTAL KNEE ARTHROPLASTY  08/12/2011   Procedure: TOTAL KNEE ARTHROPLASTY;  Surgeon: Arther Abbott, MD;  Location: [facility];  Service: Orthopedics;  Laterality: Left;  . TUBAL LIGATION  1980     SOCIAL HISTORY:  Social History   Socioeconomic History  . Marital status: Widowed    Spouse name: Not on file  . Number of children: 2  . Years of education: 63  . Highest education level: Not on file  Occupational History  . Occupation: retired  Tobacco Use  . Smoking status: Former Smoker    Packs/day: 3.00  Years: 25.00    Pack years: 75.00    Types: Cigarettes    Quit date: 06/30/1997    Years since quitting: 22.2  . Smokeless tobacco: Never Used  Substance and Sexual Activity  . Alcohol use: Yes    Alcohol/week: 0.0 standard drinks    Comment: 2 glasses wine per month  . Drug use: No  . Sexual activity: Not Currently    Birth control/protection: Surgical    Comment: tubal  Other Topics Concern  . Not on file  Social History Narrative   Drinks 2-3 cups of coffee a day    Social Determinants of Health   Financial Resource Strain:   . Difficulty of Paying Living Expenses:   Food Insecurity:   . Worried About Charity fundraiser in the Last Year:   . Arboriculturist in the Last Year:   Transportation Needs:   . Film/video editor (Medical):   Marland Kitchen Lack of Transportation (Non-Medical):   Physical Activity:   . Days of Exercise per Week:   . Minutes of Exercise per Session:   Stress:   . Feeling of Stress :   Social Connections:   . Frequency of Communication with Friends and  Family:   . Frequency of Social Gatherings with Friends and Family:   . Attends Religious Services:   . Active Member of Clubs or Organizations:   . Attends Archivist Meetings:   Marland Kitchen Marital Status:   Intimate Partner Violence:   . Fear of Current or Ex-Partner:   . Emotionally Abused:   Marland Kitchen Physically Abused:   . Sexually Abused:     FAMILY HISTORY:  Family History  Problem Relation Age of Onset  . Stroke Father   . Heart disease Father   . Skin cancer Sister        dx. 68-58; unknown type  . Throat cancer Maternal Uncle 75       smoker; voicebox removed  . Alzheimer's disease Paternal Grandmother   . Heart attack Paternal Grandfather   . Alzheimer's disease Maternal Aunt   . Heart disease Maternal Uncle   . Lung cancer Cousin        smoker  . Lung cancer Paternal Aunt        dx. mid-70s; worked at [employer]  . Heart attack Daughter        caused by thyroid issues  . Heart disease Other   . Arthritis Other   . Lung disease Other   . Cancer Other   . Kidney disease Other   . Colon cancer Other   . Ovarian cancer Other   . Endometrial cancer Other     CURRENT MEDICATIONS:  Outpatient Encounter Medications as of 10/04/2019  Medication Sig  . albuterol (PROVENTIL) (2.5 MG/3ML) 0.083% nebulizer solution Take 3 mLs by nebulization 4 (four) times daily as needed for wheezing or shortness of breath.   Marland Kitchen albuterol (VENTOLIN HFA) 108 (90 Base) MCG/ACT inhaler Inhale 1-2 puffs into the lungs every 6 (six) hours as needed for wheezing or shortness of breath.  Marland Kitchen apixaban (ELIQUIS) 5 MG TABS tablet Take 1 tablet (5 mg total) by mouth 2 (two) times daily.  Marland Kitchen buPROPion (WELLBUTRIN XL) 300 MG 24 hr tablet Take 300 mg by mouth daily.  . Calcium Carbonate-Vit D-Min (CALCIUM 1200 PO) Take 1 tablet by mouth daily.   Marland Kitchen CARBOPLATIN IV Inject into the vein every 21 ( twenty-one) days.  . furosemide (LASIX) 40 MG  tablet TAKE 1 TABLET TWICE A DAY  . lidocaine-prilocaine (EMLA)  cream Apply a small amount to port a cath site and cover with plastic wrap 1 hour prior to chemotherapy appointments  . loratadine (CLARITIN) 10 MG tablet Take 10 mg by mouth daily.  . magnesium oxide (MAG-OX) 400 (241.3 Mg) MG tablet Take 2 tablets (800 mg total) by mouth 2 (two) times daily.  . metoprolol tartrate (LOPRESSOR) 25 MG tablet TAKE 1 TABLET TWICE A DAY  . omeprazole (PRILOSEC) 20 MG capsule Take 20 mg by mouth 2 (two) times daily.   Marland Kitchen PACLITAXEL IV Inject into the vein every 21 ( twenty-one) days.  . potassium chloride SA (KLOR-CON M20) 20 MEQ tablet Take 2 tablets (40 mEq total) by mouth every morning. & 20 meq in the evening (Patient taking differently: Take 20 mEq by mouth 2 (two) times daily. )  . prochlorperazine (COMPAZINE) 10 MG tablet Take 1 tablet (10 mg total) by mouth every 6 (six) hours as needed (Nausea or vomiting).  . rosuvastatin (CRESTOR) 20 MG tablet Take 20 mg by mouth at bedtime.   . sertraline (ZOLOFT) 100 MG tablet Take 100 mg by mouth at bedtime.  . traMADol (ULTRAM) 50 MG tablet Take 1 tablet (50 mg total) by mouth every 6 (six) hours as needed for severe pain.  . [DISCONTINUED] traMADol (ULTRAM) 50 MG tablet Take 1 tablet (50 mg total) by mouth every 6 (six) hours as needed for severe pain.   No facility-administered encounter medications on file as of 10/04/2019.    ALLERGIES:  No Known Allergies   PHYSICAL EXAM:  ECOG Performance status: 1  Vitals:   10/04/19 0747  BP: (!) 111/50  Pulse: 86  Resp: 18  Temp: (!) 97.5 F (36.4 C)  SpO2: 98%   Filed Weights   10/04/19 0747  Weight: 262 lb 3.2 oz (118.9 kg)    Physical Exam Vitals reviewed.  Constitutional:      Appearance: Normal appearance.  Cardiovascular:     Rate and Rhythm: Normal rate and regular rhythm.     Heart sounds: Normal heart sounds.  Pulmonary:     Effort: Pulmonary effort is normal.     Breath sounds: Normal breath sounds.  Abdominal:     General: There is no  distension.     Palpations: Abdomen is soft. There is no mass.  Musculoskeletal:        General: No swelling.  Skin:    General: Skin is warm.  Neurological:     General: No focal deficit present.     Mental Status: She is alert and oriented to person, place, and time.  Psychiatric:        Mood and Affect: Mood normal.        Behavior: Behavior normal.      LABORATORY DATA:  I have reviewed the labs as listed.  CBC    Component Value Date/Time   WBC 5.6 10/04/2019 0746   RBC 2.63 (L) 10/04/2019 0746   HGB 8.6 (L) 10/04/2019 0746   HCT 26.6 (L) 10/04/2019 0746   PLT 317 10/04/2019 0746   MCV 101.1 (H) 10/04/2019 0746   MCH 32.7 10/04/2019 0746   MCHC 32.3 10/04/2019 0746   RDW 18.9 (H) 10/04/2019 0746   LYMPHSABS 1.4 10/04/2019 0746   MONOABS 0.6 10/04/2019 0746   EOSABS 0.0 10/04/2019 0746   BASOSABS 0.0 10/04/2019 0746   CMP Latest Ref Rng & Units 10/04/2019 09/13/2019 09/06/2019  Glucose 70 -  99 mg/dL 147(H) 184(H) 149(H)  BUN 8 - 23 mg/dL 11 12 17   Creatinine 0.44 - 1.00 mg/dL 0.90 0.79 0.80  Sodium 135 - 145 mmol/L 137 137 136  Potassium 3.5 - 5.1 mmol/L 3.5 3.8 3.8  Chloride 98 - 111 mmol/L 100 100 99  CO2 22 - 32 mmol/L 27 26 28   Calcium 8.9 - 10.3 mg/dL 8.5(L) 9.0 8.9  Total Protein 6.5 - 8.1 g/dL 6.6 6.8 7.0  Total Bilirubin 0.3 - 1.2 mg/dL 0.4 0.5 0.4  Alkaline Phos 38 - 126 U/L 87 81 93  AST 15 - 41 U/L 21 23 19   ALT 0 - 44 U/L 22 24 19        DIAGNOSTIC IMAGING:  I have reviewed her scans.     ASSESSMENT & PLAN:   Endometrial cancer ([place]) 1.  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 -CT CAP on 06/02/2019 did not show any evidence of metastatic disease. -4 cycles of carboplatin and paclitaxel from 07/05/2019 through 09/13/2019. -She received 2/5 treatments of vaginal brachytherapy on 09/21/2019 and 09/30/2019. -I have reviewed her CBC and LFTs which are within normal limits. -She will proceed with cycle 5  today.  I will reevaluate her in 3 weeks.  I plan to repeat scans after cycle 6.  2.  Hypomagnesemia: -She will continue magnesium 2 tablets twice daily. -Magnesium today is low at 1.4.  She will receive 3 g of magnesium. -Home magnesium will be increased to 2 tablets 3 times a day.  3.  Hypokalemia: -She will continue potassium 20 mEq twice daily.  Potassium is 3.5 today.  4.  Severe body pains: -She is taking tramadol 50 mg twice daily as needed during the first few days after each treatment.  We will send a refill today.  5.  Hypertension: -Metoprolol was dose reduced to 25 mg half tablet twice daily.  She is continuing Lasix 40 mg twice daily. -Today blood pressure is 111/50.     Orders placed this encounter:  No orders of the defined types were placed in this encounter.     Derek Jack, MD [facility] [phone]

## 2019-10-04 NOTE — Progress Notes (Signed)
Patient has been assessed, vital signs and labs have been reviewed by Dr. Delton Coombes. ANC, Creatinine, LFTs, and Platelets are within treatment parameters per Dr. Delton Coombes. Magnesium is low today.  Please administer Magnesium 3 grams IV per Dr. Delton Coombes. The patient is good to proceed with treatment at this time.

## 2019-10-04 NOTE — Progress Notes (Signed)
Patient tolerated chemotherapy with no complaints voiced.  Side effects with management reviewed with understanding verbalized.  Port site clean and dry with no bruising or swelling noted at site.  Good blood return noted before and after administration of chemotherapy.  Band aid applied.  Patient left by wheelchair with VSS and no s/s of distress noted.  

## 2019-10-04 NOTE — Patient Instructions (Addendum)
[place] at [facility] Discharge Instructions  You were seen today by Dr. Delton Coombes. He went over your recent lab results. Your magnesium is low today so you will receive Magnesium IV today.  Increase Magnesium to 2 pills three times per day.  He will see you back in 3 weeks for labs, treatment and follow up.   Thank you for choosing [place] at [facility] to provide your oncology and hematology care.  To afford each patient quality time with our provider, please arrive at least 15 minutes before your scheduled appointment time.   If you have a lab appointment with the [facility] please come in thru the  Main Entrance and check in at the main information desk  You need to re-schedule your appointment should you arrive 10 or more minutes late.  We strive to give you quality time with our providers, and arriving late affects you and other patients whose appointments are after yours.  Also, if you no show three or more times for appointments you may be dismissed from the clinic at the providers discretion.     Again, thank you for choosing [facility].  Our hope is that these requests will decrease the amount of time that you wait before being seen by our physicians.       _____________________________________________________________  Should you have questions after your visit to [facility], please contact our office at [phone] between the hours of 8:00 a.m. and 4:30 p.m.  Voicemails left after 4:00 p.m. will not be returned until the following business day.  For prescription refill requests, have your pharmacy contact our office and allow 72 hours.    Cancer Center Support Programs:   > Cancer Support Group  2nd Tuesday of the month 1pm-2pm, Journey Room

## 2019-10-06 ENCOUNTER — Other Ambulatory Visit: Payer: Self-pay

## 2019-10-06 ENCOUNTER — Encounter (HOSPITAL_COMMUNITY): Payer: Self-pay

## 2019-10-06 ENCOUNTER — Inpatient Hospital Stay (HOSPITAL_COMMUNITY): Payer: Medicare Other

## 2019-10-06 ENCOUNTER — Telehealth: Payer: Self-pay | Admitting: *Deleted

## 2019-10-06 VITALS — BP 108/55 | HR 82 | Temp 97.6°F | Resp 18

## 2019-10-06 DIAGNOSIS — E876 Hypokalemia: Secondary | ICD-10-CM | POA: Diagnosis not present

## 2019-10-06 DIAGNOSIS — C50412 Malignant neoplasm of upper-outer quadrant of left female breast: Secondary | ICD-10-CM | POA: Diagnosis not present

## 2019-10-06 DIAGNOSIS — C541 Malignant neoplasm of endometrium: Secondary | ICD-10-CM

## 2019-10-06 DIAGNOSIS — Z5189 Encounter for other specified aftercare: Secondary | ICD-10-CM | POA: Diagnosis not present

## 2019-10-06 DIAGNOSIS — Z5111 Encounter for antineoplastic chemotherapy: Secondary | ICD-10-CM | POA: Diagnosis not present

## 2019-10-06 DIAGNOSIS — Z95828 Presence of other vascular implants and grafts: Secondary | ICD-10-CM

## 2019-10-06 MED ORDER — PEGFILGRASTIM-JMDB 6 MG/0.6ML ~~LOC~~ SOSY
6.0000 mg | PREFILLED_SYRINGE | Freq: Once | SUBCUTANEOUS | Status: AC
  Administered 2019-10-06: 10:00:00 6 mg via SUBCUTANEOUS
  Filled 2019-10-06: qty 0.6

## 2019-10-06 NOTE — Telephone Encounter (Signed)
Called patient to remind of HDR Tx. for 10-07-19 @ 1 pm, spoke with patient and she is aware of this appt.

## 2019-10-06 NOTE — Progress Notes (Signed)
Patient tolerated Fulphila injection with no complaints voiced.  Site clean and dry with no bruising or swelling noted at site.  Band aid applied.  Vss with discharge and left by wheelchair with no s/s of distress noted.

## 2019-10-07 ENCOUNTER — Ambulatory Visit: Payer: Medicare Other | Admitting: Radiation Oncology

## 2019-10-07 ENCOUNTER — Inpatient Hospital Stay (HOSPITAL_COMMUNITY): Payer: Medicare Other

## 2019-10-07 DIAGNOSIS — E876 Hypokalemia: Secondary | ICD-10-CM | POA: Diagnosis not present

## 2019-10-07 DIAGNOSIS — Z5111 Encounter for antineoplastic chemotherapy: Secondary | ICD-10-CM | POA: Diagnosis not present

## 2019-10-07 DIAGNOSIS — C541 Malignant neoplasm of endometrium: Secondary | ICD-10-CM | POA: Diagnosis not present

## 2019-10-07 DIAGNOSIS — Z5189 Encounter for other specified aftercare: Secondary | ICD-10-CM | POA: Diagnosis not present

## 2019-10-07 DIAGNOSIS — C50412 Malignant neoplasm of upper-outer quadrant of left female breast: Secondary | ICD-10-CM | POA: Diagnosis not present

## 2019-10-07 LAB — CBC WITH DIFFERENTIAL/PLATELET
Abs Immature Granulocytes: 1.26 10*3/uL — ABNORMAL HIGH (ref 0.00–0.07)
Basophils Absolute: 0 10*3/uL (ref 0.0–0.1)
Basophils Relative: 0 %
Eosinophils Absolute: 0.1 10*3/uL (ref 0.0–0.5)
Eosinophils Relative: 0 %
HCT: 25.8 % — ABNORMAL LOW (ref 36.0–46.0)
Hemoglobin: 8.5 g/dL — ABNORMAL LOW (ref 12.0–15.0)
Immature Granulocytes: 7 %
Lymphocytes Relative: 8 %
Lymphs Abs: 1.4 10*3/uL (ref 0.7–4.0)
MCH: 32.8 pg (ref 26.0–34.0)
MCHC: 32.9 g/dL (ref 30.0–36.0)
MCV: 99.6 fL (ref 80.0–100.0)
Monocytes Absolute: 0.2 10*3/uL (ref 0.1–1.0)
Monocytes Relative: 1 %
Neutro Abs: 14.1 10*3/uL — ABNORMAL HIGH (ref 1.7–7.7)
Neutrophils Relative %: 84 %
Platelets: 223 10*3/uL (ref 150–400)
RBC: 2.59 MIL/uL — ABNORMAL LOW (ref 3.87–5.11)
RDW: 18.2 % — ABNORMAL HIGH (ref 11.5–15.5)
WBC: 17 10*3/uL — ABNORMAL HIGH (ref 4.0–10.5)
nRBC: 0 % (ref 0.0–0.2)

## 2019-10-07 LAB — COMPREHENSIVE METABOLIC PANEL
ALT: 24 U/L (ref 0–44)
AST: 22 U/L (ref 15–41)
Albumin: 3.4 g/dL — ABNORMAL LOW (ref 3.5–5.0)
Alkaline Phosphatase: 84 U/L (ref 38–126)
Anion gap: 10 (ref 5–15)
BUN: 14 mg/dL (ref 8–23)
CO2: 28 mmol/L (ref 22–32)
Calcium: 8.6 mg/dL — ABNORMAL LOW (ref 8.9–10.3)
Chloride: 99 mmol/L (ref 98–111)
Creatinine, Ser: 0.71 mg/dL (ref 0.44–1.00)
GFR calc Af Amer: >60 mL/min (ref >60)
GFR calc non Af Amer: >60 mL/min (ref >60)
Glucose, Bld: 142 mg/dL — ABNORMAL HIGH (ref 70–99)
Potassium: 3.5 mmol/L (ref 3.5–5.1)
Sodium: 137 mmol/L (ref 135–145)
Total Bilirubin: 0.9 mg/dL (ref 0.3–1.2)
Total Protein: 6.3 g/dL — ABNORMAL LOW (ref 6.5–8.1)

## 2019-10-07 LAB — MAGNESIUM: Magnesium: 1.7 mg/dL (ref 1.7–2.4)

## 2019-10-07 LAB — PREPARE RBC (CROSSMATCH)

## 2019-10-08 ENCOUNTER — Inpatient Hospital Stay (HOSPITAL_COMMUNITY): Payer: Medicare Other

## 2019-10-08 ENCOUNTER — Other Ambulatory Visit: Payer: Self-pay

## 2019-10-08 VITALS — BP 128/53 | HR 88 | Temp 97.9°F

## 2019-10-08 DIAGNOSIS — C541 Malignant neoplasm of endometrium: Secondary | ICD-10-CM

## 2019-10-08 DIAGNOSIS — D649 Anemia, unspecified: Secondary | ICD-10-CM

## 2019-10-08 DIAGNOSIS — C50412 Malignant neoplasm of upper-outer quadrant of left female breast: Secondary | ICD-10-CM | POA: Diagnosis not present

## 2019-10-08 DIAGNOSIS — Z5189 Encounter for other specified aftercare: Secondary | ICD-10-CM | POA: Diagnosis not present

## 2019-10-08 DIAGNOSIS — E876 Hypokalemia: Secondary | ICD-10-CM | POA: Diagnosis not present

## 2019-10-08 DIAGNOSIS — Z5111 Encounter for antineoplastic chemotherapy: Secondary | ICD-10-CM | POA: Diagnosis not present

## 2019-10-08 MED ORDER — ACETAMINOPHEN 325 MG PO TABS
ORAL_TABLET | ORAL | Status: AC
  Filled 2019-10-08: qty 2

## 2019-10-08 MED ORDER — SODIUM CHLORIDE 0.9% IV SOLUTION
250.0000 mL | Freq: Once | INTRAVENOUS | Status: AC
  Administered 2019-10-08: 250 mL via INTRAVENOUS

## 2019-10-08 MED ORDER — HEPARIN SOD (PORK) LOCK FLUSH 100 UNIT/ML IV SOLN: 500.0000 [IU] | Freq: Every day | INTRAVENOUS | Status: AC | PRN

## 2019-10-08 MED ORDER — ACETAMINOPHEN 325 MG PO TABS
650.0000 mg | ORAL_TABLET | Freq: Once | ORAL | Status: AC
  Administered 2019-10-08: 08:00:00 650 mg via ORAL

## 2019-10-08 MED ORDER — DIPHENHYDRAMINE HCL 25 MG PO CAPS
ORAL_CAPSULE | ORAL | Status: AC
  Filled 2019-10-08: qty 1

## 2019-10-08 MED ORDER — DIPHENHYDRAMINE HCL 25 MG PO CAPS
25.0000 mg | ORAL_CAPSULE | Freq: Once | ORAL | Status: AC
  Administered 2019-10-08: 08:00:00 25 mg via ORAL

## 2019-10-08 MED ORDER — SODIUM CHLORIDE 0.9% FLUSH: 10.0000 mL | INTRAVENOUS | Status: AC | PRN

## 2019-10-08 NOTE — Progress Notes (Signed)
One unit of blood given today per orders.  Patient tolerated it well without problems. Vitals stable and discharged home from clinic via wheelchair.  Follow up as scheduled.

## 2019-10-08 NOTE — Addendum Note (Signed)
Addended by: Donnie Aho on: 10/08/2019 09:03 AM   Modules accepted: Orders

## 2019-10-09 LAB — TYPE AND SCREEN
ABO/RH(D): O POS
Antibody Screen: NEGATIVE
Unit division: 0

## 2019-10-09 LAB — BPAM RBC
Blood Product Expiration Date: 202105132359
ISSUE DATE / TIME: 202104090852
Unit Type and Rh: 5100

## 2019-10-13 ENCOUNTER — Telehealth: Payer: Self-pay | Admitting: *Deleted

## 2019-10-13 NOTE — Progress Notes (Signed)
  Radiation Oncology         [phone]) [phone] ________________________________  Name: Stephanie Jarvis MRN: [id number]  Date: 10/14/2019  DOB: 1951/01/07  CC: [name], MD  [name], MD  HDR BRACHYTHERAPY NOTE  DIAGNOSIS: Stage IIIC (pT1b, pN1a) Endometrioid Adenocarcinoma   Simple treatment device note: Patient had construction of her custom vaginal cylinder. She will be treated with a 3.0 cm diameter segmented cylinder. This conforms to her anatomy without undue discomfort.  Vaginal brachytherapy procedure node: The patient was brought to the [place] suite. Identity was confirmed. All relevant records and images related to the planned course of therapy were reviewed. The patient freely provided informed written consent to proceed with treatment after reviewing the details related to the planned course of therapy. The consent form was witnessed and verified by the simulation staff. Then, the patient was set-up in a stable reproducible supine position for radiation therapy. Pelvic exam revealed the vaginal cuff to be intact . The patient's custom vaginal cylinder was placed in the proximal vagina. This was affixed to the CT/MR stabilization plate to prevent slippage. Patient tolerated the placement well.  Verification simulation note:  A fiducial marker was placed within the vaginal cylinder. An AP and lateral film was then obtained through the pelvis area. This documented accurate position of the vaginal cylinder for treatment.  HDR BRACHYTHERAPY TREATMENT  The remote afterloading device was affixed to the vaginal cylinder by catheter. Patient then proceeded to undergo her third high-dose-rate treatment directed at the proximal vagina. The patient was prescribed a dose of 6.0 gray to be delivered to the mucosal surface. Treatment length was 3.5 cm. Patient was treated with 1 channel using 8 dwell positions. Treatment time was 186.8 seconds. Iridium 192 was the high-dose-rate source for  treatment. The patient tolerated the treatment well. After completion of her therapy, a radiation survey was performed documenting return of the iridium source into the GammaMed safe.   PLAN: The patient will return next week for her fourth high-dose-rate treatment. ________________________________   Blair Promise, PhD, MD  This document serves as a record of services personally performed by Gery Pray, MD. It was created on his behalf by Clerance Lav, a trained medical scribe. The creation of this record is based on the scribe's personal observations and the provider's statements to them. This document has been checked and approved by the attending provider.

## 2019-10-13 NOTE — Telephone Encounter (Signed)
CALLED PATIENT TO REMIND OF HDR [place] 10-14-19 @ 1 PM, SPOKE WITH PATIENT AND SHE IS AWARE OF THIS APPT.

## 2019-10-14 ENCOUNTER — Other Ambulatory Visit: Payer: Self-pay

## 2019-10-14 ENCOUNTER — Ambulatory Visit
Admission: RE | Admit: 2019-10-14 | Discharge: 2019-10-14 | Disposition: A | Discharge status: Home / Self Care | Payer: Medicare Other | Source: Ambulatory Visit | Attending: Radiation Oncology | Admitting: Radiation Oncology

## 2019-10-14 DIAGNOSIS — C541 Malignant neoplasm of endometrium: Secondary | ICD-10-CM

## 2019-10-14 DIAGNOSIS — C569 Malignant neoplasm of unspecified ovary: Secondary | ICD-10-CM | POA: Diagnosis not present

## 2019-10-19 NOTE — Progress Notes (Signed)

## 2019-10-20 ENCOUNTER — Telehealth: Payer: Self-pay | Admitting: *Deleted

## 2019-10-20 NOTE — Progress Notes (Signed)
  Radiation Oncology         [phone] ________________________________  Name: Stephanie Jarvis MRN: [id number]  Date: 10/21/2019  DOB: 06-27-1951  CC: [name], MD  [name], MD  HDR BRACHYTHERAPY NOTE  DIAGNOSIS: Stage IIIC (pT1b, pN1a) Endometrioid Adenocarcinoma   Simple treatment device note: Patient had construction of her custom vaginal cylinder. She will be treated with a 3.0 cm diameter segmented cylinder. This conforms to her anatomy without undue discomfort.  Vaginal brachytherapy procedure node: The patient was brought to the [place] suite. Identity was confirmed. All relevant records and images related to the planned course of therapy were reviewed. The patient freely provided informed written consent to proceed with treatment after reviewing the details related to the planned course of therapy. The consent form was witnessed and verified by the simulation staff. Then, the patient was set-up in a stable reproducible supine position for radiation therapy. Pelvic exam revealed the vaginal cuff to be intact . The patient's custom vaginal cylinder was placed in the proximal vagina. This was affixed to the CT/MR stabilization plate to prevent slippage. Patient tolerated the placement well.  Verification simulation note:  A fiducial marker was placed within the vaginal cylinder. An AP and lateral film was then obtained through the pelvis area. This documented accurate position of the vaginal cylinder for treatment.  HDR BRACHYTHERAPY TREATMENT  The remote afterloading device was affixed to the vaginal cylinder by catheter. Patient then proceeded to undergo her fourth high-dose-rate treatment directed at the proximal vagina. The patient was prescribed a dose of 6.0 gray to be delivered to the mucosal surface. Treatment length was 3.5 cm. Patient was treated with 1 channel using 8 dwell positions. Treatment time was 199.6 seconds. Iridium 192 was the high-dose-rate source for  treatment. The patient tolerated the treatment well. After completion of her therapy, a radiation survey was performed documenting return of the iridium source into the GammaMed safe.   PLAN: The patient will return next week for her fifth and final high-dose-rate treatment. ________________________________   Blair Promise, PhD, MD  This document serves as a record of services personally performed by Gery Pray, MD. It was created on his behalf by Clerance Lav, a trained medical scribe. The creation of this record is based on the scribe's personal observations and the provider's statements to them. This document has been checked and approved by the attending provider.

## 2019-10-20 NOTE — Telephone Encounter (Signed)
CALLED PATIENT TO REMIND OF HDR [place] 10-21-19 @ 1 PM, SPOKE WITH PATIENT AND SHE IS AWARE OF THIS [place].

## 2019-10-21 ENCOUNTER — Ambulatory Visit
Admission: RE | Admit: 2019-10-21 | Discharge: 2019-10-21 | Disposition: A | Discharge status: Home / Self Care | Payer: Medicare Other | Source: Ambulatory Visit | Attending: Radiation Oncology | Admitting: Radiation Oncology

## 2019-10-21 ENCOUNTER — Other Ambulatory Visit: Payer: Self-pay

## 2019-10-21 DIAGNOSIS — C541 Malignant neoplasm of endometrium: Secondary | ICD-10-CM

## 2019-10-21 DIAGNOSIS — C569 Malignant neoplasm of unspecified ovary: Secondary | ICD-10-CM | POA: Diagnosis not present

## 2019-10-25 ENCOUNTER — Inpatient Hospital Stay (HOSPITAL_COMMUNITY): Payer: Medicare Other

## 2019-10-25 ENCOUNTER — Inpatient Hospital Stay (HOSPITAL_BASED_OUTPATIENT_CLINIC_OR_DEPARTMENT_OTHER): Payer: Medicare Other | Admitting: Hematology

## 2019-10-25 ENCOUNTER — Encounter (HOSPITAL_COMMUNITY): Payer: Self-pay | Admitting: Hematology

## 2019-10-25 ENCOUNTER — Other Ambulatory Visit: Payer: Self-pay

## 2019-10-25 VITALS — BP 127/66 | HR 89 | Temp 97.8°F | Resp 18

## 2019-10-25 VITALS — BP 127/54 | HR 82 | Temp 97.1°F | Resp 20 | Wt 261.0 lb

## 2019-10-25 DIAGNOSIS — D649 Anemia, unspecified: Secondary | ICD-10-CM

## 2019-10-25 DIAGNOSIS — C541 Malignant neoplasm of endometrium: Secondary | ICD-10-CM

## 2019-10-25 DIAGNOSIS — Z5189 Encounter for other specified aftercare: Secondary | ICD-10-CM | POA: Diagnosis not present

## 2019-10-25 DIAGNOSIS — E876 Hypokalemia: Secondary | ICD-10-CM | POA: Diagnosis not present

## 2019-10-25 DIAGNOSIS — Z95828 Presence of other vascular implants and grafts: Secondary | ICD-10-CM

## 2019-10-25 DIAGNOSIS — Z5111 Encounter for antineoplastic chemotherapy: Secondary | ICD-10-CM | POA: Diagnosis not present

## 2019-10-25 DIAGNOSIS — C50412 Malignant neoplasm of upper-outer quadrant of left female breast: Secondary | ICD-10-CM | POA: Diagnosis not present

## 2019-10-25 LAB — CBC WITH DIFFERENTIAL/PLATELET
Abs Immature Granulocytes: 0.01 10*3/uL (ref 0.00–0.07)
Basophils Absolute: 0 10*3/uL (ref 0.0–0.1)
Basophils Relative: 0 %
Eosinophils Absolute: 0 10*3/uL (ref 0.0–0.5)
Eosinophils Relative: 0 %
HCT: 23.4 % — ABNORMAL LOW (ref 36.0–46.0)
Hemoglobin: 7.6 g/dL — ABNORMAL LOW (ref 12.0–15.0)
Immature Granulocytes: 0 %
Lymphocytes Relative: 28 %
Lymphs Abs: 1.3 10*3/uL (ref 0.7–4.0)
MCH: 33.3 pg (ref 26.0–34.0)
MCHC: 32.5 g/dL (ref 30.0–36.0)
MCV: 102.6 fL — ABNORMAL HIGH (ref 80.0–100.0)
Monocytes Absolute: 0.6 10*3/uL (ref 0.1–1.0)
Monocytes Relative: 13 %
Neutro Abs: 2.6 10*3/uL (ref 1.7–7.7)
Neutrophils Relative %: 59 %
Platelets: 182 10*3/uL (ref 150–400)
RBC: 2.28 MIL/uL — ABNORMAL LOW (ref 3.87–5.11)
RDW: 20.3 % — ABNORMAL HIGH (ref 11.5–15.5)
WBC: 4.6 10*3/uL (ref 4.0–10.5)
nRBC: 0 % (ref 0.0–0.2)

## 2019-10-25 LAB — COMPREHENSIVE METABOLIC PANEL
ALT: 19 U/L (ref 0–44)
AST: 18 U/L (ref 15–41)
Albumin: 3.4 g/dL — ABNORMAL LOW (ref 3.5–5.0)
Alkaline Phosphatase: 91 U/L (ref 38–126)
Anion gap: 11 (ref 5–15)
BUN: 13 mg/dL (ref 8–23)
CO2: 27 mmol/L (ref 22–32)
Calcium: 8.8 mg/dL — ABNORMAL LOW (ref 8.9–10.3)
Chloride: 99 mmol/L (ref 98–111)
Creatinine, Ser: 0.86 mg/dL (ref 0.44–1.00)
GFR calc Af Amer: >60 mL/min (ref >60)
GFR calc non Af Amer: >60 mL/min (ref >60)
Glucose, Bld: 170 mg/dL — ABNORMAL HIGH (ref 70–99)
Potassium: 3.9 mmol/L (ref 3.5–5.1)
Sodium: 137 mmol/L (ref 135–145)
Total Bilirubin: 0.5 mg/dL (ref 0.3–1.2)
Total Protein: 6.5 g/dL (ref 6.5–8.1)

## 2019-10-25 LAB — MAGNESIUM: Magnesium: 1.4 mg/dL — ABNORMAL LOW (ref 1.7–2.4)

## 2019-10-25 LAB — PREPARE RBC (CROSSMATCH)

## 2019-10-25 MED ORDER — SODIUM CHLORIDE 0.9% IV SOLUTION
250.0000 mL | Freq: Once | INTRAVENOUS | Status: AC
  Administered 2019-10-25: 250 mL via INTRAVENOUS

## 2019-10-25 MED ORDER — PALONOSETRON HCL INJECTION 0.25 MG/5ML
0.2500 mg | Freq: Once | INTRAVENOUS | Status: AC
  Administered 2019-10-25: 0.25 mg via INTRAVENOUS
  Filled 2019-10-25: qty 5

## 2019-10-25 MED ORDER — SODIUM CHLORIDE 0.9 % IV SOLN: Freq: Once | INTRAVENOUS | Status: AC

## 2019-10-25 MED ORDER — SODIUM CHLORIDE 0.9% FLUSH
10.0000 mL | INTRAVENOUS | Status: DC | PRN
  Administered 2019-10-25: 10 mL

## 2019-10-25 MED ORDER — METHYLPREDNISOLONE SODIUM SUCC 125 MG IJ SOLR
125.0000 mg | Freq: Once | INTRAMUSCULAR | Status: AC
  Administered 2019-10-25: 125 mg via INTRAVENOUS
  Filled 2019-10-25: qty 2

## 2019-10-25 MED ORDER — DIPHENHYDRAMINE HCL 50 MG/ML IJ SOLN
50.0000 mg | Freq: Once | INTRAMUSCULAR | Status: AC
  Administered 2019-10-25: 50 mg via INTRAVENOUS
  Filled 2019-10-25: qty 1

## 2019-10-25 MED ORDER — SODIUM CHLORIDE 0.9 % IV SOLN
745.2000 mg | Freq: Once | INTRAVENOUS | Status: AC
  Administered 2019-10-25: 750 mg via INTRAVENOUS
  Filled 2019-10-25: qty 75

## 2019-10-25 MED ORDER — SODIUM CHLORIDE 0.9 % IV SOLN
175.0000 mg/m2 | Freq: Once | INTRAVENOUS | Status: AC
  Administered 2019-10-25: 414 mg via INTRAVENOUS
  Filled 2019-10-25: qty 69

## 2019-10-25 MED ORDER — MAGNESIUM SULFATE 2 GM/50ML IV SOLN
2.0000 g | Freq: Once | INTRAVENOUS | Status: AC
  Administered 2019-10-25: 2 g via INTRAVENOUS
  Filled 2019-10-25: qty 50

## 2019-10-25 MED ORDER — FAMOTIDINE IN NACL 20-0.9 MG/50ML-% IV SOLN
20.0000 mg | Freq: Once | INTRAVENOUS | Status: AC
  Administered 2019-10-25: 20 mg via INTRAVENOUS
  Filled 2019-10-25: qty 50

## 2019-10-25 MED ORDER — HEPARIN SOD (PORK) LOCK FLUSH 100 UNIT/ML IV SOLN
500.0000 [IU] | Freq: Once | INTRAVENOUS | Status: AC | PRN
  Administered 2019-10-25: 500 [IU]

## 2019-10-25 MED ORDER — SODIUM CHLORIDE 0.9 % IV SOLN
10.0000 mg | Freq: Once | INTRAVENOUS | Status: AC
  Administered 2019-10-25: 10 mg via INTRAVENOUS
  Filled 2019-10-25: qty 10

## 2019-10-25 MED ORDER — SODIUM CHLORIDE 0.9 % IV SOLN
150.0000 mg | Freq: Once | INTRAVENOUS | Status: AC
  Administered 2019-10-25: 150 mg via INTRAVENOUS
  Filled 2019-10-25: qty 150

## 2019-10-25 MED ORDER — ACETAMINOPHEN 325 MG PO TABS
650.0000 mg | ORAL_TABLET | Freq: Once | ORAL | Status: AC
  Administered 2019-10-25: 650 mg via ORAL

## 2019-10-25 MED ORDER — DIPHENHYDRAMINE HCL 25 MG PO CAPS: 25.0000 mg | ORAL_CAPSULE | Freq: Once | ORAL | Status: DC

## 2019-10-25 NOTE — Patient Instructions (Signed)
[facility] Discharge Instructions for Patients Receiving Chemotherapy   Beginning January 23rd 2017 lab work for the [facility] will be done in the  Main lab at [facility] on 1st floor. If you have a lab appointment with the [facility] please come in thru the  Main Entrance and check in at the main information desk   Today you received the following chemotherapy agents Taxol and Carboplatin as well as Magnesium infusion and 1 unit of blood today. Follow-up as scheduled. Call clinic for any questions or concerns  To help prevent nausea and vomiting after your treatment, we encourage you to take your nausea medication   If you develop nausea and vomiting, or diarrhea that is not controlled by your medication, call the clinic.  The clinic phone number is [phone]. Office hours are Monday-Friday 8:30am-5:00pm.  BELOW ARE SYMPTOMS THAT SHOULD BE REPORTED IMMEDIATELY:  *FEVER GREATER THAN 101.0 F  *CHILLS WITH OR WITHOUT FEVER  NAUSEA AND VOMITING THAT IS NOT CONTROLLED WITH YOUR NAUSEA MEDICATION  *UNUSUAL SHORTNESS OF BREATH  *UNUSUAL BRUISING OR BLEEDING  TENDERNESS IN MOUTH AND THROAT WITH OR WITHOUT PRESENCE OF ULCERS  *URINARY PROBLEMS  *BOWEL PROBLEMS  UNUSUAL RASH Items with * indicate a potential emergency and should be followed up as soon as possible. If you have an emergency after office hours please contact your primary care physician or go to the nearest emergency department.  Please call the clinic during office hours if you have any questions or concerns.   You may also contact the Patient Navigator at [phone] should you have any questions or need assistance in obtaining follow up care.      Resources For Cancer Patients and their Caregivers ? American Cancer Society: Can assist with transportation, wigs, general needs, runs Look Good Feel Better.        502 095 9244 ? Cancer Care: Provides financial assistance, online  support groups, medication/co-pay assistance.  1-800-813-HOPE 919-106-1212) ? [place] Assists [place] cancer patients and their families through emotional , educational and financial support.  585-205-9126 ? [place] DSS Where to apply for food stamps, Medicaid and utility assistance. 830-547-8756 ? [facility]: Transportation to medical appointments. 340-284-2862 ? Social Security Administration: May apply for disability if have a Stage IV cancer. (989) 500-7251 (801)379-5745 ? [employer], Disability and Transit Services: Assists with nutrition, care and transit needs. 803-399-2044

## 2019-10-25 NOTE — Progress Notes (Signed)
Patient has been assessed, vital signs and labs have been reviewed by Dr. Delton Coombes. ANC, Creatinine, LFTs, and Platelets are within treatment parameters, magnesium low, please give 2 grams of magnesium IV per Dr. Delton Coombes. The patient is good to proceed with treatment at this time. Please also give 1 unit PRBC today per Dr. Delton Coombes.

## 2019-10-25 NOTE — Progress Notes (Signed)
[place], [place] [address]   CLINIC:  Medical Oncology/Hematology  PCP:  [name], MD [place] [address] [phone]   REASON FOR VISIT:  Follow-up for endometrioid adenocarcinoma.  CURRENT THERAPY: Carboplatin and paclitaxel.  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  Insufficient material for HER2 testing.   12/28/2013 Breast MRI   Post biopsy changes located within the left breast laterally (upper-outer quadrant) related to the patient's recent stereotactic biopsy. Also, postsurgical scarring changes within the left breast. No areas of worrisome enhancement within the right breast.   01/26/2014 Definitive Surgery   Left modified radical mastectomy by Dr. Arnoldo Morale   02/01/2014 Pathology Results   Intermediate grade DCIS, 0.5 cm, 0/6 lymph nodes, negative resection margins.   03/13/2016 Imaging   Bone density- BMD as determined from Femur Neck Right is 1.023 g/cm2 with a T-Score of -0.1. This patient is considered normal according to [facility]) criteria.   Endometrial cancer ([place])  04/22/2019 Initial Biopsy   EMB - Gr1 EMCA   04/28/2019 Initial Diagnosis   Endometrial cancer determined by uterine biopsy ([place])   05/18/2019 Surgery   TRH/BSO, bilateral SLNs SLNs bulky.    Pathologic Stage   IIIC1, +LVSI, outer half MI, endometrioid adenoca, G 1-2 MSI-H   07/05/2019 -  Chemotherapy   The patient had palonosetron (ALOXI) injection 0.25 mg, 0.25 mg, Intravenous,  Once, 6 of 6 cycles Administration: 0.25 mg (07/05/2019), 0.25 mg (07/26/2019), 0.25 mg (08/16/2019), 0.25 mg (09/13/2019), 0.25 mg (10/04/2019), 0.25 mg (10/25/2019) pegfilgrastim-jmdb (FULPHILA) injection 6 mg, 6 mg, Subcutaneous,  Once, 6 of 6  cycles Administration: 6 mg (07/07/2019), 6 mg (07/28/2019), 6 mg (08/18/2019), 6 mg (09/15/2019), 6 mg (10/06/2019) CARBOplatin (PARAPLATIN) 750 mg in sodium chloride 0.9 % 250 mL chemo infusion, 750 mg (100 % of original dose 745.2 mg), Intravenous,  Once, 6 of 6 cycles Dose modification:   (original dose 745.2 mg, Cycle 1),   (original dose 745.2 mg, Cycle 2),   (original dose 745.2 mg, Cycle 3),   (original dose 745.2 mg, Cycle 4),   (original dose 745.2 mg, Cycle 5),   (original dose 745.2 mg, Cycle 6) Administration: 750 mg (07/05/2019), 750 mg (07/26/2019), 750 mg (08/16/2019), 750 mg (09/13/2019), 750 mg (10/04/2019), 750 mg (10/25/2019) fosaprepitant (EMEND) 150 mg in sodium chloride 0.9 % 145 mL IVPB, 150 mg, Intravenous,  Once, 6 of 6 cycles Administration: 150 mg (07/05/2019), 150 mg (07/26/2019), 150 mg (08/16/2019), 150 mg (09/13/2019), 150 mg (10/04/2019), 150 mg (10/25/2019) PACLitaxel (TAXOL) 414 mg in sodium chloride 0.9 % 500 mL chemo infusion (> 10m/m2), 175 mg/m2 = 414 mg, Intravenous,  Once, 6 of 6 cycles Administration: 414 mg (07/05/2019), 414 mg (07/26/2019), 414 mg (08/16/2019), 414 mg (09/13/2019), 414 mg (10/04/2019), 414 mg (10/25/2019)  for chemotherapy treatment.       CANCER STAGING: Cancer Staging Breast cancer ([facility] Staging form: Breast, AJCC 7th Edition - Clinical stage from 02/01/2014: Stage 0 (Tis (DCIS), N0, M0) - Signed by KBaird Cancer PA-C on 12/01/2015  Endometrial cancer ([facility] Staging form: Corpus Uteri - Carcinoma and Carcinosarcoma, AJCC 8th Edition - Clinical stage from 05/18/2019: FIGO Stage IIIC1 (cT1b, cN1a(sn)) - Unsigned    INTERVAL HISTORY:  Ms. [id number]. female seen  for follow-up and tox adjustment prior to cycle 6 of chemotherapy.  Reported tingling in fingertips on and off.  Does feel cramping all the time.  She is taking magnesium 3 times a day.  She reports watery bowel movements x3 in the mornings.  She is taking Imodium as needed.   REVIEW OF SYSTEMS:   Review of Systems  Respiratory: Positive for shortness of breath.   Gastrointestinal: Positive for diarrhea.  Neurological: Positive for numbness.  Psychiatric/Behavioral: The patient is nervous/anxious.   All other systems reviewed and are negative.    PAST MEDICAL/SURGICAL HISTORY:  Past Medical History:  Diagnosis Date  . Ankle pain   . Anxiety   . Arthritis   . Asthma    Seasonal asthma  . Breast cancer ([place]) 2008   ILC of Left breast; ER+  . Breast cancer ([place]) 2015   IDC+DCIS of Left breast; ER/PR+, Her2-, Ki67 = 4%  . Depression   . Endometrial cancer ([place])   . GERD (gastroesophageal reflux disease)   . H/O hiatal hernia 2003  . Hepatitis    history in early 20's.  Marland Kitchen History of atrial fibrillation 2011  . History of blood transfusion   . History of hepatitis A    Age 69  . History of pneumonia   . Hyperactive gag reflex   . Hyperlipidemia   . Hypertension   . Knee pain   . Obesity, Class III, BMI 40-49.9 (morbid obesity) ([place])   . OSA on CPAP   . Pneumonia   . Port-A-Cath in place 06/28/2019  . Sciatica   . Vertigo    Past Surgical History:  Procedure Laterality Date  . BREAST LUMPECTOMY Left 1982,2008   x 2   . CHOLECYSTECTOMY N/A 12/31/2016   Procedure: LAPAROSCOPIC CHOLECYSTECTOMY WITH POSSIBLE INTRAOPERATIVE CHOLANGIOGRAM;  Surgeon: Rolm Bookbinder, MD;  Location: [facility];  Service: General;  Laterality: N/A;  . COLONOSCOPY    . EYE SURGERY     bil cataract surgery  . HIATAL HERNIA REPAIR  2003  . Lymph node removal  2008  . MASTECTOMY MODIFIED RADICAL Left 01/26/2014   Procedure: LEFT MODIFIED RADICAL MASTECTOMY;  Surgeon: Jamesetta So, MD;  Location: [facility];  Service: General;  Laterality: Left;  . PORT-A-CATH REMOVAL  2009 ish  . PORTA CATH INSERTION  [id number]  . PORTACATH PLACEMENT Right 06/23/2019   Procedure: INSERTION PORT-A-CATH;  Surgeon: Aviva Signs, MD;  Location: [facility];  Service: General;  Laterality: Right;  . ROBOTIC ASSISTED  TOTAL HYSTERECTOMY WITH BILATERAL SALPINGO OOPHERECTOMY N/A 05/18/2019   Procedure: XI ROBOTIC ASSISTED TOTAL HYSTERECTOMY WITH BILATERAL SALPINGO OOPHORECTOMY;  Surgeon: Lafonda Mosses, MD;  Location: [facility];  Service: Gynecology;  Laterality: N/A;  . SENTINEL NODE BIOPSY N/A 05/18/2019   Procedure: SENTINEL LYMPH NODE BIOPSY;  Surgeon: Lafonda Mosses, MD;  Location: [facility];  Service: Gynecology;  Laterality: N/A;  . TOTAL KNEE ARTHROPLASTY Right 2006  . TOTAL KNEE ARTHROPLASTY  08/12/2011   Procedure: TOTAL KNEE ARTHROPLASTY;  Surgeon: Arther Abbott, MD;  Location: [facility];  Service: Orthopedics;  Laterality: Left;  . TUBAL LIGATION  1980     SOCIAL HISTORY:  Social History   Socioeconomic History  . Marital status: Widowed    Spouse name: Not on file  . Number of children: 2  . Years of education: 64  . Highest education level: Not on file  Occupational History  . Occupation: retired  Tobacco Use  . Smoking status: Former Smoker  Packs/day: 3.00    Years: 25.00    Pack years: 75.00    Types: Cigarettes    Quit date: 06/30/1997    Years since quitting: 22.3  . Smokeless tobacco: Never Used  Substance and Sexual Activity  . Alcohol use: Yes    Alcohol/week: 0.0 standard drinks    Comment: 2 glasses wine per month  . Drug use: No  . Sexual activity: Not Currently    Birth control/protection: Surgical    Comment: tubal  Other Topics Concern  . Not on file  Social History Narrative   Drinks 2-3 cups of coffee a day    Social Determinants of Health   Financial Resource Strain:   . Difficulty of Paying Living Expenses:   Food Insecurity:   . Worried About Charity fundraiser in the Last Year:   . Arboriculturist in the Last Year:   Transportation Needs:   . Film/video editor (Medical):   Marland Kitchen Lack of Transportation (Non-Medical):   Physical Activity:   . Days of Exercise per Week:   . Minutes of Exercise per Session:   Stress:   . Feeling of Stress  :   Social Connections:   . Frequency of Communication with Friends and Family:   . Frequency of Social Gatherings with Friends and Family:   . Attends Religious Services:   . Active Member of Clubs or Organizations:   . Attends Archivist Meetings:   Marland Kitchen Marital Status:   Intimate Partner Violence:   . Fear of Current or Ex-Partner:   . Emotionally Abused:   Marland Kitchen Physically Abused:   . Sexually Abused:     FAMILY HISTORY:  Family History  Problem Relation Age of Onset  . Stroke Father   . Heart disease Father   . Skin cancer Sister        dx. 28-58; unknown type  . Throat cancer Maternal Uncle 75       smoker; voicebox removed  . Alzheimer's disease Paternal Grandmother   . Heart attack Paternal Grandfather   . Alzheimer's disease Maternal Aunt   . Heart disease Maternal Uncle   . Lung cancer Cousin        smoker  . Lung cancer Paternal Aunt        dx. mid-70s; worked at [employer]  . Heart attack Daughter        caused by thyroid issues  . Heart disease Other   . Arthritis Other   . Lung disease Other   . Cancer Other   . Kidney disease Other   . Colon cancer Other   . Ovarian cancer Other   . Endometrial cancer Other     CURRENT MEDICATIONS:  Outpatient Encounter Medications as of 10/25/2019  Medication Sig  . apixaban (ELIQUIS) 5 MG TABS tablet Take 1 tablet (5 mg total) by mouth 2 (two) times daily.  Marland Kitchen buPROPion (WELLBUTRIN XL) 300 MG 24 hr tablet Take 300 mg by mouth daily.  . Calcium Carbonate-Vit D-Min (CALCIUM 1200 PO) Take 1 tablet by mouth daily.   Marland Kitchen CARBOPLATIN IV Inject into the vein every 21 ( twenty-one) days.  . furosemide (LASIX) 40 MG tablet TAKE 1 TABLET TWICE A DAY  . loratadine (CLARITIN) 10 MG tablet Take 10 mg by mouth daily.  . magnesium oxide (MAG-OX) 400 (241.3 Mg) MG tablet Take 2 tablets (800 mg total) by mouth 2 (two) times daily.  . metoprolol tartrate (LOPRESSOR) 25 MG tablet  TAKE 1 TABLET TWICE A DAY  . omeprazole  (PRILOSEC) 20 MG capsule Take 20 mg by mouth 2 (two) times daily.   Marland Kitchen PACLITAXEL IV Inject into the vein every 21 ( twenty-one) days.  . potassium chloride SA (KLOR-CON M20) 20 MEQ tablet Take 2 tablets (40 mEq total) by mouth every morning. & 20 meq in the evening (Patient taking differently: Take 20 mEq by mouth 2 (two) times daily. )  . rosuvastatin (CRESTOR) 20 MG tablet Take 20 mg by mouth at bedtime.   . sertraline (ZOLOFT) 100 MG tablet Take 100 mg by mouth at bedtime.  Marland Kitchen albuterol (PROVENTIL) (2.5 MG/3ML) 0.083% nebulizer solution Take 3 mLs by nebulization 4 (four) times daily as needed for wheezing or shortness of breath.   Marland Kitchen albuterol (VENTOLIN HFA) 108 (90 Base) MCG/ACT inhaler Inhale 1-2 puffs into the lungs every 6 (six) hours as needed for wheezing or shortness of breath.  . lidocaine-prilocaine (EMLA) cream Apply a small amount to port a cath site and cover with plastic wrap 1 hour prior to chemotherapy appointments (Patient not taking: Reported on 10/25/2019)  . prochlorperazine (COMPAZINE) 10 MG tablet Take 1 tablet (10 mg total) by mouth every 6 (six) hours as needed (Nausea or vomiting). (Patient not taking: Reported on 10/25/2019)  . traMADol (ULTRAM) 50 MG tablet Take 1 tablet (50 mg total) by mouth every 6 (six) hours as needed for severe pain. (Patient not taking: Reported on 10/25/2019)   Facility-Administered Encounter Medications as of 10/25/2019  Medication  . heparin lock flush 100 unit/mL  . sodium chloride flush (NS) 0.9 % injection 10 mL    ALLERGIES:  No Known Allergies   PHYSICAL EXAM:  ECOG Performance status: 1  Vitals:   10/25/19 0821  BP: (!) 127/54  Pulse: 82  Resp: 20  Temp: (!) 97.1 F (36.2 C)  SpO2: 97%   Filed Weights   10/25/19 0821  Weight: 261 lb (118.4 kg)    Physical Exam Vitals reviewed.  Constitutional:      Appearance: Normal appearance.  Cardiovascular:     Rate and Rhythm: Normal rate and regular rhythm.     Heart sounds:  Normal heart sounds.  Pulmonary:     Effort: Pulmonary effort is normal.     Breath sounds: Normal breath sounds.  Abdominal:     General: There is no distension.     Palpations: Abdomen is soft. There is no mass.  Musculoskeletal:        General: No swelling.  Skin:    General: Skin is warm.  Neurological:     General: No focal deficit present.     Mental Status: She is alert and oriented to person, place, and time.  Psychiatric:        Mood and Affect: Mood normal.        Behavior: Behavior normal.      LABORATORY DATA:  I have reviewed the labs as listed.  CBC    Component Value Date/Time   WBC 4.6 10/25/2019 0833   RBC 2.28 (L) 10/25/2019 0833   HGB 7.6 (L) 10/25/2019 0833   HCT 23.4 (L) 10/25/2019 0833   PLT 182 10/25/2019 0833   MCV 102.6 (H) 10/25/2019 0833   MCH 33.3 10/25/2019 0833   MCHC 32.5 10/25/2019 0833   RDW 20.3 (H) 10/25/2019 0833   LYMPHSABS 1.3 10/25/2019 0833   MONOABS 0.6 10/25/2019 0833   EOSABS 0.0 10/25/2019 0833   BASOSABS 0.0 10/25/2019 7564  CMP Latest Ref Rng & Units 10/25/2019 10/07/2019 10/04/2019  Glucose 70 - 99 mg/dL 170(H) 142(H) 147(H)  BUN 8 - 23 mg/dL 13 14 11   Creatinine 0.44 - 1.00 mg/dL 0.86 0.71 0.90  Sodium 135 - 145 mmol/L 137 137 137  Potassium 3.5 - 5.1 mmol/L 3.9 3.5 3.5  Chloride 98 - 111 mmol/L 99 99 100  CO2 22 - 32 mmol/L 27 28 27   Calcium 8.9 - 10.3 mg/dL 8.8(L) 8.6(L) 8.5(L)  Total Protein 6.5 - 8.1 g/dL 6.5 6.3(L) 6.6  Total Bilirubin 0.3 - 1.2 mg/dL 0.5 0.9 0.4  Alkaline Phos 38 - 126 U/L 91 84 87  AST 15 - 41 U/L 18 22 21   ALT 0 - 44 U/L 19 24 22        DIAGNOSTIC IMAGING:  I have reviewed scans.     ASSESSMENT & PLAN:   Endometrial cancer ([place]) 1.  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 -CT CAP on 06/02/2019 did not show any evidence of metastatic disease. -5 cycles of carboplatin and paclitaxel from 07/05/2019 through 10/04/2019. -She received 4/5  treatments of vaginal brachytherapy started on 09/21/2019. -I reviewed her labs today.  Hemoglobin is 7.6.  White count and platelets are normal. -She may proceed with her cycle 6 today.  She will also receive 1 unit of PRBC. -I plan to see her back in 3 weeks.  I plan to repeat CT scans prior to next visit.  2.  Hypomagnesemia: -She will take magnesium 3 times a day.  Today magnesium is 1.4.  She will receive 2 g IV magnesium.  3.  Hypokalemia: -Potassium today is 3.9.  She will continue potassium 20 mEq twice daily.  4.  Severe body pains: -She takes tramadol 50 mg twice daily as needed for the first few days after each treatment for leg pains and body pains.  5.  Hypertension: -Metoprolol 25 mg half tablet twice daily.  Lasix 40 mg twice daily.  Blood pressure today is 127/54.     Orders placed this encounter:  Orders Placed This Encounter  Procedures  . CT Abdomen Pelvis W Contrast  . CBC with Differential/Platelet  . Comprehensive metabolic panel  . Magnesium  . [place], MD [place] [phone]

## 2019-10-25 NOTE — Progress Notes (Signed)
[place] reviewed with and pt seen by Dr. Delton Coombes and pt approved for Taxol and Carboplatin infusions today as well as Magnesium 2 grams IV and 1 unit of blood today for Hgb of 7.6 per MD                                                                                             Stephanie Jarvis tolerated chemo tx,blood transfusion and Magnesium infusion well without complaints or incident.Peripheral IV site checked with positive blood return noted prior to and after transfusion. VSS upon discharge. Pt discharged via wheelchair in satisfactory condition

## 2019-10-25 NOTE — Assessment & Plan Note (Signed)
1.  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 -CT CAP on 06/02/2019 did not show any evidence of metastatic disease. -5 cycles of carboplatin and paclitaxel from 07/05/2019 through 10/04/2019. -She received 4/5 treatments of vaginal brachytherapy started on 09/21/2019. -I reviewed her labs today.  Hemoglobin is 7.6.  White count and platelets are normal. -She may proceed with her cycle 6 today.  She will also receive 1 unit of PRBC. -I plan to see her back in 3 weeks.  I plan to repeat CT scans prior to next visit.  2.  Hypomagnesemia: -She will take magnesium 3 times a day.  Today magnesium is 1.4.  She will receive 2 g IV magnesium.  3.  Hypokalemia: -Potassium today is 3.9.  She will continue potassium 20 mEq twice daily.  4.  Severe body pains: -She takes tramadol 50 mg twice daily as needed for the first few days after each treatment for leg pains and body pains.  5.  Hypertension: -Metoprolol 25 mg half tablet twice daily.  Lasix 40 mg twice daily.  Blood pressure today is 127/54.

## 2019-10-25 NOTE — Patient Instructions (Addendum)
[place] at [facility] Discharge Instructions  You were seen today by Dr. Delton Coombes. He went over your recent lab results. He will see you back in 3 weeks for labs, repeat scans and follow up.   Thank you for choosing [place] at [facility] to provide your oncology and hematology care.  To afford each patient quality time with our provider, please arrive at least 15 minutes before your scheduled appointment time.   If you have a lab appointment with the [facility] please come in thru the  Main Entrance and check in at the main information desk  You need to re-schedule your appointment should you arrive 10 or more minutes late.  We strive to give you quality time with our providers, and arriving late affects you and other patients whose appointments are after yours.  Also, if you no show three or more times for appointments you may be dismissed from the clinic at the providers discretion.     Again, thank you for choosing [facility].  Our hope is that these requests will decrease the amount of time that you wait before being seen by our physicians.       _____________________________________________________________  Should you have questions after your visit to [facility], please contact our office at [phone] between the hours of 8:00 a.m. and 4:30 p.m.  Voicemails left after 4:00 p.m. will not be returned until the following business day.  For prescription refill requests, have your pharmacy contact our office and allow 72 hours.    Cancer Center Support Programs:   > Cancer Support Group  2nd Tuesday of the month 1pm-2pm, Journey Room

## 2019-10-26 LAB — BPAM RBC
Blood Product Expiration Date: 202105192359
ISSUE DATE / TIME: 202104261225
Unit Type and Rh: 5100

## 2019-10-26 LAB — TYPE AND SCREEN
ABO/RH(D): O POS
Antibody Screen: NEGATIVE
Unit division: 0

## 2019-10-27 ENCOUNTER — Other Ambulatory Visit: Payer: Self-pay

## 2019-10-27 ENCOUNTER — Telehealth: Payer: Self-pay | Admitting: *Deleted

## 2019-10-27 ENCOUNTER — Inpatient Hospital Stay (HOSPITAL_COMMUNITY): Payer: Medicare Other

## 2019-10-27 ENCOUNTER — Encounter (HOSPITAL_COMMUNITY): Payer: Self-pay

## 2019-10-27 VITALS — BP 116/61 | HR 85 | Temp 96.9°F | Resp 18

## 2019-10-27 DIAGNOSIS — C541 Malignant neoplasm of endometrium: Secondary | ICD-10-CM | POA: Diagnosis not present

## 2019-10-27 DIAGNOSIS — E876 Hypokalemia: Secondary | ICD-10-CM | POA: Diagnosis not present

## 2019-10-27 DIAGNOSIS — Z95828 Presence of other vascular implants and grafts: Secondary | ICD-10-CM

## 2019-10-27 DIAGNOSIS — Z5111 Encounter for antineoplastic chemotherapy: Secondary | ICD-10-CM | POA: Diagnosis not present

## 2019-10-27 DIAGNOSIS — Z5189 Encounter for other specified aftercare: Secondary | ICD-10-CM | POA: Diagnosis not present

## 2019-10-27 DIAGNOSIS — C50412 Malignant neoplasm of upper-outer quadrant of left female breast: Secondary | ICD-10-CM | POA: Diagnosis not present

## 2019-10-27 MED ORDER — PEGFILGRASTIM-JMDB 6 MG/0.6ML ~~LOC~~ SOSY
6.0000 mg | PREFILLED_SYRINGE | Freq: Once | SUBCUTANEOUS | Status: AC
  Administered 2019-10-27: 6 mg via SUBCUTANEOUS
  Filled 2019-10-27: qty 0.6

## 2019-10-27 NOTE — Progress Notes (Signed)
  Radiation Oncology         [phone]) [phone] ________________________________  Name: Stephanie Jarvis MRN: [id number]  Date: 10/28/2019  DOB: [birth year]  CC: [name], MD  [name], MD  HDR BRACHYTHERAPY NOTE  DIAGNOSIS: Stage IIIC (pT1b, pN1a) Endometrioid Adenocarcinoma   Simple treatment device note: Patient had construction of her custom vaginal cylinder. She will be treated with a 3.0 cm diameter segmented cylinder. This conforms to her anatomy without undue discomfort.  Vaginal brachytherapy procedure node: The patient was brought to the [place] suite. Identity was confirmed. All relevant records and images related to the planned course of therapy were reviewed. The patient freely provided informed written consent to proceed with treatment after reviewing the details related to the planned course of therapy. The consent form was witnessed and verified by the simulation staff. Then, the patient was set-up in a stable reproducible supine position for radiation therapy. Pelvic exam revealed the vaginal cuff to be intact . The patient's custom vaginal cylinder was placed in the proximal vagina. This was affixed to the CT/MR stabilization plate to prevent slippage. Patient tolerated the placement well.  Verification simulation note:  A fiducial marker was placed within the vaginal cylinder. An AP and lateral film was then obtained through the pelvis area. This documented accurate position of the vaginal cylinder for treatment.  HDR BRACHYTHERAPY TREATMENT  The remote afterloading device was affixed to the vaginal cylinder by catheter. Patient then proceeded to undergo her fifth high-dose-rate treatment directed at the proximal vagina. The patient was prescribed a dose of 6.0 gray to be delivered to the mucosal surface. Treatment length was 3.5 cm. Patient was treated with 1 channel using 8 dwell positions. Treatment time was 213.10 seconds. Iridium 192 was the high-dose-rate source for  treatment. The patient tolerated the treatment well. After completion of her therapy, a radiation survey was performed documenting return of the iridium source into the [employer] safe.   PLAN: The patient will follow-up with radiation oncology in one month.  She recently completed her adjuvant chemotherapy in addition. ________________________________   Blair Promise, PhD, MD  This document serves as a record of services personally performed by Gery Pray, MD. It was created on his behalf by Clerance Lav, a trained medical scribe. The creation of this record is based on the scribe's personal observations and the provider's statements to them. This document has been checked and approved by the attending provider.

## 2019-10-27 NOTE — Telephone Encounter (Signed)
CALLED PATIENT TO REMIND OF HDR [place] 10-28-19 @ 9 AM, LVM FOR A RETURN CALL

## 2019-10-27 NOTE — Progress Notes (Signed)
Patient tolerated injection with no complaints voiced.  Site clean and dry with no bruising or swelling noted at site.  Band aid applied.  Vss with discharge and left wheelchair with no s/s of distress noted.

## 2019-10-28 ENCOUNTER — Encounter: Payer: Self-pay | Admitting: Radiation Oncology

## 2019-10-28 ENCOUNTER — Ambulatory Visit
Admission: RE | Admit: 2019-10-28 | Discharge: 2019-10-28 | Disposition: A | Discharge status: Home / Self Care | Payer: Medicare Other | Source: Ambulatory Visit | Attending: Radiation Oncology | Admitting: Radiation Oncology

## 2019-10-28 ENCOUNTER — Other Ambulatory Visit: Payer: Self-pay

## 2019-10-28 DIAGNOSIS — C541 Malignant neoplasm of endometrium: Secondary | ICD-10-CM | POA: Diagnosis not present

## 2019-10-28 DIAGNOSIS — C569 Malignant neoplasm of unspecified ovary: Secondary | ICD-10-CM | POA: Diagnosis not present

## 2019-10-28 DIAGNOSIS — F339 Major depressive disorder, recurrent, unspecified: Secondary | ICD-10-CM | POA: Diagnosis not present

## 2019-10-28 DIAGNOSIS — K219 Gastro-esophageal reflux disease without esophagitis: Secondary | ICD-10-CM | POA: Diagnosis not present

## 2019-11-04 ENCOUNTER — Encounter (HOSPITAL_COMMUNITY): Payer: Self-pay | Admitting: *Deleted

## 2019-11-04 ENCOUNTER — Other Ambulatory Visit (HOSPITAL_COMMUNITY): Payer: Self-pay | Admitting: *Deleted

## 2019-11-04 DIAGNOSIS — C541 Malignant neoplasm of endometrium: Secondary | ICD-10-CM

## 2019-11-04 NOTE — Progress Notes (Signed)
Orders placed for blood bank sample. Patient called clinic reporting feeling weak and dizzy upon standing. She reports decrease in energy for the past few days.  She will come tomorrow for labs and fluids/blood as needed.

## 2019-11-05 ENCOUNTER — Inpatient Hospital Stay (HOSPITAL_COMMUNITY): Payer: Medicare Other | Attending: Hematology

## 2019-11-05 ENCOUNTER — Other Ambulatory Visit: Payer: Self-pay

## 2019-11-05 ENCOUNTER — Inpatient Hospital Stay (HOSPITAL_COMMUNITY): Payer: Medicare Other

## 2019-11-05 ENCOUNTER — Encounter (HOSPITAL_COMMUNITY): Payer: Self-pay

## 2019-11-05 DIAGNOSIS — E876 Hypokalemia: Secondary | ICD-10-CM | POA: Insufficient documentation

## 2019-11-05 DIAGNOSIS — C541 Malignant neoplasm of endometrium: Secondary | ICD-10-CM | POA: Insufficient documentation

## 2019-11-05 DIAGNOSIS — Z808 Family history of malignant neoplasm of other organs or systems: Secondary | ICD-10-CM | POA: Insufficient documentation

## 2019-11-05 DIAGNOSIS — Z79899 Other long term (current) drug therapy: Secondary | ICD-10-CM | POA: Insufficient documentation

## 2019-11-05 DIAGNOSIS — Z800 Family history of malignant neoplasm of digestive organs: Secondary | ICD-10-CM | POA: Insufficient documentation

## 2019-11-05 DIAGNOSIS — Z8261 Family history of arthritis: Secondary | ICD-10-CM | POA: Insufficient documentation

## 2019-11-05 DIAGNOSIS — Z7901 Long term (current) use of anticoagulants: Secondary | ICD-10-CM | POA: Diagnosis not present

## 2019-11-05 DIAGNOSIS — G629 Polyneuropathy, unspecified: Secondary | ICD-10-CM | POA: Insufficient documentation

## 2019-11-05 DIAGNOSIS — Z841 Family history of disorders of kidney and ureter: Secondary | ICD-10-CM | POA: Insufficient documentation

## 2019-11-05 DIAGNOSIS — Z90722 Acquired absence of ovaries, bilateral: Secondary | ICD-10-CM | POA: Diagnosis not present

## 2019-11-05 DIAGNOSIS — C50412 Malignant neoplasm of upper-outer quadrant of left female breast: Secondary | ICD-10-CM | POA: Diagnosis not present

## 2019-11-05 DIAGNOSIS — Z170 Estrogen receptor positive status [ER+]: Secondary | ICD-10-CM | POA: Insufficient documentation

## 2019-11-05 DIAGNOSIS — Z818 Family history of other mental and behavioral disorders: Secondary | ICD-10-CM | POA: Diagnosis not present

## 2019-11-05 DIAGNOSIS — R0602 Shortness of breath: Secondary | ICD-10-CM | POA: Insufficient documentation

## 2019-11-05 DIAGNOSIS — Z823 Family history of stroke: Secondary | ICD-10-CM | POA: Insufficient documentation

## 2019-11-05 DIAGNOSIS — Z8041 Family history of malignant neoplasm of ovary: Secondary | ICD-10-CM | POA: Diagnosis not present

## 2019-11-05 DIAGNOSIS — Z801 Family history of malignant neoplasm of trachea, bronchus and lung: Secondary | ICD-10-CM | POA: Diagnosis not present

## 2019-11-05 DIAGNOSIS — Z87891 Personal history of nicotine dependence: Secondary | ICD-10-CM | POA: Insufficient documentation

## 2019-11-05 DIAGNOSIS — D539 Nutritional anemia, unspecified: Secondary | ICD-10-CM | POA: Insufficient documentation

## 2019-11-05 DIAGNOSIS — Z8249 Family history of ischemic heart disease and other diseases of the circulatory system: Secondary | ICD-10-CM | POA: Diagnosis not present

## 2019-11-05 DIAGNOSIS — Z9221 Personal history of antineoplastic chemotherapy: Secondary | ICD-10-CM | POA: Diagnosis not present

## 2019-11-05 DIAGNOSIS — Z8049 Family history of malignant neoplasm of other genital organs: Secondary | ICD-10-CM | POA: Diagnosis not present

## 2019-11-05 LAB — CBC WITH DIFFERENTIAL/PLATELET
Abs Immature Granulocytes: 0.03 10*3/uL (ref 0.00–0.07)
Basophils Absolute: 0 10*3/uL (ref 0.0–0.1)
Basophils Relative: 0 %
Eosinophils Absolute: 0 10*3/uL (ref 0.0–0.5)
Eosinophils Relative: 0 %
HCT: 22.6 % — ABNORMAL LOW (ref 36.0–46.0)
Hemoglobin: 7.4 g/dL — ABNORMAL LOW (ref 12.0–15.0)
Immature Granulocytes: 1 %
Lymphocytes Relative: 34 %
Lymphs Abs: 1.6 10*3/uL (ref 0.7–4.0)
MCH: 33.3 pg (ref 26.0–34.0)
MCHC: 32.7 g/dL (ref 30.0–36.0)
MCV: 101.8 fL — ABNORMAL HIGH (ref 80.0–100.0)
Monocytes Absolute: 0.5 10*3/uL (ref 0.1–1.0)
Monocytes Relative: 11 %
Neutro Abs: 2.5 10*3/uL (ref 1.7–7.7)
Neutrophils Relative %: 54 %
Platelets: 61 10*3/uL — ABNORMAL LOW (ref 150–400)
RBC: 2.22 MIL/uL — ABNORMAL LOW (ref 3.87–5.11)
RDW: 17.7 % — ABNORMAL HIGH (ref 11.5–15.5)
WBC: 4.7 10*3/uL (ref 4.0–10.5)
nRBC: 0 % (ref 0.0–0.2)

## 2019-11-05 LAB — COMPREHENSIVE METABOLIC PANEL
ALT: 20 U/L (ref 0–44)
AST: 19 U/L (ref 15–41)
Albumin: 3.5 g/dL (ref 3.5–5.0)
Alkaline Phosphatase: 98 U/L (ref 38–126)
Anion gap: 11 (ref 5–15)
BUN: 14 mg/dL (ref 8–23)
CO2: 27 mmol/L (ref 22–32)
Calcium: 8.6 mg/dL — ABNORMAL LOW (ref 8.9–10.3)
Chloride: 100 mmol/L (ref 98–111)
Creatinine, Ser: 0.77 mg/dL (ref 0.44–1.00)
GFR calc Af Amer: >60 mL/min (ref >60)
GFR calc non Af Amer: >60 mL/min (ref >60)
Glucose, Bld: 152 mg/dL — ABNORMAL HIGH (ref 70–99)
Potassium: 3.8 mmol/L (ref 3.5–5.1)
Sodium: 138 mmol/L (ref 135–145)
Total Bilirubin: 0.7 mg/dL (ref 0.3–1.2)
Total Protein: 6.6 g/dL (ref 6.5–8.1)

## 2019-11-05 LAB — PREPARE RBC (CROSSMATCH)

## 2019-11-05 LAB — MAGNESIUM: Magnesium: 1.3 mg/dL — ABNORMAL LOW (ref 1.7–2.4)

## 2019-11-05 LAB — SAMPLE TO BLOOD BANK

## 2019-11-05 MED ORDER — SODIUM CHLORIDE 0.9 % IV SOLN
Freq: Once | INTRAVENOUS | Status: AC
  Filled 2019-11-05: qty 1000

## 2019-11-05 MED ORDER — HEPARIN SOD (PORK) LOCK FLUSH 100 UNIT/ML IV SOLN: 500.0000 [IU] | Freq: Every day | INTRAVENOUS | Status: DC | PRN

## 2019-11-05 MED ORDER — SODIUM CHLORIDE 0.9% FLUSH
10.0000 mL | INTRAVENOUS | Status: AC | PRN
  Administered 2019-11-05: 10 mL

## 2019-11-05 MED ORDER — ACETAMINOPHEN 325 MG PO TABS
650.0000 mg | ORAL_TABLET | Freq: Once | ORAL | Status: AC
  Administered 2019-11-05: 650 mg via ORAL
  Filled 2019-11-05: qty 2

## 2019-11-05 MED ORDER — HEPARIN SOD (PORK) LOCK FLUSH 100 UNIT/ML IV SOLN
500.0000 [IU] | Freq: Once | INTRAVENOUS | Status: AC | PRN
  Administered 2019-11-05: 500 [IU]

## 2019-11-05 MED ORDER — DIPHENHYDRAMINE HCL 25 MG PO CAPS
25.0000 mg | ORAL_CAPSULE | Freq: Once | ORAL | Status: AC
  Administered 2019-11-05: 25 mg via ORAL
  Filled 2019-11-05: qty 1

## 2019-11-05 MED ORDER — ALTEPLASE 2 MG IJ SOLR: 2.0000 mg | Freq: Once | INTRAMUSCULAR | Status: DC | PRN

## 2019-11-05 MED ORDER — SODIUM CHLORIDE 0.9% IV SOLUTION
250.0000 mL | Freq: Once | INTRAVENOUS | Status: AC
  Administered 2019-11-05: 250 mL via INTRAVENOUS

## 2019-11-05 NOTE — Patient Instructions (Signed)
[facility]  Discharge Instructions:   _______________________________________________________________  Thank you for choosing [facility] to provide your oncology and hematology care.  To afford each patient quality time with our providers, please arrive at least 15 minutes before your scheduled appointment.  You need to re-schedule your appointment if you arrive 10 or more minutes late.  We strive to give you quality time with our providers, and arriving late affects you and other patients whose appointments are after yours.  Also, if you no show three or more times for appointments you may be dismissed from the clinic.  Again, thank you for choosing [facility]. Our hope is that these requests will allow you access to exceptional care and in a timely manner. _______________________________________________________________  If you have questions after your visit, please contact our office at [phone] between the hours of 8:30 a.m. and 5:00 p.m. Voicemails left after 4:30 p.m. will not be returned until the following business day. _______________________________________________________________  For prescription refill requests, have your pharmacy contact our office. _______________________________________________________________  Recommendations made by the consultant and any test results will be sent to your referring physician. _______________________________________________________________ 

## 2019-11-05 NOTE — Progress Notes (Signed)
Patient presents today for house fluids and possible blood products. Vital signs are stable. Labs pending. Patient states, she is short of breath and experiencing dizziness anytime she tries to move around her home. Patient complains of fatigue that is not relieved by rest. Patient denies pain today.   1 Unit of blood given today per MD orders. 1 Liter of Sodium Chloride 0.9% 1,000 ml with Potassium Chloride 20 mEq , Magnesium Sulfate 2 Grams. Tolerated infusion without adverse affects. Vital signs stable. No complaints at this time. Discharged from clinic via wheel chair.  F/U with [facility] as scheduled.

## 2019-11-06 LAB — TYPE AND SCREEN
ABO/RH(D): O POS
Antibody Screen: NEGATIVE
Unit division: 0

## 2019-11-06 LAB — BPAM RBC
Blood Product Expiration Date: 202106052359
ISSUE DATE / TIME: 202105071020
Unit Type and Rh: 5100

## 2019-11-12 LAB — CA 125: Cancer Antigen (CA) 125: 20.9 U/mL (ref 0.0–38.1)

## 2019-11-15 ENCOUNTER — Other Ambulatory Visit: Payer: Self-pay

## 2019-11-15 ENCOUNTER — Ambulatory Visit (HOSPITAL_COMMUNITY)
Admission: RE | Admit: 2019-11-15 | Discharge: 2019-11-15 | Disposition: A | Discharge status: Home / Self Care | Payer: Medicare Other | Source: Ambulatory Visit | Attending: Hematology | Admitting: Hematology

## 2019-11-15 ENCOUNTER — Other Ambulatory Visit (HOSPITAL_COMMUNITY): Payer: Medicare Other

## 2019-11-15 DIAGNOSIS — C541 Malignant neoplasm of endometrium: Secondary | ICD-10-CM | POA: Diagnosis not present

## 2019-11-15 MED ORDER — IOHEXOL 300 MG/ML  SOLN
100.0000 mL | Freq: Once | INTRAMUSCULAR | Status: AC | PRN
  Administered 2019-11-15: 100 mL via INTRAVENOUS

## 2019-11-16 ENCOUNTER — Other Ambulatory Visit (HOSPITAL_COMMUNITY): Payer: Self-pay | Admitting: Hematology

## 2019-11-22 ENCOUNTER — Inpatient Hospital Stay (HOSPITAL_COMMUNITY): Payer: Medicare Other

## 2019-11-22 ENCOUNTER — Inpatient Hospital Stay (HOSPITAL_BASED_OUTPATIENT_CLINIC_OR_DEPARTMENT_OTHER): Payer: Medicare Other | Admitting: Hematology

## 2019-11-22 ENCOUNTER — Other Ambulatory Visit: Payer: Self-pay

## 2019-11-22 VITALS — BP 129/57 | HR 78 | Temp 96.6°F | Resp 18 | Wt 262.0 lb

## 2019-11-22 DIAGNOSIS — G629 Polyneuropathy, unspecified: Secondary | ICD-10-CM | POA: Diagnosis not present

## 2019-11-22 DIAGNOSIS — G620 Drug-induced polyneuropathy: Secondary | ICD-10-CM | POA: Diagnosis not present

## 2019-11-22 DIAGNOSIS — C541 Malignant neoplasm of endometrium: Secondary | ICD-10-CM

## 2019-11-22 DIAGNOSIS — R0602 Shortness of breath: Secondary | ICD-10-CM | POA: Diagnosis not present

## 2019-11-22 DIAGNOSIS — C50412 Malignant neoplasm of upper-outer quadrant of left female breast: Secondary | ICD-10-CM | POA: Diagnosis not present

## 2019-11-22 DIAGNOSIS — D649 Anemia, unspecified: Secondary | ICD-10-CM | POA: Diagnosis not present

## 2019-11-22 DIAGNOSIS — E876 Hypokalemia: Secondary | ICD-10-CM | POA: Diagnosis not present

## 2019-11-22 DIAGNOSIS — Z170 Estrogen receptor positive status [ER+]: Secondary | ICD-10-CM | POA: Diagnosis not present

## 2019-11-22 LAB — CBC WITH DIFFERENTIAL/PLATELET
Abs Immature Granulocytes: 0.01 10*3/uL (ref 0.00–0.07)
Basophils Absolute: 0 10*3/uL (ref 0.0–0.1)
Basophils Relative: 1 %
Eosinophils Absolute: 0.1 10*3/uL (ref 0.0–0.5)
Eosinophils Relative: 2 %
HCT: 28.5 % — ABNORMAL LOW (ref 36.0–46.0)
Hemoglobin: 9 g/dL — ABNORMAL LOW (ref 12.0–15.0)
Immature Granulocytes: 0 %
Lymphocytes Relative: 34 %
Lymphs Abs: 1.6 10*3/uL (ref 0.7–4.0)
MCH: 33.5 pg (ref 26.0–34.0)
MCHC: 31.6 g/dL (ref 30.0–36.0)
MCV: 105.9 fL — ABNORMAL HIGH (ref 80.0–100.0)
Monocytes Absolute: 0.7 10*3/uL (ref 0.1–1.0)
Monocytes Relative: 14 %
Neutro Abs: 2.3 10*3/uL (ref 1.7–7.7)
Neutrophils Relative %: 49 %
Platelets: 265 10*3/uL (ref 150–400)
RBC: 2.69 MIL/uL — ABNORMAL LOW (ref 3.87–5.11)
RDW: 21.2 % — ABNORMAL HIGH (ref 11.5–15.5)
WBC: 4.7 10*3/uL (ref 4.0–10.5)
nRBC: 0 % (ref 0.0–0.2)

## 2019-11-22 MED ORDER — PREGABALIN 50 MG PO CAPS
50.0000 mg | ORAL_CAPSULE | Freq: Two times a day (BID) | ORAL | 2 refills | Status: DC
Start: 2019-11-22 — End: 2020-01-10

## 2019-11-22 NOTE — Progress Notes (Signed)
[address], [place] [address]   CLINIC:  Medical Oncology/Hematology  PCP:  [name], MD [place] [address] [phone]   REASON FOR VISIT:  Follow-up for endometrioid adenocarcinoma.  CURRENT THERAPY: Surveillance.  BRIEF ONCOLOGIC HISTORY:  Oncology History  Breast cancer ([facility])  12/08/2013 Procedure   Left needle core biopsy   12/09/2013 Pathology Results   Invasive ductal carcinoma with DCIS, ER 100%, PR 31%, Ki-67 marker 4%.  Insufficient material for HER2 testing.   12/28/2013 Breast MRI   Post biopsy changes located within the left breast laterally (upper-outer quadrant) related to the patient's recent stereotactic biopsy. Also, postsurgical scarring changes within the left breast. No areas of worrisome enhancement within the right breast.   01/26/2014 Definitive Surgery   Left modified radical mastectomy by Dr. Arnoldo Morale   02/01/2014 Pathology Results   Intermediate grade DCIS, 0.5 cm, 0/6 lymph nodes, negative resection margins.   03/13/2016 Imaging   Bone density- BMD as determined from Femur Neck Right is 1.023 g/cm2 with a T-Score of -0.1. This patient is considered normal according to [facility]) criteria.   Endometrial cancer ([facility])  04/22/2019 Initial Biopsy   EMB - Gr1 EMCA   04/28/2019 Initial Diagnosis   Endometrial cancer determined by uterine biopsy ([facility])   05/18/2019 Surgery   TRH/BSO, bilateral SLNs SLNs bulky.    Pathologic Stage   IIIC1, +LVSI, outer half MI, endometrioid adenoca, G 1-2 MSI-H   07/05/2019 -  Chemotherapy   The patient had palonosetron (ALOXI) injection 0.25 mg, 0.25 mg, Intravenous,  Once, 6 of 6 cycles Administration: 0.25 mg (07/05/2019), 0.25 mg (07/26/2019), 0.25 mg (08/16/2019), 0.25 mg (09/13/2019), 0.25 mg (10/04/2019), 0.25 mg (10/25/2019) pegfilgrastim-jmdb (FULPHILA) injection 6 mg, 6 mg, Subcutaneous,  Once, 6 of 6 cycles Administration:  6 mg (07/07/2019), 6 mg (07/28/2019), 6 mg (08/18/2019), 6 mg (09/15/2019), 6 mg (10/06/2019), 6 mg (10/27/2019) CARBOplatin (PARAPLATIN) 750 mg in sodium chloride 0.9 % 250 mL chemo infusion, 750 mg (100 % of original dose 745.2 mg), Intravenous,  Once, 6 of 6 cycles Dose modification:   (original dose 745.2 mg, Cycle 1),   (original dose 745.2 mg, Cycle 2),   (original dose 745.2 mg, Cycle 3),   (original dose 745.2 mg, Cycle 4),   (original dose 745.2 mg, Cycle 5),   (original dose 745.2 mg, Cycle 6) Administration: 750 mg (07/05/2019), 750 mg (07/26/2019), 750 mg (08/16/2019), 750 mg (09/13/2019), 750 mg (10/04/2019), 750 mg (10/25/2019) fosaprepitant (EMEND) 150 mg in sodium chloride 0.9 % 145 mL IVPB, 150 mg, Intravenous,  Once, 6 of 6 cycles Administration: 150 mg (07/05/2019), 150 mg (07/26/2019), 150 mg (08/16/2019), 150 mg (09/13/2019), 150 mg (10/04/2019), 150 mg (10/25/2019) PACLitaxel (TAXOL) 414 mg in sodium chloride 0.9 % 500 mL chemo infusion (> 32m/m2), 175 mg/m2 = 414 mg, Intravenous,  Once, 6 of 6 cycles Administration: 414 mg (07/05/2019), 414 mg (07/26/2019), 414 mg (08/16/2019), 414 mg (09/13/2019), 414 mg (10/04/2019), 414 mg (10/25/2019)  for chemotherapy treatment.       CANCER STAGING: Cancer Staging Breast cancer ([name] Staging form: Breast, AJCC 7th Edition - Clinical stage from 02/01/2014: Stage 0 (Tis (DCIS), N0, M0) - Signed by KBaird Cancer PA-C on 12/01/2015  Endometrial cancer ([facility] Staging form: Corpus Uteri - Carcinoma and Carcinosarcoma, AJCC 8th Edition - Clinical stage from 05/18/2019: FIGO Stage IIIC1 (cT1b, cN1a(sn)) - Unsigned    INTERVAL HISTORY:  Ms. [id number]. female  seen for follow-up of endometrial cancer.  She has finished her chemotherapy on 10/25/2019.  Last blood transfusion was on 11/05/2019.  Reportedly completed radiation therapy on 10/28/2019.  Complains of feeling very tired with low energy levels.  Appetite is 75%.  Reports numbness in the toes, with occasional  pins-and-needles sensation.  Sometimes it keeps her awake at night.  Shortness of breath on exertion present.   REVIEW OF SYSTEMS:  Review of Systems  Respiratory: Positive for shortness of breath.   Neurological: Positive for numbness.  All other systems reviewed and are negative.    PAST MEDICAL/SURGICAL HISTORY:  Past Medical History:  Diagnosis Date  . Ankle pain   . Anxiety   . Arthritis   . Asthma    Seasonal asthma  . Breast cancer (HCC) 2008   ILC of Left breast; ER+  . 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 Depression   . Endometrial cancer (HCC)   . GERD (gastroesophageal reflux disease)   . H/O hiatal hernia 2003  . Hepatitis    history in early 20's.  . History of atrial fibrillation 2011  . History of blood transfusion   . History of hepatitis A    Age 16  . History of pneumonia   . Hyperactive gag reflex   . Hyperlipidemia   . Hypertension   . Knee pain   . Obesity, Class III, BMI 40-49.9 (morbid obesity) (HCC)   . OSA on CPAP   . Pneumonia   . Port-A-Cath in place 06/28/2019  . Sciatica   . Vertigo    Past Surgical History:  Procedure Laterality Date  . BREAST LUMPECTOMY Left 1982,2008   x 2   . CHOLECYSTECTOMY N/A 12/31/2016   Procedure: LAPAROSCOPIC CHOLECYSTECTOMY WITH POSSIBLE INTRAOPERATIVE CHOLANGIOGRAM;  Surgeon: Wakefield, Matthew, MD;  Location: [facility] OR;  Service: General;  Laterality: N/A;  . COLONOSCOPY    . EYE SURGERY     bil cataract surgery  . HIATAL HERNIA REPAIR  2003  . Lymph node removal  2008  . MASTECTOMY MODIFIED RADICAL Left 01/26/2014   Procedure: LEFT MODIFIED RADICAL MASTECTOMY;  Surgeon: Mark A Jenkins, MD;  Location: [facility] ORS;  Service: General;  Laterality: Left;  . PORT-A-CATH REMOVAL  2009 ish  . PORTA CATH INSERTION  2008ish  . PORTACATH PLACEMENT Right 06/23/2019   Procedure: INSERTION PORT-A-CATH;  Surgeon: Jenkins, Mark, MD;  Location: [facility] ORS;  Service: General;   Laterality: Right;  . ROBOTIC ASSISTED TOTAL HYSTERECTOMY WITH BILATERAL SALPINGO OOPHERECTOMY N/A 05/18/2019   Procedure: XI ROBOTIC ASSISTED TOTAL HYSTERECTOMY WITH BILATERAL SALPINGO OOPHORECTOMY;  Surgeon: Tucker, Katherine R, MD;  Location: [facility] ORS;  Service: Gynecology;  Laterality: N/A;  . SENTINEL NODE BIOPSY N/A 05/18/2019   Procedure: SENTINEL LYMPH NODE BIOPSY;  Surgeon: Tucker, Katherine R, MD;  Location: [facility] ORS;  Service: Gynecology;  Laterality: N/A;  . TOTAL KNEE ARTHROPLASTY Right 2006  . TOTAL KNEE ARTHROPLASTY  08/12/2011   Procedure: TOTAL KNEE ARTHROPLASTY;  Surgeon: Stanley Harrison, MD;  Location: [facility] ORS;  Service: Orthopedics;  Laterality: Left;  . TUBAL LIGATION  1980     SOCIAL HISTORY:  Social History   Socioeconomic History  . Marital status: Widowed    Spouse name: Not on file  . Number of children: 2  . Years of education: 12  . Highest education level: Not on file  Occupational History  . Occupation: retired  Tobacco Use  . Smoking   status: Former Smoker    Packs/day: 3.00    Years: 25.00    Pack years: 75.00    Types: Cigarettes    Quit date: 06/30/1997    Years since quitting: 22.4  . Smokeless tobacco: Never Used  Substance and Sexual Activity  . Alcohol use: Yes    Alcohol/week: 0.0 standard drinks    Comment: 2 glasses wine per month  . Drug use: No  . Sexual activity: Not Currently    Birth control/protection: Surgical    Comment: tubal  Other Topics Concern  . Not on file  Social History Narrative   Drinks 2-3 cups of coffee a day    Social Determinants of Health   Financial Resource Strain:   . Difficulty of Paying Living Expenses:   Food Insecurity:   . Worried About Running Out of Food in the Last Year:   . Ran Out of Food in the Last Year:   Transportation Needs:   . Lack of Transportation (Medical):   . Lack of Transportation (Non-Medical):   Physical Activity:   . Days of Exercise per Week:   . Minutes of Exercise per  Session:   Stress:   . Feeling of Stress :   Social Connections:   . Frequency of Communication with Friends and Family:   . Frequency of Social Gatherings with Friends and Family:   . Attends Religious Services:   . Active Member of Clubs or Organizations:   . Attends Club or Organization Meetings:   . Marital Status:   Intimate Partner Violence:   . Fear of Current or Ex-Partner:   . Emotionally Abused:   . Physically Abused:   . Sexually Abused:     FAMILY HISTORY:  Family History  Problem Relation Age of Onset  . Stroke Father   . Heart disease Father   . Skin cancer Sister        dx. 57-58; unknown type  . Throat cancer Maternal Uncle 75       smoker; voicebox removed  . Alzheimer's disease Paternal Grandmother   . Heart attack Paternal Grandfather   . Alzheimer's disease Maternal Aunt   . Heart disease Maternal Uncle   . Lung cancer Cousin        smoker  . Lung cancer Paternal Aunt        dx. mid-70s; worked at tobacco company  . Heart attack Daughter        caused by thyroid issues  . Heart disease Other   . Arthritis Other   . Lung disease Other   . Cancer Other   . Kidney disease Other   . Colon cancer Other   . Ovarian cancer Other   . Endometrial cancer Other     CURRENT MEDICATIONS:  Outpatient Encounter Medications as of 11/22/2019  Medication Sig  . apixaban (ELIQUIS) 5 MG TABS tablet Take 1 tablet (5 mg total) by mouth 2 (two) times daily.  . buPROPion (WELLBUTRIN XL) 300 MG 24 hr tablet Take 300 mg by mouth daily.  . Calcium Carbonate-Vit D-Min (CALCIUM 1200 PO) Take 1 tablet by mouth daily.   . furosemide (LASIX) 40 MG tablet TAKE 1 TABLET TWICE A DAY  . MAGNESIUM-OXIDE 400 (241.3 Mg) MG tablet TAKE 2 TABLETS(800 MG) BY MOUTH TWICE DAILY  . metoprolol tartrate (LOPRESSOR) 25 MG tablet TAKE 1 TABLET TWICE A DAY  . omeprazole (PRILOSEC) 20 MG capsule Take 20 mg by mouth 2 (two) times daily.   . potassium   chloride SA (KLOR-CON M20) 20 MEQ tablet  Take 2 tablets (40 mEq total) by mouth every morning. & 20 meq in the evening (Patient taking differently: Take 20 mEq by mouth 2 (two) times daily. )  . rosuvastatin (CRESTOR) 20 MG tablet Take 20 mg by mouth at bedtime.   . sertraline (ZOLOFT) 100 MG tablet Take 100 mg by mouth at bedtime.  . albuterol (PROVENTIL) (2.5 MG/3ML) 0.083% nebulizer solution Take 3 mLs by nebulization 4 (four) times daily as needed for wheezing or shortness of breath.   . albuterol (VENTOLIN HFA) 108 (90 Base) MCG/ACT inhaler Inhale 1-2 puffs into the lungs every 6 (six) hours as needed for wheezing or shortness of breath.  . CARBOPLATIN IV Inject into the vein every 21 ( twenty-one) days.  . lidocaine-prilocaine (EMLA) cream Apply a small amount to port a cath site and cover with plastic wrap 1 hour prior to chemotherapy appointments (Patient not taking: Reported on 11/22/2019)  . PACLITAXEL IV Inject into the vein every 21 ( twenty-one) days.  . pregabalin (LYRICA) 50 MG capsule Take 1 capsule (50 mg total) by mouth 2 (two) times daily.  . prochlorperazine (COMPAZINE) 10 MG tablet Take 1 tablet (10 mg total) by mouth every 6 (six) hours as needed (Nausea or vomiting). (Patient not taking: Reported on 11/22/2019)  . traMADol (ULTRAM) 50 MG tablet Take 1 tablet (50 mg total) by mouth every 6 (six) hours as needed for severe pain. (Patient not taking: Reported on 11/22/2019)  . [DISCONTINUED] loratadine (CLARITIN) 10 MG tablet Take 10 mg by mouth daily.   Facility-Administered Encounter Medications as of 11/22/2019  Medication  . heparin lock flush 100 unit/mL  . sodium chloride flush (NS) 0.9 % injection 10 mL    ALLERGIES:  No Known Allergies   PHYSICAL EXAM:  ECOG Performance status: 1  Vitals:   11/22/19 1455  BP: (!) 129/57  Pulse: 78  Resp: 18  Temp: (!) 96.6 F (35.9 C)  SpO2: 97%   Filed Weights   11/22/19 1455  Weight: 262 lb (118.8 kg)    Physical Exam Vitals reviewed.  Constitutional:       Appearance: Normal appearance.  Cardiovascular:     Rate and Rhythm: Normal rate and regular rhythm.     Heart sounds: Normal heart sounds.  Pulmonary:     Effort: Pulmonary effort is normal.     Breath sounds: Normal breath sounds.  Abdominal:     General: There is no distension.     Palpations: Abdomen is soft. There is no mass.  Musculoskeletal:        General: No swelling.  Skin:    General: Skin is warm.  Neurological:     General: No focal deficit present.     Mental Status: She is alert and oriented to person, place, and time.  Psychiatric:        Mood and Affect: Mood normal.        Behavior: Behavior normal.      LABORATORY DATA:  I have reviewed the labs as listed.  CBC    Component Value Date/Time   WBC 4.7 11/22/2019 1540   RBC 2.69 (L) 11/22/2019 1540   HGB 9.0 (L) 11/22/2019 1540   HCT 28.5 (L) 11/22/2019 1540   PLT 265 11/22/2019 1540   MCV 105.9 (H) 11/22/2019 1540   MCH 33.5 11/22/2019 1540   MCHC 31.6 11/22/2019 1540   RDW 21.2 (H) 11/22/2019 1540   LYMPHSABS PENDING 11/22/2019 1540     MONOABS PENDING 11/22/2019 1540   EOSABS PENDING 11/22/2019 1540   BASOSABS PENDING 11/22/2019 1540   CMP Latest Ref Rng & Units 11/05/2019 10/25/2019 10/07/2019  Glucose 70 - 99 mg/dL 152(H) 170(H) 142(H)  BUN 8 - 23 mg/dL _0 Creatinine 0.44 - 1.00 mg/dL 0.77 0.86 0.71  Sodium 135 - 145 mmol/L 138 137 137  Potassium 3.5 - 5.1 mmol/L 3.8 3.9 3.5  Chloride 98 - 111 mmol/L 100 99 99  CO2 22 - 32 mmol/L _1 Calcium 8.9 - 10.3 mg/dL 8.6(L) 8.8(L) 8.6(L)  Total Protein 6.5 - 8.1 g/dL 6.6 6.5 6.3(L)  Total Bilirubin 0.3 - 1.2 mg/dL 0.7 0.5 0.9  Alkaline Phos 38 - 126 U/L 98 91 84  AST 15 - 41 U/L _2 ALT 0 - 44 U/L _3 DIAGNOSTIC IMAGING:  I have reviewed scans.     ASSESSMENT & PLAN:   Endometrial cancer ([place]) 1.  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 -6 cycles of carboplatin and  paclitaxel from 07/05/2019 through 10/25/2019. -CTAP on 11/15/2019 did not show any evidence of recurrence or metastatic disease. -CA-125 on 11/05/2019 was 20.9. -Vaginal brachytherapy from 09/21/2019, 5 treatments completed on 10/28/2019. -I have recommended follow-up with Dr. Berline Lopes once every 6 months. -I plan to repeat CT scan in 6 months.  2.  Hypomagnesemia: -Magnesium level was 1.3 on 11/05/2019.  She will continue magnesium 3 times a day.  We will check her magnesium in 6 weeks.  3.  Hypokalemia: -Potassium was 3.8.  Continue potassium 20 mEq twice daily.  4.  Peripheral neuropathy: -She reports pins-and-needles sensation in the feet which sometimes wakes her up.  She has tried gabapentin but could not tolerate secondary to confusion. -We will start her on Lyrica 50 mg twice daily and titrate up as needed.  5.  Macrocytic anemia: -Last blood transfusion was on 11/05/2019 for hemoglobin of 7.4.  She received 1 unit of PRBC and felt better. -Today she complains of feeling very tired.  I have checked a CBC and her hemoglobin was 9.  I plan to repeat her labs in 6 weeks.     Orders placed this encounter:  Orders Placed This Encounter  Procedures  . CBC with Differential/Platelet  . CBC with Differential/Platelet  . Comprehensive metabolic panel  . Magnesium  . Sample to Blood Bank      Derek Jack, [place] [phone]

## 2019-11-22 NOTE — Assessment & Plan Note (Signed)
1.  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 -6 cycles of carboplatin and paclitaxel from 07/05/2019 through 10/25/2019. -CTAP on 11/15/2019 did not show any evidence of recurrence or metastatic disease. -CA-125 on 11/05/2019 was 20.9. -Vaginal brachytherapy from 09/21/2019, 5 treatments completed on 10/28/2019. -I have recommended follow-up with Dr. Berline Lopes once every 6 months. -I plan to repeat CT scan in 6 months.  2.  Hypomagnesemia: -Magnesium level was 1.3 on 11/05/2019.  She will continue magnesium 3 times a day.  We will check her magnesium in 6 weeks.  3.  Hypokalemia: -Potassium was 3.8.  Continue potassium 20 mEq twice daily.  4.  Peripheral neuropathy: -She reports pins-and-needles sensation in the feet which sometimes wakes her up.  She has tried gabapentin but could not tolerate secondary to confusion. -We will start her on Lyrica 50 mg twice daily and titrate up as needed.  5.  Macrocytic anemia: -Last blood transfusion was on 11/05/2019 for hemoglobin of 7.4.  She received 1 unit of PRBC and felt better. -Today she complains of feeling very tired.  I have checked a CBC and her hemoglobin was 9.  I plan to repeat her labs in 6 weeks.

## 2019-11-22 NOTE — Patient Instructions (Addendum)
[place] at [facility] Discharge Instructions  You were seen today by Dr. Delton Coombes. He went over your recent lab results. He will send in a new prescription for Lyrica to your pharmacy. Continue taking your magnesium as prescribed. He will see you back in 6 weeks for labs and follow up.   Thank you for choosing [place] at [facility] to provide your oncology and hematology care.  To afford each patient quality time with our provider, please arrive at least 15 minutes before your scheduled appointment time.   If you have a lab appointment with the [place] please come in thru the  Main Entrance and check in at the main information desk  You need to re-schedule your appointment should you arrive 10 or more minutes late.  We strive to give you quality time with our providers, and arriving late affects you and other patients whose appointments are after yours.  Also, if you no show three or more times for appointments you may be dismissed from the clinic at the providers discretion.     Again, thank you for choosing [facility].  Our hope is that these requests will decrease the amount of time that you wait before being seen by our physicians.       _____________________________________________________________  Should you have questions after your visit to [facility], please contact our office at [phone] between the hours of 8:00 a.m. and 4:30 p.m.  Voicemails left after 4:00 p.m. will not be returned until the following business day.  For prescription refill requests, have your pharmacy contact our office and allow 72 hours.    Cancer Center Support Programs:   > Cancer Support Group  2nd Tuesday of the month 1pm-2pm, Journey Room

## 2019-11-24 NOTE — Progress Notes (Incomplete)
  Patient Name: Stephanie Jarvis MRN: [id number] DOB: 24-Jun-1951 Referring Physician: [name] (Profile Not Attached) Date of Service: 10/28/2019 [facility], [place]                                                        End Of Treatment Note  Diagnoses: C54.1-Malignant neoplasm of endometrium  Cancer Staging: Stage IIIC (pT1b, pN1a) Endometrioid Adenocarcinoma  Intent: Curative  Radiation Treatment Dates: 09/21/2019 through 10/28/2019 Site Technique Total Dose (Gy) Dose per Fx (Gy) Completed Fx Beam Energies  Vagina: Pelvis HDR-brachy 30/30 6 5/5 [id number]   Narrative: The patient tolerated radiation therapy relatively well. She did not report any radiation-related toxicities. Of note, she completed her adjuvant chemotherapy in addition to radiation therapy.  Plan: The patient will follow-up with radiation oncology in one month.  ________________________________________________   Blair Promise, PhD, MD  This document serves as a record of services personally performed by Gery Pray, MD. It was created on his behalf by Clerance Lav, a trained medical scribe. The creation of this record is based on the scribe's personal observations and the provider's statements to them. This document has been checked and approved by the attending provider.

## 2019-11-25 DIAGNOSIS — Z1331 Encounter for screening for depression: Secondary | ICD-10-CM | POA: Diagnosis not present

## 2019-11-25 DIAGNOSIS — F339 Major depressive disorder, recurrent, unspecified: Secondary | ICD-10-CM | POA: Diagnosis not present

## 2019-11-25 DIAGNOSIS — G629 Polyneuropathy, unspecified: Secondary | ICD-10-CM | POA: Diagnosis not present

## 2019-11-25 DIAGNOSIS — Z1389 Encounter for screening for other disorder: Secondary | ICD-10-CM | POA: Diagnosis not present

## 2019-11-25 DIAGNOSIS — C541 Malignant neoplasm of endometrium: Secondary | ICD-10-CM | POA: Diagnosis not present

## 2019-12-01 NOTE — Progress Notes (Signed)
Radiation Oncology         [phone] ________________________________  Name: Stephanie Jarvis MRN: [id number]  Date: 12/02/2019  DOB: [birth year]  Follow-Up Visit Note  CC: [name], MD  [name], MD    ICD-10-CM   1. Endometrial cancer (HCC)  C54.1     Diagnosis: Stage IIIC (pT1b, pN1a) Endometrioid Adenocarcinoma  Interval Since Last Radiation: One month and five days.  Radiation Treatment Dates: 09/21/2019 through 10/28/2019 Site Technique Total Dose (Gy) Dose per Fx (Gy) Completed Fx Beam Energies  Vagina: Pelvis HDR-brachy 30/30 6 5/5 Ir-192    Narrative:  The patient returns today for routine follow-up. Since the end of treatment, she underwent a CT scan of abdomen/pelvis on 11/15/2019. Results did not show any evidence of recurrent or metastatic carcinoma within the abdomen or pelvis. There was a stable small hiatal hernia noted in addition to mild sigmoid diverticulosis without radiographic evidence of diverticulitis or other acute findings.  She was seen by Dr. Delton Coombes on 11/22/2019, during which time he recommended follow-up with Dr. Berline Lopes once every six months in addition to a repeat CT of abdomen/pelvis in six months. Of note, she did have a blood transfusion with one unit of PRBC on 11/05/2019 for hemoglobin of 7.4. Her most recent hemoglobin was 9; labs will be repeated when she sees Dr. Delton Coombes next month.  On review of systems, she reports mild fatigue. She denies pain, vaginal bleeding/discharge, rectal bleeding, nausea, vomiting, and diarrhea.  ALLERGIES:  has No Known Allergies.  Meds: Current Outpatient Medications  Medication Sig Dispense Refill   albuterol (PROVENTIL) (2.5 MG/3ML) 0.083% nebulizer solution Take 3 mLs by nebulization 4 (four) times daily as needed for wheezing or shortness of breath.      albuterol (VENTOLIN HFA) 108 (90 Base) MCG/ACT inhaler Inhale 1-2 puffs into the lungs every 6 (six) hours as needed for wheezing or  shortness of breath.     apixaban (ELIQUIS) 5 MG TABS tablet Take 1 tablet (5 mg total) by mouth 2 (two) times daily. 180 tablet 3   buPROPion (WELLBUTRIN XL) 300 MG 24 hr tablet Take 300 mg by mouth daily.     Calcium Carbonate-Vit D-Min (CALCIUM 1200 PO) Take 1 tablet by mouth daily.      CARBOPLATIN IV Inject into the vein every 21 ( twenty-one) days.     furosemide (LASIX) 40 MG tablet TAKE 1 TABLET TWICE A DAY 180 tablet 1   lidocaine-prilocaine (EMLA) cream Apply a small amount to port a cath site and cover with plastic wrap 1 hour prior to chemotherapy appointments (Patient not taking: Reported on 11/22/2019) 30 g 3   MAGNESIUM-OXIDE 400 (241.3 Mg) MG tablet TAKE 2 TABLETS(800 MG) BY MOUTH TWICE DAILY 120 tablet 2   metoprolol tartrate (LOPRESSOR) 25 MG tablet TAKE 1 TABLET TWICE A DAY 180 tablet 1   omeprazole (PRILOSEC) 20 MG capsule Take 20 mg by mouth 2 (two) times daily.      PACLITAXEL IV Inject into the vein every 21 ( twenty-one) days.     potassium chloride SA (KLOR-CON M20) 20 MEQ tablet Take 2 tablets (40 mEq total) by mouth every morning. & 20 meq in the evening (Patient taking differently: Take 20 mEq by mouth 2 (two) times daily. ) 180 tablet 3   pregabalin (LYRICA) 50 MG capsule Take 1 capsule (50 mg total) by mouth 2 (two) times daily. 60 capsule 2   prochlorperazine (COMPAZINE) 10 MG tablet Take 1 tablet (10 mg  total) by mouth every 6 (six) hours as needed (Nausea or vomiting). (Patient not taking: Reported on 11/22/2019) 30 tablet 1   rosuvastatin (CRESTOR) 20 MG tablet Take 20 mg by mouth at bedtime.      sertraline (ZOLOFT) 100 MG tablet Take 100 mg by mouth at bedtime.     traMADol (ULTRAM) 50 MG tablet Take 1 tablet (50 mg total) by mouth every 6 (six) hours as needed for severe pain. (Patient not taking: Reported on 11/22/2019) 30 tablet 0   No current facility-administered medications for this encounter.   Facility-Administered Medications Ordered in  Other Encounters  Medication Dose Route Frequency Provider Last Rate Last Admin   heparin lock flush 100 unit/mL  500 Units Intracatheter Daily PRN Derek Jack, MD       sodium chloride flush (NS) 0.9 % injection 10 mL  10 mL Intracatheter PRN Derek Jack, MD        Physical Findings: The patient is in no acute distress. Patient is alert and oriented.  height is 5\' 6"  (1.676 m). Her temporal temperature is 97 F (36.1 C) (abnormal). Her blood pressure is 129/63 and her pulse is 73. Her respiration is 18 and oxygen saturation is 98%.   No significant changes. Lungs are clear to auscultation bilaterally. Heart has regular rate and rhythm. No palpable cervical, supraclavicular, or axillary adenopathy. Abdomen soft, non-tender, normal bowel sounds. Pelvic exam deferred in light of recent radiation therapy.   Lab Findings: Lab Results  Component Value Date   WBC 4.7 11/22/2019   HGB 9.0 (L) 11/22/2019   HCT 28.5 (L) 11/22/2019   MCV 105.9 (H) 11/22/2019   PLT 265 11/22/2019    Radiographic Findings: CT Abdomen Pelvis W Contrast  Result Date: 11/15/2019 CLINICAL DATA:  Follow-up metastatic endometrial carcinoma. Status post chemotherapy. EXAM: CT ABDOMEN AND PELVIS WITH CONTRAST TECHNIQUE: Multidetector CT imaging of the abdomen and pelvis was performed using the standard protocol following bolus administration of intravenous contrast. CONTRAST:  168mL OMNIPAQUE IOHEXOL 300 MG/ML  SOLN COMPARISON:  06/02/2019 FINDINGS: Lower Chest: No acute findings. Stable small pericardial effusion. Hepatobiliary: No hepatic masses identified. Stable small cyst at junction of right and left lobes. Prior cholecystectomy. No evidence of biliary obstruction. Pancreas:  No mass or inflammatory changes. Spleen: Within normal limits in size and appearance. Adrenals/Urinary Tract: No masses identified. No evidence of ureteral calculi or hydronephrosis. Stomach/Bowel: Stable small hiatal hernia. No  evidence of obstruction, inflammatory process or abnormal fluid collections. Normal appendix visualized. Mild diverticulosis is seen involving the proximal sigmoid colon, however there is no evidence of diverticulitis. Vascular/Lymphatic: No pathologically enlarged lymph nodes. No abdominal aortic aneurysm. Aortic atherosclerosis noted. Reproductive: Prior hysterectomy noted. Adnexal regions are unremarkable in appearance. Other:  No evidence peritoneal thickening, nodularity, or ascites. Musculoskeletal:  No suspicious bone lesions identified. IMPRESSION: 1. No evidence of recurrent or metastatic carcinoma within the abdomen or pelvis. 2. Stable small hiatal hernia. 3. Mild sigmoid diverticulosis. No radiographic evidence of diverticulitis or other acute findings. Aortic Atherosclerosis (ICD10-I70.0). Electronically Signed   By: Marlaine Hind M.D.   On: 11/15/2019 09:20   MM 3D SCREEN BREAST UNI RIGHT  Result Date: 12/02/2019 CLINICAL DATA:  Screening. EXAM: DIGITAL SCREENING UNILATERAL RIGHT MAMMOGRAM WITH CAD AND TOMO COMPARISON:  Previous exam(s). ACR Breast Density Category c: The breast tissue is heterogeneously dense, which may obscure small masses. FINDINGS: The patient has had a left mastectomy. There are no findings suspicious for malignancy. Images were processed with CAD. IMPRESSION:  No mammographic evidence of malignancy. A result letter of this screening mammogram will be mailed directly to the patient. RECOMMENDATION: Screening mammogram in one year.  (Code:[id number]) BI-RADS CATEGORY  1: Negative. Electronically Signed   By: Margarette Canada M.D.   On: 12/02/2019 14:23    Impression: Stage IIIC (pT1b, pN1a) Endometrioid Adenocarcinoma  The patient tolerated her vaginal brachytherapy well without any immediate side effects.  Recent scans showed no evidence of recurrence  Plan: The patient is scheduled to see Dr. Berline Lopes on 12/24/2019 and Dr. Delton Coombes on 01/10/2020.  The patient reports she will be  moving back to [place] in late summer/ early fall and therefore I have not scheduled her a formal follow-up appointment.  She will talk with Dr. Berline Lopes about arranging gynecologic oncology follow-up with her upcoming visit later this month.  Patient was given a vaginal dilator and instructions on its use.  ____________________________________   Blair Promise, PhD, MD  This document serves as a record of services personally performed by Gery Pray, MD. It was created on his behalf by Clerance Lav, a trained medical scribe. The creation of this record is based on the scribe's personal observations and the provider's statements to them. This document has been checked and approved by the attending provider.

## 2019-12-02 ENCOUNTER — Encounter: Payer: Self-pay | Admitting: Radiation Oncology

## 2019-12-02 ENCOUNTER — Ambulatory Visit (HOSPITAL_COMMUNITY)
Admission: RE | Admit: 2019-12-02 | Discharge: 2019-12-02 | Disposition: A | Discharge status: Home / Self Care | Payer: Medicare Other | Source: Ambulatory Visit | Attending: Nurse Practitioner | Admitting: Nurse Practitioner

## 2019-12-02 ENCOUNTER — Other Ambulatory Visit (HOSPITAL_COMMUNITY): Payer: Medicare Other

## 2019-12-02 ENCOUNTER — Ambulatory Visit
Admission: RE | Admit: 2019-12-02 | Discharge: 2019-12-02 | Disposition: A | Discharge status: Home / Self Care | Payer: Medicare Other | Source: Ambulatory Visit | Attending: Radiation Oncology | Admitting: Radiation Oncology

## 2019-12-02 ENCOUNTER — Other Ambulatory Visit: Payer: Self-pay

## 2019-12-02 DIAGNOSIS — Z8542 Personal history of malignant neoplasm of other parts of uterus: Secondary | ICD-10-CM | POA: Insufficient documentation

## 2019-12-02 DIAGNOSIS — R5383 Other fatigue: Secondary | ICD-10-CM | POA: Insufficient documentation

## 2019-12-02 DIAGNOSIS — Z1239 Encounter for other screening for malignant neoplasm of breast: Secondary | ICD-10-CM | POA: Diagnosis not present

## 2019-12-02 DIAGNOSIS — C541 Malignant neoplasm of endometrium: Secondary | ICD-10-CM

## 2019-12-02 DIAGNOSIS — Z7901 Long term (current) use of anticoagulants: Secondary | ICD-10-CM | POA: Insufficient documentation

## 2019-12-02 DIAGNOSIS — Z923 Personal history of irradiation: Secondary | ICD-10-CM | POA: Insufficient documentation

## 2019-12-02 DIAGNOSIS — Z79899 Other long term (current) drug therapy: Secondary | ICD-10-CM | POA: Insufficient documentation

## 2019-12-02 NOTE — Progress Notes (Addendum)
Patient here for a 1 month f/u visit.   Pain: no   Fatigue: mild  No vaginal or rectal bleeding.  No bladder issues.  No nausea,vomiting or diarrhea.   Finished chemo last month.  BP 129/63 (BP Location: Right Arm, Patient Position: Sitting)   Pulse 73   Temp (!) 97 F (36.1 C) (Temporal)   Resp 18   Ht 5\' 6"  (1.676 m)   SpO2 98%   BMI 42.29 kg/m   Wt Readings from Last 3 Encounters:  11/22/19 262 lb (118.8 kg)  10/25/19 261 lb (118.4 kg)  10/04/19 262 lb 3.2 oz (118.9 kg)     Home Care Instructions for the Insertion and Care of Your Vaginal Dilator  Why Do I Need a Vaginal Dilator?  Internal radiation therapy may cause scar tissue to form at the top of your vagina (vaginal cuff).  This may make vaginal examinations difficult in the future. You can prevent scar tissue from forming by using a vaginal dilator (a smooth plastic rod), and/or by having regular sexual intercourse.  If not using the dilator you should be having intercourse two or three times a week.  If you are unable to have intercourse, you should use your vaginal dilator.  You may have some spotting or bleeding from your dilator or intercourse the first few times. You may also have some discomfort. If discomfort occurs with intercourse, you and your partner may need to stop for a while and try again later.  How to Use Your Vaginal Dilator  - Wash the dilator with soap and water before and after each use. - Check the dilator to be sure it is smooth. Do not use the dilator if you find any roughspots. - Coat the dilator with K-Y Jelly, Astroglide, or Replens. Do not use Vaseline, baby oil, or other oil based lubricants. They are not water-soluble and can be irritating to the tissues in the vagina. - Lie on your back with your knees bent and legs apart. - Insert the rounded end of the dilator into your vagina as far as it will go without causing pain or discomfort. - Close your knees and slowly  straighten your legs. - Keep the dilator in your vagina for about 10 to 15 minutes.  Please use 3 times a week, for example: Monday, Wednesday and Friday evenings. [facility] your knees, open your legs, and gently remove the dilator. - Gently cleanse the skin around the vaginal opening. - Wash the dilator after each use. -  It is important that you use the dilator routinely until instructed otherwise by your doctor.  Patient given an xs and small dilator with lubricant and reviewed above instructions with the patient.

## 2019-12-08 ENCOUNTER — Ambulatory Visit (HOSPITAL_COMMUNITY): Payer: Medicare Other | Admitting: Nurse Practitioner

## 2019-12-14 ENCOUNTER — Encounter: Payer: Self-pay | Admitting: Adult Health

## 2019-12-16 ENCOUNTER — Ambulatory Visit (INDEPENDENT_AMBULATORY_CARE_PROVIDER_SITE_OTHER): Payer: Medicare Other | Admitting: Adult Health

## 2019-12-16 ENCOUNTER — Encounter: Payer: Self-pay | Admitting: Adult Health

## 2019-12-16 ENCOUNTER — Other Ambulatory Visit: Payer: Self-pay

## 2019-12-16 VITALS — BP 133/70 | HR 93 | Ht 66.0 in | Wt 264.0 lb

## 2019-12-16 DIAGNOSIS — G4733 Obstructive sleep apnea (adult) (pediatric): Secondary | ICD-10-CM | POA: Diagnosis not present

## 2019-12-16 DIAGNOSIS — Z9989 Dependence on other enabling machines and devices: Secondary | ICD-10-CM

## 2019-12-16 NOTE — Patient Instructions (Signed)
Continue using CPAP nightly and greater than 4 hours each night °If your symptoms worsen or you develop new symptoms please let us know.  ° °

## 2019-12-16 NOTE — Progress Notes (Signed)
PATIENT: Stephanie Jarvis DOB: 08/29/1950  REASON FOR VISIT: follow up HISTORY FROM: patient  HISTORY OF PRESENT ILLNESS: Today 12/16/19:  Ms. Horn is a 69 year old female with a history of obstructive sleep apnea on CPAP.  Her download indicates that she use her machine nightly for compliance of 100%.  She used her machine greater than 4 hours each night.  On average she uses her machine 10 hours and 9 minutes.  Her residual AHI is 3 on 11 cm of water with EPR 3.  Leak in the 95th percentile is 9.5.  She reports that the CPAP is working well for her.  Reports that she also just finished chemo and radiation and is currently in remission.  HISTORY 12/15/18:  [name] is a 69 year old female with a history of obstructive sleep apnea on CPAP.  Her download indicates that she used her machine 30 out of 30 days for compliance of 100%.  She used her machine every night greater than 4 hours.  On average she uses her machine 8 hours and 26 minutes.  Her residual AHI is 2.6 on 11 cm of water with EPR 3.  She does not have a significant leak.  She states that she continues to see the benefit of using the CPAP machine.  She denies any new issues.   REVIEW OF SYSTEMS: Out of a complete 14 system review of symptoms, the patient complains only of the following symptoms, and all other reviewed systems are negative.  FSS 48 ESS 9  ALLERGIES: No Known Allergies  HOME MEDICATIONS: Outpatient Medications Prior to Visit  Medication Sig Dispense Refill  . albuterol (PROVENTIL) (2.5 MG/3ML) 0.083% nebulizer solution Take 3 mLs by nebulization 4 (four) times daily as needed for wheezing or shortness of breath.     Marland Kitchen albuterol (VENTOLIN HFA) 108 (90 Base) MCG/ACT inhaler Inhale 1-2 puffs into the lungs every 6 (six) hours as needed for wheezing or shortness of breath.    Marland Kitchen apixaban (ELIQUIS) 5 MG TABS tablet Take 1 tablet (5 mg total) by mouth 2 (two) times daily. 180 tablet 3  . buPROPion  (WELLBUTRIN XL) 300 MG 24 hr tablet Take 300 mg by mouth daily.    . Calcium Carbonate-Vit D-Min (CALCIUM 1200 PO) Take 1 tablet by mouth daily.     Marland Kitchen CARBOPLATIN IV Inject into the vein every 21 ( twenty-one) days.    . furosemide (LASIX) 40 MG tablet TAKE 1 TABLET TWICE A DAY 180 tablet 1  . lidocaine-prilocaine (EMLA) cream Apply a small amount to port a cath site and cover with plastic wrap 1 hour prior to chemotherapy appointments 30 g 3  . MAGNESIUM-OXIDE 400 (241.3 Mg) MG tablet TAKE 2 TABLETS(800 MG) BY MOUTH TWICE DAILY 120 tablet 2  . metoprolol tartrate (LOPRESSOR) 25 MG tablet TAKE 1 TABLET TWICE A DAY 180 tablet 1  . omeprazole (PRILOSEC) 20 MG capsule Take 20 mg by mouth 2 (two) times daily.     Marland Kitchen PACLITAXEL IV Inject into the vein every 21 ( twenty-one) days.    . potassium chloride SA (KLOR-CON M20) 20 MEQ tablet Take 2 tablets (40 mEq total) by mouth every morning. & 20 meq in the evening (Patient taking differently: Take 20 mEq by mouth 2 (two) times daily. ) 180 tablet 3  . pregabalin (LYRICA) 50 MG capsule Take 1 capsule (50 mg total) by mouth 2 (two) times daily. 60 capsule 2  . prochlorperazine (COMPAZINE) 10 MG tablet Take 1  tablet (10 mg total) by mouth every 6 (six) hours as needed (Nausea or vomiting). 30 tablet 1  . rosuvastatin (CRESTOR) 20 MG tablet Take 20 mg by mouth at bedtime.     . sertraline (ZOLOFT) 100 MG tablet Take 100 mg by mouth at bedtime.    . traMADol (ULTRAM) 50 MG tablet Take 1 tablet (50 mg total) by mouth every 6 (six) hours as needed for severe pain. 30 tablet 0   Facility-Administered Medications Prior to Visit  Medication Dose Route Frequency Provider Last Rate Last Admin  . heparin lock flush 100 unit/mL  500 Units Intracatheter Daily PRN Derek Jack, MD      . sodium chloride flush (NS) 0.9 % injection 10 mL  10 mL Intracatheter PRN Derek Jack, MD        PAST MEDICAL HISTORY: Past Medical History:  Diagnosis Date  .  Ankle pain   . Anxiety   . Arthritis   . Asthma    Seasonal asthma  . Breast cancer ([place]) 2008   ILC of Left breast; ER+  . 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 Depression   . Endometrial cancer ([place])   . GERD (gastroesophageal reflux disease)   . H/O hiatal hernia 2003  . Hepatitis    history in early 20's.  Marland Kitchen History of atrial fibrillation 2011  . History of blood transfusion   . History of hepatitis A    Age 49  . History of pneumonia   . Hyperactive gag reflex   . Hyperlipidemia   . Hypertension   . Knee pain   . Obesity, Class III, BMI 40-49.9 (morbid obesity) ([place])   . OSA on CPAP   . Pneumonia   . Port-A-Cath in place 06/28/2019  . Sciatica   . Vertigo     PAST SURGICAL HISTORY: Past Surgical History:  Procedure Laterality Date  . BREAST LUMPECTOMY Left 1982,2008   x 2   . CHOLECYSTECTOMY N/A 12/31/2016   Procedure: LAPAROSCOPIC CHOLECYSTECTOMY WITH POSSIBLE INTRAOPERATIVE CHOLANGIOGRAM;  Surgeon: Rolm Bookbinder, MD;  Location: [facility];  Service: General;  Laterality: N/A;  . COLONOSCOPY    . EYE SURGERY     bil cataract surgery  . HIATAL HERNIA REPAIR  2003  . Lymph node removal  2008  . MASTECTOMY MODIFIED RADICAL Left 01/26/2014   Procedure: LEFT MODIFIED RADICAL MASTECTOMY;  Surgeon: Jamesetta So, MD;  Location: [facility];  Service: General;  Laterality: Left;  . PORT-A-CATH REMOVAL  2009 ish  . PORTA CATH INSERTION  [id number]  . PORTACATH PLACEMENT Right 06/23/2019   Procedure: INSERTION PORT-A-CATH;  Surgeon: Aviva Signs, MD;  Location: [facility];  Service: General;  Laterality: Right;  . ROBOTIC ASSISTED TOTAL HYSTERECTOMY WITH BILATERAL SALPINGO OOPHERECTOMY N/A 05/18/2019   Procedure: XI ROBOTIC ASSISTED TOTAL HYSTERECTOMY WITH BILATERAL SALPINGO OOPHORECTOMY;  Surgeon: Lafonda Mosses, MD;  Location: [facility];  Service: Gynecology;  Laterality: N/A;  . SENTINEL NODE BIOPSY N/A 05/18/2019   Procedure:  SENTINEL LYMPH NODE BIOPSY;  Surgeon: Lafonda Mosses, MD;  Location: [facility];  Service: Gynecology;  Laterality: N/A;  . TOTAL KNEE ARTHROPLASTY Right 2006  . TOTAL KNEE ARTHROPLASTY  08/12/2011   Procedure: TOTAL KNEE ARTHROPLASTY;  Surgeon: Arther Abbott, MD;  Location: [facility];  Service: Orthopedics;  Laterality: Left;  . TUBAL LIGATION  1980    FAMILY HISTORY: Family History  Problem Relation Age of Onset  .  Stroke Father   . Heart disease Father   . Skin cancer Sister        dx. 4-58; unknown type  . Throat cancer Maternal Uncle 75       smoker; voicebox removed  . Alzheimer's disease Paternal Grandmother   . Heart attack Paternal Grandfather   . Alzheimer's disease Maternal Aunt   . Heart disease Maternal Uncle   . Lung cancer Cousin        smoker  . Lung cancer Paternal Aunt        dx. mid-70s; worked at [employer]  . Heart attack Daughter        caused by thyroid issues  . Heart disease Other   . Arthritis Other   . Lung disease Other   . Cancer Other   . Kidney disease Other   . Colon cancer Other   . Ovarian cancer Other   . Endometrial cancer Other     SOCIAL HISTORY: Social History   Socioeconomic History  . Marital status: Widowed    Spouse name: Not on file  . Number of children: 2  . Years of education: 45  . Highest education level: Not on file  Occupational History  . Occupation: retired  Tobacco Use  . Smoking status: Former Smoker    Packs/day: 3.00    Years: 25.00    Pack years: 75.00    Types: Cigarettes    Quit date: 06/30/1997    Years since quitting: 22.4  . Smokeless tobacco: Never Used  Vaping Use  . Vaping Use: Never used  Substance and Sexual Activity  . Alcohol use: Yes    Alcohol/week: 0.0 standard drinks    Comment: 2 glasses wine per month  . Drug use: No  . Sexual activity: Not Currently    Birth control/protection: Surgical    Comment: tubal  Other Topics Concern  . Not on file  Social History Narrative    Drinks 2-3 cups of coffee a day    Social Determinants of Health   Financial Resource Strain:   . Difficulty of Paying Living Expenses:   Food Insecurity:   . Worried About Charity fundraiser in the Last Year:   . Arboriculturist in the Last Year:   Transportation Needs:   . Film/video editor (Medical):   Marland Kitchen Lack of Transportation (Non-Medical):   Physical Activity:   . Days of Exercise per Week:   . Minutes of Exercise per Session:   Stress:   . Feeling of Stress :   Social Connections:   . Frequency of Communication with Friends and Family:   . Frequency of Social Gatherings with Friends and Family:   . Attends Religious Services:   . Active Member of Clubs or Organizations:   . Attends Archivist Meetings:   Marland Kitchen Marital Status:   Intimate Partner Violence:   . Fear of Current or Ex-Partner:   . Emotionally Abused:   Marland Kitchen Physically Abused:   . Sexually Abused:       PHYSICAL EXAM  Vitals:   12/16/19 0833  BP: 133/70  Pulse: 93  Weight: 264 lb (119.7 kg)  Height: 5' 6"  (1.676 m)   Body mass index is 42.61 kg/m.  Generalized: Well developed, in no acute distress  Chest: Lungs clear to auscultation bilaterally  Neurological examination  Mentation: Alert oriented to time, place, history taking. Follows all commands speech and language fluent Cranial nerve II-XII: Extraocular movements were  full, visual field were full on confrontational test Head turning and shoulder shrug  were normal and symmetric. Motor: The motor testing reveals 5 over 5 strength of all 4 extremities. Good symmetric motor tone is noted throughout.  Sensory: Sensory testing is intact to soft touch on all 4 extremities. No evidence of extinction is noted.  Gait and station: Gait is normal.    DIAGNOSTIC DATA (LABS, IMAGING, TESTING) - I reviewed patient records, labs, notes, testing and imaging myself where available.  Lab Results  Component Value Date   WBC 4.7 11/22/2019    HGB 9.0 (L) 11/22/2019   HCT 28.5 (L) 11/22/2019   MCV 105.9 (H) 11/22/2019   PLT 265 11/22/2019      Component Value Date/Time   NA 138 11/05/2019 0802   K 3.8 11/05/2019 0802   CL 100 11/05/2019 0802   CO2 27 11/05/2019 0802   GLUCOSE 152 (H) 11/05/2019 0802   BUN 14 11/05/2019 0802   CREATININE 0.77 11/05/2019 0802   CALCIUM 8.6 (L) 11/05/2019 0802   PROT 6.6 11/05/2019 0802   ALBUMIN 3.5 11/05/2019 0802   AST 19 11/05/2019 0802   ALT 20 11/05/2019 0802   ALKPHOS 98 11/05/2019 0802   BILITOT 0.7 11/05/2019 0802   GFRNONAA >60 11/05/2019 0802   GFRAA >60 11/05/2019 0802      ASSESSMENT AND PLAN 69 y.o. year old female  has a past medical history of Ankle pain, Anxiety, Arthritis, Asthma, Breast cancer ([place]) (2008), Breast cancer ([place]) (2015), Depression, Endometrial cancer ([place]), GERD (gastroesophageal reflux disease), H/O hiatal hernia (2003), Hepatitis, History of atrial fibrillation (2011), History of blood transfusion, History of hepatitis A, History of pneumonia, Hyperactive gag reflex, Hyperlipidemia, Hypertension, Knee pain, Obesity, Class III, BMI 40-49.9 (morbid obesity) ([place]), OSA on CPAP, Pneumonia, Port-A-Cath in place (06/28/2019), Sciatica, and Vertigo. here with:  1. OSA on CPAP  - CPAP compliance excellent - Good treatment of AHI  - Encourage patient to use CPAP nightly and > 4 hours each night - F/U in 1 year or sooner if needed   I spent 20 minutes of face-to-face and non-face-to-face time with patient.  This included previsit chart review, lab review, study review, order entry, electronic health record documentation, patient education.  Ward Givens, MSN, NP-C 12/16/2019, 9:35 AM [facility] [address] [place], [place] [address] [phone]

## 2019-12-24 ENCOUNTER — Inpatient Hospital Stay: Payer: Medicare Other | Attending: Gynecologic Oncology | Admitting: Gynecologic Oncology

## 2019-12-24 ENCOUNTER — Other Ambulatory Visit: Payer: Self-pay

## 2019-12-24 VITALS — BP 131/76 | HR 78 | Temp 98.5°F | Resp 18 | Ht 66.0 in | Wt 264.3 lb

## 2019-12-24 DIAGNOSIS — Z923 Personal history of irradiation: Secondary | ICD-10-CM | POA: Diagnosis not present

## 2019-12-24 DIAGNOSIS — Z79899 Other long term (current) drug therapy: Secondary | ICD-10-CM | POA: Diagnosis not present

## 2019-12-24 DIAGNOSIS — M1990 Unspecified osteoarthritis, unspecified site: Secondary | ICD-10-CM | POA: Diagnosis not present

## 2019-12-24 DIAGNOSIS — Z7901 Long term (current) use of anticoagulants: Secondary | ICD-10-CM | POA: Insufficient documentation

## 2019-12-24 DIAGNOSIS — Z9221 Personal history of antineoplastic chemotherapy: Secondary | ICD-10-CM

## 2019-12-24 DIAGNOSIS — G4733 Obstructive sleep apnea (adult) (pediatric): Secondary | ICD-10-CM | POA: Insufficient documentation

## 2019-12-24 DIAGNOSIS — C55 Malignant neoplasm of uterus, part unspecified: Secondary | ICD-10-CM | POA: Diagnosis present

## 2019-12-24 DIAGNOSIS — Z87891 Personal history of nicotine dependence: Secondary | ICD-10-CM | POA: Insufficient documentation

## 2019-12-24 DIAGNOSIS — Z90710 Acquired absence of both cervix and uterus: Secondary | ICD-10-CM | POA: Diagnosis not present

## 2019-12-24 DIAGNOSIS — Z9012 Acquired absence of left breast and nipple: Secondary | ICD-10-CM | POA: Diagnosis not present

## 2019-12-24 DIAGNOSIS — F419 Anxiety disorder, unspecified: Secondary | ICD-10-CM | POA: Insufficient documentation

## 2019-12-24 DIAGNOSIS — F329 Major depressive disorder, single episode, unspecified: Secondary | ICD-10-CM | POA: Insufficient documentation

## 2019-12-24 DIAGNOSIS — Z853 Personal history of malignant neoplasm of breast: Secondary | ICD-10-CM | POA: Diagnosis not present

## 2019-12-24 DIAGNOSIS — E785 Hyperlipidemia, unspecified: Secondary | ICD-10-CM | POA: Insufficient documentation

## 2019-12-24 DIAGNOSIS — I10 Essential (primary) hypertension: Secondary | ICD-10-CM | POA: Insufficient documentation

## 2019-12-24 DIAGNOSIS — I4891 Unspecified atrial fibrillation: Secondary | ICD-10-CM | POA: Insufficient documentation

## 2019-12-24 DIAGNOSIS — K219 Gastro-esophageal reflux disease without esophagitis: Secondary | ICD-10-CM | POA: Diagnosis not present

## 2019-12-24 DIAGNOSIS — Z90722 Acquired absence of ovaries, bilateral: Secondary | ICD-10-CM | POA: Diagnosis not present

## 2019-12-24 DIAGNOSIS — C541 Malignant neoplasm of endometrium: Secondary | ICD-10-CM

## 2019-12-24 NOTE — Progress Notes (Signed)
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  Insufficient material for HER2 testing.   12/28/2013 Breast MRI   Post biopsy changes located within the left breast laterally (upper-outer quadrant) related to the patient's recent stereotactic biopsy. Also, postsurgical scarring changes within the left breast. No areas of worrisome enhancement within the right breast.   01/26/2014 Definitive Surgery   Left modified radical mastectomy by Dr. Arnoldo Morale   02/01/2014 Pathology Results   Intermediate grade DCIS, 0.5 cm, 0/6 lymph nodes, negative resection margins.   03/13/2016 Imaging   Bone density- BMD as determined from Femur Neck Right is 1.023 g/cm2 with a T-Score of -0.1. This patient is considered normal according to [facility]) criteria.   Endometrial cancer ([place])  04/22/2019 Initial Biopsy   EMB - Gr1 EMCA   04/28/2019 Initial Diagnosis   Endometrial cancer determined by uterine biopsy ([place])   05/18/2019 Surgery   TRH/BSO, bilateral SLNs SLNs bulky.    Pathologic Stage   IIIC1, +LVSI, outer half MI, endometrioid adenoca, G 1-2 MSI-H   07/05/2019 -  Chemotherapy   The patient had palonosetron (ALOXI) injection 0.25 mg, 0.25 mg, Intravenous,  Once, 6 of 6 cycles Administration: 0.25 mg (07/05/2019), 0.25 mg (07/26/2019), 0.25 mg (08/16/2019), 0.25 mg (09/13/2019), 0.25 mg (10/04/2019), 0.25 mg (10/25/2019) pegfilgrastim-jmdb (FULPHILA) injection 6 mg, 6 mg, Subcutaneous,  Once, 6 of 6 cycles Administration: 6 mg (07/07/2019), 6 mg (07/28/2019), 6 mg (08/18/2019), 6 mg (09/15/2019), 6 mg (10/06/2019), 6 mg (10/27/2019) CARBOplatin (PARAPLATIN) 750 mg in sodium chloride 0.9 %  250 mL chemo infusion, 750 mg (100 % of original dose 745.2 mg), Intravenous,  Once, 6 of 6 cycles Dose modification:   (original dose 745.2 mg, Cycle 1),   (original dose 745.2 mg, Cycle 2),   (original dose 745.2 mg, Cycle 3),   (original dose 745.2 mg, Cycle 4),   (original dose 745.2 mg, Cycle 5),   (original dose 745.2 mg, Cycle 6) Administration: 750 mg (07/05/2019), 750 mg (07/26/2019), 750 mg (08/16/2019), 750 mg (09/13/2019), 750 mg (10/04/2019), 750 mg (10/25/2019) fosaprepitant (EMEND) 150 mg in sodium chloride 0.9 % 145 mL IVPB, 150 mg, Intravenous,  Once, 6 of 6 cycles Administration: 150 mg (07/05/2019), 150 mg (07/26/2019), 150 mg (08/16/2019), 150 mg (09/13/2019), 150 mg (10/04/2019), 150 mg (10/25/2019) PACLitaxel (TAXOL) 414 mg in sodium chloride 0.9 % 500 mL chemo infusion (> 35m/m2), 175 mg/m2 = 414 mg, Intravenous,  Once, 6 of 6 cycles Administration: 414 mg (07/05/2019), 414 mg (07/26/2019), 414 mg (08/16/2019), 414 mg (09/13/2019), 414 mg (10/04/2019), 414 mg (10/25/2019)  for chemotherapy treatment.    09/21/2019 - 10/28/2019 Radiation Therapy   HDR brachytherapy, 30Gy in 5 fractions, IR-192     Interval History: Patient presents today after completion of systemic chemotherapy with carboplatin and paclitaxel (1/4-4/26, 6 cycles) and vaginal brachytherapy for her high risk uterine cancer.  She recently underwent posttreatment imaging in mid May with no evidence of metastatic disease.  She presents today overall doing well.  She notes some difficulty with chemotherapy but is recovering.  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  She denies any issues with performing tasks  with her fingers.  She was started on Lyrica which is helping some.  She endorses a good appetite without nausea or emesis.  She reports normal bowel function and denies any changes to her urinary function.  She denies any vaginal bleeding or discharge.  S/p COVID  vaccines. Had mammogram in 11/2019.  Past Medical/Surgical History: Past Medical History:  Diagnosis Date  . Ankle pain   . Anxiety   . Arthritis   . Asthma    Seasonal asthma  . Breast cancer ([place]) 2008   ILC of Left breast; ER+  . Breast cancer ([place]) 2015   IDC+DCIS of Left breast; ER/PR+, Her2-, Ki67 = 4%  . Depression   . Endometrial cancer ([place])   . GERD (gastroesophageal reflux disease)   . H/O hiatal hernia 2003  . Hepatitis    history in early 20's.  Marland Kitchen History of atrial fibrillation 2011  . History of blood transfusion   . History of hepatitis A    Age 35  . History of pneumonia   . Hyperactive gag reflex   . Hyperlipidemia   . Hypertension   . Knee pain   . Obesity, Class III, BMI 40-49.9 (morbid obesity) ([place])   . OSA on CPAP   . Pneumonia   . Port-A-Cath in place 06/28/2019  . Sciatica   . Vertigo     Past Surgical History:  Procedure Laterality Date  . BREAST LUMPECTOMY Left 1982,2008   x 2   . CHOLECYSTECTOMY N/A 12/31/2016   Procedure: LAPAROSCOPIC CHOLECYSTECTOMY WITH POSSIBLE INTRAOPERATIVE CHOLANGIOGRAM;  Surgeon: Rolm Bookbinder, MD;  Location: [facility];  Service: General;  Laterality: N/A;  . COLONOSCOPY    . EYE SURGERY     bil cataract surgery  . HIATAL HERNIA REPAIR  2003  . Lymph node removal  2008  . MASTECTOMY MODIFIED RADICAL Left 01/26/2014   Procedure: LEFT MODIFIED RADICAL MASTECTOMY;  Surgeon: Jamesetta So, MD;  Location: [facility];  Service: General;  Laterality: Left;  . PORT-A-CATH REMOVAL  2009 ish  . PORTA CATH INSERTION  [id number]  . PORTACATH PLACEMENT Right 06/23/2019   Procedure: INSERTION PORT-A-CATH;  Surgeon: Aviva Signs, MD;  Location: [facility];  Service: General;  Laterality: Right;  . ROBOTIC ASSISTED TOTAL HYSTERECTOMY WITH BILATERAL SALPINGO OOPHERECTOMY N/A 05/18/2019   Procedure: XI ROBOTIC ASSISTED TOTAL HYSTERECTOMY WITH BILATERAL SALPINGO OOPHORECTOMY;  Surgeon: Lafonda Mosses, MD;  Location: [facility];  Service:  Gynecology;  Laterality: N/A;  . SENTINEL NODE BIOPSY N/A 05/18/2019   Procedure: SENTINEL LYMPH NODE BIOPSY;  Surgeon: Lafonda Mosses, MD;  Location: [facility];  Service: Gynecology;  Laterality: N/A;  . TOTAL KNEE ARTHROPLASTY Right 2006  . TOTAL KNEE ARTHROPLASTY  08/12/2011   Procedure: TOTAL KNEE ARTHROPLASTY;  Surgeon: Arther Abbott, MD;  Location: [facility];  Service: Orthopedics;  Laterality: Left;  . TUBAL LIGATION  1980    Family History  Problem Relation Age of Onset  . Stroke Father   . Heart disease Father   . Skin cancer Sister        dx. 51-58; unknown type  . Throat cancer Maternal Uncle 75       smoker; voicebox removed  . Alzheimer's disease Paternal Grandmother   . Heart attack Paternal Grandfather   . Alzheimer's disease Maternal Aunt   . Heart disease Maternal Uncle   . Lung cancer Cousin        smoker  . Lung cancer Paternal Aunt  dx. mid-70s; worked at [employer]  . Heart attack Daughter        caused by thyroid issues  . Heart disease Other   . Arthritis Other   . Lung disease Other   . Cancer Other   . Kidney disease Other   . Colon cancer Other   . Ovarian cancer Other   . Endometrial cancer Other     Social History   Socioeconomic History  . Marital status: Widowed    Spouse name: Not on file  . Number of children: 2  . Years of education: 45  . Highest education level: Not on file  Occupational History  . Occupation: retired  Tobacco Use  . Smoking status: Former Smoker    Packs/day: 3.00    Years: 25.00    Pack years: 75.00    Types: Cigarettes    Quit date: 06/30/1997    Years since quitting: 22.4  . Smokeless tobacco: Never Used  Vaping Use  . Vaping Use: Never used  Substance and Sexual Activity  . Alcohol use: Yes    Alcohol/week: 0.0 standard drinks    Comment: 2 glasses wine per month  . Drug use: No  . Sexual activity: Not Currently    Birth control/protection: Surgical    Comment: tubal  Other Topics  Concern  . Not on file  Social History Narrative   Drinks 2-3 cups of coffee a day    Social Determinants of Health   Financial Resource Strain:   . Difficulty of Paying Living Expenses:   Food Insecurity:   . Worried About Charity fundraiser in the Last Year:   . Arboriculturist in the Last Year:   Transportation Needs:   . Film/video editor (Medical):   Marland Kitchen Lack of Transportation (Non-Medical):   Physical Activity:   . Days of Exercise per Week:   . Minutes of Exercise per Session:   Stress:   . Feeling of Stress :   Social Connections:   . Frequency of Communication with Friends and Family:   . Frequency of Social Gatherings with Friends and Family:   . Attends Religious Services:   . Active Member of Clubs or Organizations:   . Attends Archivist Meetings:   Marland Kitchen Marital Status:     Current Medications:  Current Outpatient Medications:  .  albuterol (PROVENTIL) (2.5 MG/3ML) 0.083% nebulizer solution, Take 3 mLs by nebulization 4 (four) times daily as needed for wheezing or shortness of breath. , Disp: , Rfl:  .  albuterol (VENTOLIN HFA) 108 (90 Base) MCG/ACT inhaler, Inhale 1-2 puffs into the lungs every 6 (six) hours as needed for wheezing or shortness of breath., Disp: , Rfl:  .  apixaban (ELIQUIS) 5 MG TABS tablet, Take 1 tablet (5 mg total) by mouth 2 (two) times daily., Disp: 180 tablet, Rfl: 3 .  buPROPion (WELLBUTRIN XL) 300 MG 24 hr tablet, Take 300 mg by mouth daily., Disp: , Rfl:  .  Calcium Carbonate-Vit D-Min (CALCIUM 1200 PO), Take 1 tablet by mouth daily. , Disp: , Rfl:  .  furosemide (LASIX) 40 MG tablet, TAKE 1 TABLET TWICE A DAY, Disp: 180 tablet, Rfl: 1 .  MAGNESIUM-OXIDE 400 (241.3 Mg) MG tablet, TAKE 2 TABLETS(800 MG) BY MOUTH TWICE DAILY, Disp: 120 tablet, Rfl: 2 .  metoprolol tartrate (LOPRESSOR) 25 MG tablet, TAKE 1 TABLET TWICE A DAY, Disp: 180 tablet, Rfl: 1 .  omeprazole (PRILOSEC) 20 MG capsule, Take 20  mg by mouth 2 (two) times daily.  , Disp: , Rfl:  .  potassium chloride SA (KLOR-CON M20) 20 MEQ tablet, Take 2 tablets (40 mEq total) by mouth every morning. & 20 meq in the evening (Patient taking differently: Take 20 mEq by mouth 2 (two) times daily. ), Disp: 180 tablet, Rfl: 3 .  pregabalin (LYRICA) 50 MG capsule, Take 1 capsule (50 mg total) by mouth 2 (two) times daily., Disp: 60 capsule, Rfl: 2 .  rosuvastatin (CRESTOR) 20 MG tablet, Take 20 mg by mouth at bedtime. , Disp: , Rfl:  .  sertraline (ZOLOFT) 100 MG tablet, Take 100 mg by mouth at bedtime., Disp: , Rfl:  .  traMADol (ULTRAM) 50 MG tablet, Take 1 tablet (50 mg total) by mouth every 6 (six) hours as needed for severe pain., Disp: 30 tablet, Rfl: 0 .  CARBOPLATIN IV, Inject into the vein every 21 ( twenty-one) days. (Patient not taking: Reported on 12/24/2019), Disp: , Rfl:  .  lidocaine-prilocaine (EMLA) cream, Apply a small amount to port a cath site and cover with plastic wrap 1 hour prior to chemotherapy appointments (Patient not taking: Reported on 12/24/2019), Disp: 30 g, Rfl: 3 .  PACLITAXEL IV, Inject into the vein every 21 ( twenty-one) days. (Patient not taking: Reported on 12/24/2019), Disp: , Rfl:  .  prochlorperazine (COMPAZINE) 10 MG tablet, Take 1 tablet (10 mg total) by mouth every 6 (six) hours as needed (Nausea or vomiting). (Patient not taking: Reported on 12/24/2019), Disp: 30 tablet, Rfl: 1 No current facility-administered medications for this visit.  Facility-Administered Medications Ordered in Other Visits:  .  heparin lock flush 100 unit/mL, 500 Units, Intracatheter, Daily PRN, Derek Jack, MD .  sodium chloride flush (NS) 0.9 % injection 10 mL, 10 mL, Intracatheter, PRN, Derek Jack, MD  Review of Systems: Denies appetite changes, fevers, chills, fatigue, unexplained weight changes. Denies hearing loss, neck lumps or masses, mouth sores, ringing in ears or voice changes. Denies cough or wheezing.  Denies shortness of  breath. Denies chest pain or palpitations. Denies leg swelling. Denies abdominal distention, pain, blood in stools, constipation, diarrhea, nausea, vomiting, or early satiety. Denies pain with intercourse, dysuria, frequency, hematuria or incontinence. Denies hot flashes, pelvic pain, vaginal bleeding or vaginal discharge.   Denies joint pain, back pain or muscle pain/cramps. Denies itching, rash, or wounds. Denies dizziness, headaches, or seizures. Denies swollen lymph nodes or glands, denies easy bruising or bleeding. Denies anxiety, depression, confusion, or decreased concentration.  Physical Exam: BP 131/76 (BP Location: Right Wrist, Patient Position: Sitting)   Pulse 78   Temp 98.5 F (36.9 C) (Oral)   Resp 18   Ht 5' 6"  (1.676 m)   Wt 264 lb 4.8 oz (119.9 kg)   SpO2 97%   BMI 42.66 kg/m  General: Alert, oriented, no acute distress. HEENT: Normocephalic, atraumatic, sclera anicteric. Hair is starting to grow back. Chest: Clear to auscultation bilaterally.  Port site clean. Cardiovascular: Regular rate and rhythm, no murmurs. Abdomen: Obese, soft, nontender.  Normoactive bowel sounds.  No masses or hepatosplenomegaly appreciated.  Well-healed laparoscopic incisions. Extremities: Grossly normal range of motion.  Warm, well perfused.  No edema bilaterally. Skin: No rashes or lesions noted. Lymphatics: No cervical, supraclavicular, or inguinal adenopathy. GU: Normal appearing external genitalia without erythema, excoriation, or lesions.  Speculum exam reveals moderately atrophic vaginal mucosa with radiation changes, cuff intact without masses.  Bimanual exam reveals cuff intact, no lesions or nodularity.  Rectovaginal exam confirms  these findings.  Laboratory & Radiologic Studies: CT on 5/17: 1. No evidence of recurrent or metastatic carcinoma within the abdomen or pelvis. 2. Stable small hiatal hernia. 3. Mild sigmoid diverticulosis. No radiographic evidence of diverticulitis  or other acute findings.  Assessment & Plan: Stephanie Jarvis is a 69 y.o. 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  Patient is overall doing well and has completed adjuvant treatment now.  She is NED on exam today.  We discussed signs and symptoms that would be concerning for recurrence of her uterine cancer including vaginal bleeding, discharge, pain, change to bowel function, decreased appetite.  She knows to call the clinic sooner if she develops these symptoms before her next scheduled visit.  Per NCCN and SGO surveillance recommendations, we discussed continued surveillance visits every 3 months for the first couple of years in the setting of high risk disease.  She is following with her medical oncologist and I am happy to treat visits and see her every 6 months.  The patient is thinking she will be moving to [place] sometime after Labor Day.  I offered to make her an appointment in 3 months so that if she is still in town I am able to see her right before she moves and, in the event that she has already moved, I can help get her referred to a GYN oncologist in [place].  Patient met with genetics after her somatic testing showed her tumor was MSI high.  It appears that she had testing performed but I cannot find these results in her chart.  I reached out to one of our genetic counselors to help clarify.  23 minutes of total time was spent for this patient encounter, including preparation, face-to-face counseling with the patient and coordination of care, and documentation of the encounter.  Jeral Pinch, MD  Division of Gynecologic Oncology  Department of Obstetrics and Gynecology  [facility]

## 2019-12-24 NOTE — Patient Instructions (Signed)
Follow up with Dr. Berline Lopes in three months.

## 2020-01-05 ENCOUNTER — Inpatient Hospital Stay (HOSPITAL_COMMUNITY): Payer: Medicare Other | Attending: Hematology

## 2020-01-05 ENCOUNTER — Other Ambulatory Visit: Payer: Self-pay

## 2020-01-05 DIAGNOSIS — Z809 Family history of malignant neoplasm, unspecified: Secondary | ICD-10-CM | POA: Insufficient documentation

## 2020-01-05 DIAGNOSIS — R200 Anesthesia of skin: Secondary | ICD-10-CM | POA: Insufficient documentation

## 2020-01-05 DIAGNOSIS — E669 Obesity, unspecified: Secondary | ICD-10-CM | POA: Diagnosis not present

## 2020-01-05 DIAGNOSIS — E876 Hypokalemia: Secondary | ICD-10-CM | POA: Insufficient documentation

## 2020-01-05 DIAGNOSIS — Z808 Family history of malignant neoplasm of other organs or systems: Secondary | ICD-10-CM | POA: Insufficient documentation

## 2020-01-05 DIAGNOSIS — Z170 Estrogen receptor positive status [ER+]: Secondary | ICD-10-CM | POA: Insufficient documentation

## 2020-01-05 DIAGNOSIS — Z801 Family history of malignant neoplasm of trachea, bronchus and lung: Secondary | ICD-10-CM | POA: Insufficient documentation

## 2020-01-05 DIAGNOSIS — Z8041 Family history of malignant neoplasm of ovary: Secondary | ICD-10-CM | POA: Diagnosis not present

## 2020-01-05 DIAGNOSIS — G629 Polyneuropathy, unspecified: Secondary | ICD-10-CM | POA: Diagnosis not present

## 2020-01-05 DIAGNOSIS — Z87891 Personal history of nicotine dependence: Secondary | ICD-10-CM | POA: Diagnosis not present

## 2020-01-05 DIAGNOSIS — Z818 Family history of other mental and behavioral disorders: Secondary | ICD-10-CM | POA: Diagnosis not present

## 2020-01-05 DIAGNOSIS — C50412 Malignant neoplasm of upper-outer quadrant of left female breast: Secondary | ICD-10-CM | POA: Insufficient documentation

## 2020-01-05 DIAGNOSIS — Z823 Family history of stroke: Secondary | ICD-10-CM | POA: Diagnosis not present

## 2020-01-05 DIAGNOSIS — D539 Nutritional anemia, unspecified: Secondary | ICD-10-CM | POA: Diagnosis not present

## 2020-01-05 DIAGNOSIS — Z841 Family history of disorders of kidney and ureter: Secondary | ICD-10-CM | POA: Insufficient documentation

## 2020-01-05 DIAGNOSIS — Z8249 Family history of ischemic heart disease and other diseases of the circulatory system: Secondary | ICD-10-CM | POA: Insufficient documentation

## 2020-01-05 DIAGNOSIS — Z800 Family history of malignant neoplasm of digestive organs: Secondary | ICD-10-CM | POA: Diagnosis not present

## 2020-01-05 DIAGNOSIS — D649 Anemia, unspecified: Secondary | ICD-10-CM

## 2020-01-05 DIAGNOSIS — Z8049 Family history of malignant neoplasm of other genital organs: Secondary | ICD-10-CM | POA: Diagnosis not present

## 2020-01-05 DIAGNOSIS — Z79899 Other long term (current) drug therapy: Secondary | ICD-10-CM | POA: Diagnosis not present

## 2020-01-05 DIAGNOSIS — C541 Malignant neoplasm of endometrium: Secondary | ICD-10-CM | POA: Insufficient documentation

## 2020-01-05 LAB — COMPREHENSIVE METABOLIC PANEL
ALT: 30 U/L (ref 0–44)
AST: 27 U/L (ref 15–41)
Albumin: 3.7 g/dL (ref 3.5–5.0)
Alkaline Phosphatase: 99 U/L (ref 38–126)
Anion gap: 12 (ref 5–15)
BUN: 22 mg/dL (ref 8–23)
CO2: 26 mmol/L (ref 22–32)
Calcium: 9.1 mg/dL (ref 8.9–10.3)
Chloride: 101 mmol/L (ref 98–111)
Creatinine, Ser: 0.96 mg/dL (ref 0.44–1.00)
GFR calc Af Amer: >60 mL/min (ref >60)
GFR calc non Af Amer: 60 mL/min — ABNORMAL LOW (ref >60)
Glucose, Bld: 122 mg/dL — ABNORMAL HIGH (ref 70–99)
Potassium: 3.5 mmol/L (ref 3.5–5.1)
Sodium: 139 mmol/L (ref 135–145)
Total Bilirubin: 0.4 mg/dL (ref 0.3–1.2)
Total Protein: 7.2 g/dL (ref 6.5–8.1)

## 2020-01-05 LAB — CBC WITH DIFFERENTIAL/PLATELET
Abs Immature Granulocytes: 0.02 10*3/uL (ref 0.00–0.07)
Basophils Absolute: 0 10*3/uL (ref 0.0–0.1)
Basophils Relative: 1 %
Eosinophils Absolute: 0.1 10*3/uL (ref 0.0–0.5)
Eosinophils Relative: 2 %
HCT: 32.5 % — ABNORMAL LOW (ref 36.0–46.0)
Hemoglobin: 10.3 g/dL — ABNORMAL LOW (ref 12.0–15.0)
Immature Granulocytes: 0 %
Lymphocytes Relative: 31 %
Lymphs Abs: 2 10*3/uL (ref 0.7–4.0)
MCH: 33 pg (ref 26.0–34.0)
MCHC: 31.7 g/dL (ref 30.0–36.0)
MCV: 104.2 fL — ABNORMAL HIGH (ref 80.0–100.0)
Monocytes Absolute: 0.8 10*3/uL (ref 0.1–1.0)
Monocytes Relative: 12 %
Neutro Abs: 3.5 10*3/uL (ref 1.7–7.7)
Neutrophils Relative %: 54 %
Platelets: 199 10*3/uL (ref 150–400)
RBC: 3.12 MIL/uL — ABNORMAL LOW (ref 3.87–5.11)
RDW: 13.7 % (ref 11.5–15.5)
WBC: 6.4 10*3/uL (ref 4.0–10.5)
nRBC: 0 % (ref 0.0–0.2)

## 2020-01-05 LAB — MAGNESIUM: Magnesium: 1.6 mg/dL — ABNORMAL LOW (ref 1.7–2.4)

## 2020-01-10 ENCOUNTER — Other Ambulatory Visit: Payer: Self-pay

## 2020-01-10 ENCOUNTER — Inpatient Hospital Stay (HOSPITAL_BASED_OUTPATIENT_CLINIC_OR_DEPARTMENT_OTHER): Payer: Medicare Other | Admitting: Hematology

## 2020-01-10 ENCOUNTER — Encounter (HOSPITAL_COMMUNITY): Payer: Self-pay | Admitting: Hematology

## 2020-01-10 VITALS — BP 113/48 | HR 80 | Temp 98.5°F | Resp 22 | Wt 267.0 lb

## 2020-01-10 DIAGNOSIS — E876 Hypokalemia: Secondary | ICD-10-CM | POA: Diagnosis not present

## 2020-01-10 DIAGNOSIS — D539 Nutritional anemia, unspecified: Secondary | ICD-10-CM | POA: Diagnosis not present

## 2020-01-10 DIAGNOSIS — G629 Polyneuropathy, unspecified: Secondary | ICD-10-CM | POA: Diagnosis not present

## 2020-01-10 DIAGNOSIS — C541 Malignant neoplasm of endometrium: Secondary | ICD-10-CM | POA: Diagnosis not present

## 2020-01-10 DIAGNOSIS — Z170 Estrogen receptor positive status [ER+]: Secondary | ICD-10-CM | POA: Diagnosis not present

## 2020-01-10 DIAGNOSIS — C50412 Malignant neoplasm of upper-outer quadrant of left female breast: Secondary | ICD-10-CM | POA: Diagnosis not present

## 2020-01-10 MED ORDER — PREGABALIN 100 MG PO CAPS: 100.0000 mg | ORAL_CAPSULE | Freq: Two times a day (BID) | ORAL | 6 refills | Status: AC

## 2020-01-10 MED ORDER — MAGNESIUM OXIDE 400 (241.3 MG) MG PO TABS: 800.0000 mg | ORAL_TABLET | Freq: Three times a day (TID) | ORAL | 6 refills | Status: AC

## 2020-01-10 NOTE — Progress Notes (Signed)
[facility] [address] [place] [address]   CLINIC:  Medical Oncology/Hematology  PCP:  [name], MD [place] / [place] [address] [phone]   REASON FOR VISIT:  Follow-up for endometrioid adenocarcinoma  PRIOR THERAPY:  1. Debulking surgery 05/18/2019. 2. Carboplatin and paclitaxel x 6 cycles from 07/05/2019 through 10/25/2019.  CURRENT THERAPY: Surveillance  BRIEF ONCOLOGIC HISTORY:  Oncology History  Breast cancer ([facility])  12/08/2013 Procedure   Left needle core biopsy   12/09/2013 Pathology Results   Invasive ductal carcinoma with DCIS, ER 100%, PR 31%, Ki-67 marker 4%.  Insufficient material for HER2 testing.   12/28/2013 Breast MRI   Post biopsy changes located within the left breast laterally (upper-outer quadrant) related to the patient's recent stereotactic biopsy. Also, postsurgical scarring changes within the left breast. No areas of worrisome enhancement within the right breast.   01/26/2014 Definitive Surgery   Left modified radical mastectomy by Dr. Arnoldo Morale   02/01/2014 Pathology Results   Intermediate grade DCIS, 0.5 cm, 0/6 lymph nodes, negative resection margins.   03/13/2016 Imaging   Bone density- BMD as determined from Femur Neck Right is 1.023 g/cm2 with a T-Score of -0.1. This patient is considered normal according to [facility]) criteria.   Endometrial cancer ([place])  04/22/2019 Initial Biopsy   EMB - Gr1 EMCA   04/28/2019 Initial Diagnosis   Endometrial cancer determined by uterine biopsy ([place])   05/18/2019 Surgery   TRH/BSO, bilateral SLNs SLNs bulky.    Pathologic Stage   IIIC1, +LVSI, outer half MI, endometrioid adenoca, G 1-2 MSI-H   07/05/2019 -  Chemotherapy   The patient had palonosetron (ALOXI) injection 0.25 mg, 0.25 mg, Intravenous,  Once, 6 of 6 cycles Administration: 0.25 mg (07/05/2019), 0.25 mg (07/26/2019), 0.25 mg (08/16/2019), 0.25 mg (09/13/2019), 0.25 mg  (10/04/2019), 0.25 mg (10/25/2019) pegfilgrastim-jmdb (FULPHILA) injection 6 mg, 6 mg, Subcutaneous,  Once, 6 of 6 cycles Administration: 6 mg (07/07/2019), 6 mg (07/28/2019), 6 mg (08/18/2019), 6 mg (09/15/2019), 6 mg (10/06/2019), 6 mg (10/27/2019) CARBOplatin (PARAPLATIN) 750 mg in sodium chloride 0.9 % 250 mL chemo infusion, 750 mg (100 % of original dose 745.2 mg), Intravenous,  Once, 6 of 6 cycles Dose modification:   (original dose 745.2 mg, Cycle 1),   (original dose 745.2 mg, Cycle 2),   (original dose 745.2 mg, Cycle 3),   (original dose 745.2 mg, Cycle 4),   (original dose 745.2 mg, Cycle 5),   (original dose 745.2 mg, Cycle 6) Administration: 750 mg (07/05/2019), 750 mg (07/26/2019), 750 mg (08/16/2019), 750 mg (09/13/2019), 750 mg (10/04/2019), 750 mg (10/25/2019) fosaprepitant (EMEND) 150 mg in sodium chloride 0.9 % 145 mL IVPB, 150 mg, Intravenous,  Once, 6 of 6 cycles Administration: 150 mg (07/05/2019), 150 mg (07/26/2019), 150 mg (08/16/2019), 150 mg (09/13/2019), 150 mg (10/04/2019), 150 mg (10/25/2019) PACLitaxel (TAXOL) 414 mg in sodium chloride 0.9 % 500 mL chemo infusion (> 18m/m2), 175 mg/m2 = 414 mg, Intravenous,  Once, 6 of 6 cycles Administration: 414 mg (07/05/2019), 414 mg (07/26/2019), 414 mg (08/16/2019), 414 mg (09/13/2019), 414 mg (10/04/2019), 414 mg (10/25/2019)  for chemotherapy treatment.    09/21/2019 - 10/28/2019 Radiation Therapy   HDR brachytherapy, 30Gy in 5 fractions, IR-192     CANCER STAGING: Cancer Staging Breast cancer ([facility] Staging form: Breast, AJCC 7th Edition - Clinical stage from 02/01/2014: Stage 0 (Tis (DCIS), N0, M0) - Signed by KBaird Cancer PA-C on 12/01/2015  Endometrial cancer (  [place]) Staging form: Corpus Uteri - Carcinoma and Carcinosarcoma, AJCC 8th Edition - Clinical stage from 05/18/2019: FIGO Stage IIIC1 (cT1b, cN1a(sn)) - Unsigned   INTERVAL HISTORY:  Ms. Stephanie Jarvis, a 69 y.o. female, returns for routine follow-up of her endometrioid adenocarcinoma.  Meklit was last seen on 11/22/2019.  Today she is accompanied by her husband. She reports that they are getting ready to move to [place] in September, 2021. Overall she is feeling okay. She is taking 6 tablets of magnesium daily. The current dose of Lyrica 50 mg is not helping with her neuropathy and she is not reporting feeling drowsy.   REVIEW OF SYSTEMS:  Review of Systems  Constitutional: Negative for appetite change.  Neurological: Positive for numbness (hands & feet, stable).  All other systems reviewed and are negative.   PAST MEDICAL/SURGICAL HISTORY:  Past Medical History:  Diagnosis Date   Ankle pain    Anxiety    Arthritis    Asthma    Seasonal asthma   Breast cancer ([facility]) 2008   [place] of Left breast; ER+   Breast cancer ([facility]) 2015   IDC+DCIS of Left breast; ER/PR+, Her2-, Ki67 = 4%   Depression    Endometrial cancer ([facility])    GERD (gastroesophageal reflux disease)    H/O hiatal hernia 2003   Hepatitis    history in early 20's.   History of atrial fibrillation 2011   History of blood transfusion    History of hepatitis A    Age 55   History of pneumonia    Hyperactive gag reflex    Hyperlipidemia    Hypertension    Knee pain    Obesity, Class III, BMI 40-49.9 (morbid obesity) ([place])    OSA on CPAP    Pneumonia    Port-A-Cath in place 06/28/2019   Sciatica    Vertigo    Past Surgical History:  Procedure Laterality Date   BREAST LUMPECTOMY Left 0762,[id number]   x 2    CHOLECYSTECTOMY N/A 12/31/2016   Procedure: LAPAROSCOPIC CHOLECYSTECTOMY WITH POSSIBLE INTRAOPERATIVE CHOLANGIOGRAM;  Surgeon: Rolm Bookbinder, MD;  Location: [facility];  Service: General;  Laterality: N/A;   COLONOSCOPY     EYE SURGERY     bil cataract surgery   HIATAL HERNIA REPAIR  2003   Lymph node removal  2008   MASTECTOMY MODIFIED RADICAL Left 01/26/2014   Procedure: LEFT MODIFIED RADICAL MASTECTOMY;  Surgeon: Jamesetta So, MD;  Location: [facility];   Service: General;  Laterality: Left;   PORT-A-CATH REMOVAL  2009 ish   PORTA CATH INSERTION  [id number]   PORTACATH PLACEMENT Right 06/23/2019   Procedure: INSERTION PORT-A-CATH;  Surgeon: Aviva Signs, MD;  Location: [facility];  Service: General;  Laterality: Right;   ROBOTIC ASSISTED TOTAL HYSTERECTOMY WITH BILATERAL SALPINGO OOPHERECTOMY N/A 05/18/2019   Procedure: XI ROBOTIC ASSISTED TOTAL HYSTERECTOMY WITH BILATERAL SALPINGO OOPHORECTOMY;  Surgeon: Lafonda Mosses, MD;  Location: [facility];  Service: Gynecology;  Laterality: N/A;   SENTINEL NODE BIOPSY N/A 05/18/2019   Procedure: SENTINEL LYMPH NODE BIOPSY;  Surgeon: Lafonda Mosses, MD;  Location: [facility];  Service: Gynecology;  Laterality: N/A;   TOTAL KNEE ARTHROPLASTY Right 2006   TOTAL KNEE ARTHROPLASTY  08/12/2011   Procedure: TOTAL KNEE ARTHROPLASTY;  Surgeon: Arther Abbott, MD;  Location: [facility];  Service: Orthopedics;  Laterality: Left;   TUBAL LIGATION  1980    SOCIAL HISTORY:  Social History   Socioeconomic History   Marital status: Widowed  Spouse name: Not on file   Number of children: 2   Years of education: 53   Highest education level: Not on file  Occupational History   Occupation: retired  Tobacco Use   Smoking status: Former Smoker    Packs/day: 3.00    Years: 25.00    Pack years: 75.00    Types: Cigarettes    Quit date: 06/30/1997    Years since quitting: 22.5   Smokeless tobacco: Never Used  Vaping Use   Vaping Use: Never used  Substance and Sexual Activity   Alcohol use: Yes    Alcohol/week: 0.0 standard drinks    Comment: 2 glasses wine per month   Drug use: No   Sexual activity: Not Currently    Birth control/protection: Surgical    Comment: tubal  Other Topics Concern   Not on file  Social History Narrative   Drinks 2-3 cups of coffee a day    Social Determinants of Health   Financial Resource Strain:    Difficulty of Paying Living Expenses:   Food Insecurity:     Worried About Charity fundraiser in the Last Year:    Arboriculturist in the Last Year:   Transportation Needs:    Film/video editor (Medical):    Lack of Transportation (Non-Medical):   Physical Activity:    Days of Exercise per Week:    Minutes of Exercise per Session:   Stress:    Feeling of Stress :   Social Connections:    Frequency of Communication with Friends and Family:    Frequency of Social Gatherings with Friends and Family:    Attends Religious Services:    Active Member of Clubs or Organizations:    Attends Music therapist:    Marital Status:   Intimate Partner Violence:    Fear of Current or Ex-Partner:    Emotionally Abused:    Physically Abused:    Sexually Abused:     FAMILY HISTORY:  Family History  Problem Relation Age of Onset   Stroke Father    Heart disease Father    Skin cancer Sister        dx. 57-58; unknown type   Throat cancer Maternal Uncle 67       smoker; voicebox removed   Alzheimer's disease Paternal Grandmother    Heart attack Paternal Grandfather    Alzheimer's disease Maternal Aunt    Heart disease Maternal Uncle    Lung cancer Cousin        smoker   Lung cancer Paternal Aunt        dx. mid-70s; worked at [employer] attack Daughter        caused by thyroid issues   Heart disease Other    Arthritis Other    Lung disease Other    Cancer Other    Kidney disease Other    Colon cancer Other    Ovarian cancer Other    Endometrial cancer Other     CURRENT MEDICATIONS:  Current Outpatient Medications  Medication Sig Dispense Refill   albuterol (PROVENTIL) (2.5 MG/3ML) 0.083% nebulizer solution Take 3 mLs by nebulization 4 (four) times daily as needed for wheezing or shortness of breath.      albuterol (VENTOLIN HFA) 108 (90 Base) MCG/ACT inhaler Inhale 1-2 puffs into the lungs every 6 (six) hours as needed for wheezing or shortness of breath.     apixaban  (ELIQUIS) 5 MG TABS tablet  Take 1 tablet (5 mg total) by mouth 2 (two) times daily. 180 tablet 3   buPROPion (WELLBUTRIN XL) 300 MG 24 hr tablet Take 300 mg by mouth daily.     Calcium Carbonate-Vit D-Min (CALCIUM 1200 PO) Take 1 tablet by mouth daily.      furosemide (LASIX) 40 MG tablet TAKE 1 TABLET TWICE A DAY 180 tablet 1   lidocaine-prilocaine (EMLA) cream Apply a small amount to port a cath site and cover with plastic wrap 1 hour prior to chemotherapy appointments 30 g 3   MAGNESIUM-OXIDE 400 (241.3 Mg) MG tablet TAKE 2 TABLETS(800 MG) BY MOUTH TWICE DAILY 120 tablet 2   metoprolol tartrate (LOPRESSOR) 25 MG tablet TAKE 1 TABLET TWICE A DAY 180 tablet 1   omeprazole (PRILOSEC) 20 MG capsule Take 20 mg by mouth 2 (two) times daily.      potassium chloride SA (KLOR-CON M20) 20 MEQ tablet Take 2 tablets (40 mEq total) by mouth every morning. & 20 meq in the evening (Patient taking differently: Take 20 mEq by mouth 2 (two) times daily. ) 180 tablet 3   pregabalin (LYRICA) 50 MG capsule Take 1 capsule (50 mg total) by mouth 2 (two) times daily. 60 capsule 2   prochlorperazine (COMPAZINE) 10 MG tablet Take 1 tablet (10 mg total) by mouth every 6 (six) hours as needed (Nausea or vomiting). (Patient taking differently: Take 10 mg by mouth every 6 (six) hours as needed (Nausea or vomiting). As needed) 30 tablet 1   rosuvastatin (CRESTOR) 20 MG tablet Take 20 mg by mouth at bedtime.      sertraline (ZOLOFT) 100 MG tablet Take 100 mg by mouth at bedtime.     No current facility-administered medications for this visit.   Facility-Administered Medications Ordered in Other Visits  Medication Dose Route Frequency Provider Last Rate Last Admin   heparin lock flush 100 unit/mL  500 Units Intracatheter Daily PRN Derek Jack, MD       sodium chloride flush (NS) 0.9 % injection 10 mL  10 mL Intracatheter PRN Derek Jack, MD        ALLERGIES:  No Known Allergies  PHYSICAL  EXAM:  Performance status (ECOG): 1 - Symptomatic but completely ambulatory  Vitals:   01/10/20 1418  BP: (!) 113/48  Pulse: 80  Resp: (!) 22  Temp: 98.5 F (36.9 C)  SpO2: 93%   Wt Readings from Last 3 Encounters:  01/10/20 267 lb (121.1 kg)  12/24/19 264 lb 4.8 oz (119.9 kg)  12/16/19 264 lb (119.7 kg)   Physical Exam Vitals reviewed.  Constitutional:      Appearance: Normal appearance. She is obese.  Cardiovascular:     Rate and Rhythm: Normal rate and regular rhythm.     Pulses: Normal pulses.     Heart sounds: Normal heart sounds.  Pulmonary:     Effort: Pulmonary effort is normal.     Breath sounds: Normal breath sounds.  Abdominal:     Palpations: Abdomen is soft. There is no hepatomegaly, splenomegaly or mass.     Tenderness: There is abdominal tenderness.     Hernia: No hernia is present.  Neurological:     General: No focal deficit present.     Mental Status: She is alert and oriented to person, place, and time.  Psychiatric:        Mood and Affect: Mood normal.        Behavior: Behavior normal.      LABORATORY DATA:  I have reviewed the labs as listed.  CBC Latest Ref Rng & Units 01/05/2020 11/22/2019 11/05/2019  WBC 4.0 - 10.5 K/uL 6.4 4.7 4.7  Hemoglobin 12.0 - 15.0 g/dL 10.3(L) 9.0(L) 7.4(L)  Hematocrit 36 - 46 % 32.5(L) 28.5(L) 22.6(L)  Platelets 150 - 400 K/uL 199 265 61(L)   CMP Latest Ref Rng & Units 01/05/2020 11/05/2019 10/25/2019  Glucose 70 - 99 mg/dL 122(H) 152(H) 170(H)  BUN 8 - 23 mg/dL 22 14 13   Creatinine 0.44 - 1.00 mg/dL 0.96 0.77 0.86  Sodium 135 - 145 mmol/L 139 138 137  Potassium 3.5 - 5.1 mmol/L 3.5 3.8 3.9  Chloride 98 - 111 mmol/L 101 100 99  CO2 22 - 32 mmol/L 26 27 27   Calcium 8.9 - 10.3 mg/dL 9.1 8.6(L) 8.8(L)  Total Protein 6.5 - 8.1 g/dL 7.2 6.6 6.5  Total Bilirubin 0.3 - 1.2 mg/dL 0.4 0.7 0.5  Alkaline Phos 38 - 126 U/L 99 98 91  AST 15 - 41 U/L 27 19 18   ALT 0 - 44 U/L 30 20 19     DIAGNOSTIC IMAGING:  I have  independently reviewed the scans and discussed with the patient. No results found.   ASSESSMENT:  1.  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 -6 cycles of carboplatin and paclitaxel from 07/05/2019 through 10/25/2019. -CTAP on 11/15/2019 did not show any evidence of recurrence or metastatic disease.  CA-125 was 20.9. -Vaginal brachytherapy from 09/21/2019, 5 treatments completed on 10/28/2019.   PLAN:  1.  Stage III C1 (T1 BN 1A) endometrioid adenocarcinoma: -She was evaluated by Dr. Berline Lopes on 12/24/2019 and pelvic exam was within normal limits. -Today there is very mild tenderness in the left upper quadrant which is very vague. -I reviewed her labs which are grossly within normal limits. -Mammogram on 12/02/2019 was BI-RADS Category 1. -She is reportedly moving to [place] sometime in September.  I have given a follow-up appointment in 3 months if she is still around here.  She was told to call us if she needs any help in finding a local oncologist.  She reportedly knows a local oncologist who treated her previously.  2.  Hypomagnesemia: -Magnesium today is 1.6.  She will continue magnesium 2 tablets 3 times a day.  3.  Hypokalemia: -Potassium today is 3.5.  Continue 20 mEq of potassium twice daily.  4.  Peripheral neuropathy: -No relief from Lyrica.  I will increase Lyrica 200 mg twice daily.  5.  Macrocytic anemia: -Nutritional deficiency work-up was negative.  Today hemoglobin improved to 10.3.   Orders placed this encounter:  No orders of the defined types were placed in this encounter.    Derek Jack, MD [place] [phone]   I, Milinda Antis, am acting as a scribe for Dr. Sanda Linger.  I, Derek Jack MD, have reviewed the above documentation for accuracy and completeness, and I agree with the above.

## 2020-01-10 NOTE — Patient Instructions (Signed)
[facility] at [facility] Discharge Instructions  You were seen today by Dr. Katragadda. He went over your recent results. Dr. Katragadda will see you back in 3 months for labs and follow up.   Thank you for choosing [facility] at [facility] to provide your oncology and hematology care.  To afford each patient quality time with our provider, please arrive at least 15 minutes before your scheduled appointment time.   If you have a lab appointment with the Cancer Center please come in thru the Main Entrance and check in at the main information desk  You need to re-schedule your appointment should you arrive 10 or more minutes late.  We strive to give you quality time with our providers, and arriving late affects you and other patients whose appointments are after yours.  Also, if you no show three or more times for appointments you may be dismissed from the clinic at the providers discretion.     Again, thank you for choosing [facility].  Our hope is that these requests will decrease the amount of time that you wait before being seen by our physicians.       _____________________________________________________________  Should you have questions after your visit to [facility], please contact our office at [phone] between the hours of 8:00 a.m. and 4:30 p.m.  Voicemails left after 4:00 p.m. will not be returned until the following business day.  For prescription refill requests, have your pharmacy contact our office and allow 72 hours.    Cancer Center Support Programs:   > Cancer Support Group  2nd Tuesday of the month 1pm-2pm, Journey Room   

## 2020-01-13 NOTE — Addendum Note (Signed)
Addended by: Farley Ly on: 01/13/2020 04:32 PM   Modules accepted: Orders

## 2020-01-19 ENCOUNTER — Encounter (HOSPITAL_COMMUNITY): Payer: Self-pay

## 2020-01-19 NOTE — Progress Notes (Signed)
Patient called with questions regarding port removal. Explained to patient that at this time it is too early to remove port. Patient advised that she will be transferring her care to [place] sometime in the upcoming months. Explained to patient that she will need to follow up with new oncologist about port removal.

## 2020-01-25 DIAGNOSIS — E785 Hyperlipidemia, unspecified: Secondary | ICD-10-CM | POA: Diagnosis not present

## 2020-01-25 DIAGNOSIS — F339 Major depressive disorder, recurrent, unspecified: Secondary | ICD-10-CM | POA: Diagnosis not present

## 2020-02-23 ENCOUNTER — Telehealth: Payer: Self-pay | Admitting: Cardiology

## 2020-02-23 NOTE — Telephone Encounter (Signed)
Error

## 2020-02-24 ENCOUNTER — Encounter: Payer: Self-pay | Admitting: Cardiology

## 2020-02-24 ENCOUNTER — Telehealth (INDEPENDENT_AMBULATORY_CARE_PROVIDER_SITE_OTHER): Payer: Medicare Other | Admitting: Cardiology

## 2020-02-24 VITALS — BP 138/70 | HR 76 | Ht 66.0 in | Wt 262.0 lb

## 2020-02-24 DIAGNOSIS — I480 Paroxysmal atrial fibrillation: Secondary | ICD-10-CM | POA: Diagnosis not present

## 2020-02-24 DIAGNOSIS — I313 Pericardial effusion (noninflammatory): Secondary | ICD-10-CM

## 2020-02-24 DIAGNOSIS — E782 Mixed hyperlipidemia: Secondary | ICD-10-CM

## 2020-02-24 DIAGNOSIS — I3139 Other pericardial effusion (noninflammatory): Secondary | ICD-10-CM

## 2020-02-24 NOTE — Progress Notes (Signed)
Virtual Visit via Telephone Note   This visit type was conducted due to national recommendations for restrictions regarding the COVID-19 Pandemic (e.g. social distancing) in an effort to limit this patient's exposure and mitigate transmission in our community.  Due to her co-morbid illnesses, this patient is at least at moderate risk for complications without adequate follow up.  This format is felt to be most appropriate for this patient at this time.  The patient did not have access to video technology/had technical difficulties with video requiring transitioning to audio format only (telephone).  All issues noted in this document were discussed and addressed.  No physical exam could be performed with this format.  Please refer to the patient's chart for her  consent to telehealth for [facility].    Date:  02/24/2020   ID:  Stephanie Jarvis, DOB 02/28/1951, MRN [id number] The patient was identified using 2 identifiers.  Patient Location: Home Provider Location: Office/Clinic  PCP:  [name], MD  Cardiologist:  Rozann Lesches, MD Electrophysiologist:  None   Evaluation Performed:  Follow-Up Visit  Chief Complaint:   Cardiac follow-up  History of Present Illness:    Stephanie Jarvis is a 69 y.o. female last assessed via telehealth encounter in February. We spoke by phone today. She tells me that she has completed chemotherapy and radiation, followed by oncology with endometrioid adenocarcinoma. She states that she is doing reasonably well. From a cardiac perspective, she does not report any chest pain or palpitations.  Follow-up echocardiogram in February revealed LVEF 60 to 65%, normal RV contraction, moderately dilated left atrium, moderate sized and predominantly posterior pericardial effusion without signs of hemodynamic compromise.  I reviewed her medications. She continues on Eliquis without reported spontaneous bleeding problems, also on Lopressor. I did review her  lab work from July.  She states that she will be moving to live near her daughter in [place] next month.  Past Medical History:  Diagnosis Date  . Ankle pain   . Anxiety   . Arthritis   . Asthma    Seasonal asthma  . Breast cancer ([place]) 2008   ILC of Left breast; ER+  . Breast cancer ([place]) 2015   IDC+DCIS of Left breast; ER/PR+, Her2-, Ki67 = 4%  . Depression   . Endometrial cancer ([place])   . Essential hypertension   . GERD (gastroesophageal reflux disease)   . H/O hiatal hernia 2003  . History of atrial fibrillation 2011  . History of blood transfusion   . History of hepatitis A    Age 69  . History of pneumonia   . Hyperactive gag reflex   . Hyperlipidemia   . Obesity, Class III, BMI 40-49.9 (morbid obesity) ([place])   . OSA on CPAP   . Pneumonia   . Port-A-Cath in place 06/28/2019  . Sciatica   . Vertigo    Past Surgical History:  Procedure Laterality Date  . BREAST LUMPECTOMY Left 1982,2008   x 2   . CHOLECYSTECTOMY N/A 12/31/2016   Procedure: LAPAROSCOPIC CHOLECYSTECTOMY WITH POSSIBLE INTRAOPERATIVE CHOLANGIOGRAM;  Surgeon: Rolm Bookbinder, MD;  Location: [facility];  Service: General;  Laterality: N/A;  . COLONOSCOPY    . EYE SURGERY     bil cataract surgery  . HIATAL HERNIA REPAIR  2003  . Lymph node removal  2008  . MASTECTOMY MODIFIED RADICAL Left 01/26/2014   Procedure: LEFT MODIFIED RADICAL MASTECTOMY;  Surgeon: Jamesetta So, MD;  [facility];  Service: General;  Laterality:  Left;  . PORT-A-CATH REMOVAL  2009 ish  . PORTA CATH INSERTION  [id number]  . PORTACATH PLACEMENT Right 06/23/2019   Procedure: INSERTION PORT-A-CATH;  Surgeon: Aviva Signs, MD;  [facility];  Service: General;  Laterality: Right;  . ROBOTIC ASSISTED TOTAL HYSTERECTOMY WITH BILATERAL SALPINGO OOPHERECTOMY N/A 05/18/2019   Procedure: XI ROBOTIC ASSISTED TOTAL HYSTERECTOMY WITH BILATERAL SALPINGO OOPHORECTOMY;  Surgeon: Lafonda Mosses, MD;  [facility];  Service:  Gynecology;  Laterality: N/A;  . SENTINEL NODE BIOPSY N/A 05/18/2019   Procedure: SENTINEL LYMPH NODE BIOPSY;  Surgeon: Lafonda Mosses, MD;  [facility];  Service: Gynecology;  Laterality: N/A;  . TOTAL KNEE ARTHROPLASTY Right 2006  . TOTAL KNEE ARTHROPLASTY  08/12/2011   Procedure: TOTAL KNEE ARTHROPLASTY;  Surgeon: Arther Abbott, MD;  [facility];  Service: Orthopedics;  Laterality: Left;  . TUBAL LIGATION  1980     Current Meds  Medication Sig  . albuterol (PROVENTIL) (2.5 MG/3ML) 0.083% nebulizer solution Take 3 mLs by nebulization 4 (four) times daily as needed for wheezing or shortness of breath.   Marland Kitchen albuterol (VENTOLIN HFA) 108 (90 Base) MCG/ACT inhaler Inhale 1-2 puffs into the lungs every 6 (six) hours as needed for wheezing or shortness of breath.  Marland Kitchen apixaban (ELIQUIS) 5 MG TABS tablet Take 1 tablet (5 mg total) by mouth 2 (two) times daily.  Marland Kitchen buPROPion (WELLBUTRIN XL) 300 MG 24 hr tablet Take 300 mg by mouth daily.  . Calcium Carbonate-Vit D-Min (CALCIUM 1200 PO) Take 1 tablet by mouth daily.   . furosemide (LASIX) 40 MG tablet TAKE 1 TABLET TWICE A DAY  . magnesium oxide (MAGNESIUM-OXIDE) 400 (241.3 Mg) MG tablet Take 2 tablets (800 mg total) by mouth in the morning, at noon, and at bedtime.  . metoprolol tartrate (LOPRESSOR) 25 MG tablet TAKE 1 TABLET TWICE A DAY  . omeprazole (PRILOSEC) 20 MG capsule Take 20 mg by mouth 2 (two) times daily.   . potassium chloride SA (KLOR-CON) 20 MEQ tablet Take 20 mEq by mouth 2 (two) times daily.  . pregabalin (LYRICA) 100 MG capsule Take 1 capsule (100 mg total) by mouth 2 (two) times daily.  . rosuvastatin (CRESTOR) 20 MG tablet Take 20 mg by mouth at bedtime.   . sertraline (ZOLOFT) 100 MG tablet Take 100 mg by mouth at bedtime.  . [DISCONTINUED] prochlorperazine (COMPAZINE) 10 MG tablet Take 1 tablet (10 mg total) by mouth every 6 (six) hours as needed (Nausea or vomiting). (Patient taking differently: Take 10 mg by mouth  every 6 (six) hours as needed (Nausea or vomiting). As needed)     Allergies:   Patient has no known allergies.   ROS:   No dizziness or syncope.  Prior CV studies:   The following studies were reviewed today:  Echocardiogram 08/11/2019: 1. Left ventricular ejection fraction, by estimation, is 60 to 65%. The  left ventricle has normal function. The left ventrical has no regional  wall motion abnormalities. There is mildly increased left ventricular  hypertrophy. Left ventricular diastolic  parameters are indeterminate.  2. Right ventricular systolic function is normal. The right ventricular  size is normal.  3. Left atrial size was moderately dilated.  4. Pericardial effusion is most prominent posterior to the LV, measuring  1.4 cm in diastole. . Moderate pericardial effusion. The pericardial  effusion is circumferential. There is no evidence of cardiac tamponade.  5. The mitral valve is normal in structure and function. no evidence of  mitral valve regurgitation. No evidence of mitral stenosis.  6. The aortic valve is tricuspid. Aortic valve regurgitation is not  visualized. No aortic stenosis is present.  7. The inferior vena cava is normal in size with greater than 50%  respiratory variability, suggesting right atrial pressure of 3 mmHg.   Labs/Other Tests and Data Reviewed:    EKG:  An ECG dated 02/03/2019 was personally reviewed today and demonstrated:  Sinus rhythm with lead motion artifact and poor R wave progression.  Recent Labs: 01/05/2020: ALT 30; BUN 22; Creatinine, Ser 0.96; Hemoglobin 10.3; Magnesium 1.6; Platelets 199; Potassium 3.5; Sodium 139    Wt Readings from Last 3 Encounters:  02/24/20 262 lb (118.8 kg)  01/10/20 267 lb (121.1 kg)  12/24/19 264 lb 4.8 oz (119.9 kg)     Objective:    Vital Signs:  BP 138/70   Pulse 76   Ht 5' 6"  (1.676 m)   Wt 262 lb (118.8 kg)   BMI 42.29 kg/m    Patient spoke in full sentences, not short of breath on the  phone.  ASSESSMENT & PLAN:    1. Paroxysmal atrial fibrillation with CHA2DS2-VASc score of 3. She is stable without active palpitations. Continues on Eliquis for stroke prophylaxis, no reported bleeding problems. I reviewed her lab work. She is also on Lopressor. No changes were made today. She plans to establish with a new cardiology practice once she moves to [place]. We can certainly see her as needed.  2. Moderate, predominantly posterior pericardial effusion. She is asymptomatic. No clear evidence of hemodynamic significance. Would continue to follow over time.  3. Mixed hyperlipidemia, she remains on Crestor with follow-up by Dr. Legrand Rams.   Time:   Today, I have spent 7 minutes with the patient with telehealth technology discussing the above problems.     Medication Adjustments/Labs and Tests Ordered: Current medicines are reviewed at length with the patient today.  Concerns regarding medicines are outlined above.   Tests Ordered: No orders of the defined types were placed in this encounter.   Medication Changes: No orders of the defined types were placed in this encounter.   Follow Up:  As needed.   Signed, Rozann Lesches, MD  02/24/2020 1:15 PM    [facility]

## 2020-02-24 NOTE — Patient Instructions (Addendum)
Medication Instructions:   Your physician recommends that you continue on your current medications as directed. Please refer to the Current Medication list given to you today.  Labwork:  none  Testing/Procedures:  none  Follow-Up:  Your physician recommends that you schedule a follow-up appointment in: as needed  Let us know if you need [place] in the future  [facility] in [place].   Any Other Special Instructions Will Be Listed Below (If Applicable).  If you need a refill on your cardiac medications before your next appointment, please call your pharmacy.

## 2020-02-25 DIAGNOSIS — F339 Major depressive disorder, recurrent, unspecified: Secondary | ICD-10-CM | POA: Diagnosis not present

## 2020-02-25 DIAGNOSIS — E785 Hyperlipidemia, unspecified: Secondary | ICD-10-CM | POA: Diagnosis not present

## 2020-02-26 ENCOUNTER — Other Ambulatory Visit: Payer: Self-pay | Admitting: Cardiology

## 2020-03-24 ENCOUNTER — Inpatient Hospital Stay: Payer: Medicare Other | Attending: Gynecologic Oncology | Admitting: Gynecologic Oncology

## 2020-03-24 NOTE — Progress Notes (Deleted)
Gynecologic Oncology Return Clinic Visit  03/24/20  Reason for Visit: surveillance visit in the setting of a history of advanced stage uterine cancer  Treatment History: Oncology History  Breast cancer ([place])  12/08/2013 Procedure   Left needle core biopsy   12/09/2013 Pathology Results   Invasive ductal carcinoma with DCIS, ER 100%, PR 31%, Ki-67 marker 4%.  Insufficient material for HER2 testing.   12/28/2013 Breast MRI   Post biopsy changes located within the left breast laterally (upper-outer quadrant) related to the patient's recent stereotactic biopsy. Also, postsurgical scarring changes within the left breast. No areas of worrisome enhancement within the right breast.   01/26/2014 Definitive Surgery   Left modified radical mastectomy by Dr. Arnoldo Morale   02/01/2014 Pathology Results   Intermediate grade DCIS, 0.5 cm, 0/6 lymph nodes, negative resection margins.   03/13/2016 Imaging   Bone density- BMD as determined from Femur Neck Right is 1.023 g/cm2 with a T-Score of -0.1. This patient is considered normal according to [facility]) criteria.   Endometrial cancer ([place])  04/22/2019 Initial Biopsy   EMB - Gr1 EMCA   04/28/2019 Initial Diagnosis   Endometrial cancer determined by uterine biopsy ([place])   05/18/2019 Surgery   TRH/BSO, bilateral SLNs SLNs bulky.    Pathologic Stage   IIIC1, +LVSI, outer half MI, endometrioid adenoca, G 1-2 MSI-H   07/05/2019 -  Chemotherapy   The patient had palonosetron (ALOXI) injection 0.25 mg, 0.25 mg, Intravenous,  Once, 6 of 6 cycles Administration: 0.25 mg (07/05/2019), 0.25 mg (07/26/2019), 0.25 mg (08/16/2019), 0.25 mg (09/13/2019), 0.25 mg (10/04/2019), 0.25 mg (10/25/2019) pegfilgrastim-jmdb (FULPHILA) injection 6 mg, 6 mg, Subcutaneous,  Once, 6 of 6 cycles Administration: 6 mg (07/07/2019), 6 mg (07/28/2019), 6 mg (08/18/2019), 6 mg (09/15/2019), 6 mg (10/06/2019), 6 mg (10/27/2019) CARBOplatin (PARAPLATIN) 750 mg in sodium chloride  0.9 % 250 mL chemo infusion, 750 mg (100 % of original dose 745.2 mg), Intravenous,  Once, 6 of 6 cycles Dose modification:   (original dose 745.2 mg, Cycle 1),   (original dose 745.2 mg, Cycle 2),   (original dose 745.2 mg, Cycle 3),   (original dose 745.2 mg, Cycle 4),   (original dose 745.2 mg, Cycle 5),   (original dose 745.2 mg, Cycle 6) Administration: 750 mg (07/05/2019), 750 mg (07/26/2019), 750 mg (08/16/2019), 750 mg (09/13/2019), 750 mg (10/04/2019), 750 mg (10/25/2019) fosaprepitant (EMEND) 150 mg in sodium chloride 0.9 % 145 mL IVPB, 150 mg, Intravenous,  Once, 6 of 6 cycles Administration: 150 mg (07/05/2019), 150 mg (07/26/2019), 150 mg (08/16/2019), 150 mg (09/13/2019), 150 mg (10/04/2019), 150 mg (10/25/2019) PACLitaxel (TAXOL) 414 mg in sodium chloride 0.9 % 500 mL chemo infusion (> 47m/m2), 175 mg/m2 = 414 mg, Intravenous,  Once, 6 of 6 cycles Administration: 414 mg (07/05/2019), 414 mg (07/26/2019), 414 mg (08/16/2019), 414 mg (09/13/2019), 414 mg (10/04/2019), 414 mg (10/25/2019)  for chemotherapy treatment.    09/21/2019 - 10/28/2019 Radiation Therapy   HDR brachytherapy, 30Gy in 5 fractions, IR-192     Interval History: ***  Patient presents today after completion of systemic chemotherapy with carboplatin and paclitaxel (1/4-4/26, 6 cycles) and vaginal brachytherapy for her high risk uterine cancer.  She recently underwent posttreatment imaging in mid May with no evidence of metastatic disease.  She presents today overall doing well.  She notes some difficulty with chemotherapy but is recovering.  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  She denies  any issues with performing tasks with her fingers.  She was started on Lyrica which is helping some.  She endorses a good appetite without nausea or emesis.  She reports normal bowel function and denies any changes to her urinary function.  She denies any vaginal bleeding or discharge.  S/p  COVID vaccines. Had mammogram in 11/2019.  Past Medical/Surgical History: Past Medical History:  Diagnosis Date   Ankle pain    Anxiety    Arthritis    Asthma    Seasonal asthma   Breast cancer ([place]) 2008   [place] of Left breast; ER+   Breast cancer ([place]) 2015   IDC+DCIS of Left breast; ER/PR+, Her2-, Ki67 = 4%   Depression    Endometrial cancer ([place])    Essential hypertension    GERD (gastroesophageal reflux disease)    H/O hiatal hernia 2003   History of atrial fibrillation 2011   History of blood transfusion    History of hepatitis A    Age 69   History of pneumonia    Hyperactive gag reflex    Hyperlipidemia    Obesity, Class III, BMI 40-49.9 (morbid obesity) ([place])    OSA on CPAP    Pneumonia    Port-A-Cath in place 06/28/2019   Sciatica    Vertigo     Past Surgical History:  Procedure Laterality Date   BREAST LUMPECTOMY Left 1610,9604   x 2    CHOLECYSTECTOMY N/A 12/31/2016   Procedure: LAPAROSCOPIC CHOLECYSTECTOMY WITH POSSIBLE INTRAOPERATIVE CHOLANGIOGRAM;  Surgeon: Rolm Bookbinder, MD;  Location: [facility];  Service: General;  Laterality: N/A;   COLONOSCOPY     EYE SURGERY     bil cataract surgery   HIATAL HERNIA REPAIR  2003   Lymph node removal  2008   MASTECTOMY MODIFIED RADICAL Left 01/26/2014   Procedure: LEFT MODIFIED RADICAL MASTECTOMY;  Surgeon: Jamesetta So, MD;  [facility];  Service: General;  Laterality: Left;   PORT-A-CATH REMOVAL  2009 ish   PORTA CATH INSERTION  [id number]   PORTACATH PLACEMENT Right 06/23/2019   Procedure: INSERTION PORT-A-CATH;  Surgeon: Aviva Signs, MD;  [facility];  Service: General;  Laterality: Right;   ROBOTIC ASSISTED TOTAL HYSTERECTOMY WITH BILATERAL SALPINGO OOPHERECTOMY N/A 05/18/2019   Procedure: XI ROBOTIC ASSISTED TOTAL HYSTERECTOMY WITH BILATERAL SALPINGO OOPHORECTOMY;  Surgeon: Lafonda Mosses, MD;  [facility];  Service: Gynecology;  Laterality: N/A;   SENTINEL  NODE BIOPSY N/A 05/18/2019   Procedure: SENTINEL LYMPH NODE BIOPSY;  Surgeon: Lafonda Mosses, MD;  [facility];  Service: Gynecology;  Laterality: N/A;   TOTAL KNEE ARTHROPLASTY Right 2006   TOTAL KNEE ARTHROPLASTY  08/12/2011   Procedure: TOTAL KNEE ARTHROPLASTY;  Surgeon: Arther Abbott, MD;  [facility];  Service: Orthopedics;  Laterality: Left;   TUBAL LIGATION  1980    Family History  Problem Relation Age of Onset   Stroke Father    Heart disease Father    Skin cancer Sister        dx. 30-58; unknown type   Throat cancer Maternal Uncle 75       smoker; voicebox removed   Alzheimer's disease Paternal Grandmother    Heart attack Paternal Grandfather    Alzheimer's disease Maternal Aunt    Heart disease Maternal Uncle    Lung cancer Cousin        smoker   Lung cancer Paternal Aunt        dx. mid-70s; worked at [employer]  Heart attack Daughter        caused by thyroid issues   Heart disease Other    Arthritis Other    Lung disease Other    Cancer Other    Kidney disease Other    Colon cancer Other    Ovarian cancer Other    Endometrial cancer Other     Social History   Socioeconomic History   Marital status: Widowed    Spouse name: Not on file   Number of children: 2   Years of education: 26   Highest education level: Not on file  Occupational History   Occupation: retired  Tobacco Use   Smoking status: Former Smoker    Packs/day: 3.00    Years: 25.00    Pack years: 75.00    Types: Cigarettes    Quit date: 06/30/1997    Years since quitting: 22.7   Smokeless tobacco: Never Used  Vaping Use   Vaping Use: Never used  Substance and Sexual Activity   Alcohol use: Yes    Alcohol/week: 0.0 standard drinks    Comment: 2 glasses wine per month   Drug use: No   Sexual activity: Not Currently    Birth control/protection: Surgical    Comment: tubal  Other Topics Concern   Not on file  Social History  Narrative   Drinks 2-3 cups of coffee a day    Social Determinants of Health   Financial Resource Strain:    Difficulty of Paying Living Expenses: Not on file  Food Insecurity:    Worried About Charity fundraiser in the Last Year: Not on file   [employer] of Food in the Last Year: Not on file  Transportation Needs:    Lack of Transportation (Medical): Not on file   Lack of Transportation (Non-Medical): Not on file  Physical Activity:    Days of Exercise per Week: Not on file   Minutes of Exercise per Session: Not on file  Stress:    Feeling of Stress : Not on file  Social Connections:    Frequency of Communication with Friends and Family: Not on file   Frequency of Social Gatherings with Friends and Family: Not on file   Attends Religious Services: Not on file   Active Member of Clubs or Organizations: Not on file   Attends Archivist Meetings: Not on file   Marital Status: Not on file    Current Medications:  Current Outpatient Medications:    furosemide (LASIX) 40 MG tablet, TAKE 1 TABLET TWICE A DAY, Disp: 180 tablet, Rfl: 3   metoprolol tartrate (LOPRESSOR) 25 MG tablet, TAKE 1 TABLET TWICE A DAY, Disp: 180 tablet, Rfl: 3   albuterol (PROVENTIL) (2.5 MG/3ML) 0.083% nebulizer solution, Take 3 mLs by nebulization 4 (four) times daily as needed for wheezing or shortness of breath. , Disp: , Rfl:    albuterol (VENTOLIN HFA) 108 (90 Base) MCG/ACT inhaler, Inhale 1-2 puffs into the lungs every 6 (six) hours as needed for wheezing or shortness of breath., Disp: , Rfl:    apixaban (ELIQUIS) 5 MG TABS tablet, Take 1 tablet (5 mg total) by mouth 2 (two) times daily., Disp: 180 tablet, Rfl: 3   buPROPion (WELLBUTRIN XL) 300 MG 24 hr tablet, Take 300 mg by mouth daily., Disp: , Rfl:    Calcium Carbonate-Vit D-Min (CALCIUM 1200 PO), Take 1 tablet by mouth daily. , Disp: , Rfl:    magnesium oxide (MAGNESIUM-OXIDE) 400 (241.3 Mg) MG tablet,  Take 2 tablets (800  mg total) by mouth in the morning, at noon, and at bedtime., Disp: 180 tablet, Rfl: 6   omeprazole (PRILOSEC) 20 MG capsule, Take 20 mg by mouth 2 (two) times daily. , Disp: , Rfl:    potassium chloride SA (KLOR-CON) 20 MEQ tablet, Take 20 mEq by mouth 2 (two) times daily., Disp: , Rfl:    pregabalin (LYRICA) 100 MG capsule, Take 1 capsule (100 mg total) by mouth 2 (two) times daily., Disp: 60 capsule, Rfl: 6   rosuvastatin (CRESTOR) 20 MG tablet, Take 20 mg by mouth at bedtime. , Disp: , Rfl:    sertraline (ZOLOFT) 100 MG tablet, Take 100 mg by mouth at bedtime., Disp: , Rfl:  No current facility-administered medications for this visit.  Facility-Administered Medications Ordered in Other Visits:    heparin lock flush 100 unit/mL, 500 Units, Intracatheter, Daily PRN, Derek Jack, MD   sodium chloride flush (NS) 0.9 % injection 10 mL, 10 mL, Intracatheter, PRN, Derek Jack, MD  Review of Systems: Denies appetite changes, fevers, chills, fatigue, unexplained weight changes. Denies hearing loss, neck lumps or masses, mouth sores, ringing in ears or voice changes. Denies cough or wheezing.  Denies shortness of breath. Denies chest pain or palpitations. Denies leg swelling. Denies abdominal distention, pain, blood in stools, constipation, diarrhea, nausea, vomiting, or early satiety. Denies pain with intercourse, dysuria, frequency, hematuria or incontinence. Denies hot flashes, pelvic pain, vaginal bleeding or vaginal discharge.   Denies joint pain, back pain or muscle pain/cramps. Denies itching, rash, or wounds. Denies dizziness, headaches, numbness or seizures. Denies swollen lymph nodes or glands, denies easy bruising or bleeding. Denies anxiety, depression, confusion, or decreased concentration.  Physical Exam: There were no vitals taken for this visit. General: ***Alert, oriented, no acute distress. HEENT: ***Posterior oropharynx clear, sclera  anicteric. Chest: ***Clear to auscultation bilaterally.  ***Port site clean. Cardiovascular: ***Regular rate and rhythm, no murmurs. Abdomen: ***Obese, soft, nontender.  Normoactive bowel sounds.  No masses or hepatosplenomegaly appreciated.  ***Well-healed scar. Extremities: ***Grossly normal range of motion.  Warm, well perfused.  No edema bilaterally. Skin: ***No rashes or lesions noted. Lymphatics: ***No cervical, supraclavicular, or inguinal adenopathy. GU: Normal appearing external genitalia without erythema, excoriation, or lesions.  Speculum exam reveals ***.  Bimanual exam reveals ***.  ***Rectovaginal exam  confirms ___.  Laboratory & Radiologic Studies: ***  Assessment & Plan: Stephanie Jarvis is a 68 y.o. 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  ***  Patient is overall doing well and has completed adjuvant treatment now.  She is NED on exam today.  We discussed signs and symptoms that would be concerning for recurrence of her uterine cancer including vaginal bleeding, discharge, pain, change to bowel function, decreased appetite.  She knows to call the clinic sooner if she develops these symptoms before her next scheduled visit.  Per NCCN and SGO surveillance recommendations, we discussed continued surveillance visits every 3 months for the first couple of years in the setting of high risk disease.  She is following with her medical oncologist and I am happy to treat visits and see her every 6 months.  The patient is thinking she will be moving to [place] sometime after Labor Day.  I offered to make her an appointment in 3 months so that if she is still in town I am able to see her right before she  moves and, in the event that she has already moved, I can help get her referred to a GYN oncologist in [place].  Patient met with genetics after her somatic testing showed  her tumor was MSI high.  It appears that she had testing performed but I cannot find these results in her chart.  I reached out to one of our genetic counselors to help clarify.  ***  *** minutes of total time was spent for this patient encounter, including preparation, face-to-face counseling with the patient and coordination of care, and documentation of the encounter.  Jeral Pinch, MD  Division of Gynecologic Oncology  Department of Obstetrics and Gynecology  [facility]

## 2020-03-29 DIAGNOSIS — E785 Hyperlipidemia, unspecified: Secondary | ICD-10-CM | POA: Diagnosis not present

## 2020-03-29 DIAGNOSIS — I158 Other secondary hypertension: Secondary | ICD-10-CM | POA: Diagnosis not present

## 2020-03-29 DIAGNOSIS — F330 Major depressive disorder, recurrent, mild: Secondary | ICD-10-CM | POA: Diagnosis not present

## 2020-03-29 DIAGNOSIS — Z853 Personal history of malignant neoplasm of breast: Secondary | ICD-10-CM | POA: Diagnosis not present

## 2020-03-29 DIAGNOSIS — R12 Heartburn: Secondary | ICD-10-CM | POA: Diagnosis not present

## 2020-03-29 DIAGNOSIS — Z0000 Encounter for general adult medical examination without abnormal findings: Secondary | ICD-10-CM | POA: Diagnosis not present

## 2020-03-29 DIAGNOSIS — R42 Dizziness and giddiness: Secondary | ICD-10-CM | POA: Diagnosis not present

## 2020-03-29 DIAGNOSIS — E782 Mixed hyperlipidemia: Secondary | ICD-10-CM | POA: Diagnosis not present

## 2020-04-02 ENCOUNTER — Other Ambulatory Visit: Payer: Self-pay | Admitting: Cardiology

## 2020-04-12 ENCOUNTER — Ambulatory Visit (HOSPITAL_COMMUNITY): Payer: Medicare Other | Admitting: Hematology

## 2020-04-12 ENCOUNTER — Inpatient Hospital Stay (HOSPITAL_COMMUNITY): Payer: Medicare Other

## 2020-04-12 DIAGNOSIS — R42 Dizziness and giddiness: Secondary | ICD-10-CM | POA: Diagnosis not present

## 2020-04-12 DIAGNOSIS — I158 Other secondary hypertension: Secondary | ICD-10-CM | POA: Diagnosis not present

## 2020-04-12 DIAGNOSIS — R5383 Other fatigue: Secondary | ICD-10-CM | POA: Diagnosis not present

## 2020-04-12 DIAGNOSIS — F17210 Nicotine dependence, cigarettes, uncomplicated: Secondary | ICD-10-CM | POA: Diagnosis not present

## 2020-04-12 DIAGNOSIS — F330 Major depressive disorder, recurrent, mild: Secondary | ICD-10-CM | POA: Diagnosis not present

## 2020-04-12 DIAGNOSIS — D518 Other vitamin B12 deficiency anemias: Secondary | ICD-10-CM | POA: Diagnosis not present

## 2020-04-12 DIAGNOSIS — Z0000 Encounter for general adult medical examination without abnormal findings: Secondary | ICD-10-CM | POA: Diagnosis not present

## 2020-04-12 DIAGNOSIS — E559 Vitamin D deficiency, unspecified: Secondary | ICD-10-CM | POA: Diagnosis not present

## 2020-04-12 DIAGNOSIS — H900 Conductive hearing loss, bilateral: Secondary | ICD-10-CM | POA: Diagnosis not present

## 2020-04-12 DIAGNOSIS — Z23 Encounter for immunization: Secondary | ICD-10-CM | POA: Diagnosis not present

## 2020-04-12 DIAGNOSIS — Z1389 Encounter for screening for other disorder: Secondary | ICD-10-CM | POA: Diagnosis not present

## 2020-04-12 DIAGNOSIS — E568 Deficiency of other vitamins: Secondary | ICD-10-CM | POA: Diagnosis not present

## 2020-04-21 DIAGNOSIS — E785 Hyperlipidemia, unspecified: Secondary | ICD-10-CM | POA: Diagnosis not present

## 2020-04-21 DIAGNOSIS — Z853 Personal history of malignant neoplasm of breast: Secondary | ICD-10-CM | POA: Diagnosis not present

## 2020-04-21 DIAGNOSIS — I158 Other secondary hypertension: Secondary | ICD-10-CM | POA: Diagnosis not present

## 2020-04-21 DIAGNOSIS — F330 Major depressive disorder, recurrent, mild: Secondary | ICD-10-CM | POA: Diagnosis not present

## 2020-04-21 DIAGNOSIS — R12 Heartburn: Secondary | ICD-10-CM | POA: Diagnosis not present

## 2020-04-21 DIAGNOSIS — D513 Other dietary vitamin B12 deficiency anemia: Secondary | ICD-10-CM | POA: Diagnosis not present

## 2020-04-21 DIAGNOSIS — E559 Vitamin D deficiency, unspecified: Secondary | ICD-10-CM | POA: Diagnosis not present

## 2020-05-01 DIAGNOSIS — C541 Malignant neoplasm of endometrium: Secondary | ICD-10-CM | POA: Diagnosis not present

## 2020-05-01 DIAGNOSIS — C569 Malignant neoplasm of unspecified ovary: Secondary | ICD-10-CM | POA: Diagnosis not present

## 2020-05-08 DIAGNOSIS — C569 Malignant neoplasm of unspecified ovary: Secondary | ICD-10-CM | POA: Diagnosis not present

## 2020-05-08 DIAGNOSIS — K7689 Other specified diseases of liver: Secondary | ICD-10-CM | POA: Diagnosis not present

## 2020-05-08 DIAGNOSIS — C541 Malignant neoplasm of endometrium: Secondary | ICD-10-CM | POA: Diagnosis not present

## 2020-05-16 DIAGNOSIS — J0111 Acute recurrent frontal sinusitis: Secondary | ICD-10-CM | POA: Diagnosis not present

## 2020-05-16 DIAGNOSIS — R051 Acute cough: Secondary | ICD-10-CM | POA: Diagnosis not present

## 2020-05-16 DIAGNOSIS — J398 Other specified diseases of upper respiratory tract: Secondary | ICD-10-CM | POA: Diagnosis not present

## 2020-05-16 DIAGNOSIS — J202 Acute bronchitis due to streptococcus: Secondary | ICD-10-CM | POA: Diagnosis not present

## 2020-05-16 DIAGNOSIS — J0101 Acute recurrent maxillary sinusitis: Secondary | ICD-10-CM | POA: Diagnosis not present

## 2020-05-16 DIAGNOSIS — J028 Acute pharyngitis due to other specified organisms: Secondary | ICD-10-CM | POA: Diagnosis not present

## 2020-05-23 DIAGNOSIS — C569 Malignant neoplasm of unspecified ovary: Secondary | ICD-10-CM | POA: Diagnosis not present

## 2020-05-23 DIAGNOSIS — C541 Malignant neoplasm of endometrium: Secondary | ICD-10-CM | POA: Diagnosis not present

## 2020-05-23 DIAGNOSIS — K769 Liver disease, unspecified: Secondary | ICD-10-CM | POA: Diagnosis not present

## 2020-05-31 ENCOUNTER — Ambulatory Visit (HOSPITAL_COMMUNITY): Payer: Medicare Other | Admitting: Hematology

## 2020-06-16 DIAGNOSIS — I313 Pericardial effusion (noninflammatory): Secondary | ICD-10-CM | POA: Diagnosis not present

## 2020-06-16 DIAGNOSIS — C541 Malignant neoplasm of endometrium: Secondary | ICD-10-CM | POA: Diagnosis not present

## 2020-06-16 DIAGNOSIS — K449 Diaphragmatic hernia without obstruction or gangrene: Secondary | ICD-10-CM | POA: Diagnosis not present

## 2020-06-16 DIAGNOSIS — C569 Malignant neoplasm of unspecified ovary: Secondary | ICD-10-CM | POA: Diagnosis not present

## 2020-06-16 DIAGNOSIS — K761 Chronic passive congestion of liver: Secondary | ICD-10-CM | POA: Diagnosis not present

## 2021-01-09 ENCOUNTER — Encounter (HOSPITAL_COMMUNITY): Payer: Self-pay | Admitting: Hematology

## 2021-01-16 ENCOUNTER — Telehealth: Payer: Medicare Other | Admitting: Adult Health

## 2021-02-06 ENCOUNTER — Other Ambulatory Visit (HOSPITAL_COMMUNITY): Payer: Self-pay | Admitting: Physician Assistant

## 2021-02-20 ENCOUNTER — Other Ambulatory Visit: Payer: Self-pay | Admitting: Cardiology

## 2021-04-25 IMAGING — MG DIGITAL SCREENING UNILAT RIGHT W/ TOMO W/ CAD
8 series · 8 of 24 positions shown · non-contrast
Comparison: Previous exam(s).

CLINICAL DATA: Screening.

EXAM:
DIGITAL SCREENING UNILATERAL RIGHT MAMMOGRAM WITH CAD AND TOMO

[R MLO synth-2D (1 of 2)]
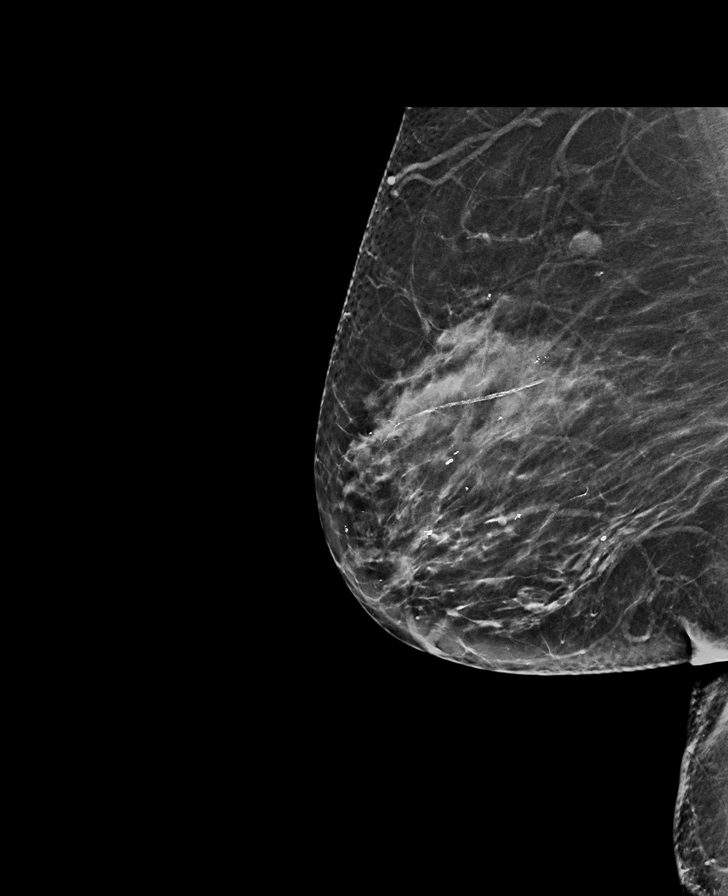

[R MLO synth-2D (2 of 2)]
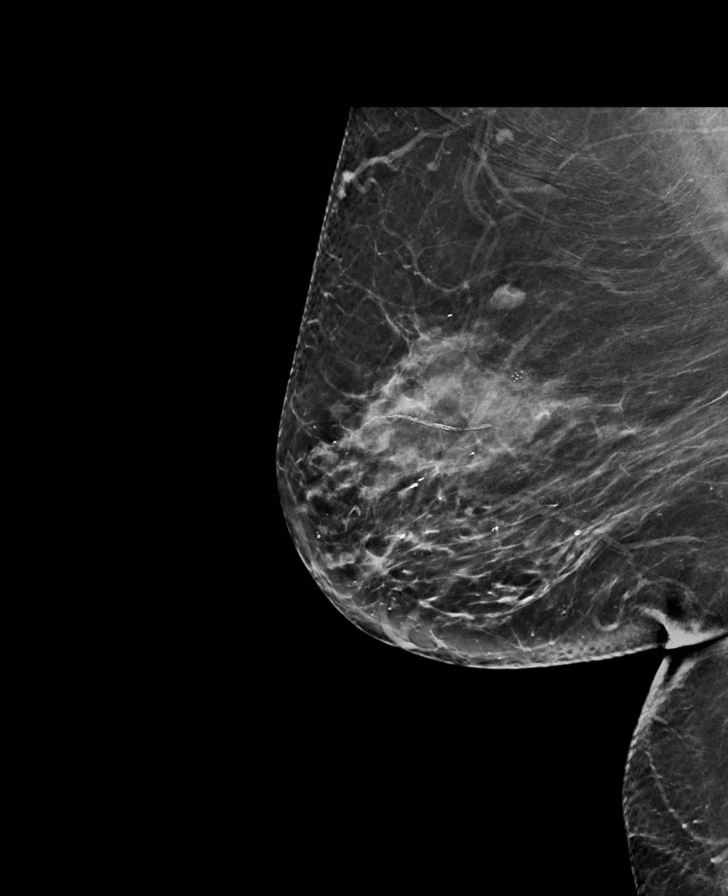

[R CC synth-2D (1 of 2)]
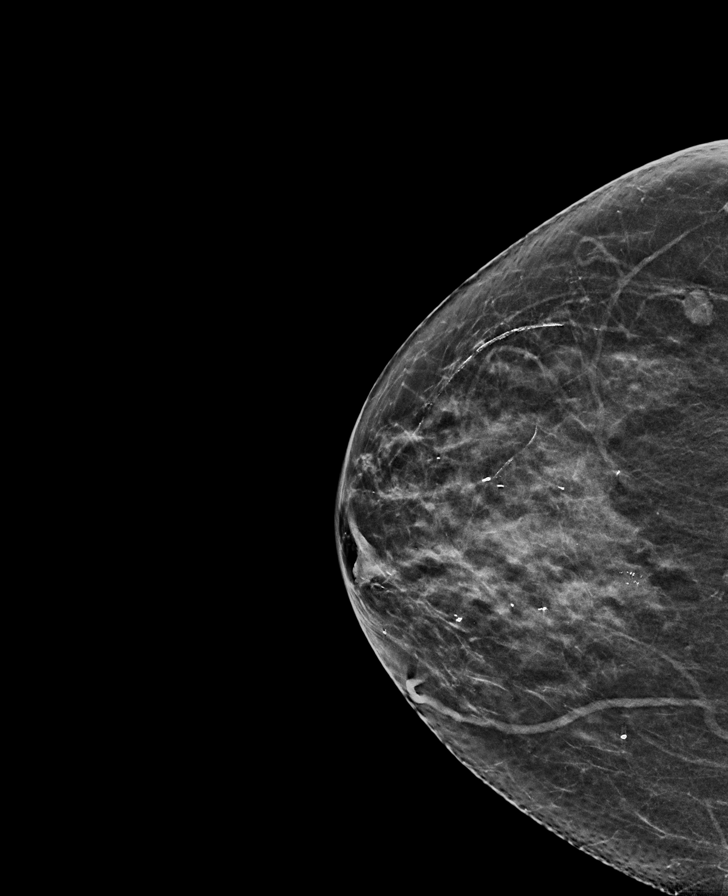

[R CC synth-2D (2 of 2)]
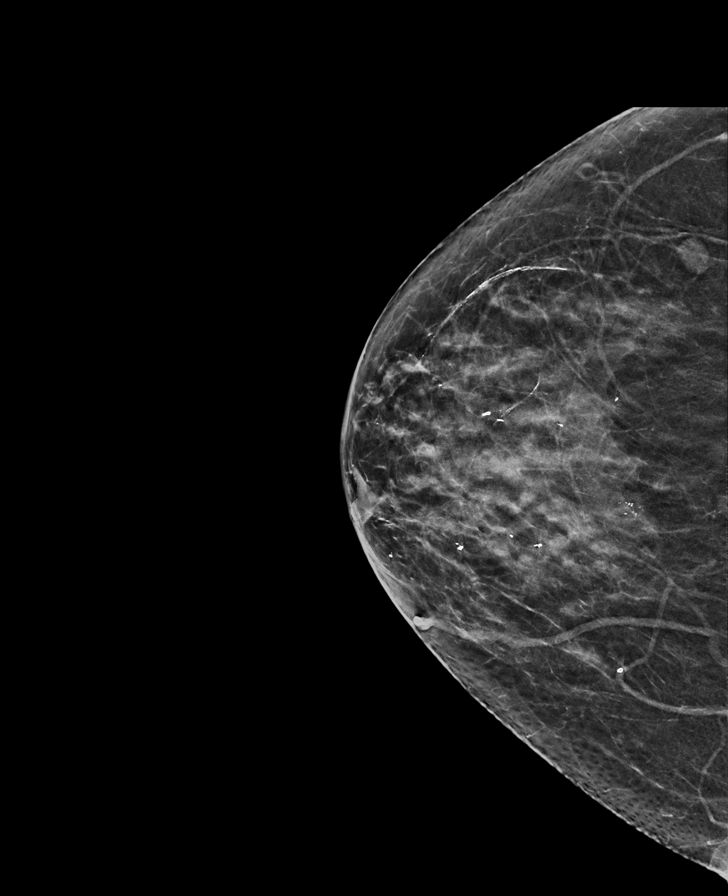

[R MLO tomo (1 of 2) · tomo slice 37/74.0]
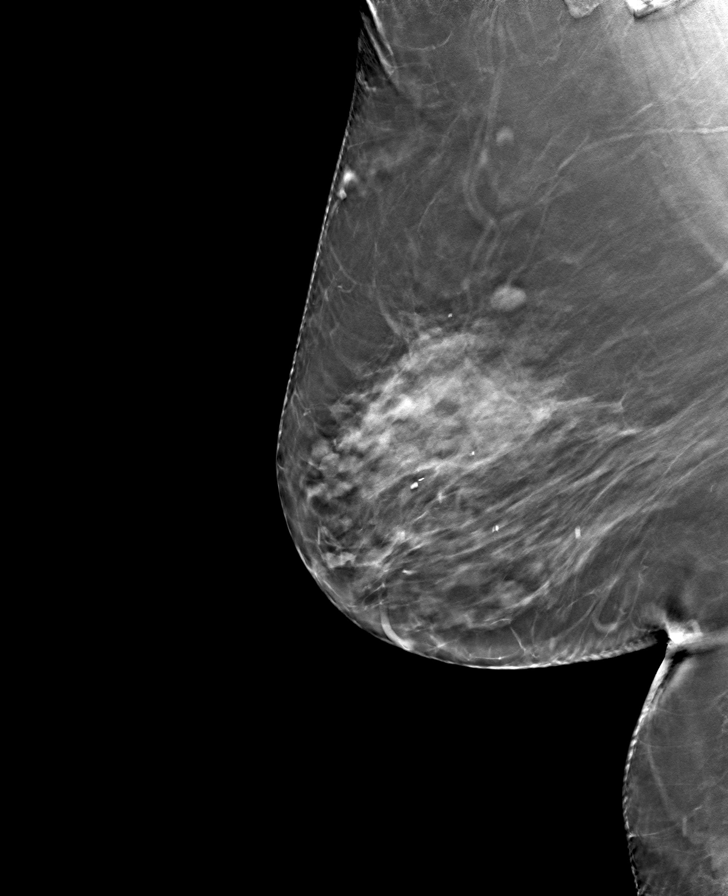

[R CC tomo (1 of 2) · tomo slice 30/59.0]
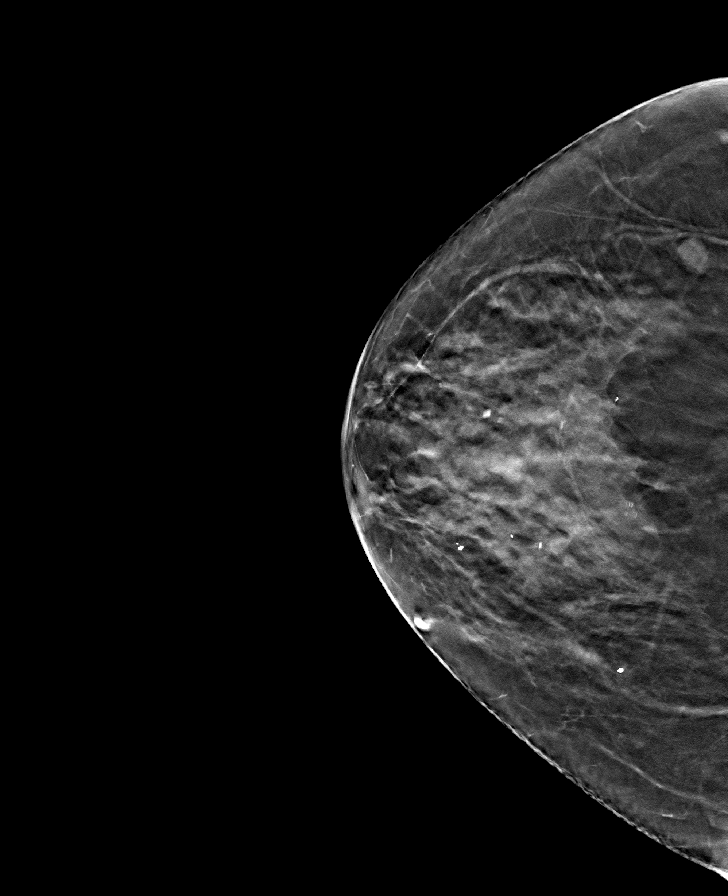

[R MLO tomo (2 of 2) · tomo slice 35/68.0]
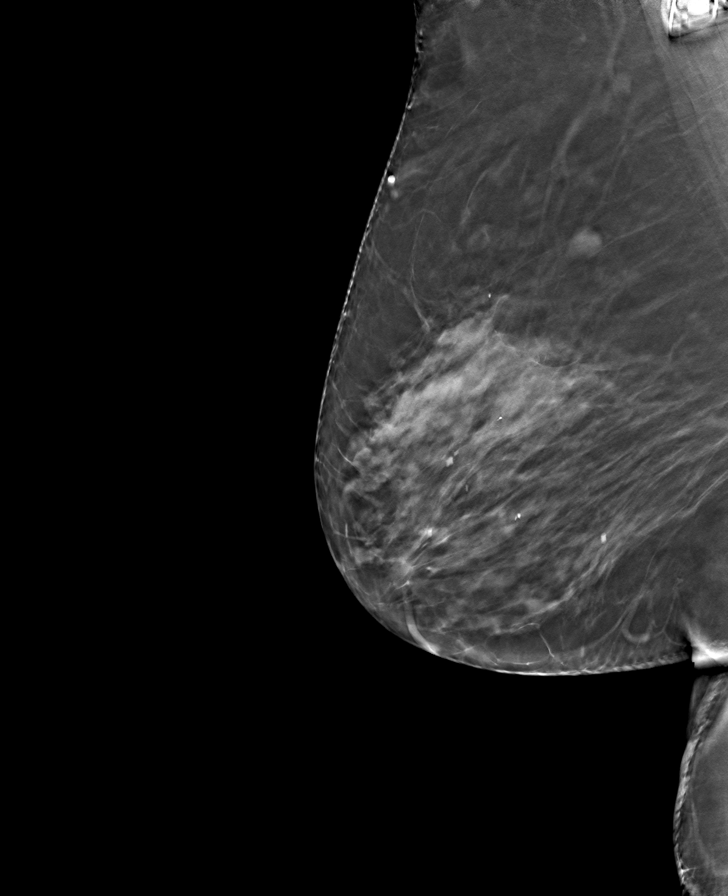

[R CC tomo (2 of 2) · tomo slice 28/55.0]
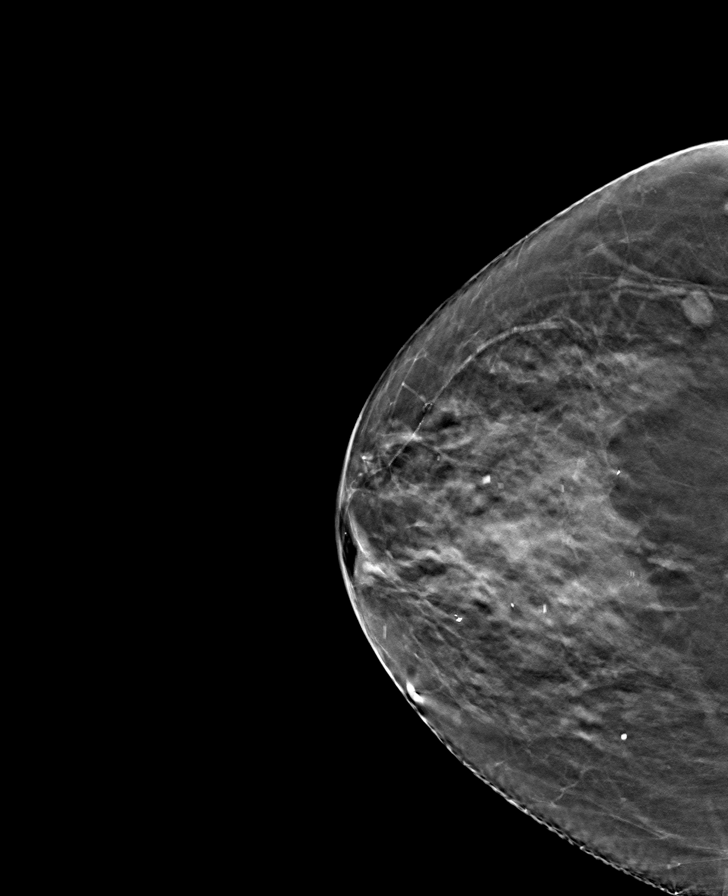

[8 of 24 positions shown; findings below may reference images not displayed]

ACR Breast Density Category c: The breast tissue is heterogeneously
dense, which may obscure small masses.
FINDINGS: The patient has had a left mastectomy. There are no findings
suspicious for malignancy. Images were processed with CAD.
IMPRESSION: No mammographic evidence of malignancy. A result letter of this
screening mammogram will be mailed directly to the patient.

RECOMMENDATION:
Screening mammogram in one year.  (Code:CY-P-D4Y)

BI-RADS CATEGORY  1: Negative.

## 2021-10-10 ENCOUNTER — Encounter (HOSPITAL_COMMUNITY): Payer: Self-pay

## 2021-10-10 NOTE — Progress Notes (Signed)
Most recent office notes, scans and pathology faxed to patient's new [facility] in [place] at [facility]. Faxed to [phone]. ?

## 2021-12-08 ENCOUNTER — Other Ambulatory Visit: Payer: Self-pay | Admitting: Nurse Practitioner
